# Patient Record
Sex: Male | Born: 1940
Health system: Southern US, Community
[De-identification: ages and names within clinical notes are randomized; demographics above are authoritative.]

## PROBLEM LIST (undated history)

## (undated) DIAGNOSIS — K635 Polyp of colon: Secondary | ICD-10-CM

## (undated) DIAGNOSIS — M1990 Unspecified osteoarthritis, unspecified site: Secondary | ICD-10-CM

## (undated) DIAGNOSIS — H269 Unspecified cataract: Secondary | ICD-10-CM

## (undated) DIAGNOSIS — R079 Chest pain, unspecified: Secondary | ICD-10-CM

## (undated) DIAGNOSIS — E119 Type 2 diabetes mellitus without complications: Secondary | ICD-10-CM

## (undated) DIAGNOSIS — H9190 Unspecified hearing loss, unspecified ear: Secondary | ICD-10-CM

## (undated) DIAGNOSIS — E785 Hyperlipidemia, unspecified: Secondary | ICD-10-CM

## (undated) DIAGNOSIS — K449 Diaphragmatic hernia without obstruction or gangrene: Secondary | ICD-10-CM

## (undated) DIAGNOSIS — D126 Benign neoplasm of colon, unspecified: Secondary | ICD-10-CM

## (undated) DIAGNOSIS — I10 Essential (primary) hypertension: Secondary | ICD-10-CM

## (undated) DIAGNOSIS — K589 Irritable bowel syndrome without diarrhea: Secondary | ICD-10-CM

## (undated) DIAGNOSIS — J189 Pneumonia, unspecified organism: Secondary | ICD-10-CM

## (undated) DIAGNOSIS — K210 Gastro-esophageal reflux disease with esophagitis: Secondary | ICD-10-CM

## (undated) DIAGNOSIS — W57XXXA Bitten or stung by nonvenomous insect and other nonvenomous arthropods, initial encounter: Secondary | ICD-10-CM

## (undated) DIAGNOSIS — N200 Calculus of kidney: Secondary | ICD-10-CM

## (undated) HISTORY — DX: Hyperlipidemia, unspecified: E78.5

## (undated) HISTORY — PX: COLONOSCOPY: SHX174

## (undated) HISTORY — DX: Benign neoplasm of colon, unspecified: D12.6

## (undated) HISTORY — DX: Unspecified osteoarthritis, unspecified site: M19.90

## (undated) HISTORY — DX: Calculus of kidney: N20.0

## (undated) HISTORY — PX: FLEXIBLE SIGMOIDOSCOPY: SHX1649

## (undated) HISTORY — DX: Irritable bowel syndrome, unspecified: K58.9

## (undated) HISTORY — DX: Polyp of colon: K63.5

## (undated) HISTORY — DX: Unspecified cataract: H26.9

## (undated) HISTORY — DX: Bitten or stung by nonvenomous insect and other nonvenomous arthropods, initial encounter: W57.XXXA

## (undated) HISTORY — PX: KNEE SURGERY: SHX244

## (undated) HISTORY — DX: Pneumonia, unspecified organism: J18.9

## (undated) HISTORY — DX: Unspecified hearing loss, unspecified ear: H91.90

## (undated) HISTORY — DX: Gastro-esophageal reflux disease with esophagitis: K21.0

## (undated) HISTORY — DX: Type 2 diabetes mellitus without complications: E11.9

## (undated) HISTORY — DX: Essential (primary) hypertension: I10

## (undated) HISTORY — DX: Diaphragmatic hernia without obstruction or gangrene: K44.9

## (undated) HISTORY — PX: TONSILLECTOMY: SUR1361

---

## 1968-06-03 DIAGNOSIS — N200 Calculus of kidney: Secondary | ICD-10-CM

## 1968-06-03 HISTORY — DX: Calculus of kidney: N20.0

## 1994-06-03 HISTORY — PX: ESOPHAGEAL DILATION: SHX303

## 1998-06-09 ENCOUNTER — Other Ambulatory Visit: Admission: RE | Admit: 1998-06-09 | Discharge: 1998-06-09 | Payer: Self-pay | Admitting: Urology

## 2001-06-03 HISTORY — PX: ESOPHAGEAL DILATION: SHX303

## 2002-02-26 ENCOUNTER — Inpatient Hospital Stay (HOSPITAL_COMMUNITY): Admission: EM | Admit: 2002-02-26 | Discharge: 2002-02-28 | Payer: Self-pay | Admitting: Emergency Medicine

## 2002-04-21 ENCOUNTER — Ambulatory Visit (HOSPITAL_COMMUNITY): Admission: RE | Admit: 2002-04-21 | Discharge: 2002-04-21 | Payer: Self-pay | Admitting: Gastroenterology

## 2002-04-21 ENCOUNTER — Encounter: Payer: Self-pay | Admitting: Gastroenterology

## 2003-12-18 ENCOUNTER — Encounter: Admission: RE | Admit: 2003-12-18 | Discharge: 2003-12-18 | Payer: Self-pay | Admitting: Family Medicine

## 2004-04-11 ENCOUNTER — Ambulatory Visit: Payer: Self-pay | Admitting: Cardiology

## 2004-04-25 ENCOUNTER — Ambulatory Visit: Payer: Self-pay | Admitting: Gastroenterology

## 2004-07-09 ENCOUNTER — Ambulatory Visit (HOSPITAL_COMMUNITY): Admission: RE | Admit: 2004-07-09 | Discharge: 2004-07-09 | Payer: Self-pay | Admitting: Internal Medicine

## 2004-07-09 ENCOUNTER — Ambulatory Visit: Payer: Self-pay | Admitting: Internal Medicine

## 2004-07-09 ENCOUNTER — Encounter (INDEPENDENT_AMBULATORY_CARE_PROVIDER_SITE_OTHER): Payer: Self-pay | Admitting: *Deleted

## 2004-07-09 DIAGNOSIS — K2100 Gastro-esophageal reflux disease with esophagitis, without bleeding: Secondary | ICD-10-CM

## 2004-07-09 HISTORY — DX: Gastro-esophageal reflux disease with esophagitis, without bleeding: K21.00

## 2005-01-25 ENCOUNTER — Ambulatory Visit: Payer: Self-pay | Admitting: Internal Medicine

## 2005-02-06 ENCOUNTER — Ambulatory Visit (HOSPITAL_COMMUNITY): Admission: RE | Admit: 2005-02-06 | Discharge: 2005-02-06 | Payer: Self-pay | Admitting: Internal Medicine

## 2005-02-06 ENCOUNTER — Encounter (INDEPENDENT_AMBULATORY_CARE_PROVIDER_SITE_OTHER): Payer: Self-pay | Admitting: Specialist

## 2005-02-06 ENCOUNTER — Ambulatory Visit: Payer: Self-pay | Admitting: Internal Medicine

## 2005-02-19 ENCOUNTER — Ambulatory Visit: Payer: Self-pay | Admitting: Internal Medicine

## 2005-07-12 ENCOUNTER — Ambulatory Visit: Payer: Self-pay | Admitting: Internal Medicine

## 2005-07-24 ENCOUNTER — Ambulatory Visit (HOSPITAL_COMMUNITY): Admission: RE | Admit: 2005-07-24 | Discharge: 2005-07-24 | Payer: Self-pay | Admitting: Internal Medicine

## 2005-07-24 ENCOUNTER — Ambulatory Visit: Payer: Self-pay | Admitting: Internal Medicine

## 2005-08-01 ENCOUNTER — Encounter (INDEPENDENT_AMBULATORY_CARE_PROVIDER_SITE_OTHER): Payer: Self-pay | Admitting: Specialist

## 2005-08-01 ENCOUNTER — Inpatient Hospital Stay (HOSPITAL_COMMUNITY): Admission: RE | Admit: 2005-08-01 | Discharge: 2005-08-06 | Payer: Self-pay | Admitting: Surgery

## 2005-08-01 HISTORY — PX: HEMICOLECTOMY: SHX854

## 2007-02-12 ENCOUNTER — Ambulatory Visit: Payer: Self-pay | Admitting: Internal Medicine

## 2007-09-14 DIAGNOSIS — D126 Benign neoplasm of colon, unspecified: Secondary | ICD-10-CM

## 2007-09-14 DIAGNOSIS — E785 Hyperlipidemia, unspecified: Secondary | ICD-10-CM

## 2007-09-14 DIAGNOSIS — N200 Calculus of kidney: Secondary | ICD-10-CM

## 2007-09-14 DIAGNOSIS — K449 Diaphragmatic hernia without obstruction or gangrene: Secondary | ICD-10-CM | POA: Insufficient documentation

## 2007-09-14 DIAGNOSIS — K219 Gastro-esophageal reflux disease without esophagitis: Secondary | ICD-10-CM

## 2008-04-15 ENCOUNTER — Encounter: Payer: Self-pay | Admitting: Internal Medicine

## 2008-07-15 ENCOUNTER — Ambulatory Visit: Payer: Self-pay | Admitting: Internal Medicine

## 2008-08-09 ENCOUNTER — Ambulatory Visit: Payer: Self-pay | Admitting: Internal Medicine

## 2008-08-25 ENCOUNTER — Encounter: Payer: Self-pay | Admitting: Internal Medicine

## 2010-04-11 ENCOUNTER — Ambulatory Visit: Payer: Self-pay | Admitting: Cardiology

## 2010-04-11 DIAGNOSIS — R9430 Abnormal result of cardiovascular function study, unspecified: Secondary | ICD-10-CM | POA: Insufficient documentation

## 2010-04-11 DIAGNOSIS — I4510 Unspecified right bundle-branch block: Secondary | ICD-10-CM

## 2010-04-18 ENCOUNTER — Telehealth (INDEPENDENT_AMBULATORY_CARE_PROVIDER_SITE_OTHER): Payer: Self-pay

## 2010-04-19 ENCOUNTER — Encounter: Payer: Self-pay | Admitting: Cardiology

## 2010-04-19 ENCOUNTER — Encounter: Payer: Self-pay | Admitting: Cardiovascular Disease

## 2010-04-19 ENCOUNTER — Ambulatory Visit: Payer: Self-pay | Admitting: Cardiology

## 2010-04-19 ENCOUNTER — Encounter (HOSPITAL_COMMUNITY)
Admission: RE | Admit: 2010-04-19 | Discharge: 2010-06-02 | Payer: Self-pay | Source: Home / Self Care | Attending: Cardiology | Admitting: Cardiology

## 2010-04-19 ENCOUNTER — Ambulatory Visit: Payer: Self-pay

## 2010-06-24 ENCOUNTER — Encounter: Payer: Self-pay | Admitting: Family Medicine

## 2010-07-03 NOTE — Progress Notes (Signed)
Summary: Nuc. Pre-Procedure  Phone Note Outgoing Call Call back at [facility] Phone [phone]   Call placed by: Irean Hong, RN,  April 18, 2010 2:07 PM Summary of Call: Left message with information on Myoview Information Sheet (see scanned document for details).      Nuclear Med Background Indications for Stress Test: Evaluation for Ischemia, Abnormal EKG  Indications Comments: New RBBB.   History Comments: 03-07-10:Abnormal GXT (WRFP).     Nuclear Pre-Procedure Cardiac Risk Factors: Family History - CAD, History of Smoking, Lipids, RBBB Height (in): 69

## 2010-07-03 NOTE — Assessment & Plan Note (Signed)
Summary: Cardiology Nuclear Testing  Nuclear Med Background Indications for Stress Test: Evaluation for Ischemia, Abnormal EKG  Indications Comments: New RBBB. Abnormal GXT.  History: GXT, Myocardial Perfusion Study  History Comments: 5-6 yrs. ago MPS:OK per patient; 03/07/10 [id number] (WRFP)   Symptoms Comments: No cardiac complaints   Nuclear Pre-Procedure Cardiac Risk Factors: Family History - CAD, History of Smoking, Hypertension, Lipids, RBBB Caffeine/Decaff Intake: None NPO After: 7:00 PM Lungs: Clear.   IV 0.9% NS with Angio Cath: 20g     IV Site: R Antecubital IV Started by: Stanton Kidney, EMT-P Chest Size (in) 42     Height (in): 69 Weight (lb): 188 BMI: 27.86  Nuclear Med Study 1 or 2 day study:  1 day     Stress Test Type:  Stress Reading MD:  Olga Millers, MD     Referring MD:  Rollene Rotunda, MD Resting Radionuclide:  Technetium 12m Tetrofosmin     Resting Radionuclide Dose:  11 mCi  Stress Radionuclide:  Technetium 81m Tetrofosmin     Stress Radionuclide Dose:  33 mCi   Stress Protocol Exercise Time (min):  11:30 min     Max HR:  134 bpm     Predicted Max HR:  152 bpm  Max Systolic BP: 192 mm Hg     Percent Max HR:  88.16 %     METS: 13.7 Rate Pressure Product:  98119    Stress Test Technologist:  Rea College, CMA-N     Nuclear Technologist:  Doyne Keel, CNMT  Rest Procedure  Myocardial perfusion imaging was performed at rest 45 minutes following the intravenous administration of Technetium 78m Tetrofosmin.  Stress Procedure  The patient exercised for 11:30.  The patient stopped due to fatigue and denied any chest pain.  There were no diagnostic ST-T wave changes, only rare PAC's.  Technetium 55m Tetrofosmin was injected at peak exercise and myocardial perfusion imaging was performed after a brief delay.  QPS Raw Data Images:  Acquisition technically good; normal left ventricular size. Stress Images:  There is decreased uptake in the apex. Rest  Images:  There is decreased uptake in the apex. Subtraction (SDS):  No evidence of ischemia. Transient Ischemic Dilatation:  1.0  (Normal <1.22)  Lung/Heart Ratio:  .28  (Normal <0.45)  Quantitative Gated Spect Images QGS EDV:  120 ml QGS ESV:  52 ml QGS EF:  57 % QGS cine images:  Normal wall motion.   Overall Impression  Exercise Capacity: Excellent exercise capacity. BP Response: Normal blood pressure response. Clinical Symptoms: No chest pain ECG Impression: No significant ST segment change suggestive of ischemia. Overall Impression: Normal stress nuclear study with mild apical thinning but no ischemia.  Appended Document: Cardiology Nuclear Testing Normal.  No further study is needed.  Appended Document: Cardiology Nuclear Testing PT AWARE./CY

## 2010-07-03 NOTE — Assessment & Plan Note (Signed)
Summary: [facility]  Medications Added VITAMIN D 1000 UNIT TABS (CHOLECALCIFEROL) 1 by mouth daily DIOVAN HCT 320-25 MG TABS (VALSARTAN-HYDROCHLOROTHIAZIDE) 1/2 by mouth daily SIMCOR 500-40 MG XR24H-TAB (NIACIN-SIMVASTATIN) 1 by mouth daily ASPIRIN 81 MG  TABS (ASPIRIN) 1 by mouth daily MELATONIN 5 MG TABS (MELATONIN) 1 by mouth daily      Allergies Added: NKDA  Visit Type:  Initial Consult Primary Provider:  Dr. Vernon Prey  CC:  Abnormal ETT.  History of Present Illness: The patient presents for evaluation of an abnormal exercise treadmill test. He has no prior cardiac history but significant cardiovascular risk factors. Recently he had a screening treadmill test. At baseline he was noted to have new right bundle branch block. He did have some baseline T wave inversion as can be expected in the anterior leads. However, peak exercise this did worsen with deepening ST depression and T-wave inversion. He was able to exercise for 9 minutes. There was no chest pain. He otherwise has been active and denies any chest pressure, neck or arm discomfort. He does not report shortness of breath, PND or orthopnea. He has not had palpitations, presyncope or syncope.  Current Medications (verified): 1)  Nexium 40 Mg  Cpdr (Esomeprazole Magnesium) .Marland Kitchen.. 1 Capsule Each Day 30 Minutes Before Meal  Must Have Office Appt For More Refills 2)  Vitamin D 1000 Unit Tabs (Cholecalciferol) .Marland Kitchen.. 1 By Mouth Daily 3)  Diovan Hct 320-25 Mg Tabs (Valsartan-Hydrochlorothiazide) .... 1/2 By Mouth Daily 4)  Simcor 500-40 Mg Xr24h-Tab (Niacin-Simvastatin) .Marland Kitchen.. 1 By Mouth Daily 5)  Aspirin 81 Mg  Tabs (Aspirin) .Marland Kitchen.. 1 By Mouth Daily 6)  Melatonin 5 Mg Tabs (Melatonin) .Marland Kitchen.. 1 By Mouth Daily  Allergies (verified): No Known Drug Allergies  Past History:  Past Medical History: NEPHROLITHIASIS (ICD-592.0) GERD (ICD-530.81) DYSLIPIDEMIA (ICD-272.4)  TUBULOVILLOUS ADENOMA, COLON (ICD-211.3) HIATAL HERNIA  (ICD-553.3) ESOPHAGITIS, REFLUX (ICD-530.11)  Past Surgical History: Reviewed history from 09/14/2007 and no changes required. Tonsillectomy Laparoscopic Resection of colon polyp with Right Hemicolectomy 3/07 Esophageal dilation 1996, 2003  Family History:  His brother had CABG in his 30s died 30.  Father CVA age 8.    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 He has not used tobacco products for the past 20 years. He reports no excessive use of alcohol.  Review of Systems       As stated in the HPI and negative for all other systems.   Vital Signs:  Patient profile:   70 year old male Height:      69 inches Weight:      191 pounds BMI:     28.31 Pulse rate:   55 / minute Resp:     16 per minute BP sitting:   126 / 60  (right arm)  Vitals Entered By: Marrion Coy, CNA (April 11, 2010 2:44 PM)  Nutrition Counseling: Patient's BMI is greater than 25 and therefore counseled on weight management options.  Physical Exam  General:  Well developed, well nourished, in no acute distress. Head:  normocephalic and atraumatic Eyes:  PERRLA/EOM intact; conjunctiva and lids normal. Mouth:  Teeth, gums and palate normal. Oral mucosa normal. Neck:  Neck supple, no JVD. No masses, thyromegaly or abnormal cervical nodes. Chest Wall:  no deformities  Lungs:  Clear bilaterally to auscultation and percussion. Abdomen:  Bowel sounds positive; abdomen soft and non-tender without masses, organomegaly, or hernias noted. No hepatosplenomegaly. Msk:  Back normal, normal  gait. Muscle strength and tone normal. Extremities:  No clubbing or cyanosis. Neurologic:  Alert and oriented x 3. Skin:  Intact without lesions or rashes. Cervical Nodes:  no significant adenopathy Axillary Nodes:  no significant adenopathy Inguinal Nodes:  no significant adenopathy Psych:  Normal affect.   Detailed Cardiovascular Exam  Neck    Carotids: Carotids full and  equal bilaterally without bruits.      Neck Veins: Normal, no JVD.    Heart    Inspection: no deformities or lifts noted.      Palpation: normal PMI with no thrills palpable.      Auscultation: regular rate and rhythm, S1, S2 without murmurs, rubs, gallops, or clicks.    Vascular    Abdominal Aorta: no palpable masses, pulsations, or audible bruits.      Femoral Pulses: normal femoral pulses bilaterally.      Pedal Pulses: normal pedal pulses bilaterally.      Radial Pulses: normal radial pulses bilaterally.      Peripheral Circulation: no clubbing, cyanosis, or edema noted with normal capillary refill.     EKG  Procedure date:  04/11/2010  Findings:      Sinus rhythm, rate 55, right bundle branch block, she ST-T wave changes  Impression & Recommendations:  Problem # 1:  CARDIOVASCULAR FUNCTION STUDY, ABNORMAL (ICD-794.30) Patient does have abnormal study as described. I cannot exclude obstructive coronary disease based on this study. Therefore, perfusion imaging is indicated. Orders: EKG w/ Interpretation (93000) Nuclear Stress Test (Nuc Stress Test)  Problem # 2:  DYSLIPIDEMIA (ICD-272.4) I reviewed his lipid profile. His HDL is low. Dr. Christell Constant had consider niacin and Crestor to replace the SimCor. This would be appropriate and I would defer to his management.  Problem # 3:  RBBB (ICD-426.4) This will be evaluated as above. He has no syncope or other symptoms. No further evaluation would be warranted.  Patient Instructions: 1)  Your physician recommends that you schedule a follow-up appointment after stress test 2)  Your physician recommends that you continue on your current medications as directed. Please refer to the Current Medication list given to you today. 3)  Your physician has requested that you have an exercise stress myoview.  For further information please visit [email or web address]  Please follow instruction sheet, as given.

## 2010-10-16 NOTE — Assessment & Plan Note (Signed)
[facility]                         GASTROENTEROLOGY OFFICE NOTE   [name], Joe                        MRN:          [id number]  DATE:02/12/2007                            DOB:          April 01, 1941    CHIEF COMPLAINT:  Followup of colon polyps.   PROBLEMS:  1. Adenomatous colon polyps, one tubulovillous adenoma unresectable by      colonoscopy, eventually had laparoscopic resection March 2007 (Dr.      Ezzard Standing) with right hemicolectomy.  2. Dyslipidemia.  3. Gastroesophageal reflux disease.  4. Nephrolithiasis.  5. Tonsillectomy.  6. Prior esophageal dilation in 1996 and 2003.  7. Hiatal hernia.   [name] though he was due for a colonoscopy.  He comes with a normal  CBC and C-MET, though his glucose is 107.  These were labs from Dr.  Kathi Der office in August 2008.  He really does not need another  colonoscopy until 2010, February 2010, which would be 3 years from the  time of his resection of his tubulovillous adenoma.  This is standard  followup for colon polyps of this type.  This is explained to the  patient and his wife and they are in agreement.   Note, weight 196 pounds, py 60, blood pressure 122/72.     Iva Boop, MD,FACG  Electronically Signed    CEG/MedQ  DD: 02/12/2007  DT: 02/12/2007  Job #: [id number]   cc:   Ernestina Penna, M.D.  Sandria Bales. Ezzard Standing, M.D.

## 2010-10-19 NOTE — H&P (Signed)
NAME:  Joe [name], Joe [name]                           ACCOUNT NO.:  [id number]   MEDICAL RECORD NO.:  [id number]                   PATIENT TYPE:  INP   LOCATION:  3702                                 FACILITY:  [facility]   PHYSICIAN:  Gerrit Friends. Dietrich Pates, M.D. [facility]        DATE OF BIRTH:  [birth year]   DATE OF ADMISSION:  02/26/2002  DATE OF DISCHARGE:  02/28/2002                                HISTORY & PHYSICAL   <REFERRING PHYSICIAN/>  Ernestina Penna, M.D.   HISTORY OF PRESENT ILLNESS:  The patient is a 70 year old gentleman with no  prior cardiac history who was referred for evaluation of chest pain. The  patient has never previously been evaluated by a cardiologist. He has had  stress tests on a number of occasions, most recently approximately two years  ago, in conjunction with annual physical examinations. This study apparently  was negative. He  has a remote history of cigarette smoking. He has a  significant family history for coronary artery disease, but other than age  and sex, no personal risk factors.   He has noted prolonged episodes of chest tightness with onset at rest,  occurring typically in the afternoon and persisting through the evening  until he  goes to sleep. There is no relationship to body position or  eating. He has tried to take a Zantac without improvement. The discomfort is  relatively mild, but does interfere with sleep at times. It was of slightly  greater intensity today, prompting him  to call  his physician; he expected  a referral for GI reevaluation.   PAST MEDICAL HISTORY:  Notable for multiple gastrointestinal problems,  including:  1. Gastritis.  2. Duodenitis.  3. IBS.  4. Umbilical hernia.  5. He has had nephrolithiasis with a transurethral removal.  6. There is a history of dyshydrosis.  7. DJD of the  spine.  8. Tinnitus.  9. BPH and increased PSA with negative biopsy.  10.      Impotence.   CURRENT MEDICATIONS:  1. Aspirin 81 mg q.d.  2.  Viagra p.r.n.  3. Ranitidine 300 mg q.d. p.r.n.  4. Benadryl 25 mg q.h.s. p.r.n.   ALLERGIES:  He reports no allergies.   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 He has not used tobacco products for the past 20 years. He reports  no excessive use of alcohol.   FAMILY HISTORY:  His brother had CABG in his 6s.   REVIEW OF SYSTEMS:  Notable for GERD.  He has symptoms. He notes that his  current chest discomfort is different than the one that he attributes to GI  etiology. All other systems negative.   PHYSICAL EXAMINATION:  GENERAL:  A pleasant, well-appearing gentleman.  VITAL SIGNS:  Temperature 97, heart rate 60 and regular, respirations 16,  blood pressure 150/75, O2 saturation 100% on room air.  HEENT:  Sclerae anicteric.  NECK:  No jugular venous  distention. No carotid bruits.  ENDOCRINE:  No thyromegaly.  HEMATOPOIETIC:  No adenopathy.  SKIN:  No significant lesions.  CHEST:  Few left basilar rales, mostly clear with cough.  CARDIAC:  Normal first and second heart sounds; fourth no significant murmur  appreciated.  ABDOMEN:  Soft, nontender; no bruits; no organomegaly.  NEUROMUSCULAR:  Symmetric strength and tone.  EXTREMITIES:  Normal distal pulses; no edema.   LABORATORY DATA:  Chest x-ray:  NAD.  An EKG:  Normal sinus rhythm; borderline left atrial abnormality; otherwise  normal.   Initial laboratory studies all normal, including CPK and troponin.   IMPRESSION:  A middle aged gentleman with relatively low cardiovascular risk  presents with atypical chest discomfort, a normal EKG and a negative exam.  The risk for coronary disease is relatively low. We will plan to proceed  with a stress Cardiolite study tomorrow. If negative, the patient will be  discharged to be treated empirically with increased dose of acid suppressant  medication. If symptoms persist, repeat GI evaluation will be requested.                                                Gerrit Friends. Dietrich Pates, M.D. [facility]    RMR/MEDQ  D:  02/26/2002  T:  03/01/2002  Job:  [id number]

## 2010-10-19 NOTE — Discharge Summary (Signed)
NAME:  Joe [name], Joe [name]                           ACCOUNT NO.:  [id number]   MEDICAL RECORD NO.:  [id number]                   PATIENT TYPE:  INP   LOCATION:  3702                                 FACILITY:  [facility]   PHYSICIAN:  Gerrit Friends. Dietrich Pates, M.D. [facility]        DATE OF BIRTH:  [birth year]   DATE OF ADMISSION:  02/26/2002  DATE OF DISCHARGE:  02/28/2002                           DISCHARGE SUMMARY - REFERRING   HISTORY OF PRESENT ILLNESS:  The patient is a 70 year old white male who was  referred from his primary MD's office for evaluation of new onset chest  discomfort.  He describes a three-week history of chest tightness which  occurs at rest.  He denies any exertion-related symptoms.  He has not had  any recent decreased energy or dyspnea on exertion.  Symptoms do not radiate  nor are they associated with shortness of breath or diaphoresis.  His  symptoms persist for hours.  He takes Zantac without affect.  He does feel  it is exacerbated by inspiration, movement, or change in position.  He feels  it is different from his heartburn symptoms.   History is notable for gastritis, duodenitis, nephrolithiasis, irritable  bowel syndrome, dyshydrosis, spinal DJD, tinnitus, BPH with increased PSA  and negative biopsy, impotence, umbilical hernia.  He quit smoking  approximately 18 years ago.  He also has a history of hyperlipidemia and  family history of coronary disease.   LABORATORY DATA:  CK and troponin negative for myocardial infarction.  Admission hemoglobin 14.5, hematocrit 41.0, normal indices, platelets 162,  WBC 5.9.  PT 13.3, PTT 29.  Sodium 138, potassium 4.1, BUN 12, creatinine  1.0.  Normal LFTs.  Fasting lipids showed a total cholesterol of 168,  triglycerides elevated at 423, HDL low at 24, LDL not calculated.  TSH 1.78.   Chest x-ray:  No active disease.   KEG showed normal sinus rhythm, no acute changes.   HOSPITAL COURSE:  The patient was admitted to the unit 3700.   Overnight, he  did not have any further chest discomfort and ruled out for myocardial  infarction.  Initial plan was to consider outpatient stress test; however,  with his risk factors, Dr. Graciela Husbands felt that he should remain for a stress  Cardiolite.  This was performed on 02/28/2002.  He ambulated for a total of  10 minutes and 34 seconds utilizing a Bruce protocol, achieving greater than  85% predicted maximum heart rate.  He complained of leg fatigue but no chest  discomfort.  EKG did show approximately 1 mm ST segment depression  laterally; however, imaging showed an EF of 59%  and no ischemia.  After  review, Dr. Graciela Husbands felt the patient could be discharged home.   DISCHARGE DIAGNOSES:  1. Noncardiac chest discomfort.  2. Hyperlipidemia.  3. History as previously.   DISPOSITION:  He is discharged home.   DISCHARGE MEDICATIONS:  He received a new prescription for  Protonix 40 mg  and was instructed to take this every day.  He was encouraged to continue  his coated aspirin 81 q.d. and Benadryl and Viagra as needed.   ACTIVITY:  Not restricted.   DIET:  He was asked to maintain low-fat, low-salt, and low-cholesterol diet  particularly low carbohydrates, avoiding bread and sugars.   FOLLOW UP:  He will follow up with Dr. Christell Constant in the next couple of weeks.  He was instructed to discuss with Dr. Christell Constant consideration of a weight loss  diet program as well as a prescription for hypertriglyceridemia and low HDL.      Joellyn Rued, P.A. [facility]                    Gerrit Friends. Dietrich Pates, M.D. [facility]    EW/MEDQ  D:  02/28/2002  T:  03/02/2002  Job:  [id number]   cc:   Ernestina Penna, M.D.  [address] [place]  [place] [address]  Fax: [phone]

## 2010-10-19 NOTE — Op Note (Signed)
Joe [name], Joe [name]                 ACCOUNT NO.:  [id number]   MEDICAL RECORD NO.:  [id number]          PATIENT TYPE:  INP   LOCATION:  5736                         FACILITY:  [facility]   PHYSICIAN:  Sandria Bales. Ezzard Standing, M.D.  DATE OF BIRTH:  May 16, 1941   DATE OF PROCEDURE:  08/01/2005  DATE OF DISCHARGE:                                 OPERATIVE REPORT   PREOPERATIVE DIAGNOSIS:  Hepatic flexure colonic polyp.   POSTOPERATIVE DIAGNOSIS:  Hepatic flexure colonic polyp.   OPERATION PERFORMED:  Laparoscopically assisted right hemicolectomy.   SURGEON:  Sandria Bales. Ezzard Standing, M.D.   ASSISTANT:  Currie Paris, M.D.   ANESTHESIA:  General endotracheal.   ESTIMATED BLOOD LOSS:  100 to 150 mL.   DRAINS:  None.   INDICATIONS FOR PROCEDURE:  Mr. Law is a 70 year old white male who  underwent a colonoscopy by Dr. Leone Payor.  He was found to have a polyp at his  hepatic flexure.  The polyp was not felt to be amenable to colonoscopic  resection and it is felt that the patient would be best served with a  segmental colectomy.   Dr. Leone Payor has both marked the polyp with dye and he has done a colonoscopy  and obtained a KUB identifying the polyp at the tip of the colonoscope at  the hepatic flexure.   The patient has completed a mechanical and antibiotic bowel prep and now  comes for a laparoscopically assisted right hemicolectomy.  The indications  and potential complications were explained to the patient.  Potential  complications include but are not limited to bleeding, infection, leak from  the bowel, possibility of invasive carcinoma.   DESCRIPTION OF PROCEDURE:  The patient was placed in a supine position,  given a general endotracheal anesthetic, his left arm was tucked by his  side.  A Foley catheter was then placed.  He was given 1 gm of Cefoxitin at  initiation of the procedure.  He had an orogastric tube placed.   The abdomen was shaved, prepped with Betadine solution and sterilely  draped.  I accessed the abdominal cavity through an infraumbilical  hernia which we  repaired during the surgery.  A 10 mm 0 degree laparoscope was inserted  through a 12 mm Hasson trocar and this Hasson trocar secured with a 0 Vicryl  suture.  Four additional trocars were placed, right upper quadrant, right  lower quadrant, left upper quadrant, left lower quadrant, 10 mm trocars.   Attention was first turned to the appendix.  I mobilized the cecum and right  colon. I stayed well anterior to the ureter, never did really see the ureter  but I thought I was mobilizing the colon up anterior to that.  I got up to  the duodenal sweep which was identified and left behind.  I then turned the  corner.  I actually saw evidence of the dye in the wall of the colon right  at the hepatic flexure.  I was able to mobilize the colon coming back over  the duodenum.  I thought I mobilized at least 10 to  15 cm of the right  transverse colon, so I thought I had pretty good mobilization of the  terminal ileum, right colon, right transverse colon.  I then removed the  umbilical port and made a midline incision, used the Gel-Port attachment as  a wound protector and I pulled the right colon and the transverse colon  through this incision.   I did have a small bleed up over the pancreas.  I was able to identify this,  localize it.  I put a stitch in it and placed some metal clips in this  bleeding area.  I then divided the terminal ileum with an Endo GIA stapler  #70.  I divided the right transverse colon with a 70 GIA stapler.  I thought  I was a good 5 to 8 cm proximal to where the dye was.  I then removed the  right colon using Kelly clamps and tying the mesentery with 2-0 silk  sutures.   After the entire colon was out, I then opened the colon on the bedside table  and identified a flat polyp which was maybe 2.5 to 3 cm in size at the  hepatic flexure as marked by Dr. Leone Payor.  I think we did get in our   specimen the suspicious mass out.  I then did a stapled side-to-side  anastomosis with a 70 GIA stapler.  I closed the enterotomy with interrupted  2-0 silk sutures.  I placed a stitch at __________ , tried to close the  mesentery down to the duodenum bringing the terminal ileum together with the  right transverse colon.   After doing the side-to-side stapled anastomosis, I closed the enterotomy  with interrupted 2-0 silk sutures.  Again I closed the mesentery with 2-0  silk sutures.  I had a wide open anastomosis which was probably 4 or 5 cm in  diameter.  There was no evidence of any compromise of the bowel or no  evidence of any leaking.  I buttressed this with some two silk sutures and  then buried this under the omentum.   I then reduced the anastomosis back into the abdominal cavity.  I irrigated  the abdomen with about 3 or 4 L of saline and saw no significant bleeding.  I then closed the abdominal wound with two running #1 PDS sutures.  I threw  in some interrupted #1 PDS sutures also.  After complete closure of the  abdominal wall, I then stapled the skin.  Again I had gone through this  hernia defect at the umbilicus, so therefore, I repaired the hernia by  definition in closing the abdominal cavity.   I then placed a 5 mm scope in again. I irrigated out the abdomen with  another liter of saline, so no evidence of any active bleeding, the  anastomosis lay well on the right side of the colon.  I covered this with  omentum.  I then removed the 5 mm trocars under direct visualization.  There  was no bleeding at any trocar site.  Again the skin at each trocar site was  closed with a skin staple as was the midline incision.  The wound was then  sterilely dressed.   The patient was transported to recovery room in good condition.  Sponge and needle counts were correct at the end of this case. Specimen was sent to  pathology.      Sandria Bales. Ezzard Standing, M.D.  Electronically  Signed     DHN/MEDQ  D:  08/01/2005  T:  08/02/2005  Job:  [id number]   cc:   Iva Boop, M.D. [facility], [facility]  [address] [place], [place] [address]   Ernestina Penna, M.D.  Fax: [phone]

## 2011-01-08 ENCOUNTER — Encounter: Payer: Self-pay | Admitting: Cardiology

## 2011-07-29 DIAGNOSIS — E785 Hyperlipidemia, unspecified: Secondary | ICD-10-CM | POA: Diagnosis not present

## 2011-07-29 DIAGNOSIS — E559 Vitamin D deficiency, unspecified: Secondary | ICD-10-CM | POA: Diagnosis not present

## 2011-07-29 DIAGNOSIS — I10 Essential (primary) hypertension: Secondary | ICD-10-CM | POA: Diagnosis not present

## 2011-07-29 DIAGNOSIS — N400 Enlarged prostate without lower urinary tract symptoms: Secondary | ICD-10-CM | POA: Diagnosis not present

## 2011-07-29 DIAGNOSIS — R109 Unspecified abdominal pain: Secondary | ICD-10-CM | POA: Diagnosis not present

## 2011-10-16 DIAGNOSIS — K219 Gastro-esophageal reflux disease without esophagitis: Secondary | ICD-10-CM | POA: Diagnosis not present

## 2011-10-16 DIAGNOSIS — E559 Vitamin D deficiency, unspecified: Secondary | ICD-10-CM | POA: Diagnosis not present

## 2011-10-16 DIAGNOSIS — K449 Diaphragmatic hernia without obstruction or gangrene: Secondary | ICD-10-CM | POA: Diagnosis not present

## 2011-10-16 DIAGNOSIS — I10 Essential (primary) hypertension: Secondary | ICD-10-CM | POA: Diagnosis not present

## 2011-10-16 DIAGNOSIS — E785 Hyperlipidemia, unspecified: Secondary | ICD-10-CM | POA: Diagnosis not present

## 2011-10-18 DIAGNOSIS — Z125 Encounter for screening for malignant neoplasm of prostate: Secondary | ICD-10-CM | POA: Diagnosis not present

## 2011-11-05 DIAGNOSIS — R7989 Other specified abnormal findings of blood chemistry: Secondary | ICD-10-CM | POA: Diagnosis not present

## 2012-01-08 DIAGNOSIS — I10 Essential (primary) hypertension: Secondary | ICD-10-CM | POA: Diagnosis not present

## 2012-01-08 DIAGNOSIS — M545 Low back pain: Secondary | ICD-10-CM | POA: Diagnosis not present

## 2012-02-07 DIAGNOSIS — I10 Essential (primary) hypertension: Secondary | ICD-10-CM | POA: Diagnosis not present

## 2012-02-07 DIAGNOSIS — E559 Vitamin D deficiency, unspecified: Secondary | ICD-10-CM | POA: Diagnosis not present

## 2012-02-07 DIAGNOSIS — E039 Hypothyroidism, unspecified: Secondary | ICD-10-CM | POA: Diagnosis not present

## 2012-02-07 DIAGNOSIS — E785 Hyperlipidemia, unspecified: Secondary | ICD-10-CM | POA: Diagnosis not present

## 2012-02-07 DIAGNOSIS — Z125 Encounter for screening for malignant neoplasm of prostate: Secondary | ICD-10-CM | POA: Diagnosis not present

## 2012-03-10 DIAGNOSIS — R972 Elevated prostate specific antigen [PSA]: Secondary | ICD-10-CM | POA: Diagnosis not present

## 2012-03-10 DIAGNOSIS — C61 Malignant neoplasm of prostate: Secondary | ICD-10-CM | POA: Diagnosis not present

## 2012-03-10 DIAGNOSIS — Z23 Encounter for immunization: Secondary | ICD-10-CM | POA: Diagnosis not present

## 2012-03-10 DIAGNOSIS — N401 Enlarged prostate with lower urinary tract symptoms: Secondary | ICD-10-CM | POA: Diagnosis not present

## 2012-03-10 DIAGNOSIS — I861 Scrotal varices: Secondary | ICD-10-CM | POA: Diagnosis not present

## 2012-06-10 DIAGNOSIS — H25019 Cortical age-related cataract, unspecified eye: Secondary | ICD-10-CM | POA: Diagnosis not present

## 2012-06-10 DIAGNOSIS — H2510 Age-related nuclear cataract, unspecified eye: Secondary | ICD-10-CM | POA: Diagnosis not present

## 2012-07-07 DIAGNOSIS — B078 Other viral warts: Secondary | ICD-10-CM | POA: Diagnosis not present

## 2012-07-14 DIAGNOSIS — M2040 Other hammer toe(s) (acquired), unspecified foot: Secondary | ICD-10-CM | POA: Diagnosis not present

## 2012-07-14 DIAGNOSIS — M79609 Pain in unspecified limb: Secondary | ICD-10-CM | POA: Diagnosis not present

## 2012-07-14 DIAGNOSIS — L851 Acquired keratosis [keratoderma] palmaris et plantaris: Secondary | ICD-10-CM | POA: Diagnosis not present

## 2012-08-06 DIAGNOSIS — E559 Vitamin D deficiency, unspecified: Secondary | ICD-10-CM | POA: Diagnosis not present

## 2012-08-06 DIAGNOSIS — E291 Testicular hypofunction: Secondary | ICD-10-CM | POA: Diagnosis not present

## 2012-08-06 DIAGNOSIS — R5381 Other malaise: Secondary | ICD-10-CM | POA: Diagnosis not present

## 2012-08-06 DIAGNOSIS — E785 Hyperlipidemia, unspecified: Secondary | ICD-10-CM | POA: Diagnosis not present

## 2012-08-06 DIAGNOSIS — R5383 Other fatigue: Secondary | ICD-10-CM | POA: Diagnosis not present

## 2012-08-06 DIAGNOSIS — F329 Major depressive disorder, single episode, unspecified: Secondary | ICD-10-CM | POA: Diagnosis not present

## 2012-08-06 DIAGNOSIS — I10 Essential (primary) hypertension: Secondary | ICD-10-CM | POA: Diagnosis not present

## 2012-08-24 ENCOUNTER — Ambulatory Visit (INDEPENDENT_AMBULATORY_CARE_PROVIDER_SITE_OTHER): Payer: Medicare Other | Admitting: Family Medicine

## 2012-08-24 ENCOUNTER — Encounter: Payer: Self-pay | Admitting: Family Medicine

## 2012-08-24 VITALS — BP 134/70 | HR 58 | Temp 97.1°F | Wt 194.0 lb

## 2012-08-24 DIAGNOSIS — S91309A Unspecified open wound, unspecified foot, initial encounter: Secondary | ICD-10-CM | POA: Diagnosis not present

## 2012-08-24 DIAGNOSIS — S91332A Puncture wound without foreign body, left foot, initial encounter: Secondary | ICD-10-CM

## 2012-08-24 MED ORDER — CEPHALEXIN 500 MG PO CAPS: 500.0000 mg | ORAL_CAPSULE | Freq: Three times a day (TID) | ORAL | Status: DC

## 2012-08-24 NOTE — Patient Instructions (Signed)
Continue continue to keep foot inspected and return to clinic if worsening redness Sent foot warm salty water Call me in 2 days for progress

## 2012-08-24 NOTE — Progress Notes (Signed)
  Subjective:    [name], male    DOB: 1940-11-12, 72 y.o.   MRN: [id number]  HPI Patient stepped on a nail 48 hours ago in the woods. Not sure of when last tetanus shot was given foot has been sore since incident occurred   Review of Systems  Skin: Positive for wound (bottom L foot with redness).       Objective:   Physical Exam Examining foot just reveals puncture wound with minimal redness and with minimal swelling       Assessment & Plan:  1. Puncture wound of foot, left, initial encounter  - cephALEXin (KEFLEX) 500 MG capsule; Take 1 capsule (500 mg total) by mouth 3 (three) times daily.  Dispense: 30 capsule; Refill: 1

## 2012-09-02 ENCOUNTER — Ambulatory Visit: Payer: Self-pay | Admitting: Family Medicine

## 2012-09-22 ENCOUNTER — Ambulatory Visit (INDEPENDENT_AMBULATORY_CARE_PROVIDER_SITE_OTHER): Payer: Medicare Other | Admitting: Pharmacist Clinician (PhC)/ Clinical Pharmacy Specialist

## 2012-09-22 VITALS — BP 115/66 | HR 55 | Resp 16 | Ht 69.0 in | Wt 184.0 lb

## 2012-09-22 DIAGNOSIS — E785 Hyperlipidemia, unspecified: Secondary | ICD-10-CM

## 2012-09-22 DIAGNOSIS — R7309 Other abnormal glucose: Secondary | ICD-10-CM

## 2012-09-22 DIAGNOSIS — R7303 Prediabetes: Secondary | ICD-10-CM

## 2012-09-22 NOTE — Progress Notes (Signed)
  Subjective:    [name], male    DOB: 05-07-1941, 72 y.o.   MRN: [id number]  HPI  New onset pre-diabetes  Diabetes Flow Sheet:  Visit 1  Chief Complaint:   Chief Complaint  Patient presents with  . Hyperglycemia     Exam Regularity:  RRR Edema:  neg  Polyuria:  pos  Polydipsia:  pos Polyphagia:  pos  BMI:  Body mass index is 27.16 kg/(m^2).   Weight changes:  Weight loss General Appearance:  well nourished Mood/Affect:  normal  Low fat/carbohydrate diet?  No Nicotine Abuse?  No Medication Compliance?  Yes Exercise?  Yes Alcohol Abuse?  No  Glucose Readings Causal plasma glucose reading > 200mg /dL:  409 mg/dL  No results found for this basename: HGBA1C    No results found for this basename: CHOL, HDL, LDLCALC, LDLDIRECT, TRIG, CHOLHDL     Medication Checklist: ACE Inhibitor/ARB?  Yes ipid Lowering Agent?  Yes Aspirin?  Yes Oral Hypoglycemic Agent(s)?  No  Assessment: 1.  type 2 Diabetes.  New diagnosed pre-diabetic but based on reading today diabetic 2.  Blood Pressure Control.  Excellent control 3.  Lipid Control.  LDL-P <1000  Recommendations: 1.  1500 calorie, carbohydrate counting diet.  Patient is counseled extensively on carbohydrate counting, serving sizes, saturated fat intake and meal planning.  Patient is instructed to eat 3 meals a day and 3 small snacks.  Patient will supplement snacks based on physical activity. 2.  45 minutes of physical activity.  Patient is counseled to always carry glucose tablets, lifesavers, hard candies, etc., while exercising in case of hypoglycemic event. 3.  Patient is counseled on pathophysiology of diabetes and the risk of long-term complications.  Fasting blood glucose goals are 80-120mg /dL.  Post-prandial goals are < 140.  A1C goals < 6.5%. 4.  LDL goal of < 100, HDL > 40 and TG < 150; BP goal < 130/80 5.  Patient is counseled on proper use of glucometer and lancing device.  Patient is informed how often to  test and how to respond to unsuitable results. 6.  Medication recommendations at this time are as follows:  Start Metformin 500mg  bid with food and change simcor to simvastatin 40mg  qhs.  Time spent counseling patient:  58  Physician time spent with patient:  0 Referring provider:  wong   PharmD:  [facility] Pharmacist            Review of Systems     Objective:   Physical Exam  Constitutional: He is oriented to person, place, and time. He appears well-developed and well-nourished.  Cardiovascular: Normal rate and regular rhythm.   Musculoskeletal: He exhibits no edema and no tenderness.  Neurological: He is alert and oriented to person, place, and time.  Psychiatric: He has a normal mood and affect. His behavior is normal. Judgment and thought content normal.

## 2012-09-24 ENCOUNTER — Ambulatory Visit: Payer: Medicare Other

## 2012-09-25 ENCOUNTER — Telehealth: Payer: Self-pay | Admitting: *Deleted

## 2012-09-25 NOTE — Telephone Encounter (Signed)
Pt called this am stating that bs was still running high. He states that his diet is controlled but he did have some liquor drinks last night, bs at bedtime and this am fasting was 241. Pt was advised to limit the alcohol over the weekend and document all bs's taken and f/u with dr Christell Constant on Monday. meds were updated in epic.

## 2012-09-28 ENCOUNTER — Ambulatory Visit (INDEPENDENT_AMBULATORY_CARE_PROVIDER_SITE_OTHER): Payer: Medicare Other | Admitting: Family Medicine

## 2012-09-28 ENCOUNTER — Encounter: Payer: Self-pay | Admitting: Family Medicine

## 2012-09-28 VITALS — BP 95/54 | HR 51 | Temp 97.6°F | Wt 188.6 lb

## 2012-09-28 DIAGNOSIS — W57XXXA Bitten or stung by nonvenomous insect and other nonvenomous arthropods, initial encounter: Secondary | ICD-10-CM

## 2012-09-28 DIAGNOSIS — E119 Type 2 diabetes mellitus without complications: Secondary | ICD-10-CM

## 2012-09-28 DIAGNOSIS — E1140 Type 2 diabetes mellitus with diabetic neuropathy, unspecified: Secondary | ICD-10-CM | POA: Insufficient documentation

## 2012-09-28 DIAGNOSIS — T148 Other injury of unspecified body region: Secondary | ICD-10-CM | POA: Diagnosis not present

## 2012-09-28 MED ORDER — DOXYCYCLINE HYCLATE 100 MG PO TABS: 100.0000 mg | ORAL_TABLET | Freq: Two times a day (BID) | ORAL | Status: DC

## 2012-09-28 NOTE — Progress Notes (Signed)
Subjective:    [name], male    DOB: 11-Aug-1940, 72 y.o.   MRN: [id number]  HPI Here regarding DM and questions about this  diet and meds. Also has deer tick bite, right posterior thigh His wife accompanies him  Review of Systems  Constitutional: Positive for fatigue (improving).  Eyes: Positive for visual disturbance.  Respiratory: Negative for chest tightness.        SOB after exertion and with exertion  Cardiovascular: Negative for chest pain.  Endocrine: Positive for polydipsia (improving) and polyuria.  Genitourinary: Positive for frequency.  Skin: Positive for rash (R back of leg, tick bite) and wound (L great toe).  Neurological: Positive for light-headedness. Negative for dizziness.       Objective:   Physical Exam  Constitutional: He is oriented to person, place, and time. He appears well-developed and well-nourished. No distress.  HENT:  Head: Normocephalic and atraumatic.  Neck: Normal range of motion. No thyromegaly present.  Cardiovascular: Normal rate, regular rhythm, normal heart sounds and intact distal pulses.  Exam reveals no gallop and no friction rub.   No murmur heard. Pulmonary/Chest: Effort normal and breath sounds normal. No respiratory distress. He has no wheezes. He has no rales. He exhibits no tenderness.  Abdominal: Soft. Bowel sounds are normal. He exhibits no distension. There is no tenderness. There is no rebound and no guarding.  Musculoskeletal: Normal range of motion. He exhibits no edema and no tenderness.  Lymphadenopathy:    He has no cervical adenopathy.  Neurological: He is alert and oriented to person, place, and time. He has normal reflexes. He displays normal reflexes.  Skin: Skin is warm and dry. There is erythema.  Slight redness at site of tick bite right post thigh  Psychiatric: He has a normal mood and affect. His behavior is normal. Judgment and thought content normal.   See vital  signs       Assessment &  Plan:  1. Diabetes type 2, controlled Now coming under better control Continue with diet and exercise and meds Reassure   2. Tick bite - doxycycline (VIBRA-TABS) 100 MG tablet; Take 1 tablet (100 mg total) by mouth 2 (two) times daily.  Dispense: 28 tablet; Refill: 0   Patient Instructions  Continue diet  and therapeutic lifestyle changes Monitor Blood  Sugars as directed See clinical pharmacist 4 weeks     Continue meds Deer Tick Bite Deer ticks are brown arachnids (spider family) that vary in size from as small as the head of a pin to 1/4 inch (1/2 cm) diameter. They thrive in wooded areas. Deer are the preferred host of adult deer ticks. Small rodents are the host of young ticks (nymphs). When a person walks in a field or wooded area, young and adult ticks in the surrounding grass and vegetation can attach themselves to the skin. They can suck blood for hours to days if unnoticed. Ticks are found all over the [place] Some ticks carry a specific bacteria (Borrelia burgdorferi) that causes an infection called Lyme disease. The bacteria is typically passed into a person during the blood sucking process. This happens after the tick has been attached for at least a number of hours. While ticks can be found all over the [place] those carrying the bacteria that causes Lyme disease are most common in [place] and the [place]. Only a small proportion of ticks in these areas carry the Lyme disease bacteria and cause human infections. Ticks usually attach to  warm spots on the body, such as the:  Head.  Back.  Neck.  Armpits.  Groin. SYMPTOMS  Most of the time, a deer tick bite will not be felt. You may or may not see the attached tick. You may notice mild irritation or redness around the bite site. If the deer tick passes the Lyme disease bacteria to a person, a round, red rash may be noticed 2 to 3 days after the bite. The rash may be clear in the middle, like a bull's-eye or target. If not  treated, other symptoms may develop several days to weeks after the onset of the rash. These symptoms may include:  New rash lesions.  Fatigue and weakness.  General ill feeling and achiness.  Chills.  Headache and neck pain.  Swollen lymph glands.  Sore muscles and joints. 5 to 15% of untreated people with Lyme disease may develop more severe illnesses after several weeks to months. This may include inflammation of the brain lining (meningitis), nerve palsies, an abnormal heartbeat, or severe muscle and joint pain and inflammation (myositis or arthritis). DIAGNOSIS   Physical exam and medical history.  Viewing the tick if it was saved for confirmation.  Blood tests (to check or confirm the presence of Lyme disease). TREATMENT  Most ticks do not carry disease. If found, an attached tick should be removed using tweezers. Tweezers should be placed under the body of the tick so it is removed by its attachment parts (pincers). If there are signs or symptoms of being sick, or Lyme disease is confirmed, medicines (antibiotics) that kill germs are usually prescribed. In more severe cases, antibiotics may be given through an intravenous (IV) access. HOME CARE INSTRUCTIONS   Always remove ticks with tweezers. Do not use petroleum jelly or other methods to kill or remove the tick. Slide the tweezers under the body and pull out as much as you can. If you are not sure what it is, save it in a jar and show your caregiver.  Once you remove the tick, the skin will heal on its own. Wash your hands and the affected area with water and soap. You may place a bandage on the affected area.  Take medicine as directed. You may be advised to take a full course of antibiotics.  Follow up with your caregiver as recommended. FINDING OUT THE RESULTS OF YOUR TEST Not all test results are available during your visit. If your test results are not back during the visit, make an appointment with your caregiver to  find out the results. Do not assume everything is normal if you have not heard from your caregiver or the medical facility. It is important for you to follow up on all of your test results. PROGNOSIS  If Lyme disease is confirmed, early treatment with antibiotics is very effective. Following preventive guidelines is important since it is possible to get the disease more than once. PREVENTION   Wear long sleeves and long pants in wooded or grassy areas. Tuck your pants into your socks.  Use an insect repellent while hiking.  Check yourself, your children, and your pets regularly for ticks after playing outside.  Clear piles of leaves or brush from your yard. Ticks might live there. SEEK MEDICAL CARE IF:   You or your child has an oral temperature above 102 F (38.9 C).  You develop a severe headache following the bite.  You feel generally ill.  You notice a rash.  You are having trouble removing  the tick.  The bite area has red skin or yellow drainage. SEEK IMMEDIATE MEDICAL CARE IF:   Your face is weak and droopy or you have other neurological symptoms.  You have severe joint pain or weakness. MAKE SURE YOU:   Understand these instructions.  Will watch your condition.  Will get help right away if you are not doing well or get worse. FOR MORE INFORMATION Centers for Disease Control and Prevention: FootballExhibition.com.br American Academy of Family Physicians: www.https://powers.com/ Document Released: 08/14/2009 Document Revised: 08/12/2011 Document Reviewed: 08/14/2009 [facility] Patient Information 2013 [place], [place].

## 2012-09-28 NOTE — Patient Instructions (Addendum)
Continue diet  and therapeutic lifestyle changes Monitor Blood  Sugars as directed See clinical pharmacist 4 weeks     Continue meds Deer Tick Bite Deer ticks are brown arachnids (spider family) that vary in size from as small as the head of a pin to 1/4 inch (1/2 cm) diameter. They thrive in wooded areas. Deer are the preferred host of adult deer ticks. Small rodents are the host of young ticks (nymphs). When a person walks in a field or wooded area, young and adult ticks in the surrounding grass and vegetation can attach themselves to the skin. They can suck blood for hours to days if unnoticed. Ticks are found all over the [place] Some ticks carry a specific bacteria (Borrelia burgdorferi) that causes an infection called Lyme disease. The bacteria is typically passed into a person during the blood sucking process. This happens after the tick has been attached for at least a number of hours. While ticks can be found all over the [place] those carrying the bacteria that causes Lyme disease are most common in [place] and the [place]. Only a small proportion of ticks in these areas carry the Lyme disease bacteria and cause human infections. Ticks usually attach to warm spots on the body, such as the:  Head.  Back.  Neck.  Armpits.  Groin. SYMPTOMS  Most of the time, a deer tick bite will not be felt. You may or may not see the attached tick. You may notice mild irritation or redness around the bite site. If the deer tick passes the Lyme disease bacteria to a person, a round, red rash may be noticed 2 to 3 days after the bite. The rash may be clear in the middle, like a bull's-eye or target. If not treated, other symptoms may develop several days to weeks after the onset of the rash. These symptoms may include:  New rash lesions.  Fatigue and weakness.  General ill feeling and achiness.  Chills.  Headache and neck pain.  Swollen lymph glands.  Sore muscles and joints. 5 to 15% of  untreated people with Lyme disease may develop more severe illnesses after several weeks to months. This may include inflammation of the brain lining (meningitis), nerve palsies, an abnormal heartbeat, or severe muscle and joint pain and inflammation (myositis or arthritis). DIAGNOSIS   Physical exam and medical history.  Viewing the tick if it was saved for confirmation.  Blood tests (to check or confirm the presence of Lyme disease). TREATMENT  Most ticks do not carry disease. If found, an attached tick should be removed using tweezers. Tweezers should be placed under the body of the tick so it is removed by its attachment parts (pincers). If there are signs or symptoms of being sick, or Lyme disease is confirmed, medicines (antibiotics) that kill germs are usually prescribed. In more severe cases, antibiotics may be given through an intravenous (IV) access. HOME CARE INSTRUCTIONS   Always remove ticks with tweezers. Do not use petroleum jelly or other methods to kill or remove the tick. Slide the tweezers under the body and pull out as much as you can. If you are not sure what it is, save it in a jar and show your caregiver.  Once you remove the tick, the skin will heal on its own. Wash your hands and the affected area with water and soap. You may place a bandage on the affected area.  Take medicine as directed. You may be advised to take a full  course of antibiotics.  Follow up with your caregiver as recommended. FINDING OUT THE RESULTS OF YOUR TEST Not all test results are available during your visit. If your test results are not back during the visit, make an appointment with your caregiver to find out the results. Do not assume everything is normal if you have not heard from your caregiver or the medical facility. It is important for you to follow up on all of your test results. PROGNOSIS  If Lyme disease is confirmed, early treatment with antibiotics is very effective. Following  preventive guidelines is important since it is possible to get the disease more than once. PREVENTION   Wear long sleeves and long pants in wooded or grassy areas. Tuck your pants into your socks.  Use an insect repellent while hiking.  Check yourself, your children, and your pets regularly for ticks after playing outside.  Clear piles of leaves or brush from your yard. Ticks might live there. SEEK MEDICAL CARE IF:   You or your child has an oral temperature above 102 F (38.9 C).  You develop a severe headache following the bite.  You feel generally ill.  You notice a rash.  You are having trouble removing the tick.  The bite area has red skin or yellow drainage. SEEK IMMEDIATE MEDICAL CARE IF:   Your face is weak and droopy or you have other neurological symptoms.  You have severe joint pain or weakness. MAKE SURE YOU:   Understand these instructions.  Will watch your condition.  Will get help right away if you are not doing well or get worse. FOR MORE INFORMATION Centers for Disease Control and Prevention: FootballExhibition.com.br American Academy of Family Physicians: www.https://powers.com/ Document Released: 08/14/2009 Document Revised: 08/12/2011 Document Reviewed: 08/14/2009 [facility] Patient Information 2013 [place], [place].

## 2012-10-05 ENCOUNTER — Telehealth: Payer: Self-pay | Admitting: *Deleted

## 2012-10-05 NOTE — Telephone Encounter (Signed)
confirm current treatment for blood pressure May have to take one half of a pill

## 2012-10-05 NOTE — Telephone Encounter (Signed)
Pt called in with the following BP readings :  95/53, 117/53, 108/54, 106/50, 124/49, 123/53, 114/45 Pt is experiencing dizziness with standing

## 2012-10-05 NOTE — Telephone Encounter (Signed)
Pt is currently taking Diovan/hct 320/25 1/2 tablet daily Per [facility] take 1/4 tablet and record BP readings Call back with readings or if symptoms worsen

## 2012-10-09 ENCOUNTER — Telehealth: Payer: Self-pay | Admitting: Family Medicine

## 2012-10-09 NOTE — Telephone Encounter (Signed)
Message left --no samples of test strips available

## 2012-10-14 ENCOUNTER — Telehealth: Payer: Self-pay | Admitting: Family Medicine

## 2012-10-14 ENCOUNTER — Encounter: Payer: Self-pay | Admitting: Physician Assistant

## 2012-10-14 ENCOUNTER — Ambulatory Visit (INDEPENDENT_AMBULATORY_CARE_PROVIDER_SITE_OTHER): Payer: Medicare Other | Admitting: Physician Assistant

## 2012-10-14 VITALS — BP 109/51 | HR 55 | Temp 97.9°F | Ht 69.0 in | Wt 186.0 lb

## 2012-10-14 DIAGNOSIS — M79609 Pain in unspecified limb: Secondary | ICD-10-CM | POA: Diagnosis not present

## 2012-10-14 NOTE — Progress Notes (Signed)
Subjective:     [name], male   DOB: 16-Apr-1941, 72 y.o.   MRN: [id number]  HPI Pt with redness and pain to both hands He denies any trauma but was mowing the lawn for an extended amt of time He states that the skin actually hurts and not the joint Denies any trauma Pt recently on Doxycycline for tick bite  Review of Systems  All other systems reviewed and are negative.       Objective:   Physical Exam Erythema tot he distal 2-3rd distal metacarpal area bilat FROM fingers w/o sx + TTP of the skin but not the joint No surrounding induration    Assessment:     Dermatitis    Plan:     Suspect rxn due to prolonged sun exposure and Doxycycline Keep areas sun protected Activities as tol F/U prn

## 2012-10-14 NOTE — Telephone Encounter (Signed)
appt given for 3:00 with Annette Stable

## 2012-10-27 ENCOUNTER — Ambulatory Visit (INDEPENDENT_AMBULATORY_CARE_PROVIDER_SITE_OTHER): Payer: Medicare Other | Admitting: Pharmacist Clinician (PhC)/ Clinical Pharmacy Specialist

## 2012-10-27 DIAGNOSIS — E119 Type 2 diabetes mellitus without complications: Secondary | ICD-10-CM | POA: Diagnosis not present

## 2012-10-27 NOTE — Progress Notes (Signed)
  Subjective:    [name], male    DOB: [birth year], 72 y.o.   MRN: [id number]  HPI New Onset Type 2 diabetes mellitus follow up visit after starting metformin 500mg  bid and dietary counseling.     Review of Systems  Constitutional: Negative for appetite change.  Cardiovascular: Positive for chest pain, palpitations and leg swelling.  Musculoskeletal: Negative.   Psychiatric/Behavioral: Negative.        Objective:   Physical Exam  Constitutional: He appears well-developed and well-nourished.  Musculoskeletal: He exhibits no edema and no tenderness.  Skin: Skin is warm and dry.  Psychiatric: He has a normal mood and affect. His behavior is normal. Judgment and thought content normal.          Assessment & Plan:   Diabetes Follow-Up Visit Chief Complaint:  No chief complaint on file.    Exam Regularity:  RRR Edema:  neg  Polyuria:  neg  Polydipsia:  neg Polyphagia:  neg  BMI:  There is no weight on file to calculate BMI.   Weight changes:  Weight loss General Appearance:  alert, oriented, no acute distress Mood/Affect:  normal  HPI:  New onset type 2 DM  Low fat/carbohydrate diet?  Yes Nicotine Abuse?  No Medication Compliance?  Yes Exercise?  Yes Alcohol Abuse?  No  Home BG Monitoring:  Checking 5 times a day. Average:  110  High: 267  Low:  101   No results found for this basename: HGBA1C    No results found for this basename: MICROALBUR, MALB24HUR    No results found for this basename: CHOL, HDL, LDLCALC, LDLDIRECT, TRIG, CHOLHDL      Assessment: 1.  Diabetes.  Excellent control of type 2 diabetes 2.  Blood Pressure.  At goal on ARB 3.  Lipids.  Changed to simvastatin 40mg  with lipids to be re-checked in July 4.  Foot Care.  Reviewed with patient 5.  Dental Care.  biannual 6.  Eye Care/Exam.  annaul  Recommendations: 1.  Patient is counseled on appropriate foot care. 2.  BP goal < 130/80. 3.  LDL goal of < 100, HDL > 40 and TG <  150. 4.  Eye Exam yearly and Dental Exam every 6 months. 5.  Dietary recommendations:  1800 cal ADA diet 6.  Physical Activity recommendations:  30 minutes a day 7.  Medication recommendations at this time are as follows:  Continue metformin, stop evening dose if BG <80. 8.  Return to clinic in 4-6 wks   Time spent counseling patient:  37  Physician time spent with patient:  0 Referring provider:  Christell Constant   PharmD:  Chari Manning, [facility]

## 2012-12-24 ENCOUNTER — Other Ambulatory Visit: Payer: BC Managed Care – PPO

## 2012-12-24 ENCOUNTER — Other Ambulatory Visit (INDEPENDENT_AMBULATORY_CARE_PROVIDER_SITE_OTHER): Payer: Medicare Other

## 2012-12-24 DIAGNOSIS — E559 Vitamin D deficiency, unspecified: Secondary | ICD-10-CM | POA: Diagnosis not present

## 2012-12-24 DIAGNOSIS — I10 Essential (primary) hypertension: Secondary | ICD-10-CM | POA: Diagnosis not present

## 2012-12-24 DIAGNOSIS — E785 Hyperlipidemia, unspecified: Secondary | ICD-10-CM | POA: Diagnosis not present

## 2012-12-24 LAB — HEPATIC FUNCTION PANEL
AST: 19 U/L (ref 0–37)
Alkaline Phosphatase: 48 U/L (ref 39–117)
Bilirubin, Direct: 0.2 mg/dL (ref 0.0–0.3)
Indirect Bilirubin: 0.4 mg/dL (ref 0.0–0.9)
Total Bilirubin: 0.6 mg/dL (ref 0.3–1.2)
Total Protein: 6.2 g/dL (ref 6.0–8.3)

## 2012-12-24 LAB — POCT CBC
MCV: 94.2 fL (ref 80–97)
POC LYMPH PERCENT: 16.6 %L (ref 10–50)
WBC: 5.4 10*3/uL (ref 4.6–10.2)

## 2012-12-24 LAB — BASIC METABOLIC PANEL WITH GFR
BUN: 20 mg/dL (ref 6–23)
Chloride: 103 mEq/L (ref 96–112)
Creat: 1.17 mg/dL (ref 0.50–1.35)
GFR, Est African American: 72 mL/min
GFR, Est Non African American: 62 mL/min
Glucose, Bld: 106 mg/dL — ABNORMAL HIGH (ref 70–99)

## 2012-12-24 NOTE — Progress Notes (Signed)
PT CAME IN FOR LABS ONLY 

## 2012-12-25 LAB — NMR LIPOPROFILE WITH LIPIDS
HDL Size: 9.2 nm (ref >=9.2)
LDL (calc): 52 mg/dL (ref <100)
LDL Particle Number: 592 nmol/L (ref <1000)
LDL Size: 21.1 nm (ref >20.5)
LP-IR Score: 26 (ref <=45)
Triglycerides: 46 mg/dL (ref <150)

## 2012-12-25 LAB — VITAMIN D 25 HYDROXY (VIT D DEFICIENCY, FRACTURES): Vit D, 25-Hydroxy: 69 ng/mL (ref 30–89)

## 2012-12-30 ENCOUNTER — Encounter: Payer: Self-pay | Admitting: Family Medicine

## 2012-12-30 ENCOUNTER — Ambulatory Visit (INDEPENDENT_AMBULATORY_CARE_PROVIDER_SITE_OTHER): Payer: Medicare Other | Admitting: Family Medicine

## 2012-12-30 VITALS — BP 133/65 | HR 45 | Temp 97.0°F | Ht 68.5 in | Wt 179.0 lb

## 2012-12-30 DIAGNOSIS — I10 Essential (primary) hypertension: Secondary | ICD-10-CM

## 2012-12-30 DIAGNOSIS — E785 Hyperlipidemia, unspecified: Secondary | ICD-10-CM | POA: Diagnosis not present

## 2012-12-30 DIAGNOSIS — E1169 Type 2 diabetes mellitus with other specified complication: Secondary | ICD-10-CM | POA: Insufficient documentation

## 2012-12-30 DIAGNOSIS — R7303 Prediabetes: Secondary | ICD-10-CM | POA: Insufficient documentation

## 2012-12-30 DIAGNOSIS — E1159 Type 2 diabetes mellitus with other circulatory complications: Secondary | ICD-10-CM | POA: Insufficient documentation

## 2012-12-30 DIAGNOSIS — N400 Enlarged prostate without lower urinary tract symptoms: Secondary | ICD-10-CM | POA: Insufficient documentation

## 2012-12-30 DIAGNOSIS — R7309 Other abnormal glucose: Secondary | ICD-10-CM

## 2012-12-30 NOTE — Progress Notes (Signed)
  Subjective:    [name], male    DOB: 08/07/1940, 72 y.o.   MRN: [id number]  HPI  patient returns to clinic today with his wife for followup and management of chronic medical problems. These include hypertension, hyperlipidemia, diabetes mellitus type 2 controlled, hiatal hernia with GERD, and BPH. Recent labs were reviewed with the patient at the time of his visit. See home blood sugar readings, these were reviewed with patient.   Review of Systems  HENT: Negative.  Negative for ear pain, congestion, sore throat, postnasal drip and sinus pressure.   Eyes: Negative.  Negative for pain, discharge, redness, itching and visual disturbance.  Respiratory: Negative.  Negative for cough, choking and chest tightness.   Cardiovascular: Negative.  Negative for chest pain, palpitations and leg swelling.  Gastrointestinal: Negative for nausea, vomiting, abdominal pain, diarrhea, constipation, blood in stool and rectal pain.  Genitourinary: Negative for dysuria, frequency, hematuria and testicular pain.  Musculoskeletal: Positive for arthralgias.  Neurological: Negative.  Negative for dizziness, tremors, weakness, light-headedness and headaches.  Hematological: Negative.   Psychiatric/Behavioral: The patient is nervous/anxious (some depression).        Objective:   Physical Exam BP 133/65  Pulse 45  Temp(Src) 97 F (36.1 C) (Oral)  Ht 5' 8.5" (1.74 m)  Wt 179 lb (81.194 kg)  BMI 26.82 kg/m2  The patient appeared well nourished and normally developed, alert and oriented to time and place. Speech, behavior and judgement appear normal. Vital signs as documented.  Head exam is unremarkable. No scleral icterus or pallor noted. Ears nose and throat were within normal limits.  Neck is without jugular venous distension, thyromegally, or carotid bruits. Carotid upstrokes are brisk bilaterally. No cervical adenopathy. Lungs are clear anteriorly and posteriorly to auscultation. Normal  respiratory effort. Cardiac exam reveals regular rate and rhythm at 52-60 per minute. First and second heart sounds normal.  No murmurs, rubs or gallops.  Abdominal exam reveals normal bowl sounds, no masses, no organomegaly and no aortic enlargement. No inguinal adenopathy. There was some pretty significant epigastric tenderness. Extremities are nonedematous and both femoral and pedal pulses are normal. Skin without pallor or jaundice.  Warm and very dry, without rash. Neurologic exam reveals normal deep tendon reflexes and normal sensation. Diabetic foot exam was done          Assessment & Plan:  1. Hyperlipemia  2. Diabetes mellitus type II control  3. Hypertension -Good control  4. BPH (benign prostatic hypertrophy) -Will need PSA and rectal exam next visit  Patient Instructions  Fall precautions discussed Continue current meds and therapeutic lifestyle changes Bring back FOBT Repeat CBC in 4 week Take Nexium in the morning one half hour before eating, take Prilosec or Zantac one half hour before dinner If epigastric pain continues may need endoscopy   Nyra Capes MD

## 2012-12-30 NOTE — Patient Instructions (Addendum)
Fall precautions discussed Continue current meds and therapeutic lifestyle changes Bring back FOBT Repeat CBC in 4 week Take Nexium in the morning one half hour before eating, take Prilosec or Zantac one half hour before dinner If epigastric pain continues may need endoscopy

## 2012-12-31 ENCOUNTER — Other Ambulatory Visit: Payer: Medicare Other

## 2012-12-31 DIAGNOSIS — Z1212 Encounter for screening for malignant neoplasm of rectum: Secondary | ICD-10-CM | POA: Diagnosis not present

## 2013-01-01 LAB — FECAL OCCULT BLOOD, IMMUNOCHEMICAL: Fecal Occult Bld: NEGATIVE

## 2013-03-23 ENCOUNTER — Other Ambulatory Visit: Payer: Self-pay | Admitting: *Deleted

## 2013-03-23 ENCOUNTER — Ambulatory Visit (INDEPENDENT_AMBULATORY_CARE_PROVIDER_SITE_OTHER): Payer: Medicare Other | Admitting: *Deleted

## 2013-03-23 DIAGNOSIS — Z23 Encounter for immunization: Secondary | ICD-10-CM | POA: Diagnosis not present

## 2013-03-23 DIAGNOSIS — E119 Type 2 diabetes mellitus without complications: Secondary | ICD-10-CM

## 2013-03-23 MED ORDER — GLUCOSE BLOOD VI STRP: ORAL_STRIP | Status: DC

## 2013-04-01 ENCOUNTER — Telehealth: Payer: Self-pay | Admitting: Family Medicine

## 2013-04-01 ENCOUNTER — Ambulatory Visit (INDEPENDENT_AMBULATORY_CARE_PROVIDER_SITE_OTHER): Payer: Medicare Other | Admitting: Nurse Practitioner

## 2013-04-01 ENCOUNTER — Encounter: Payer: Self-pay | Admitting: Nurse Practitioner

## 2013-04-01 VITALS — BP 123/68 | HR 98 | Temp 97.9°F | Ht 68.5 in | Wt 181.0 lb

## 2013-04-01 DIAGNOSIS — M545 Low back pain: Secondary | ICD-10-CM

## 2013-04-01 DIAGNOSIS — IMO0002 Reserved for concepts with insufficient information to code with codable children: Secondary | ICD-10-CM

## 2013-04-01 DIAGNOSIS — S90454A Superficial foreign body, right lesser toe(s), initial encounter: Secondary | ICD-10-CM

## 2013-04-01 LAB — POCT URINALYSIS DIPSTICK
Blood, UA: NEGATIVE
Leukocytes, UA: NEGATIVE
Nitrite, UA: NEGATIVE
Spec Grav, UA: 1.025
Urobilinogen, UA: NEGATIVE

## 2013-04-01 LAB — POCT UA - MICROSCOPIC ONLY
Casts, Ur, LPF, POC: NEGATIVE
Crystals, Ur, HPF, POC: NEGATIVE
Yeast, UA: NEGATIVE

## 2013-04-01 MED ORDER — CEPHALEXIN 500 MG PO CAPS: ORAL_CAPSULE | ORAL | Status: DC

## 2013-04-01 NOTE — Progress Notes (Signed)
  Subjective:    [name], male    DOB: Mar 28, 1941, 71 y.o.   MRN: [id number]  Urinary Tract Infection  This is a new problem. The current episode started 1 to 4 weeks ago. The problem occurs every urination. The problem has been unchanged. The patient is experiencing no pain. There has been no fever. There is no history of pyelonephritis. Associated symptoms include urgency. Pertinent negatives include no flank pain or hematuria. He has tried nothing for the symptoms.   Pt here today with right great toe pain. It started two weeks ago and pt noticed a small black spot on bottom of toe. Pt can not remember stepping on anything, hitting it on anything, or any trauma. Pt's pain is a 4 out 10 when he applies pressure to area. Walking makes it worse. Pt is a diabetic, but denies any numbness or tingling.    Review of Systems  Genitourinary: Positive for urgency. Negative for hematuria and flank pain.  All other systems reviewed and are negative.       Objective:   Physical Exam  Vitals reviewed. Constitutional: He is oriented to person, place, and time. He appears well-developed and well-nourished.  Cardiovascular: Normal rate, regular rhythm, normal heart sounds and intact distal pulses.   Pulmonary/Chest: Effort normal and breath sounds normal.  Neurological: He is alert and oriented to person, place, and time.  Skin: Skin is warm and dry.  Small raised black knot on bottom of right great toe.   Psychiatric: He has a normal mood and affect. His behavior is normal. Judgment and thought content normal.   BP 123/68  Pulse 98  Temp(Src) 97.9 F (36.6 C) (Oral)  Ht 5' 8.5" (1.74 m)  Wt 181 lb (82.101 kg)  BMI 27.12 kg/m2 Results for orders placed in visit on 04/01/13  POCT URINALYSIS DIPSTICK      Result Value Range   Color, UA gold     Clarity, UA clear     Glucose, UA neg     Bilirubin, UA neg     Ketones, UA neg     Spec Grav, UA 1.025     Blood, UA neg     pH, UA 6.0     Protein, UA neg     Urobilinogen, UA negative     Nitrite, UA neg     Leukocytes, UA Negative           Assessment & Plan:   1. Low back pain   2. Foreign body of toe of right foot    Orders Placed This Encounter  Procedures  . POCT UA - Microscopic Only  . POCT urinalysis dipstick   Meds ordered this encounter  Medications  . cephALEXin (KEFLEX) 500 MG capsule    Sig: 3 PO TID for 10 days    Dispense:  30 capsule    Refill:  0    Order Specific Question:  Supervising Provider    Answer:  Deborra Medina    Look for s/s of infection Soak in epsom salt Follow up in prn  Mary-Margaret Daphine Deutscher, FNP

## 2013-04-01 NOTE — Telephone Encounter (Signed)
Appt given for today 

## 2013-04-01 NOTE — Patient Instructions (Signed)
Wound Care Wound care helps prevent pain and infection.  You may need a tetanus shot if:  You cannot remember when you had your last tetanus shot.  You have never had a tetanus shot.  The injury broke your skin. If you need a tetanus shot and you choose not to have one, you may get tetanus. Sickness from tetanus can be serious. HOME CARE   Only take medicine as told by your doctor.  Clean the wound daily with mild soap and water.  Change any bandages (dressings) as told by your doctor.  Put medicated cream and a bandage on the wound as told by your doctor.  Change the bandage if it gets wet, dirty, or starts to smell.  Take showers. Do not take baths, swim, or do anything that puts your wound under water.  Rest and raise (elevate) the wound until the pain and puffiness (swelling) are better.  Keep all doctor visits as told. GET HELP RIGHT AWAY IF:   Yellowish-white fluid (pus) comes from the wound.  Medicine does not lessen your pain.  There is a red streak going away from the wound.  You have a fever. MAKE SURE YOU:   Understand these instructions.  Will watch your condition.  Will get help right away if you are not doing well or get worse. Document Released: 02/27/2008 Document Revised: 08/12/2011 Document Reviewed: 09/23/2010 [employer] Patient Information 2014 [employer].  

## 2013-04-06 ENCOUNTER — Other Ambulatory Visit: Payer: Self-pay | Admitting: Family Medicine

## 2013-04-14 ENCOUNTER — Other Ambulatory Visit: Payer: Self-pay | Admitting: Family Medicine

## 2013-04-28 ENCOUNTER — Encounter: Payer: Self-pay | Admitting: *Deleted

## 2013-05-25 ENCOUNTER — Telehealth: Payer: Self-pay | Admitting: Family Medicine

## 2013-05-25 ENCOUNTER — Ambulatory Visit (INDEPENDENT_AMBULATORY_CARE_PROVIDER_SITE_OTHER): Payer: Medicare Other | Admitting: Family Medicine

## 2013-05-25 ENCOUNTER — Encounter: Payer: Self-pay | Admitting: Family Medicine

## 2013-05-25 VITALS — BP 128/60 | HR 53 | Temp 98.4°F | Ht 68.5 in | Wt 184.0 lb

## 2013-05-25 DIAGNOSIS — R6889 Other general symptoms and signs: Secondary | ICD-10-CM

## 2013-05-25 DIAGNOSIS — R509 Fever, unspecified: Secondary | ICD-10-CM

## 2013-05-25 DIAGNOSIS — J111 Influenza due to unidentified influenza virus with other respiratory manifestations: Secondary | ICD-10-CM

## 2013-05-25 DIAGNOSIS — J029 Acute pharyngitis, unspecified: Secondary | ICD-10-CM | POA: Diagnosis not present

## 2013-05-25 LAB — POCT INFLUENZA A/B
Influenza A, POC: POSITIVE
Influenza B, POC: NEGATIVE

## 2013-05-25 LAB — POCT RAPID STREP A (OFFICE): Rapid Strep A Screen: NEGATIVE

## 2013-05-25 MED ORDER — OSELTAMIVIR PHOSPHATE 75 MG PO CAPS: 75.0000 mg | ORAL_CAPSULE | Freq: Two times a day (BID) | ORAL | Status: DC

## 2013-05-25 NOTE — Patient Instructions (Signed)
Take Tylenol or Advil for aches pains and fever Drink plenty of fluids Avoid caffeine milk cheese ice cream and dairy products Take prescribed medication as directed

## 2013-05-25 NOTE — Telephone Encounter (Signed)
appt scheduled

## 2013-05-25 NOTE — Progress Notes (Signed)
Subjective:    [name], male    DOB: 06/05/1940, 72 y.o.   MRN: [id number]  HPI Patient here today for flu-like symptoms. Patient comes in today with aches pains and fever over the past 24 hours. He did take a flu shot. He has had a slight cough. Minimal abdominal discomfort. No diarrhea nausea or vomiting     Patient Active Problem List   Diagnosis Date Noted  . Hyperlipemia 12/30/2012  . Hypertension 12/30/2012  . BPH (benign prostatic hypertrophy) 12/30/2012  . Diabetes type 2, controlled 09/28/2012  . RBBB 04/11/2010  . CARDIOVASCULAR FUNCTION STUDY, ABNORMAL 04/11/2010  . TUBULOVILLOUS ADENOMA, COLON 09/14/2007  . HIATAL HERNIA, with GERD 09/14/2007  . NEPHROLITHIASIS 09/14/2007   Outpatient Encounter Prescriptions as of 05/25/2013  Medication Sig  . aspirin 81 MG tablet Take 81 mg by mouth daily.    . cholecalciferol (VITAMIN D) 1000 UNITS tablet Take 2,000 Units by mouth daily.   . Cinnamon 500 MG TABS Take 1,000 tablets by mouth 2 (two) times daily.  Marland Kitchen glucose blood (ONE TOUCH ULTRA TEST) test strip Check blood sugar qid and prn  . Melatonin 5 MG TABS Take 1 tablet by mouth daily.    . metFORMIN (GLUCOPHAGE) 500 MG tablet TAKE ONE TABLET BY MOUTH TWICE DAILY AFTER MEALS  . NEXIUM 40 MG capsule TAKE ONE CAPSULE BY MOUTH ONE TIME DAILY  . ONETOUCH DELICA LANCETS 33G MISC   . simvastatin (ZOCOR) 40 MG tablet Take 40 mg by mouth every evening.  . valsartan-hydrochlorothiazide (DIOVAN-HCT) 320-25 MG per tablet Take 0.25 tablets by mouth daily.   . [DISCONTINUED] cephALEXin (KEFLEX) 500 MG capsule 3 PO TID for 10 days    Review of Systems  Constitutional: Positive for fever.  HENT: Positive for congestion, postnasal drip, sinus pressure, sneezing and sore throat.   Eyes: Negative.   Respiratory: Positive for cough.   Cardiovascular: Negative.   Gastrointestinal: Negative.   Endocrine: Negative.   Genitourinary: Negative.   Musculoskeletal: Positive  for myalgias.  Skin: Negative.   Allergic/Immunologic: Negative.   Neurological: Positive for headaches.  Hematological: Negative.   Psychiatric/Behavioral: Negative.        Objective:   Physical Exam  Constitutional: He is oriented to person, place, and time. He appears well-developed and well-nourished. No distress.  HENT:  Head: Normocephalic and atraumatic.  Right Ear: External ear normal.  Left Ear: External ear normal.  Nose: Nose normal.  Mouth/Throat: No oropharyngeal exudate.  Throat is red posteriorly  Eyes: Conjunctivae and EOM are normal. Pupils are equal, round, and reactive to light. Right eye exhibits no discharge. Left eye exhibits no discharge. No scleral icterus.  Neck: Normal range of motion. Neck supple. No thyromegaly present.  Cardiovascular: Normal rate, regular rhythm and normal heart sounds.   No murmur heard. Pulmonary/Chest: Effort normal and breath sounds normal. No respiratory distress. He has no wheezes. He has no rales.  Abdominal: Soft. Bowel sounds are normal. He exhibits no mass. There is no tenderness. There is no rebound.  Musculoskeletal: Normal range of motion.  Lymphadenopathy:    He has no cervical adenopathy.  Neurological: He is alert and oriented to person, place, and time.  Skin: Skin is warm and dry. No rash noted. He is not diaphoretic.  Psychiatric: He has a normal mood and affect. His behavior is normal. Judgment and thought content normal.   BP 128/60  Pulse 53  Temp(Src) 98.4 F (36.9 C) (Oral)  Ht 5'  8.5" (1.74 m)  Wt 184 lb (83.462 kg)  BMI 27.57 kg/m2  Results for orders placed in visit on 05/25/13  POCT INFLUENZA A/B      Result Value Range   Influenza A, POC Positive     Influenza B, POC Negative    POCT RAPID STREP A (OFFICE)      Result Value Range   Rapid Strep A Screen Negative  Negative         Assessment & Plan:  1. Sore throat - POCT rapid strep A - Strep A culture, throat  2. Fever, unspecified -  POCT Influenza A/B - POCT rapid strep A - Strep A culture, throat  3. Flu-like symptoms - POCT Influenza A/B - oseltamivir (TAMIFLU) 75 MG capsule; Take 1 capsule (75 mg total) by mouth 2 (two) times daily.  Dispense: 10 capsule; Refill: 0  4. Flu - oseltamivir (TAMIFLU) 75 MG capsule; Take 1 capsule (75 mg total) by mouth 2 (two) times daily.  Dispense: 10 capsule; Refill: 0  Patient Instructions  Take Tylenol or Advil for aches pains and fever Drink plenty of fluids Avoid caffeine milk cheese ice cream and dairy products Take prescribed medication as directed   Nyra Capes MD

## 2013-05-26 NOTE — Telephone Encounter (Signed)
Pt's wife notified ok to treat the sxs with tylenol cold ans sinus

## 2013-05-27 LAB — STREP A CULTURE, THROAT: Strep A Culture: NEGATIVE

## 2013-06-01 ENCOUNTER — Ambulatory Visit: Payer: Medicare Other | Admitting: Family Medicine

## 2013-06-02 ENCOUNTER — Ambulatory Visit: Payer: Medicare Other | Admitting: Family Medicine

## 2013-06-08 ENCOUNTER — Ambulatory Visit (INDEPENDENT_AMBULATORY_CARE_PROVIDER_SITE_OTHER): Payer: Medicare Other | Admitting: Family Medicine

## 2013-06-08 ENCOUNTER — Encounter: Payer: Self-pay | Admitting: Family Medicine

## 2013-06-08 VITALS — BP 132/57 | HR 58 | Temp 96.5°F | Ht 68.5 in | Wt 184.5 lb

## 2013-06-08 DIAGNOSIS — N400 Enlarged prostate without lower urinary tract symptoms: Secondary | ICD-10-CM

## 2013-06-08 DIAGNOSIS — I10 Essential (primary) hypertension: Secondary | ICD-10-CM

## 2013-06-08 DIAGNOSIS — Z23 Encounter for immunization: Secondary | ICD-10-CM | POA: Diagnosis not present

## 2013-06-08 DIAGNOSIS — Z0000 Encounter for general adult medical examination without abnormal findings: Secondary | ICD-10-CM

## 2013-06-08 DIAGNOSIS — E119 Type 2 diabetes mellitus without complications: Secondary | ICD-10-CM

## 2013-06-08 DIAGNOSIS — E785 Hyperlipidemia, unspecified: Secondary | ICD-10-CM

## 2013-06-08 DIAGNOSIS — E559 Vitamin D deficiency, unspecified: Secondary | ICD-10-CM

## 2013-06-08 NOTE — Addendum Note (Signed)
Addended by: Zannie Cove on: 06/08/2013 04:28 PM   Modules accepted: Orders

## 2013-06-08 NOTE — Progress Notes (Signed)
Subjective:    [name], male    DOB: [birth year], 73 y.o.   MRN: [id number]  HPI Pt here for follow up and management of chronic medical problems. Patient comes in today with no particular complaints. Patient is also here today to get his annual exam. He comes to visit with his wife.       Patient Active Problem List   Diagnosis Date Noted  . Hyperlipemia 12/30/2012  . Hypertension 12/30/2012  . BPH (benign prostatic hypertrophy) 12/30/2012  . Diabetes type 2, controlled 09/28/2012  . RBBB 04/11/2010  . CARDIOVASCULAR FUNCTION STUDY, ABNORMAL 04/11/2010  . TUBULOVILLOUS ADENOMA, COLON 09/14/2007  . HIATAL HERNIA, with GERD 09/14/2007  . NEPHROLITHIASIS 09/14/2007   Outpatient Encounter Prescriptions as of 06/08/2013  Medication Sig  . aspirin 81 MG tablet Take 81 mg by mouth daily.    . cholecalciferol (VITAMIN D) 1000 UNITS tablet Take 2,000 Units by mouth daily.   . Cinnamon 500 MG TABS Take 1,000 tablets by mouth 2 (two) times daily.  Marland Kitchen glucose blood (ONE TOUCH ULTRA TEST) test strip Check blood sugar qid and prn  . Melatonin 5 MG TABS Take 1 tablet by mouth daily.    . metFORMIN (GLUCOPHAGE) 500 MG tablet TAKE ONE TABLET BY MOUTH TWICE DAILY AFTER MEALS  . NEXIUM 40 MG capsule TAKE ONE CAPSULE BY MOUTH ONE TIME DAILY  . ONETOUCH DELICA LANCETS 94W MISC   . simvastatin (ZOCOR) 40 MG tablet Take 40 mg by mouth every evening.  . valsartan-hydrochlorothiazide (DIOVAN-HCT) 320-25 MG per tablet Take 0.25 tablets by mouth daily.   . [DISCONTINUED] oseltamivir (TAMIFLU) 75 MG capsule Take 1 capsule (75 mg total) by mouth 2 (two) times daily.    Review of Systems  Constitutional: Negative.   HENT: Negative.   Eyes: Negative.   Respiratory: Negative.   Cardiovascular: Negative.   Gastrointestinal: Negative.   Endocrine: Negative.   Genitourinary: Negative.   Musculoskeletal: Negative.   Skin: Negative.   Allergic/Immunologic: Negative.   Neurological:  Negative.   Hematological: Negative.   Psychiatric/Behavioral: Negative.        Objective:   Physical Exam  Nursing note and vitals reviewed. Constitutional: He is oriented to person, place, and time. He appears well-developed and well-nourished. No distress.  HENT:  Head: Normocephalic and atraumatic.  Right Ear: External ear normal.  Left Ear: External ear normal.  Nose: Nose normal.  Mouth/Throat: Oropharynx is clear and moist. No oropharyngeal exudate.  Eyes: Conjunctivae and EOM are normal. Pupils are equal, round, and reactive to light. Right eye exhibits no discharge. Left eye exhibits no discharge. No scleral icterus.  Neck: Normal range of motion. Neck supple. No tracheal deviation present. No thyromegaly present.  No carotid bruits  Cardiovascular: Normal rate, regular rhythm, normal heart sounds and intact distal pulses.  Exam reveals no gallop and no friction rub.   No murmur heard. At 60 per minute  Pulmonary/Chest: Effort normal and breath sounds normal. No respiratory distress. He has no wheezes. He has no rales. He exhibits no tenderness.  No axillary nodes  Abdominal: Soft. Bowel sounds are normal. He exhibits no distension and no mass. There is tenderness. There is no rebound and no guarding.  Slight epigastric and suprapubic tenderness  Genitourinary: Rectum normal and penis normal.  Prostate was smooth but enlarged bilaterally without any lumps or masses. There were no rectal masses. The external genitalia were normal. There was no hernia palpated.  Musculoskeletal: Normal range of  motion. He exhibits no edema and no tenderness.  Lymphadenopathy:    He has no cervical adenopathy.  Neurological: He is alert and oriented to person, place, and time. He has normal reflexes. No cranial nerve deficit.  Skin: Skin is warm and dry. No rash noted. No erythema. No pallor.  Psychiatric: He has a normal mood and affect. His behavior is normal. Judgment and thought content  normal.   BP 132/57  Pulse 58  Temp(Src) 96.5 F (35.8 C) (Oral)  Ht 5' 8.5" (1.74 m)  Wt 184 lb 8 oz (83.689 kg)  BMI 27.64 kg/m2        Assessment & Plan:  1. Hyperlipemia- POCT CBC; Future - NMR, lipoprofile; Future  2. Hypertension - POCT CBC; Future - POCT UA - Microalbumin; Future - BMP8+EGFR; Future - Hepatic function panel; Future  3. Diabetes type 2, controlled - POCT CBC; Future - POCT glycosylated hemoglobin (Hb A1C); Future - POCT UA - Microalbumin; Future  4. BPH (benign prostatic hypertrophy) - POCT CBC; Future - PSA, total and free; Future  5. Vitamin D deficiency - Vit D  25 hydroxy (rtn osteoporosis monitoring); Future  6. Annual physical exam  Patient Instructions  Continue current medications. Continue good therapeutic lifestyle changes which include good diet and exercise. Fall precautions discussed with patient. Schedule your flu vaccine if you haven't had it yet If you are over 69 years old - you may need Prevnar 70 or the adult Pneumonia vaccine. Try to get back in your retained watching your diet and exercising more regularly Do not forget to get your eye exam in March which will be a one year recheck   Arrie Senate MD

## 2013-06-08 NOTE — Patient Instructions (Addendum)
Continue current medications. Continue good therapeutic lifestyle changes which include good diet and exercise. Fall precautions discussed with patient. Schedule your flu vaccine if you haven't had it yet If you are over 73 years old - you may need Prevnar 60 or the adult Pneumonia vaccine. Try to get back in your retained watching your diet and exercising more regularly Do not forget to get your eye exam in March which will be a one year recheck

## 2013-06-10 ENCOUNTER — Other Ambulatory Visit: Payer: Self-pay | Admitting: Family Medicine

## 2013-06-11 ENCOUNTER — Other Ambulatory Visit (INDEPENDENT_AMBULATORY_CARE_PROVIDER_SITE_OTHER): Payer: Medicare Other

## 2013-06-11 DIAGNOSIS — I10 Essential (primary) hypertension: Secondary | ICD-10-CM

## 2013-06-11 DIAGNOSIS — E119 Type 2 diabetes mellitus without complications: Secondary | ICD-10-CM

## 2013-06-11 DIAGNOSIS — N400 Enlarged prostate without lower urinary tract symptoms: Secondary | ICD-10-CM

## 2013-06-11 DIAGNOSIS — E785 Hyperlipidemia, unspecified: Secondary | ICD-10-CM | POA: Diagnosis not present

## 2013-06-11 DIAGNOSIS — E559 Vitamin D deficiency, unspecified: Secondary | ICD-10-CM | POA: Diagnosis not present

## 2013-06-11 LAB — POCT CBC
Granulocyte percent: 80.9 %G — AB (ref 37–80)
HEMATOCRIT: 38.7 % — AB (ref 43.5–53.7)
HEMOGLOBIN: 12.4 g/dL — AB (ref 14.1–18.1)
Lymph, poc: 1.1 (ref 0.6–3.4)
MCH, POC: 30.7 pg (ref 27–31.2)
MCHC: 32.1 g/dL (ref 31.8–35.4)
MCV: 95.4 fL (ref 80–97)
MPV: 7.8 fL (ref 0–99.8)
POC GRANULOCYTE: 4.9 (ref 2–6.9)
POC LYMPH PERCENT: 17.4 %L (ref 10–50)
Platelet Count, POC: 147 10*3/uL (ref 142–424)
RBC: 4.1 M/uL — AB (ref 4.69–6.13)
RDW, POC: 12.8 %
WBC: 6.1 10*3/uL (ref 4.6–10.2)

## 2013-06-11 LAB — POCT GLYCOSYLATED HEMOGLOBIN (HGB A1C): HEMOGLOBIN A1C: 5.5

## 2013-06-11 LAB — POCT UA - MICROALBUMIN: MICROALBUMIN (UR) POC: NEGATIVE mg/L

## 2013-06-11 NOTE — Progress Notes (Signed)
Patient came in for labs only.

## 2013-06-12 LAB — BMP8+EGFR
BUN/Creatinine Ratio: 14 (ref 10–22)
BUN: 16 mg/dL (ref 8–27)
CO2: 25 mmol/L (ref 18–29)
Calcium: 9.2 mg/dL (ref 8.6–10.2)
Chloride: 100 mmol/L (ref 97–108)
Creatinine, Ser: 1.12 mg/dL (ref 0.76–1.27)
GFR calc Af Amer: 75 mL/min/{1.73_m2} (ref >59)
GFR calc non Af Amer: 65 mL/min/{1.73_m2} (ref >59)
Glucose: 117 mg/dL — ABNORMAL HIGH (ref 65–99)
Potassium: 4.8 mmol/L (ref 3.5–5.2)
SODIUM: 139 mmol/L (ref 134–144)

## 2013-06-12 LAB — NMR, LIPOPROFILE
CHOLESTEROL: 118 mg/dL (ref <200)
HDL CHOLESTEROL BY NMR: 51 mg/dL (ref >=40)
HDL Particle Number: 34.7 umol/L (ref >=30.5)
LDL PARTICLE NUMBER: 498 nmol/L (ref <1000)
LDL SIZE: 20.9 nm (ref >20.5)
LDLC SERPL CALC-MCNC: 52 mg/dL (ref <100)
LP-IR Score: 42 (ref <=45)
Small LDL Particle Number: 206 nmol/L (ref <=527)
Triglycerides by NMR: 75 mg/dL (ref <150)

## 2013-06-12 LAB — HEPATIC FUNCTION PANEL
ALT: 16 IU/L (ref 0–44)
AST: 17 IU/L (ref 0–40)
Albumin: 4.2 g/dL (ref 3.5–4.8)
Alkaline Phosphatase: 53 IU/L (ref 39–117)
BILIRUBIN TOTAL: 0.6 mg/dL (ref 0.0–1.2)
Bilirubin, Direct: 0.2 mg/dL (ref 0.00–0.40)
TOTAL PROTEIN: 6.1 g/dL (ref 6.0–8.5)

## 2013-06-12 LAB — PSA, TOTAL AND FREE
PSA FREE PCT: 30 %
PSA FREE: 0.78 ng/mL
PSA: 2.6 ng/mL (ref 0.0–4.0)

## 2013-06-12 LAB — VITAMIN D 25 HYDROXY (VIT D DEFICIENCY, FRACTURES): Vit D, 25-Hydroxy: 54.9 ng/mL (ref 30.0–100.0)

## 2013-08-04 ENCOUNTER — Encounter: Payer: Self-pay | Admitting: Internal Medicine

## 2013-08-06 DIAGNOSIS — E119 Type 2 diabetes mellitus without complications: Secondary | ICD-10-CM | POA: Diagnosis not present

## 2013-08-24 ENCOUNTER — Encounter: Payer: Self-pay | Admitting: *Deleted

## 2013-08-29 ENCOUNTER — Other Ambulatory Visit: Payer: Self-pay | Admitting: Family Medicine

## 2013-09-10 ENCOUNTER — Telehealth: Payer: Self-pay | Admitting: Family Medicine

## 2013-09-10 NOTE — Telephone Encounter (Signed)
There are no available appointments on Monday or Tuesday. Patient notified. He isn't sure if he can make it Wednesday but will know something by Monday. I asked him to call back Monday and speak with Roselyn Reef if he is unable to come on Wednesday.  I explained that I didn't see anyway that she could work him in sooner either but that he can talk with her if he wants. Patient agreed to call back if he can't come Wednesday.

## 2013-09-11 ENCOUNTER — Other Ambulatory Visit: Payer: Self-pay | Admitting: Family Medicine

## 2013-09-15 ENCOUNTER — Ambulatory Visit: Payer: Medicare Other | Admitting: Family Medicine

## 2013-09-16 ENCOUNTER — Ambulatory Visit (INDEPENDENT_AMBULATORY_CARE_PROVIDER_SITE_OTHER): Payer: Medicare Other | Admitting: Family Medicine

## 2013-09-16 ENCOUNTER — Encounter: Payer: Self-pay | Admitting: Family Medicine

## 2013-09-16 ENCOUNTER — Ambulatory Visit (INDEPENDENT_AMBULATORY_CARE_PROVIDER_SITE_OTHER): Payer: Medicare Other

## 2013-09-16 VITALS — BP 157/75 | HR 49 | Temp 97.5°F | Ht 68.5 in | Wt 190.0 lb

## 2013-09-16 DIAGNOSIS — M25519 Pain in unspecified shoulder: Secondary | ICD-10-CM | POA: Diagnosis not present

## 2013-09-16 DIAGNOSIS — M25511 Pain in right shoulder: Secondary | ICD-10-CM

## 2013-09-16 MED ORDER — MELOXICAM 7.5 MG PO TABS: 7.5000 mg | ORAL_TABLET | Freq: Every day | ORAL | Status: DC

## 2013-09-16 NOTE — Progress Notes (Signed)
Subjective:    [name], male    DOB: December 20, 1940, 73 y.o.   MRN: [id number]  HPI Patient here today for right shoulder pain. The shoulder has been hurting for several weeks with no history of any injury. It also hurts in the clavicle area and in the supra-scapula area. He does have a history of a hiatal hernia and has been taking some Aleve off and on without any problems with a hiatal hernia. He takes Nexium for her stomach.      Patient Active Problem List   Diagnosis Date Noted  . Hyperlipemia 12/30/2012  . Hypertension 12/30/2012  . BPH (benign prostatic hypertrophy) 12/30/2012  . Diabetes type 2, controlled 09/28/2012  . RBBB 04/11/2010  . CARDIOVASCULAR FUNCTION STUDY, ABNORMAL 04/11/2010  . TUBULOVILLOUS ADENOMA, COLON 09/14/2007  . HIATAL HERNIA, with GERD 09/14/2007  . NEPHROLITHIASIS 09/14/2007   Outpatient Encounter Prescriptions as of 09/16/2013  Medication Sig  . aspirin 81 MG tablet Take 81 mg by mouth daily.    . cholecalciferol (VITAMIN D) 1000 UNITS tablet Take 2,000 Units by mouth daily.   . Cinnamon 500 MG TABS Take 1,000 tablets by mouth 2 (two) times daily.  Marland Kitchen glucose blood (ONE TOUCH ULTRA TEST) test strip Check blood sugar qid and prn  . Melatonin 5 MG TABS Take 1 tablet by mouth daily.    . metFORMIN (GLUCOPHAGE) 500 MG tablet TAKE ONE TABLET BY MOUTH TWICE A DAY AFTER MEALS  . NEXIUM 40 MG capsule TAKE ONE CAPSULE BY MOUTH ONE TIME DAILY  . ONETOUCH DELICA LANCETS 74Q MISC   . simvastatin (ZOCOR) 40 MG tablet Take 40 mg by mouth every evening.  . valsartan-hydrochlorothiazide (DIOVAN-HCT) 320-25 MG per tablet TAKE ONE TABLET BY MOUTH ONE TIME DAILY    Review of Systems  Constitutional: Negative.   HENT: Negative.   Eyes: Negative.   Respiratory: Negative.   Cardiovascular: Negative.   Gastrointestinal: Negative.   Endocrine: Negative.   Genitourinary: Negative.   Musculoskeletal: Positive for arthralgias (right shoulder pain).    Skin: Negative.   Allergic/Immunologic: Negative.   Neurological: Negative.   Hematological: Negative.   Psychiatric/Behavioral: Negative.        Objective:   Physical Exam  Constitutional: He is oriented to person, place, and time. He appears well-developed and well-nourished. No distress.  HENT:  Head: Normocephalic.  Eyes: Conjunctivae and EOM are normal. Pupils are equal, round, and reactive to light. Right eye exhibits no discharge. Left eye exhibits no discharge. No scleral icterus.  Neck: Normal range of motion.  Musculoskeletal: Normal range of motion. He exhibits tenderness (tender rightanterior shoulder also tender at right a.c. joint and patient mentions that he has pain in  the right supra-scapula area).  Neurological: He is alert and oriented to person, place, and time.  Skin: Skin is warm and dry. No rash noted.  Psychiatric: He has a normal mood and affect. His behavior is normal. Judgment and thought content normal.   BP 157/75  Pulse 49  Temp(Src) 97.5 F (36.4 C) (Oral)  Ht 5' 8.5" (1.74 m)  Wt 190 lb (86.183 kg)  BMI 28.47 kg/m2   [facility] reading (PRIMARY) by  Dr. Governor Rooks shoulder--degenerative change                                       Assessment & Plan:  1. Right shoulder pain -  DG Shoulder Right; Future - meloxicam (MOBIC) 7.5 MG tablet; Take 1 tablet (7.5 mg total) by mouth daily.  Dispense: 30 tablet; Refill: 0 Patient Instructions  Use warm wet compresses 20 minutes 3 or 4 times daily with gentle range of motion exercises Take medication as directed after eating Make sure you also take a Zantac before supper at night Continue to take Nexium before breakfast in the morning   Arrie Senate MD

## 2013-09-16 NOTE — Patient Instructions (Signed)
Use warm wet compresses 20 minutes 3 or 4 times daily with gentle range of motion exercises Take medication as directed after eating Make sure you also take a Zantac before supper at night Continue to take Nexium before breakfast in the morning

## 2013-09-28 ENCOUNTER — Ambulatory Visit: Payer: BC Managed Care – PPO | Admitting: Family Medicine

## 2013-10-04 ENCOUNTER — Encounter: Payer: Self-pay | Admitting: Family Medicine

## 2013-10-04 ENCOUNTER — Ambulatory Visit (INDEPENDENT_AMBULATORY_CARE_PROVIDER_SITE_OTHER): Payer: Medicare Other | Admitting: Family Medicine

## 2013-10-04 VITALS — BP 136/67 | HR 49 | Temp 98.1°F | Ht 68.5 in | Wt 192.0 lb

## 2013-10-04 DIAGNOSIS — I10 Essential (primary) hypertension: Secondary | ICD-10-CM

## 2013-10-04 DIAGNOSIS — E785 Hyperlipidemia, unspecified: Secondary | ICD-10-CM

## 2013-10-04 DIAGNOSIS — E119 Type 2 diabetes mellitus without complications: Secondary | ICD-10-CM

## 2013-10-04 DIAGNOSIS — E559 Vitamin D deficiency, unspecified: Secondary | ICD-10-CM

## 2013-10-04 DIAGNOSIS — N400 Enlarged prostate without lower urinary tract symptoms: Secondary | ICD-10-CM

## 2013-10-04 NOTE — Progress Notes (Signed)
Subjective:    [name], male    DOB: [birth year], 73 y.o.   MRN: [id number]  HPI   Pt here for follow up and management of chronic medical problems. The patient comes to the visit today with his wife. He is due to get fasting blood work. He is also due to get a colonoscopy in August or September of this year. The patient indicates that his blood sugars are running in the 120s fasting and usually in the 1:30 range 2 hours after eating. At the time they may run as high as 160-170.       Patient Active Problem List   Diagnosis Date Noted  . Hyperlipemia 12/30/2012  . Hypertension 12/30/2012  . BPH (benign prostatic hypertrophy) 12/30/2012  . Diabetes type 2, controlled 09/28/2012  . RBBB 04/11/2010  . CARDIOVASCULAR FUNCTION STUDY, ABNORMAL 04/11/2010  . TUBULOVILLOUS ADENOMA, COLON 09/14/2007  . HIATAL HERNIA, with GERD 09/14/2007  . NEPHROLITHIASIS 09/14/2007   Outpatient Encounter Prescriptions as of 10/04/2013  Medication Sig  . aspirin 81 MG tablet Take 81 mg by mouth daily.    . cholecalciferol (VITAMIN D) 1000 UNITS tablet Take 2,000 Units by mouth daily.   . Cinnamon 500 MG TABS Take 1,000 tablets by mouth 2 (two) times daily.  Marland Kitchen glucose blood (ONE TOUCH ULTRA TEST) test strip Check blood sugar qid and prn  . Melatonin 5 MG TABS Take 1 tablet by mouth daily.    . meloxicam (MOBIC) 7.5 MG tablet Take 1 tablet (7.5 mg total) by mouth daily.  . metFORMIN (GLUCOPHAGE) 500 MG tablet TAKE ONE TABLET BY MOUTH TWICE A DAY AFTER MEALS  . NEXIUM 40 MG capsule TAKE ONE CAPSULE BY MOUTH ONE TIME DAILY  . ONETOUCH DELICA LANCETS 63S MISC   . simvastatin (ZOCOR) 40 MG tablet Take 40 mg by mouth every evening.  . valsartan-hydrochlorothiazide (DIOVAN-HCT) 320-25 MG per tablet TAKE ONE TABLET BY MOUTH ONE TIME DAILY    Review of Systems  Constitutional: Negative.   HENT: Negative.   Eyes: Negative.   Respiratory: Negative.   Cardiovascular: Negative.     Gastrointestinal: Negative.   Endocrine: Negative.   Genitourinary: Negative.   Musculoskeletal: Negative.   Skin: Negative.   Allergic/Immunologic: Negative.   Neurological: Negative.   Hematological: Negative.   Psychiatric/Behavioral: Negative.        Objective:   Physical Exam  Nursing note and vitals reviewed. Constitutional: He is oriented to person, place, and time. He appears well-developed and well-nourished. No distress.  HENT:  Head: Normocephalic and atraumatic.  Right Ear: External ear normal.  Left Ear: External ear normal.  Nose: Nose normal.  Mouth/Throat: Oropharynx is clear and moist. No oropharyngeal exudate.  Eyes: Conjunctivae and EOM are normal. Pupils are equal, round, and reactive to light. Right eye exhibits no discharge. Left eye exhibits no discharge. No scleral icterus.  Neck: Normal range of motion. Neck supple. No tracheal deviation present. No thyromegaly present.  No carotid bruits  Cardiovascular: Normal rate, regular rhythm, normal heart sounds and intact distal pulses.  Exam reveals no gallop and no friction rub.   No murmur heard. Regular rate and rhythm at 72 per min  Pulmonary/Chest: Effort normal and breath sounds normal. No respiratory distress. He has no wheezes. He has no rales. He exhibits no tenderness.  Abdominal: Soft. Bowel sounds are normal. He exhibits no mass. There is no tenderness. There is no rebound and no guarding.  Musculoskeletal: Normal range of  motion. He exhibits no edema and no tenderness.  Lymphadenopathy:    He has no cervical adenopathy.  Neurological: He is alert and oriented to person, place, and time. He has normal reflexes. No cranial nerve deficit.  Skin: Skin is warm and dry. No rash noted. No erythema. No pallor.  The scan is dry in general  Psychiatric: He has a normal mood and affect. His behavior is normal. Judgment and thought content normal.   BP 136/67  Pulse 49  Temp(Src) 98.1 F (36.7 C) (Oral)   Ht 5' 8.5" (1.74 m)  Wt 192 lb (87.091 kg)  BMI 28.77 kg/m2        Assessment & Plan:  1. BPH (benign prostatic hypertrophy) - POCT CBC; Future  2. Diabetes type 2, controlled - POCT CBC; Future - POCT glycosylated hemoglobin (Hb A1C); Future  3. Hyperlipemia - POCT CBC; Future - NMR, lipoprofile; Future  4. Hypertension - POCT CBC; Future - BMP8+EGFR; Future - Hepatic function panel; Future  5. Vitamin D deficiency - POCT CBC; Future - Vit D  25 hydroxy (rtn osteoporosis monitoring); Future  Patient Instructions                       Medicare Annual Wellness Visit  [facility] and the medical providers at [facility] strive to bring you the best medical care.  In doing so we not only want to address your current medical conditions and concerns but also to detect new conditions early and prevent illness, disease and health-related problems.    Medicare offers a yearly Wellness Visit which allows our clinical staff to assess your need for preventative services including immunizations, lifestyle education, counseling to decrease risk of preventable diseases and screening for fall risk and other medical concerns.    This visit is provided free of charge (no copay) for all Medicare recipients. The clinical pharmacists at [facility] have begun to conduct these Wellness Visits which will also include a thorough review of all your medications.    As you primary medical provider recommend that you make an appointment for your Annual Wellness Visit if you have not done so already this year.  You may set up this appointment before you leave today or you may call back ([phone]) and schedule an appointment.  Please make sure when you call that you mention that you are scheduling your Annual Wellness Visit with the clinical pharmacist so that the appointment may be made for the proper length of time.      Continue current  medications. Continue good therapeutic lifestyle changes which include good diet and exercise. Fall precautions discussed with patient. If an FOBT was given today- please return it to our front desk. If you are over 67 years old - you may need Prevnar 74 or the adult Pneumonia vaccine.  Reduce alcohol intake as much as possible Continue to monitor blood sugars Do not forget to get your colonoscopy in August or September If the epigastric pain continues we may want to consider getting an endoscopy at the same time Continue to monitor your feet and make sure there are no sores developing Continue to take the Nexium regularly   Arrie Senate MD

## 2013-10-04 NOTE — Patient Instructions (Addendum)
Medicare Annual Wellness Visit  [facility] and the medical providers at [facility] strive to bring you the best medical care.  In doing so we not only want to address your current medical conditions and concerns but also to detect new conditions early and prevent illness, disease and health-related problems.    Medicare offers a yearly Wellness Visit which allows our clinical staff to assess your need for preventative services including immunizations, lifestyle education, counseling to decrease risk of preventable diseases and screening for fall risk and other medical concerns.    This visit is provided free of charge (no copay) for all Medicare recipients. The clinical pharmacists at [facility] have begun to conduct these Wellness Visits which will also include a thorough review of all your medications.    As you primary medical provider recommend that you make an appointment for your Annual Wellness Visit if you have not done so already this year.  You may set up this appointment before you leave today or you may call back ([phone]) and schedule an appointment.  Please make sure when you call that you mention that you are scheduling your Annual Wellness Visit with the clinical pharmacist so that the appointment may be made for the proper length of time.      Continue current medications. Continue good therapeutic lifestyle changes which include good diet and exercise. Fall precautions discussed with patient. If an FOBT was given today- please return it to our front desk. If you are over 6 years old - you may need Prevnar 16 or the adult Pneumonia vaccine.  Reduce alcohol intake as much as possible Continue to monitor blood sugars Do not forget to get your colonoscopy in August or September If the epigastric pain continues we may want to consider getting an endoscopy at the same time Continue to monitor your feet and make  sure there are no sores developing Continue to take the Nexium regularly

## 2013-10-05 ENCOUNTER — Other Ambulatory Visit (INDEPENDENT_AMBULATORY_CARE_PROVIDER_SITE_OTHER): Payer: Medicare Other

## 2013-10-05 DIAGNOSIS — E559 Vitamin D deficiency, unspecified: Secondary | ICD-10-CM | POA: Diagnosis not present

## 2013-10-05 DIAGNOSIS — I10 Essential (primary) hypertension: Secondary | ICD-10-CM | POA: Diagnosis not present

## 2013-10-05 DIAGNOSIS — N400 Enlarged prostate without lower urinary tract symptoms: Secondary | ICD-10-CM | POA: Diagnosis not present

## 2013-10-05 DIAGNOSIS — E785 Hyperlipidemia, unspecified: Secondary | ICD-10-CM

## 2013-10-05 DIAGNOSIS — E119 Type 2 diabetes mellitus without complications: Secondary | ICD-10-CM | POA: Diagnosis not present

## 2013-10-05 LAB — POCT GLYCOSYLATED HEMOGLOBIN (HGB A1C): Hemoglobin A1C: 5.8

## 2013-10-05 NOTE — Addendum Note (Signed)
Addended by: Pollyann Kennedy F on: 10/05/2013 01:39 PM   Modules accepted: Orders

## 2013-10-05 NOTE — Progress Notes (Signed)
Pt came in for labs only 

## 2013-10-06 LAB — CBC WITH DIFFERENTIAL
BASOS ABS: 0.1 10*3/uL (ref 0.0–0.2)
Basos: 1 %
EOS ABS: 0.4 10*3/uL (ref 0.0–0.4)
Eos: 8 %
HCT: 36.5 % — ABNORMAL LOW (ref 37.5–51.0)
Hemoglobin: 12.6 g/dL (ref 12.6–17.7)
IMMATURE GRANS (ABS): 0 10*3/uL (ref 0.0–0.1)
IMMATURE GRANULOCYTES: 0 %
LYMPHS: 20 %
Lymphocytes Absolute: 1 10*3/uL (ref 0.7–3.1)
MCH: 32.7 pg (ref 26.6–33.0)
MCHC: 34.5 g/dL (ref 31.5–35.7)
MCV: 95 fL (ref 79–97)
Monocytes Absolute: 0.4 10*3/uL (ref 0.1–0.9)
Monocytes: 8 %
NEUTROS PCT: 63 %
Neutrophils Absolute: 3.2 10*3/uL (ref 1.4–7.0)
PLATELETS: 106 10*3/uL — AB (ref 150–379)
RBC: 3.85 x10E6/uL — ABNORMAL LOW (ref 4.14–5.80)
RDW: 14 % (ref 12.3–15.4)
WBC: 5.1 10*3/uL (ref 3.4–10.8)

## 2013-10-06 LAB — HEPATIC FUNCTION PANEL
ALBUMIN: 4.4 g/dL (ref 3.5–4.8)
ALK PHOS: 53 IU/L (ref 39–117)
ALT: 19 IU/L (ref 0–44)
AST: 20 IU/L (ref 0–40)
Bilirubin, Direct: 0.2 mg/dL (ref 0.00–0.40)
Total Bilirubin: 0.9 mg/dL (ref 0.0–1.2)
Total Protein: 6.5 g/dL (ref 6.0–8.5)

## 2013-10-06 LAB — BMP8+EGFR
BUN / CREAT RATIO: 20 (ref 10–22)
BUN: 25 mg/dL (ref 8–27)
CO2: 24 mmol/L (ref 18–29)
Calcium: 9.2 mg/dL (ref 8.6–10.2)
Chloride: 99 mmol/L (ref 97–108)
Creatinine, Ser: 1.26 mg/dL (ref 0.76–1.27)
GFR calc non Af Amer: 57 mL/min/{1.73_m2} — ABNORMAL LOW (ref >59)
GFR, EST AFRICAN AMERICAN: 65 mL/min/{1.73_m2} (ref >59)
Glucose: 116 mg/dL — ABNORMAL HIGH (ref 65–99)
POTASSIUM: 4.7 mmol/L (ref 3.5–5.2)
Sodium: 138 mmol/L (ref 134–144)

## 2013-10-06 LAB — NMR, LIPOPROFILE
Cholesterol: 128 mg/dL (ref <200)
HDL Cholesterol by NMR: 43 mg/dL (ref >=40)
HDL PARTICLE NUMBER: 32 umol/L (ref >=30.5)
LDL Particle Number: 728 nmol/L (ref <1000)
LDL Size: 20.6 nm (ref >20.5)
LDLC SERPL CALC-MCNC: 58 mg/dL (ref <100)
LP-IR SCORE: 65 — AB (ref <=45)
Small LDL Particle Number: 367 nmol/L (ref <=527)
Triglycerides by NMR: 134 mg/dL (ref <150)

## 2013-10-06 LAB — VITAMIN D 25 HYDROXY (VIT D DEFICIENCY, FRACTURES): VIT D 25 HYDROXY: 47.2 ng/mL (ref 30.0–100.0)

## 2013-10-08 ENCOUNTER — Encounter: Payer: Self-pay | Admitting: Family

## 2013-10-08 ENCOUNTER — Ambulatory Visit (INDEPENDENT_AMBULATORY_CARE_PROVIDER_SITE_OTHER): Payer: Medicare Other | Admitting: Family

## 2013-10-08 VITALS — BP 129/62 | HR 54 | Temp 97.3°F | Wt 192.0 lb

## 2013-10-08 DIAGNOSIS — S30860A Insect bite (nonvenomous) of lower back and pelvis, initial encounter: Secondary | ICD-10-CM

## 2013-10-08 DIAGNOSIS — W57XXXA Bitten or stung by nonvenomous insect and other nonvenomous arthropods, initial encounter: Principal | ICD-10-CM

## 2013-10-08 MED ORDER — DOXYCYCLINE HYCLATE 100 MG PO TABS: 200.0000 mg | ORAL_TABLET | Freq: Once | ORAL | Status: DC

## 2013-10-08 NOTE — Patient Instructions (Signed)
Tick Bite Information Ticks are insects that attach themselves to the skin and draw blood for food. There are various types of ticks. Common types include wood ticks and deer ticks. Most ticks live in shrubs and grassy areas. Ticks can climb onto your body when you make contact with leaves or grass where the tick is waiting. The most common places on the body for ticks to attach themselves are the scalp, neck, armpits, waist, and groin. Most tick bites are harmless, but sometimes ticks carry germs that cause diseases. These germs can be spread to a person during the tick's feeding process. The chance of a disease spreading through a tick bite depends on:   The type of tick.  Time of year.   How long the tick is attached.   Geographic location.  HOW CAN YOU PREVENT TICK BITES? Take these steps to help prevent tick bites when you are outdoors:  Wear protective clothing. Long sleeves and long pants are best.   Wear white clothes so you can see ticks more easily.  Tuck your pant legs into your socks.   If walking on a trail, stay in the middle of the trail to avoid brushing against bushes.  Avoid walking through areas with long grass.  Put insect repellent on all exposed skin and along boot tops, pant legs, and sleeve cuffs.   Check clothing, hair, and skin repeatedly and before going inside.   Brush off any ticks that are not attached.  Take a shower or bath as soon as possible after being outdoors.  WHAT IS THE PROPER WAY TO REMOVE A TICK? Ticks should be removed as soon as possible to help prevent diseases caused by tick bites. 1. If latex gloves are available, put them on before trying to remove a tick.  2. Using fine-point tweezers, grasp the tick as close to the skin as possible. You may also use curved forceps or a tick removal tool. Grasp the tick as close to its head as possible. Avoid grasping the tick on its body. 3. Pull gently with steady upward pressure until  the tick lets go. Do not twist the tick or jerk it suddenly. This may break off the tick's head or mouth parts. 4. Do not squeeze or crush the tick's body. This could force disease-carrying fluids from the tick into your body.  5. After the tick is removed, wash the bite area and your hands with soap and water or other disinfectant such as alcohol. 6. Apply a small amount of antiseptic cream or ointment to the bite site.  7. Wash and disinfect any instruments that were used.  Do not try to remove a tick by applying a hot match, petroleum jelly, or fingernail polish to the tick. These methods do not work and may increase the chances of disease being spread from the tick bite.  WHEN SHOULD YOU SEEK MEDICAL CARE? Contact your health care provider if you are unable to remove a tick from your skin or if a part of the tick breaks off and is stuck in the skin.  After a tick bite, you need to be aware of signs and symptoms that could be related to diseases spread by ticks. Contact your health care provider if you develop any of the following in the days or weeks after the tick bite:  Unexplained fever.  Rash. A circular rash that appears days or weeks after the tick bite may indicate the possibility of Lyme disease. The rash may resemble   a target with a bull's-eye and may occur at a different part of your body than the tick bite.  Redness and swelling in the area of the tick bite.   Tender, swollen lymph glands.   Diarrhea.   Weight loss.   Cough.   Fatigue.   Muscle, joint, or bone pain.   Abdominal pain.   Headache.   Lethargy or a change in your level of consciousness.  Difficulty walking or moving your legs.   Numbness in the legs.   Paralysis.  Shortness of breath.   Confusion.   Repeated vomiting.  Document Released: 05/17/2000 Document Revised: 03/10/2013 Document Reviewed: 10/28/2012 Exit[employer] Patient Information 2014 [employer].  

## 2013-10-08 NOTE — Progress Notes (Signed)
   Subjective:    [name], male    DOB: 12/23/1940, 73 y.o.   MRN: [id number]  HPI Pt presents to office after removing a tick off bottom last night. Pt states he believes it had been there a "few days because I was itching". Pt denies any pain. Only complains of some slight redness.   Review of Systems  All other systems reviewed and are negative.      Objective:   Physical Exam  Vitals reviewed. Constitutional: He is oriented to person, place, and time. He appears well-developed and well-nourished. No distress.  Neck: Normal range of motion. Neck supple.  Cardiovascular: Normal rate, regular rhythm, normal heart sounds and intact distal pulses.   No murmur heard. Pulmonary/Chest: Effort normal and breath sounds normal. No respiratory distress. He has no wheezes.  Abdominal: Soft. Bowel sounds are normal. He exhibits no distension. There is no tenderness.  Musculoskeletal: Normal range of motion. He exhibits no edema and no tenderness.  Neurological: He is alert and oriented to person, place, and time. He has normal reflexes. No cranial nerve deficit.  Skin: Skin is warm and dry. No rash noted. There is erythema.  Small erythemas circular section on right gluteal fold  Psychiatric: He has a normal mood and affect. His behavior is normal. Judgment and thought content normal.         BP 129/62  Pulse 54  Temp(Src) 97.3 F (36.3 C) (Oral)  Wt 192 lb (87.091 kg)  Assessment & Plan:  1. Tick bite of buttock -Wear protective clothing when outside -Check body for ticks daily -Report any fevers, rash, or joint pain Meds ordered this encounter  Medications  . doxycycline (VIBRA-TABS) 100 MG tablet    Sig: Take 2 tablets (200 mg total) by mouth once.    Dispense:  2 tablet    Refill:  0    Order Specific Question:  Supervising Provider    Answer:  Chipper Herb [[id number]]    Evelina Dun, FNP

## 2013-10-13 ENCOUNTER — Other Ambulatory Visit: Payer: Self-pay | Admitting: Family Medicine

## 2013-10-19 ENCOUNTER — Other Ambulatory Visit: Payer: Self-pay | Admitting: Family Medicine

## 2013-10-20 ENCOUNTER — Telehealth: Payer: Self-pay | Admitting: Family Medicine

## 2013-10-20 MED ORDER — SIMVASTATIN 40 MG PO TABS: 40.0000 mg | ORAL_TABLET | Freq: Every evening | ORAL | Status: DC

## 2013-10-20 MED ORDER — DOXYCYCLINE HYCLATE 100 MG PO TABS
100.0000 mg | ORAL_TABLET | Freq: Two times a day (BID) | ORAL | Status: DC
Start: 2013-10-20 — End: 2013-11-18

## 2013-10-20 NOTE — Telephone Encounter (Signed)
Call a prescription in for doxycycline 100 mg #28 one twice daily with food for infection until completed

## 2013-10-20 NOTE — Telephone Encounter (Signed)
Bit by approx 10 ticks so far- seen [facility] for one of them and she gave him a one time Doxy dose. Patient thinks he needs a round of DOXY because he has had so many. Right now they are just red, itching- no fever, dizziness, or nausea Call on thurs if we can do this

## 2013-10-20 NOTE — Telephone Encounter (Signed)
Pt aware Simvastatin rf done as well

## 2013-11-18 ENCOUNTER — Encounter: Payer: Self-pay | Admitting: Physician Assistant

## 2013-11-18 ENCOUNTER — Ambulatory Visit (INDEPENDENT_AMBULATORY_CARE_PROVIDER_SITE_OTHER): Payer: Medicare Other | Admitting: Physician Assistant

## 2013-11-18 VITALS — BP 142/68 | HR 54 | Temp 97.8°F | Ht 68.5 in | Wt 193.2 lb

## 2013-11-18 DIAGNOSIS — W57XXXA Bitten or stung by nonvenomous insect and other nonvenomous arthropods, initial encounter: Secondary | ICD-10-CM

## 2013-11-18 DIAGNOSIS — T148 Other injury of unspecified body region: Secondary | ICD-10-CM | POA: Diagnosis not present

## 2013-11-18 MED ORDER — DOXYCYCLINE HYCLATE 100 MG PO TABS: 100.0000 mg | ORAL_TABLET | Freq: Two times a day (BID) | ORAL | Status: DC

## 2013-11-18 NOTE — Progress Notes (Signed)
Subjective:     Patient ID: ROY TOKARZ, male   DOB: [birth year], 73 y.o.   MRN: [id number]  HPI Pt with rash to the lateral R buttocks States he has removed multiple attached ticks over the last month   Review of Systems No pain or drainage from the site + Pruritus    Objective:   Physical Exam Circ. erythem rash to the lateral R buttocks No ulceration or drainage noted No area of fluct or tenderness + multiple bite sites distal    Assessment:     Tick bite    Plan:     Given appearance Doxycycline 100mg  bid x 14 days Sun precautions reviewed Avoidance measures discussed F/U prn

## 2013-11-18 NOTE — Patient Instructions (Signed)
Tick Bite Information Ticks are insects that attach themselves to the skin and draw blood for food. There are various types of ticks. Common types include wood ticks and deer ticks. Most ticks live in shrubs and grassy areas. Ticks can climb onto your body when you make contact with leaves or grass where the tick is waiting. The most common places on the body for ticks to attach themselves are the scalp, neck, armpits, waist, and groin. Most tick bites are harmless, but sometimes ticks carry germs that cause diseases. These germs can be spread to a person during the tick's feeding process. The chance of a disease spreading through a tick bite depends on:   The type of tick.  Time of year.   How long the tick is attached.   Geographic location.  HOW CAN YOU PREVENT TICK BITES? Take these steps to help prevent tick bites when you are outdoors:  Wear protective clothing. Long sleeves and long pants are best.   Wear white clothes so you can see ticks more easily.  Tuck your pant legs into your socks.   If walking on a trail, stay in the middle of the trail to avoid brushing against bushes.  Avoid walking through areas with long grass.  Put insect repellent on all exposed skin and along boot tops, pant legs, and sleeve cuffs.   Check clothing, hair, and skin repeatedly and before going inside.   Brush off any ticks that are not attached.  Take a shower or bath as soon as possible after being outdoors.  WHAT IS THE PROPER WAY TO REMOVE A TICK? Ticks should be removed as soon as possible to help prevent diseases caused by tick bites. 1. If latex gloves are available, put them on before trying to remove a tick.  2. Using fine-point tweezers, grasp the tick as close to the skin as possible. You may also use curved forceps or a tick removal tool. Grasp the tick as close to its head as possible. Avoid grasping the tick on its body. 3. Pull gently with steady upward pressure until  the tick lets go. Do not twist the tick or jerk it suddenly. This may break off the tick's head or mouth parts. 4. Do not squeeze or crush the tick's body. This could force disease-carrying fluids from the tick into your body.  5. After the tick is removed, wash the bite area and your hands with soap and water or other disinfectant such as alcohol. 6. Apply a small amount of antiseptic cream or ointment to the bite site.  7. Wash and disinfect any instruments that were used.  Do not try to remove a tick by applying a hot match, petroleum jelly, or fingernail polish to the tick. These methods do not work and may increase the chances of disease being spread from the tick bite.  WHEN SHOULD YOU SEEK MEDICAL CARE? Contact your health care [facility] [place]le[facility]ite if you are unable to remove a tick from your skin or if a part of the tick breaks off and is stuck in the skin.  After a tick bite, you need to be aware of signs and symptoms that could be related to diseases spread by ticks. Contact your health care Marilea Gwynne if you develop any of the following in the days or weeks after the tick bite:  Unexplained fever.  Rash. A circular rash that appears days or weeks after the tick bite may indicate the possibility of Lyme disease. The rash may resemble   a target with a bull's-eye and may occur at a different part of your body than the tick bite.  Redness and swelling in the area of the tick bite.   Tender, swollen lymph glands.   Diarrhea.   Weight loss.   Cough.   Fatigue.   Muscle, joint, or bone pain.   Abdominal pain.   Headache.   Lethargy or a change in your level of consciousness.  Difficulty walking or moving your legs.   Numbness in the legs.   Paralysis.  Shortness of breath.   Confusion.   Repeated vomiting.  Document Released: 05/17/2000 Document Revised: 03/10/2013 Document Reviewed: 10/28/2012 ExitCare Patient Information 2015 [employer]. This information is  not intended to replace advice given to you by your health care Kacper Cartlidge. Make sure you discuss any questions you have with your health care Lawrance Wiedemann.  

## 2013-11-19 ENCOUNTER — Encounter: Payer: Self-pay | Admitting: *Deleted

## 2013-11-23 ENCOUNTER — Telehealth: Payer: Self-pay | Admitting: Family Medicine

## 2013-11-23 NOTE — Telephone Encounter (Signed)
Patient aware that being in the sun can cause a rash and to try some benadryl to help with the rash and if the rash spreads or gets worse to please call the office

## 2013-11-29 ENCOUNTER — Encounter: Payer: Self-pay | Admitting: Internal Medicine

## 2013-12-08 ENCOUNTER — Telehealth: Payer: Self-pay | Admitting: Family Medicine

## 2013-12-08 NOTE — Telephone Encounter (Signed)
Patient aware 03/13/11

## 2013-12-10 ENCOUNTER — Other Ambulatory Visit: Payer: Self-pay | Admitting: Family Medicine

## 2013-12-14 ENCOUNTER — Ambulatory Visit (INDEPENDENT_AMBULATORY_CARE_PROVIDER_SITE_OTHER): Payer: Medicare Other

## 2013-12-14 ENCOUNTER — Ambulatory Visit (INDEPENDENT_AMBULATORY_CARE_PROVIDER_SITE_OTHER): Payer: Medicare Other | Admitting: Family Medicine

## 2013-12-14 ENCOUNTER — Encounter: Payer: Self-pay | Admitting: Family Medicine

## 2013-12-14 VITALS — BP 148/71 | HR 51 | Temp 98.6°F | Ht 68.5 in | Wt 194.0 lb

## 2013-12-14 DIAGNOSIS — M25569 Pain in unspecified knee: Secondary | ICD-10-CM | POA: Diagnosis not present

## 2013-12-14 DIAGNOSIS — T148XXA Other injury of unspecified body region, initial encounter: Secondary | ICD-10-CM | POA: Diagnosis not present

## 2013-12-14 DIAGNOSIS — M25561 Pain in right knee: Secondary | ICD-10-CM

## 2013-12-14 DIAGNOSIS — IMO0002 Reserved for concepts with insufficient information to code with codable children: Secondary | ICD-10-CM

## 2013-12-14 MED ORDER — CEPHALEXIN 500 MG PO CAPS: 500.0000 mg | ORAL_CAPSULE | Freq: Two times a day (BID) | ORAL | Status: DC

## 2013-12-14 NOTE — Progress Notes (Signed)
Subjective:    [name], male    DOB: 1941/04/10, 73 y.o.   MRN: [id number]  HPI Patient here today for a laceration to right pointer finger that was cut on a piece of tin this morning. He also complains of right knee pain from where he hyperextended it about 3 months ago while walking [place] a creek and stepping on a rock.         Patient Active Problem List   Diagnosis Date Noted  . Hyperlipemia 12/30/2012  . Hypertension 12/30/2012  . BPH (benign prostatic hypertrophy) 12/30/2012  . Diabetes type 2, controlled 09/28/2012  . RBBB 04/11/2010  . CARDIOVASCULAR FUNCTION STUDY, ABNORMAL 04/11/2010  . TUBULOVILLOUS ADENOMA, COLON 09/14/2007  . HIATAL HERNIA, with GERD 09/14/2007  . NEPHROLITHIASIS 09/14/2007   Outpatient Encounter Prescriptions as of 12/14/2013  Medication Sig  . aspirin 81 MG tablet Take 81 mg by mouth daily.    . cholecalciferol (VITAMIN D) 1000 UNITS tablet Take 2,000 Units by mouth daily.   . Cinnamon 500 MG TABS Take 1,000 tablets by mouth 2 (two) times daily.  Marland Kitchen glucose blood (ONE TOUCH ULTRA TEST) test strip Check blood sugar qid and prn  . Melatonin 5 MG TABS Take 1 tablet by mouth daily.    . meloxicam (MOBIC) 7.5 MG tablet TAKE 1 TABLET (7.5 MG TOTAL)  BY MOUTH DAILY.  . metFORMIN (GLUCOPHAGE) 500 MG tablet TAKE ONE TABLET BY MOUTH TWICE DAILY AFTER MEALS  . NEXIUM 40 MG capsule TAKE ONE CAPSULE BY MOUTH ONE TIME DAILY  . ONETOUCH DELICA LANCETS 94W MISC   . simvastatin (ZOCOR) 40 MG tablet Take 1 tablet (40 mg total) by mouth every evening.  . valsartan-hydrochlorothiazide (DIOVAN-HCT) 320-25 MG per tablet TAKE ONE TABLET BY MOUTH ONE TIME DAILY  . [DISCONTINUED] doxycycline (VIBRA-TABS) 100 MG tablet Take 1 tablet (100 mg total) by mouth 2 (two) times daily.    Review of Systems  Constitutional: Negative.   HENT: Negative.   Eyes: Negative.   Respiratory: Negative.   Cardiovascular: Negative.   Gastrointestinal: Negative.     Endocrine: Negative.   Genitourinary: Negative.   Musculoskeletal: Positive for arthralgias (right knee pain).  Skin: Positive for wound (right pointer finger laceration.).  Allergic/Immunologic: Negative.   Neurological: Negative.   Hematological: Negative.   Psychiatric/Behavioral: Negative.        Objective:   Physical Exam  Nursing note and vitals reviewed. Constitutional: He is oriented to person, place, and time. He appears well-developed and well-nourished. No distress.  HENT:  Head: Normocephalic.  Eyes: Conjunctivae and EOM are normal. Pupils are equal, round, and reactive to light.  Neck: Normal range of motion.  Musculoskeletal: Normal range of motion. He exhibits tenderness. He exhibits no edema.  The medial joint line of the right knee was tender to palpation. Internal and external rotation did not make the pain any worse.  Neurological: He is alert and oriented to person, place, and time.  Skin: Skin is warm and dry. No rash noted.  There was a laceration to the side of the nail on the right index finger which appeared to be well approximated without bleeding. Steri-Strips were applied to this area. The length of the laceration was about 1/2 inch. 2 Steri-Strips will be applied to this after cleansing with Betadine.  Psychiatric: He has a normal mood and affect. His behavior is normal. Judgment and thought content normal.   BP 148/71  Pulse 51  Temp(Src) 98.6 F (  37 C) (Oral)  Ht 5' 8.5" (1.74 m)  Wt 194 lb (87.998 kg)  BMI 29.07 kg/m2        Assessment & Plan:  1. Right knee pain - DG Knee 1-2 Views Right; Future  2. Laceration - cephALEXin (KEFLEX) 500 MG capsule; Take 1 capsule (500 mg total) by mouth 2 (two) times daily.  Dispense: 14 capsule; Refill: 0 Patient Instructions  Laceration Care, Adult A laceration is a cut or lesion that goes through all layers of the skin and into the tissue just beneath the skin. TREATMENT  Some lacerations may not  require closure. Some lacerations may not be able to be closed due to an increased risk of infection. It is important to see your caregiver as soon as possible after an injury to minimize the risk of infection and maximize the opportunity for successful closure. If closure is appropriate, pain medicines may be given, if needed. The wound will be cleaned to help prevent infection. Your caregiver will use stitches (sutures), staples, wound glue (adhesive), or skin adhesive strips to repair the laceration. These tools bring the skin edges together to allow for faster healing and a better cosmetic outcome. However, all wounds will heal with a scar. Once the wound has healed, scarring can be minimized by covering the wound with sunscreen during the day for 1 full year. HOME CARE INSTRUCTIONS  For sutures or staples:  Keep the wound clean and dry.  If you were given a bandage (dressing), you should change it at least once a day. Also, change the dressing if it becomes wet or dirty, or as directed by your caregiver.  Wash the wound with soap and water 2 times a day. Rinse the wound off with water to remove all soap. Pat the wound dry with a clean towel.  After cleaning, apply a thin layer of the antibiotic ointment as recommended by your caregiver. This will help prevent infection and keep the dressing from sticking.  You may shower as usual after the first 24 hours. Do not soak the wound in water until the sutures are removed.  Only take over-the-counter or prescription medicines for pain, discomfort, or fever as directed by your caregiver.  Get your sutures or staples removed as directed by your caregiver. For skin adhesive strips:  Keep the wound clean and dry.  Do not get the skin adhesive strips wet. You may bathe carefully, using caution to keep the wound dry.  If the wound gets wet, pat it dry with a clean towel.  Skin adhesive strips will fall off on their own. You may trim the strips as  the wound heals. Do not remove skin adhesive strips that are still stuck to the wound. They will fall off in time. For wound adhesive:  You may briefly wet your wound in the shower or bath. Do not soak or scrub the wound. Do not swim. Avoid periods of heavy perspiration until the skin adhesive has fallen off on its own. After showering or bathing, gently pat the wound dry with a clean towel.  Do not apply liquid medicine, cream medicine, or ointment medicine to your wound while the skin adhesive is in place. This may loosen the film before your wound is healed.  If a dressing is placed over the wound, be careful not to apply tape directly over the skin adhesive. This may cause the adhesive to be pulled off before the wound is healed.  Avoid prolonged exposure to sunlight or tanning  lamps while the skin adhesive is in place. Exposure to ultraviolet light in the first year will darken the scar.  The skin adhesive will usually remain in place for 5 to 10 days, then naturally fall off the skin. Do not pick at the adhesive film. You may need a tetanus shot if:  You cannot remember when you had your last tetanus shot.  You have never had a tetanus shot. If you get a tetanus shot, your arm may swell, get red, and feel warm to the touch. This is common and not a problem. If you need a tetanus shot and you choose not to have one, there is a rare chance of getting tetanus. Sickness from tetanus can be serious. SEEK MEDICAL CARE IF:   You have redness, swelling, or increasing pain in the wound.  You see a red line that goes away from the wound.  You have yellowish-white fluid (pus) coming from the wound.  You have a fever.  You notice a bad smell coming from the wound or dressing.  Your wound breaks open before or after sutures have been removed.  You notice something coming out of the wound such as wood or glass.  Your wound is on your hand or foot and you cannot move a finger or toe. SEEK  IMMEDIATE MEDICAL CARE IF:   Your pain is not controlled with prescribed medicine.  You have severe swelling around the wound causing pain and numbness or a change in color in your arm, hand, leg, or foot.  Your wound splits open and starts bleeding.  You have worsening numbness, weakness, or loss of function of any joint around or beyond the wound.  You develop painful lumps near the wound or on the skin anywhere on your body. MAKE SURE YOU:   Understand these instructions.  Will watch your condition.  Will get help right away if you are not doing well or get worse. Document Released: 05/20/2005 Document Revised: 08/12/2011 Document Reviewed: 11/13/2010 [facility] Patient Information 2015 [place], [place]. This information is not intended to replace advice given to you by your health care provider. Make sure you discuss any questions you have with your health care provider.  Keep wound clean and dry for 5-7 days. We will get x-ray results back and most likely will schedule you for an MRI to further evaluate the knee pain Take antibiotic as directed   Arrie Senate MD

## 2013-12-14 NOTE — Patient Instructions (Addendum)
Laceration Care, Adult A laceration is a cut or lesion that goes through all layers of the skin and into the tissue just beneath the skin. TREATMENT  Some lacerations may not require closure. Some lacerations may not be able to be closed due to an increased risk of infection. It is important to see your caregiver as soon as possible after an injury to minimize the risk of infection and maximize the opportunity for successful closure. If closure is appropriate, pain medicines may be given, if needed. The wound will be cleaned to help prevent infection. Your caregiver will use stitches (sutures), staples, wound glue (adhesive), or skin adhesive strips to repair the laceration. These tools bring the skin edges together to allow for faster healing and a better cosmetic outcome. However, all wounds will heal with a scar. Once the wound has healed, scarring can be minimized by covering the wound with sunscreen during the day for 1 full year. HOME CARE INSTRUCTIONS  For sutures or staples:  Keep the wound clean and dry.  If you were given a bandage (dressing), you should change it at least once a day. Also, change the dressing if it becomes wet or dirty, or as directed by your caregiver.  Wash the wound with soap and water 2 times a day. Rinse the wound off with water to remove all soap. Pat the wound dry with a clean towel.  After cleaning, apply a thin layer of the antibiotic ointment as recommended by your caregiver. This will help prevent infection and keep the dressing from sticking.  You may shower as usual after the first 24 hours. Do not soak the wound in water until the sutures are removed.  Only take over-the-counter or prescription medicines for pain, discomfort, or fever as directed by your caregiver.  Get your sutures or staples removed as directed by your caregiver. For skin adhesive strips:  Keep the wound clean and dry.  Do not get the skin adhesive strips wet. You may bathe  carefully, using caution to keep the wound dry.  If the wound gets wet, pat it dry with a clean towel.  Skin adhesive strips will fall off on their own. You may trim the strips as the wound heals. Do not remove skin adhesive strips that are still stuck to the wound. They will fall off in time. For wound adhesive:  You may briefly wet your wound in the shower or bath. Do not soak or scrub the wound. Do not swim. Avoid periods of heavy perspiration until the skin adhesive has fallen off on its own. After showering or bathing, gently pat the wound dry with a clean towel.  Do not apply liquid medicine, cream medicine, or ointment medicine to your wound while the skin adhesive is in place. This may loosen the film before your wound is healed.  If a dressing is placed over the wound, be careful not to apply tape directly over the skin adhesive. This may cause the adhesive to be pulled off before the wound is healed.  Avoid prolonged exposure to sunlight or tanning lamps while the skin adhesive is in place. Exposure to ultraviolet light in the first year will darken the scar.  The skin adhesive will usually remain in place for 5 to 10 days, then naturally fall off the skin. Do not pick at the adhesive film. You may need a tetanus shot if:  You cannot remember when you had your last tetanus shot.  You have never had a tetanus  shot. If you get a tetanus shot, your arm may swell, get red, and feel warm to the touch. This is common and not a problem. If you need a tetanus shot and you choose not to have one, there is a rare chance of getting tetanus. Sickness from tetanus can be serious. SEEK MEDICAL CARE IF:   You have redness, swelling, or increasing pain in the wound.  You see a red line that goes away from the wound.  You have yellowish-white fluid (pus) coming from the wound.  You have a fever.  You notice a bad smell coming from the wound or dressing.  Your wound breaks open before or  after sutures have been removed.  You notice something coming out of the wound such as wood or glass.  Your wound is on your hand or foot and you cannot move a finger or toe. SEEK IMMEDIATE MEDICAL CARE IF:   Your pain is not controlled with prescribed medicine.  You have severe swelling around the wound causing pain and numbness or a change in color in your arm, hand, leg, or foot.  Your wound splits open and starts bleeding.  You have worsening numbness, weakness, or loss of function of any joint around or beyond the wound.  You develop painful lumps near the wound or on the skin anywhere on your body. MAKE SURE YOU:   Understand these instructions.  Will watch your condition.  Will get help right away if you are not doing well or get worse. Document Released: 05/20/2005 Document Revised: 08/12/2011 Document Reviewed: 11/13/2010 [facility] Patient Information 2015 [place], [place]. This information is not intended to replace advice given to you by your health care provider. Make sure you discuss any questions you have with your health care provider.  Keep wound clean and dry for 5-7 days. We will get x-ray results back and most likely will schedule you for an MRI to further evaluate the knee pain Take antibiotic as directed

## 2013-12-16 ENCOUNTER — Telehealth: Payer: Self-pay

## 2013-12-16 NOTE — Telephone Encounter (Signed)
Message copied by Koren Bound on Thu Dec 16, 2013  8:36 AM ------      Message from: Chipper Herb      Created: Wed Dec 15, 2013  1:04 PM       As per radiology report---- because of the persistent discomfort with this patient's knee, please schedule an MRI at [facility]       ------

## 2013-12-16 NOTE — Telephone Encounter (Signed)
Pt aware of knee xray results. MRI offered, pt  Declined at this time

## 2013-12-27 ENCOUNTER — Encounter: Payer: Self-pay | Admitting: Internal Medicine

## 2013-12-27 ENCOUNTER — Encounter: Payer: Self-pay | Admitting: Gastroenterology

## 2014-01-04 ENCOUNTER — Encounter: Payer: Self-pay | Admitting: Internal Medicine

## 2014-01-05 ENCOUNTER — Telehealth: Payer: Self-pay | Admitting: Family Medicine

## 2014-01-05 NOTE — Telephone Encounter (Signed)
Notes about wife- will be in her chart

## 2014-01-18 ENCOUNTER — Other Ambulatory Visit: Payer: Self-pay | Admitting: Family Medicine

## 2014-01-28 ENCOUNTER — Ambulatory Visit (INDEPENDENT_AMBULATORY_CARE_PROVIDER_SITE_OTHER): Payer: Medicare Other | Admitting: Family Medicine

## 2014-01-28 ENCOUNTER — Encounter: Payer: Self-pay | Admitting: Family Medicine

## 2014-01-28 VITALS — BP 146/61 | HR 58 | Temp 98.7°F | Ht 68.5 in | Wt 192.2 lb

## 2014-01-28 DIAGNOSIS — T148 Other injury of unspecified body region: Secondary | ICD-10-CM

## 2014-01-28 DIAGNOSIS — W57XXXA Bitten or stung by nonvenomous insect and other nonvenomous arthropods, initial encounter: Secondary | ICD-10-CM

## 2014-01-28 MED ORDER — DOXYCYCLINE HYCLATE 100 MG PO TABS: 100.0000 mg | ORAL_TABLET | Freq: Two times a day (BID) | ORAL | Status: DC

## 2014-01-28 NOTE — Progress Notes (Signed)
   Subjective:    Patient ID: SUBHAN HOOPES, male    DOB: [birth year], 73 y.o.   MRN: [id number]  HPI This 73 y.o. male presents for evaluation of tick bite.   Review of Systems No chest pain, SOB, HA, dizziness, vision change, N/V, diarrhea, constipation, dysuria, urinary urgency or frequency, myalgias, arthralgias or rash.     Objective:   Physical Exam   Vital signs noted  Well developed well nourished male.  HEENT - Head atraumatic Normocephalic                Eyes - PERRLA, Conjuctiva - clear Sclera- Clear EOMI                Ears - EAC's Wnl TM's Wnl Gross Hearing WNL                Throat - oropharanx wnl Respiratory - Lungs CTA bilateral Cardiac - RRR S1 and S2 without murmur GI - Abdomen soft Nontender and bowel sounds active x 4 Extremities - No edema. Neuro - Grossly intact.     Assessment & Plan:  Tick bite - Plan: doxycycline (VIBRA-TABS) 100 MG tablet po bid x 10 days  Lysbeth Penner FNP

## 2014-02-03 ENCOUNTER — Other Ambulatory Visit (INDEPENDENT_AMBULATORY_CARE_PROVIDER_SITE_OTHER): Payer: Medicare Other

## 2014-02-03 DIAGNOSIS — E559 Vitamin D deficiency, unspecified: Secondary | ICD-10-CM

## 2014-02-03 DIAGNOSIS — T148 Other injury of unspecified body region: Secondary | ICD-10-CM | POA: Diagnosis not present

## 2014-02-03 DIAGNOSIS — E119 Type 2 diabetes mellitus without complications: Secondary | ICD-10-CM

## 2014-02-03 DIAGNOSIS — I10 Essential (primary) hypertension: Secondary | ICD-10-CM | POA: Diagnosis not present

## 2014-02-03 DIAGNOSIS — W57XXXA Bitten or stung by nonvenomous insect and other nonvenomous arthropods, initial encounter: Secondary | ICD-10-CM

## 2014-02-03 DIAGNOSIS — E785 Hyperlipidemia, unspecified: Secondary | ICD-10-CM | POA: Diagnosis not present

## 2014-02-03 LAB — POCT CBC
GRANULOCYTE PERCENT: 78.8 % (ref 37–80)
HEMATOCRIT: 37 % — AB (ref 43.5–53.7)
Hemoglobin: 12.6 g/dL — AB (ref 14.1–18.1)
Lymph, poc: 0.9 (ref 0.6–3.4)
MCH, POC: 32 pg — AB (ref 27–31.2)
MCHC: 34.1 g/dL (ref 31.8–35.4)
MCV: 94 fL (ref 80–97)
MPV: 7.5 fL (ref 0–99.8)
POC Granulocyte: 4.6 (ref 2–6.9)
POC LYMPH PERCENT: 16.1 %L (ref 10–50)
Platelet Count, POC: 109 10*3/uL — AB (ref 142–424)
RBC: 3.9 M/uL — AB (ref 4.69–6.13)
RDW, POC: 12.6 %
WBC: 5.8 10*3/uL (ref 4.6–10.2)

## 2014-02-03 NOTE — Progress Notes (Signed)
Pt came in for lab  only 

## 2014-02-04 ENCOUNTER — Encounter: Payer: Self-pay | Admitting: Internal Medicine

## 2014-02-04 ENCOUNTER — Ambulatory Visit (INDEPENDENT_AMBULATORY_CARE_PROVIDER_SITE_OTHER): Payer: Medicare Other | Admitting: Internal Medicine

## 2014-02-04 VITALS — BP 122/60 | HR 60 | Ht 68.75 in | Wt 190.2 lb

## 2014-02-04 DIAGNOSIS — K439 Ventral hernia without obstruction or gangrene: Secondary | ICD-10-CM | POA: Diagnosis not present

## 2014-02-04 DIAGNOSIS — R10815 Periumbilic abdominal tenderness: Secondary | ICD-10-CM

## 2014-02-04 DIAGNOSIS — Z860100 Personal history of colon polyps, unspecified: Secondary | ICD-10-CM

## 2014-02-04 NOTE — Progress Notes (Signed)
   Subjective:    [name], male    DOB: Nov 25, 1940, 73 y.o.   MRN: [id number]  HPI Patient is here with his wife, he is somewhat overdue for a five-year followup colonoscopy after having had a tubulovillous adenoma resected in 2007. Subsequent colonoscopy 2010 and was without polyps.  He has had a tender area in his abdomen and mental bites primary care provider he might have an ulcer. He takes Nexium every day. He does not have symptoms and last someone presses on his abdomen. He has noticed a bulge in the upper abdomen. Medications, allergies, past medical history, past surgical history, family history and social history are reviewed and updated in the EMR.   Review of Systems Doing well otherwise, recent multiple tick bites. He has been covered with doxycycline has not developed any fever symptoms. He does get rashes with these.    Objective:   Physical Exam General:  Well-developed, well-nourished and in no acute distress Eyes:  anicteric. Lungs: Clear to auscultation bilaterally. Heart:  S1S2, no rubs, murmurs, gallops. Abdomen:  soft, with an upper midline small scar which is slightly tender around the umbilicus - and there is a small ventral hernia above his incision scar, no hepatosplenomegaly, hernia, or mass and BS+.  Extremities:   no edema Skin  Healing tick bites Neuro:  A&O x 3.  Psych:  appropriate mood and  Affect.   Data Reviewed:  As above, also reviewed primary care notes in 2015    Assessment & Plan:   1. Ventral hernia without obstruction or gangrene   2. Abdominal tenderness, periumbilic   3. Personal history of colonic polyps    1. Schedule surveillance colonoscopy given history of tubulovillous adenoma. 2. His hernia is very small and may just be a diastasis recti perhaps. Will monitor. 3. Tender areas are along his suture line in my opinion a thick eschar tissue and non-ulcer and does not merit further workup at this time

## 2014-02-04 NOTE — Patient Instructions (Signed)
You have been scheduled for a colonoscopy. Please follow written instructions given to you at your visit today.  Please pick up your prep supplies at the pharmacy. If you use inhalers (even only as needed), please bring them with you on the day of your procedure. Your physician has requested that you go to www.[email or web address].[email or web address] and enter the access code given to you at your visit today. This web site gives a general overview about your procedure. However, you should still follow specific instructions given to you by our office regarding your preparation for the procedure.   I appreciate the opportunity to care for you.  

## 2014-02-06 ENCOUNTER — Other Ambulatory Visit: Payer: Self-pay | Admitting: Family Medicine

## 2014-02-07 LAB — LYME AB/WESTERN BLOT REFLEX
LYME DISEASE AB, QUANT, IGM: <0.8 index (ref 0.00–0.79)
Lyme IgG/IgM Ab: <0.91 {ISR} (ref 0.00–0.90)

## 2014-02-07 LAB — HEPATIC FUNCTION PANEL
ALT: 22 IU/L (ref 0–44)
AST: 20 IU/L (ref 0–40)
Albumin: 4.5 g/dL (ref 3.5–4.8)
Alkaline Phosphatase: 54 IU/L (ref 39–117)
Bilirubin, Direct: 0.19 mg/dL (ref 0.00–0.40)
Total Bilirubin: 0.8 mg/dL (ref 0.0–1.2)
Total Protein: 6.2 g/dL (ref 6.0–8.5)

## 2014-02-07 LAB — NMR, LIPOPROFILE
Cholesterol: 126 mg/dL (ref 100–199)
HDL CHOLESTEROL BY NMR: 37 mg/dL — AB (ref >39)
HDL Particle Number: 28.7 umol/L — ABNORMAL LOW (ref >=30.5)
LDL Particle Number: 731 nmol/L (ref <1000)
LDL Size: 20.1 nm (ref >20.5)
LDLC SERPL CALC-MCNC: 59 mg/dL (ref 0–99)
LP-IR Score: 71 — ABNORMAL HIGH (ref <=45)
Small LDL Particle Number: 410 nmol/L (ref <=527)
Triglycerides by NMR: 148 mg/dL (ref 0–149)

## 2014-02-07 LAB — ROCKY MTN SPOTTED FVR ABS PNL(IGG+IGM)
RMSF IGM: 0.44 {index} (ref 0.00–0.89)
RMSF IgG: POSITIVE — AB

## 2014-02-07 LAB — BMP8+EGFR
BUN / CREAT RATIO: 13 (ref 10–22)
BUN: 16 mg/dL (ref 8–27)
CHLORIDE: 100 mmol/L (ref 97–108)
CO2: 23 mmol/L (ref 18–29)
Calcium: 9.3 mg/dL (ref 8.6–10.2)
Creatinine, Ser: 1.24 mg/dL (ref 0.76–1.27)
GFR calc Af Amer: 67 mL/min/{1.73_m2} (ref >59)
GFR calc non Af Amer: 58 mL/min/{1.73_m2} — ABNORMAL LOW (ref >59)
Glucose: 126 mg/dL — ABNORMAL HIGH (ref 65–99)
POTASSIUM: 4.4 mmol/L (ref 3.5–5.2)
SODIUM: 139 mmol/L (ref 134–144)

## 2014-02-07 LAB — VITAMIN D 25 HYDROXY (VIT D DEFICIENCY, FRACTURES): Vit D, 25-Hydroxy: 38.9 ng/mL (ref 30.0–100.0)

## 2014-02-07 LAB — RMSF, IGG, IFA: RMSF, IGG, IFA: 1:64 {titer} — ABNORMAL HIGH

## 2014-02-08 ENCOUNTER — Encounter: Payer: Self-pay | Admitting: Family Medicine

## 2014-02-08 ENCOUNTER — Ambulatory Visit (INDEPENDENT_AMBULATORY_CARE_PROVIDER_SITE_OTHER): Payer: Medicare Other

## 2014-02-08 ENCOUNTER — Other Ambulatory Visit: Payer: Self-pay | Admitting: Family Medicine

## 2014-02-08 ENCOUNTER — Ambulatory Visit (INDEPENDENT_AMBULATORY_CARE_PROVIDER_SITE_OTHER): Payer: Medicare Other | Admitting: Family Medicine

## 2014-02-08 VITALS — BP 136/66 | HR 51 | Temp 97.5°F | Ht 68.75 in | Wt 193.0 lb

## 2014-02-08 DIAGNOSIS — I10 Essential (primary) hypertension: Secondary | ICD-10-CM

## 2014-02-08 DIAGNOSIS — A779 Spotted fever, unspecified: Secondary | ICD-10-CM

## 2014-02-08 DIAGNOSIS — T148 Other injury of unspecified body region: Secondary | ICD-10-CM | POA: Diagnosis not present

## 2014-02-08 DIAGNOSIS — E119 Type 2 diabetes mellitus without complications: Secondary | ICD-10-CM | POA: Diagnosis not present

## 2014-02-08 DIAGNOSIS — N400 Enlarged prostate without lower urinary tract symptoms: Secondary | ICD-10-CM

## 2014-02-08 DIAGNOSIS — A770 Spotted fever due to Rickettsia rickettsii: Secondary | ICD-10-CM

## 2014-02-08 DIAGNOSIS — W57XXXA Bitten or stung by nonvenomous insect and other nonvenomous arthropods, initial encounter: Secondary | ICD-10-CM

## 2014-02-08 LAB — POCT GLYCOSYLATED HEMOGLOBIN (HGB A1C): Hemoglobin A1C: 5.7

## 2014-02-08 MED ORDER — DOXYCYCLINE HYCLATE 100 MG PO TABS: 100.0000 mg | ORAL_TABLET | Freq: Two times a day (BID) | ORAL | Status: DC

## 2014-02-08 NOTE — Progress Notes (Signed)
Subjective:    [name], male    DOB: 05/03/1941, 73 y.o.   MRN: [id number]  HPI Pt here for follow up and management of chronic medical problems. The patient's recent lab work will be reviewed with him during the visit today. He doesn't bring in blood sugars and blood pressures from home for review and overall both of these appeared to be indicating his blood sugar and blood pressure are under good control. He did have a recent positive [place] spotted fever titer at 164. This is the IgG titer. This is indicative of a past or possible current infection and he was treated with 2 weeks worth of doxycycline. A recent hemoglobin A1c was not available but will be added to his recent lab work. This number was 5.7.         Patient Active Problem List   Diagnosis Date Noted  . Ventral hernia without obstruction or gangrene 02/04/2014  . Personal history of colonic polyps 02/04/2014  . Hyperlipemia 12/30/2012  . Hypertension 12/30/2012  . BPH (benign prostatic hypertrophy) 12/30/2012  . Diabetes type 2, controlled 09/28/2012  . RBBB 04/11/2010  . CARDIOVASCULAR FUNCTION STUDY, ABNORMAL 04/11/2010  . HIATAL HERNIA, with GERD 09/14/2007  . NEPHROLITHIASIS 09/14/2007   Outpatient Encounter Prescriptions as of 02/08/2014  Medication Sig  . aspirin 81 MG tablet Take 81 mg by mouth daily.    . cholecalciferol (VITAMIN D) 1000 UNITS tablet Take 2,000 Units by mouth daily.   . Cinnamon 500 MG TABS Take 1,000 tablets by mouth 2 (two) times daily.  Marland Kitchen glucose blood (ONE TOUCH ULTRA TEST) test strip Check blood sugar qid and prn  . Melatonin 5 MG TABS Take 1 tablet by mouth daily.    . metFORMIN (GLUCOPHAGE) 500 MG tablet TAKE ONE TABLET BY MOUTH TWICE DAILY AFTER MEALS  . NEXIUM 40 MG capsule TAKE ONE CAPSULE BY MOUTH ONE TIME DAILY  . ONETOUCH DELICA LANCETS 32D MISC   . simvastatin (ZOCOR) 40 MG tablet Take 1 tablet (40 mg total) by mouth every evening.  .  valsartan-hydrochlorothiazide (DIOVAN-HCT) 320-25 MG per tablet TAKE ONE TABLET BY MOUTH ONE TIME DAILY  . [DISCONTINUED] doxycycline (VIBRA-TABS) 100 MG tablet Take 1 tablet (100 mg total) by mouth 2 (two) times daily.    Review of Systems  Constitutional: Negative.   HENT: Negative.   Eyes: Negative.   Respiratory: Negative.   Cardiovascular: Negative.   Gastrointestinal: Negative.   Endocrine: Negative.   Genitourinary: Negative.   Musculoskeletal: Positive for arthralgias (left arm pain at times- no chest pains at these times).  Skin: Negative.   Allergic/Immunologic: Negative.   Neurological: Negative.   Hematological: Negative.   Psychiatric/Behavioral: Negative.        Objective:   Physical Exam  Nursing note and vitals reviewed. Constitutional: He is oriented to person, place, and time. He appears well-developed and well-nourished. No distress.  HENT:  Head: Normocephalic and atraumatic.  Right Ear: External ear normal.  Left Ear: External ear normal.  Nose: Nose normal.  Mouth/Throat: Oropharynx is clear and moist. No oropharyngeal exudate.  Eyes: Conjunctivae and EOM are normal. Pupils are equal, round, and reactive to light. Right eye exhibits no discharge. Left eye exhibits no discharge. No scleral icterus.  Neck: Normal range of motion. Neck supple. No thyromegaly present.  No carotid bruits  Cardiovascular: Normal rate, regular rhythm, normal heart sounds and intact distal pulses.  Exam reveals no gallop and no friction rub.  No murmur heard. At 60 per minute  Pulmonary/Chest: Effort normal and breath sounds normal. No respiratory distress. He has no wheezes. He has no rales. He exhibits no tenderness.  Abdominal: Soft. Bowel sounds are normal. He exhibits no mass. There is no tenderness. There is no rebound and no guarding.  No inguinal adenopathy No abdominal bruits  Musculoskeletal: Normal range of motion. He exhibits no edema and no tenderness.    Lymphadenopathy:    He has no cervical adenopathy.  Neurological: He is alert and oriented to person, place, and time. He has normal reflexes. No cranial nerve deficit.  Skin: Skin is warm and dry. No rash noted. No erythema. No pallor.  There is a small circular bruise at the right lateral thigh area  Psychiatric: He has a normal mood and affect. His behavior is normal. Judgment and thought content normal.   BP 136/66  Pulse 51  Temp(Src) 97.5 F (36.4 C) (Oral)  Ht 5' 8.75" (1.746 m)  Wt 193 lb (87.544 kg)  BMI 28.72 kg/m2  [facility] reading (PRIMARY) by  Dr.Yaritzy Huser-chest x-ray--  no active disease other than degenerative changes in the spine                                A. hemoglobin A1c that was added to the recent blood work was 5.7%.--The patient was made aware of this during the visit today       Assessment & Plan:  1. Diabetes type 2, controlled  2. BPH (benign prostatic hypertrophy)  3. Essential hypertension - DG Chest 2 View; Future  4. Tick bite - doxycycline (VIBRA-TABS) 100 MG tablet; Take 1 tablet (100 mg total) by mouth 2 (two) times daily.  Dispense: 20 tablet; Refill: 3  5. [place] spotted fever   Meds ordered this encounter  Medications  . doxycycline (VIBRA-TABS) 100 MG tablet    Sig: Take 1 tablet (100 mg total) by mouth 2 (two) times daily.    Dispense:  20 tablet    Refill:  3   Arrie Senate MD

## 2014-02-08 NOTE — Patient Instructions (Addendum)
Continue current medications. Continue good therapeutic lifestyle changes which include good diet and exercise. Fall precautions discussed with patient. If an FOBT was given today- please return it to our front desk. If you are over 73 years old - you may need Prevnar 35 or the adult Pneumonia vaccine.  Flu Shots will be available at our office starting mid- September. Please call and schedule a FLU CLINIC APPOINTMENT.   Keep yourself checked closely for ticks If the left arm pain continues please get back in touch with [place], especially if you note that it happens with exercise

## 2014-02-08 NOTE — Addendum Note (Signed)
Addended by: Pollyann Kennedy F on: 02/08/2014 11:49 AM   Modules accepted: Orders

## 2014-02-15 ENCOUNTER — Ambulatory Visit: Payer: Medicare Other | Admitting: *Deleted

## 2014-02-15 VITALS — BP 128/70

## 2014-02-15 DIAGNOSIS — I10 Essential (primary) hypertension: Secondary | ICD-10-CM

## 2014-02-15 NOTE — Progress Notes (Signed)
[name], male   DOB: May 18, 1941, 73 y.o.   MRN: [id number] BP check was normal

## 2014-02-28 ENCOUNTER — Telehealth: Payer: Self-pay | Admitting: Family Medicine

## 2014-02-28 NOTE — Telephone Encounter (Signed)
Joe [name] is having continued congestion after taking Zpac Wanted appt to see [facility] appt scheduled

## 2014-03-26 ENCOUNTER — Ambulatory Visit (INDEPENDENT_AMBULATORY_CARE_PROVIDER_SITE_OTHER): Payer: Medicare Other

## 2014-03-26 DIAGNOSIS — Z23 Encounter for immunization: Secondary | ICD-10-CM | POA: Diagnosis not present

## 2014-04-04 ENCOUNTER — Encounter: Payer: Self-pay | Admitting: Cardiology

## 2014-04-06 ENCOUNTER — Ambulatory Visit (AMBULATORY_SURGERY_CENTER): Payer: Medicare Other | Admitting: Internal Medicine

## 2014-04-06 ENCOUNTER — Encounter: Payer: Self-pay | Admitting: Internal Medicine

## 2014-04-06 VITALS — BP 127/66 | HR 55 | Temp 97.0°F | Resp 21 | Ht 68.75 in | Wt 190.0 lb

## 2014-04-06 DIAGNOSIS — E119 Type 2 diabetes mellitus without complications: Secondary | ICD-10-CM | POA: Diagnosis not present

## 2014-04-06 DIAGNOSIS — Z86010 Personal history of colonic polyps: Secondary | ICD-10-CM | POA: Diagnosis not present

## 2014-04-06 DIAGNOSIS — E669 Obesity, unspecified: Secondary | ICD-10-CM | POA: Diagnosis not present

## 2014-04-06 DIAGNOSIS — I10 Essential (primary) hypertension: Secondary | ICD-10-CM | POA: Diagnosis not present

## 2014-04-06 DIAGNOSIS — I4510 Unspecified right bundle-branch block: Secondary | ICD-10-CM | POA: Diagnosis not present

## 2014-04-06 DIAGNOSIS — I2510 Atherosclerotic heart disease of native coronary artery without angina pectoris: Secondary | ICD-10-CM | POA: Diagnosis not present

## 2014-04-06 LAB — GLUCOSE, CAPILLARY
GLUCOSE-CAPILLARY: 121 mg/dL — AB (ref 70–99)
GLUCOSE-CAPILLARY: 118 mg/dL — AB (ref 70–99)

## 2014-04-06 MED ORDER — SODIUM CHLORIDE 0.9 % IV SOLN: 500.0000 mL | INTRAVENOUS | Status: DC

## 2014-04-06 NOTE — Progress Notes (Signed)
Report to PACU, RN, vss, BBS= Clear.  

## 2014-04-06 NOTE — Patient Instructions (Addendum)
No polyps (again) !  We can consider another exam in 5 years.  I appreciate the opportunity to care for you. Gatha Mayer, MD, [facility]   Discharge instructions given. Normal exam. Resume previous medications. YOU HAD AN ENDOSCOPIC PROCEDURE TODAY AT [facility]: Refer to the procedure report that was given to you for any specific questions about what was found during the examination.  If the procedure report does not answer your questions, please call your gastroenterologist to clarify.  If you requested that your care partner not be given the details of your procedure findings, then the procedure report has been included in a sealed envelope for you to review at your convenience later.  YOU SHOULD EXPECT: Some feelings of bloating in the abdomen. Passage of more gas than usual.  Walking can help get rid of the air that was put into your GI tract during the procedure and reduce the bloating. If you had a lower endoscopy (such as a colonoscopy or flexible sigmoidoscopy) you may notice spotting of blood in your stool or on the toilet paper. If you underwent a bowel prep for your procedure, then you may not have a normal bowel movement for a few days.  DIET: Your first meal following the procedure should be a light meal and then it is ok to progress to your normal diet.  A half-sandwich or bowl of soup is an example of a good first meal.  Heavy or fried foods are harder to digest and may make you feel nauseous or bloated.  Likewise meals heavy in dairy and vegetables can cause extra gas to form and this can also increase the bloating.  Drink plenty of fluids but you should avoid alcoholic beverages for 24 hours.  ACTIVITY: Your care partner should take you home directly after the procedure.  You should plan to take it easy, moving slowly for the rest of the day.  You can resume normal activity the day after the procedure however you should NOT DRIVE or use heavy machinery for  24 hours (because of the sedation medicines used during the test).    SYMPTOMS TO REPORT IMMEDIATELY: A gastroenterologist can be reached at any hour.  During normal business hours, 8:30 AM to 5:00 PM Monday through Friday, call [phone].  After hours and on weekends, please call the GI answering service at [phone] who will take a message and have the physician on call contact you.   Following lower endoscopy (colonoscopy or flexible sigmoidoscopy):  Excessive amounts of blood in the stool  Significant tenderness or worsening of abdominal pains  Swelling of the abdomen that is new, acute  Fever of 100F or higher  FOLLOW UP: If any biopsies were taken you will be contacted by phone or by letter within the next 1-3 weeks.  Call your gastroenterologist if you have not heard about the biopsies in 3 weeks.  Our staff will call the home number listed on your records the next business day following your procedure to check on you and address any questions or concerns that you may have at that time regarding the information given to you following your procedure. This is a courtesy call and so if there is no answer at the home number and we have not heard from you through the emergency physician on call, we will assume that you have returned to your regular daily activities without incident.  SIGNATURES/CONFIDENTIALITY: You and/or your care partner have signed paperwork which will  be entered into your electronic medical record.  These signatures attest to the fact that that the information above on your After Visit Summary has been reviewed and is understood.  Full responsibility of the confidentiality of this discharge information lies with you and/or your care-partner. 

## 2014-04-06 NOTE — Op Note (Signed)
[place]  [address] [place], [address]   COLONOSCOPY PROCEDURE REPORT  PATIENT: Joe [name], Joe [name]  MR#: [id number] BIRTHDATE: 08-12-1940 , 82  yrs. old GENDER: male ENDOSCOPIST: Gatha Mayer, MD, [facility] PROCEDURE DATE:  04/06/2014 PROCEDURE:   Colonoscopy, surveillance First Screening Colonoscopy - Avg.  risk and is 50 yrs.  old or older - No.  Prior Negative Screening - Now for repeat screening. N/A  History of Adenoma - Now for follow-up colonoscopy & has been > or = to 3 yrs.  Yes hx of adenoma.  Has been 3 or more years since last colonoscopy.  Polyps Removed Today? No.  Polyps Removed Today? No.  Recommend repeat exam, <10 yrs? Polyps Removed Today? No.  Recommend repeat exam, <10 yrs? Yes.  Polyps Removed Today? No.  Recommend repeat exam, <10 yrs? Yes.  High risk (family or personal hx). ASA CLASS:   Class III INDICATIONS:surveillance colonoscopy based on a history of adenomatous colonic polyp(s). MEDICATIONS: Propofol 160 mg IV and Monitored anesthesia care  DESCRIPTION OF PROCEDURE:   After the risks benefits and alternatives of the procedure were thoroughly explained, informed consent was obtained.  The digital rectal exam revealed no abnormalities of the rectum and revealed no prostatic nodules. The [id number]  endoscope was introduced through the anus and advanced to the surgical anastomosis. No adverse events experienced.   The quality of the prep was excellent, using MiraLax The instrument was then slowly withdrawn as the colon was fully examined.      COLON FINDINGS: There was evidence of a prior ileocolonic surgical anastomosis in the right colon.   The examination was otherwise normal.  Retroflexed views revealed no abnormalities. The time to cecum=1 minutes 02 seconds.  Withdrawal time=7 minutes 31 seconds. The scope was withdrawn and the procedure completed. COMPLICATIONS: There were no immediate complications.  ENDOSCOPIC  IMPRESSION: 1.   There was evidence of a prior ileocolonic surgical anastomosis in the right colon 2.   The examination was otherwise normal - excellent prep - hx large TV adenoma resection 2007, no polyps since  RECOMMENDATIONS: Consider repeat Colonoscopy in 5 years.  eSigned:  Gatha Mayer, MD, [facility] 04/06/2014 1:50 PM   cc: The Patient

## 2014-04-07 ENCOUNTER — Telehealth: Payer: Self-pay | Admitting: *Deleted

## 2014-04-07 NOTE — Telephone Encounter (Signed)
  Follow up Call-  Call back number 04/06/2014  Post procedure Call Back phone  # [phone]  Permission to leave phone message Yes     Patient questions:  Do you have a fever, pain , or abdominal swelling? No. Pain Score  0 *  Have you tolerated food without any problems? Yes.    Have you been able to return to your normal activities? Yes.    Do you have any questions about your discharge instructions: Diet   No. Medications  No. Follow up visit  No.  Do you have questions or concerns about your Care? No.  Actions: * If pain score is 4 or above: No action needed, pain <4.

## 2014-04-27 ENCOUNTER — Other Ambulatory Visit: Payer: Self-pay | Admitting: Family Medicine

## 2014-05-07 ENCOUNTER — Other Ambulatory Visit: Payer: Self-pay | Admitting: Family Medicine

## 2014-05-26 ENCOUNTER — Ambulatory Visit (INDEPENDENT_AMBULATORY_CARE_PROVIDER_SITE_OTHER): Payer: Medicare Other | Admitting: Nurse Practitioner

## 2014-05-26 ENCOUNTER — Encounter: Payer: Self-pay | Admitting: Nurse Practitioner

## 2014-05-26 VITALS — BP 129/71 | HR 58 | Temp 98.5°F | Ht 68.75 in | Wt 192.4 lb

## 2014-05-26 DIAGNOSIS — R509 Fever, unspecified: Secondary | ICD-10-CM

## 2014-05-26 LAB — POCT INFLUENZA A/B
Influenza A, POC: NEGATIVE
Influenza B, POC: NEGATIVE

## 2014-05-26 LAB — POCT RAPID STREP A (OFFICE): RAPID STREP A SCREEN: NEGATIVE

## 2014-05-26 NOTE — Progress Notes (Signed)
   Subjective:    [name], male    DOB: November 17, 1940, 73 y.o.   MRN: [id number]  HPI Patient in today with hs wife saying that he has had fever over 100 last night, achy and headache- all started last night.    Review of Systems  Constitutional: Positive for fever and chills.  HENT: Negative for congestion.   Respiratory: Negative for cough.   Gastrointestinal: Negative.   Genitourinary: Negative.   Neurological: Positive for headaches.  Psychiatric/Behavioral: Negative.   All other systems reviewed and are negative.      Objective:   Physical Exam  Constitutional: He is oriented to person, place, and time. He appears well-developed and well-nourished.  HENT:  Right Ear: Hearing, tympanic membrane, external ear and ear canal normal.  Left Ear: Hearing, tympanic membrane, external ear and ear canal normal.  Nose: Mucosal edema and rhinorrhea present. Right sinus exhibits no maxillary sinus tenderness and no frontal sinus tenderness. Left sinus exhibits no maxillary sinus tenderness and no frontal sinus tenderness.  Mouth/Throat: Uvula is midline, oropharynx is clear and moist and mucous membranes are normal.  Eyes: Pupils are equal, round, and reactive to light.  Neck: Normal range of motion. Neck supple.  Cardiovascular: Normal rate, regular rhythm and normal heart sounds.   Pulmonary/Chest: Effort normal and breath sounds normal. He has no wheezes. He has no rales.  Abdominal: Soft. Bowel sounds are normal.  Lymphadenopathy:    He has no cervical adenopathy.  Neurological: He is alert and oriented to person, place, and time.  Skin: Skin is warm and dry.  Psychiatric: He has a normal mood and affect. His behavior is normal. Judgment and thought content normal.   BP 129/71 mmHg  Pulse 58  Temp(Src) 98.5 F (36.9 C) (Oral)  Ht 5' 8.75" (1.746 m)  Wt 192 lb 6.4 oz (87.272 kg)  BMI 28.63 kg/m2  Results for orders placed or performed in visit on 05/26/14    POCT Influenza A/B  Result Value Ref Range   Influenza A, POC Negative    Influenza B, POC Negative   POCT rapid strep A  Result Value Ref Range   Rapid Strep A Screen Negative Negative          Assessment & Plan:   1. Fever, unspecified fever cause    1. Take meds as prescribed 2. Use a cool mist humidifier especially during the winter months and when heat has been humid. 3. Use saline nose sprays frequently 4. Saline irrigations of the nose can be very helpful if done frequently.  * 4X daily for 1 week*  * Use of a nettie pot can be helpful with this. Follow directions with this* 5. Drink plenty of fluids 6. Keep thermostat turn down low 7.For any cough or congestion  Use plain Mucinex- regular strength or max strength is fine   * Children- consult with Pharmacist for dosing 8. For fever or aces or pains- take tylenol or ibuprofen appropriate for age and weight.  * for fevers greater than 101 orally you may alternate ibuprofen and tylenol every  3 hours.   Mary-Margaret Hassell Done, FNP

## 2014-05-26 NOTE — Patient Instructions (Signed)
Upper Respiratory Infection, Adult An upper respiratory infection (URI) is also sometimes known as the common cold. The upper respiratory tract includes the nose, sinuses, throat, trachea, and bronchi. Bronchi are the airways leading to the lungs. Most people improve within 1 week, but symptoms can last up to 2 weeks. A residual cough may last even longer.  CAUSES Many different viruses can infect the tissues lining the upper respiratory tract. The tissues become irritated and inflamed and often become very moist. Mucus production is also common. A cold is contagious. You can easily spread the virus to others by oral contact. This includes kissing, sharing a glass, coughing, or sneezing. Touching your mouth or nose and then touching a surface, which is then touched by another person, can also spread the virus. SYMPTOMS  Symptoms typically develop 1 to 3 days after you come in contact with a cold virus. Symptoms vary from person to person. They may include:  Runny nose.  Sneezing.  Nasal congestion.  Sinus irritation.  Sore throat.  Loss of voice (laryngitis).  Cough.  Fatigue.  Muscle aches.  Loss of appetite.  Headache.  Low-grade fever. DIAGNOSIS  You might diagnose your own cold based on familiar symptoms, since most people get a cold 2 to 3 times a year. Your caregiver can confirm this based on your exam. Most importantly, your caregiver can check that your symptoms are not due to another disease such as strep throat, sinusitis, pneumonia, asthma, or epiglottitis. Blood tests, throat tests, and X-rays are not necessary to diagnose a common cold, but they may sometimes be helpful in excluding other more serious diseases. Your caregiver will decide if any further tests are required. RISKS AND COMPLICATIONS  You may be at risk for a more severe case of the common cold if you smoke cigarettes, have chronic heart disease (such as heart failure) or lung disease (such as asthma), or if  you have a weakened immune system. The very young and very old are also at risk for more serious infections. Bacterial sinusitis, middle ear infections, and bacterial pneumonia can complicate the common cold. The common cold can worsen asthma and chronic obstructive pulmonary disease (COPD). Sometimes, these complications can require emergency medical care and may be life-threatening. PREVENTION  The best way to protect against getting a cold is to practice good hygiene. Avoid oral or hand contact with people with cold symptoms. Wash your hands often if contact occurs. There is no clear evidence that vitamin C, vitamin E, echinacea, or exercise reduces the chance of developing a cold. However, it is always recommended to get plenty of rest and practice good nutrition. TREATMENT  Treatment is directed at relieving symptoms. There is no cure. Antibiotics are not effective, because the infection is caused by a virus, not by bacteria. Treatment may include:  Increased fluid intake. Sports drinks offer valuable electrolytes, sugars, and fluids.  Breathing heated mist or steam (vaporizer or shower).  Eating chicken soup or other clear broths, and maintaining good nutrition.  Getting plenty of rest.  Using gargles or lozenges for comfort.  Controlling fevers with ibuprofen or acetaminophen as directed by your caregiver.  Increasing usage of your inhaler if you have asthma. Zinc gel and zinc lozenges, taken in the first 24 hours of the common cold, can shorten the duration and lessen the severity of symptoms. Pain medicines may help with fever, muscle aches, and throat pain. A variety of non-prescription medicines are available to treat congestion and runny nose. Your caregiver   can make recommendations and may suggest nasal or lung inhalers for other symptoms.  HOME CARE INSTRUCTIONS   Only take over-the-counter or prescription medicines for pain, discomfort, or fever as directed by your  caregiver.  Use a warm mist humidifier or inhale steam from a shower to increase air moisture. This may keep secretions moist and make it easier to breathe.  Drink enough water and fluids to keep your urine clear or pale yellow.  Rest as needed.  Return to work when your temperature has returned to normal or as your caregiver advises. You may need to stay home longer to avoid infecting others. You can also use a face mask and careful hand washing to prevent spread of the virus. SEEK MEDICAL CARE IF:   After the first few days, you feel you are getting worse rather than better.  You need your caregiver's advice about medicines to control symptoms.  You develop chills, worsening shortness of breath, or brown or red sputum. These may be signs of pneumonia.  You develop yellow or brown nasal discharge or pain in the face, especially when you bend forward. These may be signs of sinusitis.  You develop a fever, swollen neck glands, pain with swallowing, or white areas in the back of your throat. These may be signs of strep throat. SEEK IMMEDIATE MEDICAL CARE IF:   You have a fever.  You develop severe or persistent headache, ear pain, sinus pain, or chest pain.  You develop wheezing, a prolonged cough, cough up blood, or have a change in your usual mucus (if you have chronic lung disease).  You develop sore muscles or a stiff neck. Document Released: 11/13/2000 Document Revised: 08/12/2011 Document Reviewed: 08/25/2013 ExitCare Patient Information 2015 [employer]. This information is not intended to replace advice given to you by your health care provider. Make sure you discuss any questions you have with your health care provider.  

## 2014-05-30 ENCOUNTER — Other Ambulatory Visit: Payer: Self-pay | Admitting: Family Medicine

## 2014-05-31 ENCOUNTER — Ambulatory Visit: Payer: Medicare Other | Admitting: Family Medicine

## 2014-05-31 NOTE — Telephone Encounter (Signed)
Patient states that he is feeling better today and that he was running a fever last night and wanted to be seen by Laurance Flatten. Appointment given for today at 2pm with Laurance Flatten ok per Roselyn Reef.

## 2014-06-07 ENCOUNTER — Encounter: Payer: Self-pay | Admitting: Family Medicine

## 2014-06-07 ENCOUNTER — Telehealth: Payer: Self-pay | Admitting: Family Medicine

## 2014-06-07 ENCOUNTER — Ambulatory Visit (INDEPENDENT_AMBULATORY_CARE_PROVIDER_SITE_OTHER): Payer: Medicare Other | Admitting: Family Medicine

## 2014-06-07 VITALS — BP 110/60 | HR 53 | Temp 97.0°F | Ht 68.75 in | Wt 189.0 lb

## 2014-06-07 DIAGNOSIS — M542 Cervicalgia: Secondary | ICD-10-CM

## 2014-06-07 NOTE — Progress Notes (Signed)
Subjective:    [name], male    DOB: 05-20-1941, 74 y.o.   MRN: [id number]  HPI Patient here today for right side neck pain. He is currently on a round of Septra for cold symptoms.        Patient Active Problem List   Diagnosis Date Noted  . Ventral hernia without obstruction or gangrene 02/04/2014  . Personal history of colonic polyps 02/04/2014  . Hyperlipemia 12/30/2012  . Hypertension 12/30/2012  . BPH (benign prostatic hypertrophy) 12/30/2012  . Diabetes type 2, controlled 09/28/2012  . RBBB 04/11/2010  . CARDIOVASCULAR FUNCTION STUDY, ABNORMAL 04/11/2010  . HIATAL HERNIA, with GERD 09/14/2007  . NEPHROLITHIASIS 09/14/2007   Outpatient Encounter Prescriptions as of 06/07/2014  Medication Sig  . aspirin 81 MG tablet Take 81 mg by mouth daily.    . cholecalciferol (VITAMIN D) 1000 UNITS tablet Take 2,000 Units by mouth daily.   . Cinnamon 500 MG TABS Take 1,000 tablets by mouth 2 (two) times daily.  Marland Kitchen glucose blood (ONE TOUCH ULTRA TEST) test strip Check BS qid  Dx: E11.9  . Melatonin 5 MG TABS Take 1 tablet by mouth daily.    . metFORMIN (GLUCOPHAGE) 500 MG tablet TAKE ONE TABLET BY MOUTH TWICE DAILY AFTER MEALS  . NEXIUM 40 MG capsule TAKE ONE CAPSULE BY MOUTH ONE TIME DAILY  . ONETOUCH DELICA LANCETS 27N MISC   . simvastatin (ZOCOR) 40 MG tablet TAKE ONE TABLET BY MOUTH IN THE EVENING  . sulfamethoxazole-trimethoprim (BACTRIM DS,SEPTRA DS) 800-160 MG per tablet Take 1 tablet by mouth 2 (two) times daily.  . valsartan-hydrochlorothiazide (DIOVAN-HCT) 320-25 MG per tablet TAKE ONE TABLET BY MOUTH ONE TIME DAILY    Review of Systems  Constitutional: Negative.   HENT: Negative.   Eyes: Negative.   Respiratory: Negative.   Cardiovascular: Negative.   Gastrointestinal: Negative.   Endocrine: Negative.   Genitourinary: Negative.   Musculoskeletal: Positive for neck pain (right side neck tenderness).  Skin: Negative.   Allergic/Immunologic:  Negative.   Neurological: Negative.   Hematological: Negative.   Psychiatric/Behavioral: Negative.        Objective:   Physical Exam  Constitutional: He is oriented to person, place, and time. He appears well-developed and well-nourished. No distress.  HENT:  Head: Normocephalic and atraumatic.  Right Ear: External ear normal.  Left Ear: External ear normal.  Nose: Nose normal.  Mouth/Throat: Oropharynx is clear and moist. No oropharyngeal exudate.  Eyes: Conjunctivae and EOM are normal. Pupils are equal, round, and reactive to light. Right eye exhibits no discharge. Left eye exhibits no discharge. No scleral icterus.  Neck: Normal range of motion. Neck supple. No tracheal deviation present. No thyromegaly present.  The neck was tender on the right anterior cervical area but there was no adenopathy or no masses palpable. There were no thyroid abnormalities palpable. The area in question which is tender to palpation. There was no asymmetry apparent by comparing one side to the other. The patient has just noticed this for the past 12 hours.  Cardiovascular: Normal rate and regular rhythm.  Exam reveals no friction rub.   No murmur heard. Pulmonary/Chest: Effort normal and breath sounds normal. No respiratory distress. He has no wheezes. He has no rales. He exhibits no tenderness.  Abdominal: He exhibits no mass.  Musculoskeletal: Normal range of motion. He exhibits no edema.  Lymphadenopathy:    He has no cervical adenopathy.  Neurological: He is alert and oriented to person, place,  and time.  Skin: Skin is warm and dry. No rash noted.  Psychiatric: He has a normal mood and affect. His behavior is normal. Judgment and thought content normal.  Nursing note and vitals reviewed.  BP 110/60 mmHg  Pulse 53  Temp(Src) 97 F (36.1 C) (Oral)  Ht 5' 8.75" (1.746 m)  Wt 189 lb (85.73 kg)  BMI 28.12 kg/m2        Assessment & Plan:   1. Neck pain on right side  Patient Instructions    If problem persist - call us and let us know  We may get an [place]   Warm wet compressions to area Cont antibiotic    Arrie Senate MD

## 2014-06-07 NOTE — Patient Instructions (Signed)
If problem persist - call us and let us know  We may get an [place]   Warm wet compressions to area Cont antibiotic

## 2014-06-07 NOTE — Telephone Encounter (Signed)
appt given  

## 2014-06-15 ENCOUNTER — Other Ambulatory Visit (INDEPENDENT_AMBULATORY_CARE_PROVIDER_SITE_OTHER): Payer: Medicare Other

## 2014-06-15 DIAGNOSIS — E559 Vitamin D deficiency, unspecified: Secondary | ICD-10-CM | POA: Diagnosis not present

## 2014-06-15 DIAGNOSIS — I10 Essential (primary) hypertension: Secondary | ICD-10-CM | POA: Diagnosis not present

## 2014-06-15 DIAGNOSIS — E785 Hyperlipidemia, unspecified: Secondary | ICD-10-CM

## 2014-06-15 DIAGNOSIS — N400 Enlarged prostate without lower urinary tract symptoms: Secondary | ICD-10-CM | POA: Diagnosis not present

## 2014-06-15 DIAGNOSIS — E119 Type 2 diabetes mellitus without complications: Secondary | ICD-10-CM | POA: Diagnosis not present

## 2014-06-15 LAB — POCT CBC
GRANULOCYTE PERCENT: 70.1 % (ref 37–80)
HCT, POC: 35.3 % — AB (ref 43.5–53.7)
Hemoglobin: 11.4 g/dL — AB (ref 14.1–18.1)
LYMPH, POC: 1 (ref 0.6–3.4)
MCH, POC: 30.7 pg (ref 27–31.2)
MCHC: 32.4 g/dL (ref 31.8–35.4)
MCV: 95 fL (ref 80–97)
MPV: 7.8 fL (ref 0–99.8)
PLATELET COUNT, POC: 187 10*3/uL (ref 142–424)
POC GRANULOCYTE: 3.4 (ref 2–6.9)
POC LYMPH %: 20.9 % (ref 10–50)
RBC: 3.7 M/uL — AB (ref 4.69–6.13)
RDW, POC: 12.9 %
WBC: 4.9 10*3/uL (ref 4.6–10.2)

## 2014-06-15 LAB — POCT GLYCOSYLATED HEMOGLOBIN (HGB A1C): HEMOGLOBIN A1C: 6.7

## 2014-06-15 NOTE — Progress Notes (Signed)
Lab only 

## 2014-06-16 LAB — HEPATIC FUNCTION PANEL
ALT: 28 IU/L (ref 0–44)
AST: 22 IU/L (ref 0–40)
Albumin: 4.1 g/dL (ref 3.5–4.8)
Alkaline Phosphatase: 62 IU/L (ref 39–117)
Bilirubin, Direct: 0.2 mg/dL (ref 0.00–0.40)
Total Bilirubin: 0.8 mg/dL (ref 0.0–1.2)
Total Protein: 6.3 g/dL (ref 6.0–8.5)

## 2014-06-16 LAB — BMP8+EGFR
BUN/Creatinine Ratio: 14 (ref 10–22)
BUN: 17 mg/dL (ref 8–27)
CO2: 24 mmol/L (ref 18–29)
Calcium: 8.9 mg/dL (ref 8.6–10.2)
Chloride: 100 mmol/L (ref 97–108)
Creatinine, Ser: 1.21 mg/dL (ref 0.76–1.27)
GFR calc Af Amer: 68 mL/min/{1.73_m2} (ref >59)
GFR calc non Af Amer: 59 mL/min/{1.73_m2} — ABNORMAL LOW (ref >59)
GLUCOSE: 135 mg/dL — AB (ref 65–99)
Potassium: 4.9 mmol/L (ref 3.5–5.2)
SODIUM: 137 mmol/L (ref 134–144)

## 2014-06-16 LAB — NMR, LIPOPROFILE
Cholesterol: 107 mg/dL (ref 100–199)
HDL Cholesterol by NMR: 36 mg/dL — ABNORMAL LOW (ref >39)
HDL PARTICLE NUMBER: 25.1 umol/L — AB (ref >=30.5)
LDL Particle Number: 445 nmol/L (ref <1000)
LDL Size: 20.6 nm (ref >20.5)
LDL-C: 42 mg/dL (ref 0–99)
LP-IR SCORE: 64 — AB (ref <=45)
SMALL LDL PARTICLE NUMBER: 151 nmol/L (ref <=527)
Triglycerides by NMR: 143 mg/dL (ref 0–149)

## 2014-06-16 LAB — PSA, TOTAL AND FREE
PSA FREE PCT: 19.3 %
PSA, Free: 0.58 ng/mL
PSA: 3 ng/mL (ref 0.0–4.0)

## 2014-06-16 LAB — VITAMIN D 25 HYDROXY (VIT D DEFICIENCY, FRACTURES): VIT D 25 HYDROXY: 39.6 ng/mL (ref 30.0–100.0)

## 2014-06-20 ENCOUNTER — Ambulatory Visit (INDEPENDENT_AMBULATORY_CARE_PROVIDER_SITE_OTHER): Payer: Medicare Other | Admitting: Family Medicine

## 2014-06-20 ENCOUNTER — Encounter: Payer: Self-pay | Admitting: Family Medicine

## 2014-06-20 ENCOUNTER — Ambulatory Visit (INDEPENDENT_AMBULATORY_CARE_PROVIDER_SITE_OTHER): Payer: Medicare Other

## 2014-06-20 VITALS — BP 131/67 | HR 51 | Temp 97.2°F | Ht 68.75 in | Wt 189.0 lb

## 2014-06-20 DIAGNOSIS — I10 Essential (primary) hypertension: Secondary | ICD-10-CM | POA: Diagnosis not present

## 2014-06-20 DIAGNOSIS — E119 Type 2 diabetes mellitus without complications: Secondary | ICD-10-CM

## 2014-06-20 DIAGNOSIS — M545 Low back pain: Secondary | ICD-10-CM

## 2014-06-20 DIAGNOSIS — M5124 Other intervertebral disc displacement, thoracic region: Secondary | ICD-10-CM | POA: Diagnosis not present

## 2014-06-20 DIAGNOSIS — E785 Hyperlipidemia, unspecified: Secondary | ICD-10-CM

## 2014-06-20 DIAGNOSIS — N400 Enlarged prostate without lower urinary tract symptoms: Secondary | ICD-10-CM | POA: Diagnosis not present

## 2014-06-20 DIAGNOSIS — I700 Atherosclerosis of aorta: Secondary | ICD-10-CM | POA: Insufficient documentation

## 2014-06-20 DIAGNOSIS — I861 Scrotal varices: Secondary | ICD-10-CM

## 2014-06-20 DIAGNOSIS — L219 Seborrheic dermatitis, unspecified: Secondary | ICD-10-CM

## 2014-06-20 DIAGNOSIS — Z0000 Encounter for general adult medical examination without abnormal findings: Secondary | ICD-10-CM

## 2014-06-20 LAB — POCT URINALYSIS DIPSTICK
Bilirubin, UA: NEGATIVE
Blood, UA: NEGATIVE
Glucose, UA: NEGATIVE
Ketones, UA: NEGATIVE
Leukocytes, UA: NEGATIVE
NITRITE UA: NEGATIVE
PH UA: 5
Protein, UA: NEGATIVE
Spec Grav, UA: <=1.005
UROBILINOGEN UA: NEGATIVE

## 2014-06-20 LAB — POCT UA - MICROSCOPIC ONLY
Casts, Ur, LPF, POC: NEGATIVE
Crystals, Ur, HPF, POC: NEGATIVE
MUCUS UA: NEGATIVE
RBC, urine, microscopic: NEGATIVE
WBC, UR, HPF, POC: NEGATIVE
Yeast, UA: NEGATIVE

## 2014-06-20 LAB — POCT UA - MICROALBUMIN: MICROALBUMIN (UR) POC: NEGATIVE mg/L

## 2014-06-20 NOTE — Progress Notes (Signed)
Subjective:    [name], male    DOB: [birth year], 74 y.o.   MRN: [id number]  HPI Pt here for follow up and management of chronic medical problems which include diabetes, hypertension and hyperlipidemia. He is taking all medications regularly. The patient comes to the visit today with his wife. He is due to have a prostate exam today and he will be given an FOBT card to take home. We will go over his recent lab work with him and he will get a urinalysis today. He does continue to have low back pain which he says is getting worse.          Patient Active Problem List   Diagnosis Date Noted  . Ventral hernia without obstruction or gangrene 02/04/2014  . Personal history of colonic polyps 02/04/2014  . Hyperlipemia 12/30/2012  . Hypertension 12/30/2012  . BPH (benign prostatic hypertrophy) 12/30/2012  . Diabetes type 2, controlled 09/28/2012  . RBBB 04/11/2010  . CARDIOVASCULAR FUNCTION STUDY, ABNORMAL 04/11/2010  . HIATAL HERNIA, with GERD 09/14/2007  . NEPHROLITHIASIS 09/14/2007   Outpatient Encounter Prescriptions as of 06/20/2014  Medication Sig  . aspirin 81 MG tablet Take 81 mg by mouth daily.    . cholecalciferol (VITAMIN D) 1000 UNITS tablet Take 2,000 Units by mouth daily.   . Cinnamon 500 MG TABS Take 1,000 tablets by mouth 2 (two) times daily.  Marland Kitchen glucose blood (ONE TOUCH ULTRA TEST) test strip Check BS qid  Dx: E11.9  . Melatonin 5 MG TABS Take 1 tablet by mouth daily.    . metFORMIN (GLUCOPHAGE) 500 MG tablet TAKE ONE TABLET BY MOUTH TWICE DAILY AFTER MEALS  . NEXIUM 40 MG capsule TAKE ONE CAPSULE BY MOUTH ONE TIME DAILY  . ONETOUCH DELICA LANCETS 35D MISC   . simvastatin (ZOCOR) 40 MG tablet TAKE ONE TABLET BY MOUTH IN THE EVENING  . valsartan-hydrochlorothiazide (DIOVAN-HCT) 320-25 MG per tablet TAKE ONE TABLET BY MOUTH ONE TIME DAILY  . [DISCONTINUED] sulfamethoxazole-trimethoprim (BACTRIM DS,SEPTRA DS) 800-160 MG per tablet Take 1 tablet by mouth  2 (two) times daily.    Review of Systems  Constitutional: Negative.   HENT: Negative.   Eyes: Negative.   Respiratory: Negative.   Cardiovascular: Negative.   Gastrointestinal: Negative.   Endocrine: Negative.   Genitourinary: Negative.   Musculoskeletal: Positive for arthralgias (low back pain and right side pain).  Skin: Negative.   Allergic/Immunologic: Negative.   Neurological: Negative.   Hematological: Negative.   Psychiatric/Behavioral: Negative.        Objective:   Physical Exam  Constitutional: He is oriented to person, place, and time. He appears well-developed and well-nourished. No distress.  HENT:  Head: Normocephalic and atraumatic.  Left Ear: External ear normal.  Nose: Nose normal.  Mouth/Throat: Oropharynx is clear and moist. No oropharyngeal exudate.  Right ear canal is impacted with cerumen. Minimal nasal congestion bilaterally  Eyes: Conjunctivae and EOM are normal. Pupils are equal, round, and reactive to light. Right eye exhibits no discharge. Left eye exhibits no discharge. No scleral icterus.  Neck: Normal range of motion. Neck supple. No tracheal deviation present. No thyromegaly present.  No carotid bruits or thyromegaly or anterior cervical adenopathy  Cardiovascular: Normal rate, regular rhythm, normal heart sounds and intact distal pulses.  Exam reveals no gallop and no friction rub.   No murmur heard. At 60/m with a regular rate and rhythm and without murmurs  Pulmonary/Chest: Effort normal and breath sounds normal. No respiratory  distress. He has no wheezes. He has no rales. He exhibits no tenderness.  No axillary adenopathy with good breath sounds anteriorly and posteriorly  Abdominal: Soft. Bowel sounds are normal. He exhibits no mass. There is tenderness. There is no rebound and no guarding.  No abdominal bruits Slight epigastric tenderness  Genitourinary: Rectum normal and penis normal.  The prostate is enlarged and smooth without  irregularities. There are no rectal masses. The left testicle has a varicocele otherwise the testicular exam bilaterally was negative. There were no inguinal hernias or inguinal adenopathy.  Musculoskeletal: Normal range of motion. He exhibits no edema or tenderness.  Lymphadenopathy:    He has no cervical adenopathy.  Neurological: He is alert and oriented to person, place, and time. He has normal reflexes. No cranial nerve deficit.  Skin: Skin is warm and dry. Rash noted. No erythema. No pallor.  There is seborrheic dermatitis of the cheeks of the face.  Psychiatric: He has a normal mood and affect. His behavior is normal. Judgment and thought content normal.  Nursing note and vitals reviewed.  BP 131/67 mmHg  Pulse 51  Temp(Src) 97.2 F (36.2 C) (Oral)  Ht 5' 8.75" (1.746 m)  Wt 189 lb (85.73 kg)  BMI 28.12 kg/m2   [facility] reading (PRIMARY) by  Dr. Ramar Nobrega-degenerative changes, scoliosis and spurring                                       Assessment & Plan:  1. Diabetes type 2, controlled -Continue metformin and try to do better with exercise and diet - POCT UA - Microalbumin  2. Essential hypertension -Continue Diovan HCT  3. BPH (benign prostatic hypertrophy) - POCT UA - Microscopic Only - POCT urinalysis dipstick  4. Hyperlipemia -Begin exercise and diet regimen and continue with simvastatin  5. Annual physical exam  6. Low back pain, unspecified back pain laterality, with sciatica presence unspecified -If problems continue with back, consider MRI and visit to orthopedic surgeon - DG Lumbar Spine 2-3 Views; Future  7. Thoracic disc herniation -Consider MRI and referral to orthopedic surgeon  8. Seborrheic dermatitis -Make changes with soap detergents and fabric softeners  9. Left varicocele -We will continue to monitor this.  Patient Instructions                       Medicare Annual Wellness Visit  [facility] and the medical providers at [facility] strive to bring you the best medical care.  In doing so we not only want to address your current medical conditions and concerns but also to detect new conditions early and prevent illness, disease and health-related problems.    Medicare offers a yearly Wellness Visit which allows our clinical staff to assess your need for preventative services including immunizations, lifestyle education, counseling to decrease risk of preventable diseases and screening for fall risk and other medical concerns.    This visit is provided free of charge (no copay) for all Medicare recipients. The clinical pharmacists at [facility] have begun to conduct these Wellness Visits which will also include a thorough review of all your medications.    As you primary medical provider recommend that you make an appointment for your Annual Wellness Visit if you have not done so already this year.  You may set up this appointment before you leave today or  you may call back ([phone]) and schedule an appointment.  Please make sure when you call that you mention that you are scheduling your Annual Wellness Visit with the clinical pharmacist so that the appointment may be made for the proper length of time.     Continue current medications. Continue good therapeutic lifestyle changes which include good diet and exercise. Fall precautions discussed with patient. If an FOBT was given today- please return it to our front desk. If you are over 89 years old - you may need Prevnar 46 or the adult Pneumonia vaccine.  Flu Shots will be available at our office starting mid- September. Please call and schedule a FLU CLINIC APPOINTMENT.   The patient should try to do better with exercise and diet He should continue to monitor his blood sugars and blood pressures regularly at home He should drink more fluids Use Debrox earwax softener for 3-4 nights in a row, weight 1 week and repeat this for 3 or 4  nights in a row and then make an appointment to have someone  do an ear wash for you. If the problems with the back continue, consider seeing the orthopedist and getting injections in the back after repeat MRI   Arrie Senate MD

## 2014-06-20 NOTE — Patient Instructions (Addendum)
Medicare Annual Wellness Visit  [facility] and the medical providers at [facility] strive to bring you the best medical care.  In doing so we not only want to address your current medical conditions and concerns but also to detect new conditions early and prevent illness, disease and health-related problems.    Medicare offers a yearly Wellness Visit which allows our clinical staff to assess your need for preventative services including immunizations, lifestyle education, counseling to decrease risk of preventable diseases and screening for fall risk and other medical concerns.    This visit is provided free of charge (no copay) for all Medicare recipients. The clinical pharmacists at [facility] have begun to conduct these Wellness Visits which will also include a thorough review of all your medications.    As you primary medical provider recommend that you make an appointment for your Annual Wellness Visit if you have not done so already this year.  You may set up this appointment before you leave today or you may call back ([phone]) and schedule an appointment.  Please make sure when you call that you mention that you are scheduling your Annual Wellness Visit with the clinical pharmacist so that the appointment may be made for the proper length of time.     Continue current medications. Continue good therapeutic lifestyle changes which include good diet and exercise. Fall precautions discussed with patient. If an FOBT was given today- please return it to our front desk. If you are over 9 years old - you may need Prevnar 38 or the adult Pneumonia vaccine.  Flu Shots will be available at our office starting mid- September. Please call and schedule a FLU CLINIC APPOINTMENT.   The patient should try to do better with exercise and diet He should continue to monitor his blood sugars and blood pressures regularly at home He  should drink more fluids Use Debrox earwax softener for 3-4 nights in a row, weight 1 week and repeat this for 3 or 4 nights in a row and then make an appointment to have someone  do an ear wash for you. If the problems with the back continue, consider seeing the orthopedist and getting injections in the back after repeat MRI

## 2014-07-28 ENCOUNTER — Telehealth: Payer: Self-pay | Admitting: Family Medicine

## 2014-07-28 NOTE — Telephone Encounter (Signed)
Ok, will do

## 2014-07-29 ENCOUNTER — Other Ambulatory Visit: Payer: Self-pay | Admitting: Family Medicine

## 2014-08-05 DIAGNOSIS — E119 Type 2 diabetes mellitus without complications: Secondary | ICD-10-CM | POA: Diagnosis not present

## 2014-08-05 DIAGNOSIS — H40033 Anatomical narrow angle, bilateral: Secondary | ICD-10-CM | POA: Diagnosis not present

## 2014-08-05 LAB — HM DIABETES EYE EXAM

## 2014-08-13 ENCOUNTER — Other Ambulatory Visit: Payer: Self-pay | Admitting: Family Medicine

## 2014-08-24 ENCOUNTER — Telehealth: Payer: Self-pay | Admitting: Family Medicine

## 2014-08-24 DIAGNOSIS — W57XXXA Bitten or stung by nonvenomous insect and other nonvenomous arthropods, initial encounter: Secondary | ICD-10-CM

## 2014-08-24 NOTE — Telephone Encounter (Signed)
How long with the ticks on him and when was the last 2 removed? What kind of ticks were they? Does he have any rash or symptoms? He could get Lyme and spotted fever titers.

## 2014-08-24 NOTE — Telephone Encounter (Signed)
Stp's wife, advised DW[facility] out of the office will be back tomorrow, she said ok, and to try mobile tomorrow if she doesn't answer home number. Please advise regarding antbx for ticks?

## 2014-08-25 ENCOUNTER — Other Ambulatory Visit (INDEPENDENT_AMBULATORY_CARE_PROVIDER_SITE_OTHER): Payer: Medicare Other

## 2014-08-25 DIAGNOSIS — W57XXXA Bitten or stung by nonvenomous insect and other nonvenomous arthropods, initial encounter: Secondary | ICD-10-CM

## 2014-08-25 DIAGNOSIS — T148 Other injury of unspecified body region: Secondary | ICD-10-CM

## 2014-08-25 NOTE — Progress Notes (Signed)
Lab only 

## 2014-08-29 LAB — ROCKY MTN SPOTTED FVR ABS PNL(IGG+IGM)
RMSF IgG: POSITIVE — AB
RMSF IgM: 0.44 index (ref 0.00–0.89)

## 2014-08-29 LAB — LYME AB/WESTERN BLOT REFLEX: Lyme IgG/IgM Ab: <0.91 {ISR} (ref 0.00–0.90)

## 2014-08-29 LAB — RMSF, IGG, IFA: RMSF, IGG, IFA: 1:64 {titer} — ABNORMAL HIGH

## 2014-08-30 ENCOUNTER — Telehealth: Payer: Self-pay | Admitting: Family Medicine

## 2014-08-30 NOTE — Telephone Encounter (Signed)
Patient aware of results.

## 2014-09-01 ENCOUNTER — Other Ambulatory Visit: Payer: Self-pay | Admitting: Family Medicine

## 2014-09-09 ENCOUNTER — Encounter: Payer: Self-pay | Admitting: *Deleted

## 2014-09-12 ENCOUNTER — Telehealth: Payer: Self-pay | Admitting: Pharmacist Clinician (PhC)/ Clinical Pharmacy Specialist

## 2014-09-14 ENCOUNTER — Other Ambulatory Visit (INDEPENDENT_AMBULATORY_CARE_PROVIDER_SITE_OTHER): Payer: Medicare Other

## 2014-09-14 DIAGNOSIS — E119 Type 2 diabetes mellitus without complications: Secondary | ICD-10-CM

## 2014-09-14 DIAGNOSIS — I10 Essential (primary) hypertension: Secondary | ICD-10-CM | POA: Diagnosis not present

## 2014-09-14 DIAGNOSIS — E559 Vitamin D deficiency, unspecified: Secondary | ICD-10-CM | POA: Diagnosis not present

## 2014-09-14 DIAGNOSIS — N400 Enlarged prostate without lower urinary tract symptoms: Secondary | ICD-10-CM | POA: Diagnosis not present

## 2014-09-14 DIAGNOSIS — E785 Hyperlipidemia, unspecified: Secondary | ICD-10-CM

## 2014-09-14 LAB — POCT CBC
GRANULOCYTE PERCENT: 74 % (ref 37–80)
HCT, POC: 37 % — AB (ref 43.5–53.7)
Hemoglobin: 12.1 g/dL — AB (ref 14.1–18.1)
LYMPH, POC: 1.1 (ref 0.6–3.4)
MCH, POC: 31.6 pg — AB (ref 27–31.2)
MCHC: 32.8 g/dL (ref 31.8–35.4)
MCV: 96.2 fL (ref 80–97)
MPV: 9.6 fL (ref 0–99.8)
PLATELET COUNT, POC: 140 10*3/uL — AB (ref 142–424)
POC GRANULOCYTE: 3.6 (ref 2–6.9)
POC LYMPH PERCENT: 23 %L (ref 10–50)
RBC: 3.84 M/uL — AB (ref 4.69–6.13)
RDW, POC: 12.5 %
WBC: 4.9 10*3/uL (ref 4.6–10.2)

## 2014-09-14 LAB — POCT GLYCOSYLATED HEMOGLOBIN (HGB A1C): HEMOGLOBIN A1C: 7.3

## 2014-09-15 LAB — NMR, LIPOPROFILE
Cholesterol: 107 mg/dL (ref 100–199)
HDL CHOLESTEROL BY NMR: 46 mg/dL (ref >39)
HDL Particle Number: 33.2 umol/L (ref >=30.5)
LDL Particle Number: 603 nmol/L (ref <1000)
LDL Size: 20.5 nm (ref >20.5)
LDL-C: 46 mg/dL (ref 0–99)
LP-IR SCORE: 47 — AB (ref <=45)
SMALL LDL PARTICLE NUMBER: 258 nmol/L (ref <=527)
Triglycerides by NMR: 75 mg/dL (ref 0–149)

## 2014-09-15 LAB — HEPATIC FUNCTION PANEL
ALBUMIN: 4.2 g/dL (ref 3.5–4.8)
ALK PHOS: 54 IU/L (ref 39–117)
ALT: 17 IU/L (ref 0–44)
AST: 20 IU/L (ref 0–40)
BILIRUBIN, DIRECT: 0.2 mg/dL (ref 0.00–0.40)
Bilirubin Total: 0.6 mg/dL (ref 0.0–1.2)
Total Protein: 6.3 g/dL (ref 6.0–8.5)

## 2014-09-15 LAB — BMP8+EGFR
BUN/Creatinine Ratio: 17 (ref 10–22)
BUN: 19 mg/dL (ref 8–27)
CALCIUM: 9.1 mg/dL (ref 8.6–10.2)
CO2: 23 mmol/L (ref 18–29)
CREATININE: 1.14 mg/dL (ref 0.76–1.27)
Chloride: 101 mmol/L (ref 97–108)
GFR calc Af Amer: 73 mL/min/{1.73_m2} (ref >59)
GFR, EST NON AFRICAN AMERICAN: 63 mL/min/{1.73_m2} (ref >59)
GLUCOSE: 163 mg/dL — AB (ref 65–99)
Potassium: 4.6 mmol/L (ref 3.5–5.2)
Sodium: 140 mmol/L (ref 134–144)

## 2014-09-15 LAB — VITAMIN D 25 HYDROXY (VIT D DEFICIENCY, FRACTURES): VIT D 25 HYDROXY: 41.8 ng/mL (ref 30.0–100.0)

## 2014-09-16 NOTE — Progress Notes (Signed)
Patient aware and has appt with michelle to discuss sugar.

## 2014-09-20 ENCOUNTER — Encounter: Payer: Self-pay | Admitting: Pharmacist Clinician (PhC)/ Clinical Pharmacy Specialist

## 2014-09-20 MED ORDER — METFORMIN HCL 1000 MG PO TABS: 1000.0000 mg | ORAL_TABLET | Freq: Two times a day (BID) | ORAL | Status: DC

## 2014-09-22 DIAGNOSIS — H903 Sensorineural hearing loss, bilateral: Secondary | ICD-10-CM | POA: Diagnosis not present

## 2014-09-23 DIAGNOSIS — E119 Type 2 diabetes mellitus without complications: Secondary | ICD-10-CM | POA: Diagnosis not present

## 2014-10-04 ENCOUNTER — Telehealth: Payer: Self-pay | Admitting: Pharmacist Clinician (PhC)/ Clinical Pharmacy Specialist

## 2014-10-04 NOTE — Telephone Encounter (Signed)
This medication was sent to [facility] on 4/16 for metformin 1000mg  bid #180.  [facility]

## 2014-10-05 ENCOUNTER — Other Ambulatory Visit: Payer: Self-pay | Admitting: *Deleted

## 2014-10-05 MED ORDER — METFORMIN HCL 1000 MG PO TABS: 1000.0000 mg | ORAL_TABLET | Freq: Two times a day (BID) | ORAL | Status: DC

## 2014-10-28 ENCOUNTER — Encounter: Payer: Self-pay | Admitting: Family

## 2014-10-28 ENCOUNTER — Other Ambulatory Visit: Payer: Medicare Other

## 2014-10-28 ENCOUNTER — Ambulatory Visit (INDEPENDENT_AMBULATORY_CARE_PROVIDER_SITE_OTHER): Payer: Medicare Other | Admitting: Family

## 2014-10-28 ENCOUNTER — Ambulatory Visit: Payer: Medicare Other | Admitting: Family

## 2014-10-28 ENCOUNTER — Telehealth: Payer: Self-pay | Admitting: Family Medicine

## 2014-10-28 VITALS — BP 131/68 | HR 61 | Temp 97.5°F | Ht 68.75 in | Wt 182.0 lb

## 2014-10-28 DIAGNOSIS — W5501XA Bitten by cat, initial encounter: Secondary | ICD-10-CM

## 2014-10-28 DIAGNOSIS — S61401A Unspecified open wound of right hand, initial encounter: Secondary | ICD-10-CM | POA: Diagnosis not present

## 2014-10-28 MED ORDER — AMOXICILLIN-POT CLAVULANATE 875-125 MG PO TABS: 1.0000 | ORAL_TABLET | Freq: Two times a day (BID) | ORAL | Status: DC

## 2014-10-28 NOTE — Progress Notes (Signed)
   Subjective:    [name], male    DOB: [birth year], 74 y.o.   MRN: [id number]  Pt was giving his cat an antibiotic this AM and it bite his little finger on his right hand. Pt states the cat stays in doors at all times and is up to date on all of its shots. Pt states his TDAP is up to date.  Pt states he cleaned the area with betadione, applied antibioic ointment and kept it covered. Pt states it is an achy pain of a 3 out 10.  Animal Bite  The incident occurred today. There is an injury to the right little finger. The pain is mild. It is unlikely that a foreign body is present. Pertinent negatives include no numbness, no bladder incontinence, no hearing loss and no difficulty breathing.      Review of Systems  Constitutional: Negative.   HENT: Negative.  Negative for hearing loss.   Respiratory: Negative.   Cardiovascular: Negative.   Gastrointestinal: Negative.   Endocrine: Negative.   Genitourinary: Negative.  Negative for bladder incontinence.  Musculoskeletal: Negative.   Neurological: Negative.  Negative for numbness.  Hematological: Negative.   Psychiatric/Behavioral: Negative.   All other systems reviewed and are negative.      Objective:   Physical Exam  Constitutional: He is oriented to person, place, and time. He appears well-developed and well-nourished. No distress.  HENT:  Head: Normocephalic.  Right Ear: External ear normal.  Left Ear: External ear normal.  Mouth/Throat: Oropharynx is clear and moist.  Eyes: Pupils are equal, round, and reactive to light. Right eye exhibits no discharge. Left eye exhibits no discharge.  Neck: Normal range of motion. Neck supple. No thyromegaly present.  Cardiovascular: Normal rate, regular rhythm, normal heart sounds and intact distal pulses.   No murmur heard. Pulmonary/Chest: Effort normal and breath sounds normal. No respiratory distress. He has no wheezes.  Abdominal: Soft. Bowel sounds are normal. He  exhibits no distension. There is no tenderness.  Musculoskeletal: Normal range of motion. He exhibits edema and tenderness.  Neurological: He is alert and oriented to person, place, and time. He has normal reflexes. No cranial nerve deficit.  Skin: Skin is warm and dry. No rash noted. There is erythema.  Right little finger puncture wound with mild swelling and erythemas  Psychiatric: He has a normal mood and affect. His behavior is normal. Judgment and thought content normal.  Vitals reviewed.   BP 131/68 mmHg  Pulse 61  Temp(Src) 97.5 F (36.4 C) (Oral)  Ht 5' 8.75" (1.746 m)  Wt 182 lb (82.555 kg)  BMI 27.08 kg/m2       Assessment & Plan:  1. Cat bite, initial encounter -Keep area clean and dry -S/S of infection discussed -Pt to return to office if any redness increases, or if he has any drainage, fever, or warmth - amoxicillin-clavulanate (AUGMENTIN) 875-125 MG per tablet; Take 1 tablet by mouth 2 (two) times daily.  Dispense: 14 tablet; Refill: 0  Evelina Dun, FNP

## 2014-10-28 NOTE — Patient Instructions (Signed)

## 2014-10-28 NOTE — Telephone Encounter (Signed)
Appointment given for tonight with [employer], fnp

## 2014-10-29 ENCOUNTER — Ambulatory Visit: Payer: Medicare Other

## 2014-11-01 ENCOUNTER — Ambulatory Visit (INDEPENDENT_AMBULATORY_CARE_PROVIDER_SITE_OTHER): Payer: Medicare Other | Admitting: Family Medicine

## 2014-11-01 ENCOUNTER — Encounter: Payer: Self-pay | Admitting: Family Medicine

## 2014-11-01 VITALS — BP 153/71 | HR 55 | Temp 97.3°F | Ht 68.75 in | Wt 185.0 lb

## 2014-11-01 DIAGNOSIS — E785 Hyperlipidemia, unspecified: Secondary | ICD-10-CM | POA: Diagnosis not present

## 2014-11-01 DIAGNOSIS — I10 Essential (primary) hypertension: Secondary | ICD-10-CM

## 2014-11-01 DIAGNOSIS — N400 Enlarged prostate without lower urinary tract symptoms: Secondary | ICD-10-CM

## 2014-11-01 DIAGNOSIS — E119 Type 2 diabetes mellitus without complications: Secondary | ICD-10-CM | POA: Diagnosis not present

## 2014-11-01 DIAGNOSIS — E559 Vitamin D deficiency, unspecified: Secondary | ICD-10-CM | POA: Diagnosis not present

## 2014-11-01 NOTE — Patient Instructions (Addendum)
Medicare Annual Wellness Visit  [facility] and the medical providers at [facility] strive to bring you the best medical care.  In doing so we not only want to address your current medical conditions and concerns but also to detect new conditions early and prevent illness, disease and health-related problems.    Medicare offers a yearly Wellness Visit which allows our clinical staff to assess your need for preventative services including immunizations, lifestyle education, counseling to decrease risk of preventable diseases and screening for fall risk and other medical concerns.    This visit is provided free of charge (no copay) for all Medicare recipients. The clinical pharmacists at [facility] have begun to conduct these Wellness Visits which will also include a thorough review of all your medications.    As you primary medical provider recommend that you make an appointment for your Annual Wellness Visit if you have not done so already this year.  You may set up this appointment before you leave today or you may call back ([phone]) and schedule an appointment.  Please make sure when you call that you mention that you are scheduling your Annual Wellness Visit with the clinical pharmacist so that the appointment may be made for the proper length of time.     Continue current medications. Continue good therapeutic lifestyle changes which include good diet and exercise. Fall precautions discussed with patient. If an FOBT was given today- please return it to our front desk. If you are over 49 years old - you may need Prevnar 87 or the adult Pneumonia vaccine.  Flu Shots are still available at our office. If you still haven't had one please call to set up a nurse visit to get one.   After your visit with [place] today you will receive a survey in the mail or online from [employer] regarding your care with [place]. Please take a moment to  fill this out. Your feedback is very important to [place] as you can help [place] better understand your patient needs as well as improve your experience and satisfaction. WE CARE ABOUT YOU!!!   The patient should continue with his regular exercise regimen He should continue with that thousand of metformin twice daily

## 2014-11-01 NOTE — Progress Notes (Signed)
Subjective:    [name], male    DOB: [birth year], 74 y.o.   MRN: [id number]  HPI Pt here for follow up and management of chronic medical problems which includes hypertension, hyperlipidemia, and diabetes. He is taking medications regularly. The patient has been doing well. He exercises regularly. He is extremely excited about his new hearing aids which allows him to control the hearing in his environment much better than in the past. He denies chest pain or shortness of breath or problems with his GI tract. He is also having no problems passing his water.      Patient Active Problem List   Diagnosis Date Noted  . Thoracic disc herniation 06/20/2014  . Left varicocele 06/20/2014  . Abdominal aortic atherosclerosis 06/20/2014  . Ventral hernia without obstruction or gangrene 02/04/2014  . Personal history of colonic polyps 02/04/2014  . Hyperlipemia 12/30/2012  . Hypertension 12/30/2012  . BPH (benign prostatic hypertrophy) 12/30/2012  . Diabetes type 2, controlled 09/28/2012  . RBBB 04/11/2010  . CARDIOVASCULAR FUNCTION STUDY, ABNORMAL 04/11/2010  . HIATAL HERNIA, with GERD 09/14/2007  . NEPHROLITHIASIS 09/14/2007   Outpatient Encounter Prescriptions as of 11/01/2014  Medication Sig  . amoxicillin-clavulanate (AUGMENTIN) 875-125 MG per tablet Take 1 tablet by mouth 2 (two) times daily.  Marland Kitchen aspirin 81 MG tablet Take 81 mg by mouth daily.    . cholecalciferol (VITAMIN D) 1000 UNITS tablet Take 2,000 Units by mouth daily.   . Cinnamon 500 MG TABS Take 1,000 tablets by mouth 2 (two) times daily.  Marland Kitchen glucose blood (ONE TOUCH ULTRA TEST) test strip Check BS qid  Dx: E11.9  . Melatonin 5 MG TABS Take 1 tablet by mouth daily.    . metFORMIN (GLUCOPHAGE) 1000 MG tablet Take 1 tablet (1,000 mg total) by mouth 2 (two) times daily with a meal.  . NEXIUM 40 MG capsule TAKE ONE CAPSULE BY MOUTH ONE TIME DAILY  . ONETOUCH DELICA LANCETS 13K MISC   . simvastatin (ZOCOR) 40 MG  tablet TAKE ONE TABLET BY MOUTH IN THE EVENING  . valsartan-hydrochlorothiazide (DIOVAN-HCT) 320-25 MG per tablet TAKE ONE TABLET BY MOUTH ONE  TIME DAILY   No facility-administered encounter medications on file as of 11/01/2014.      Review of Systems  Constitutional: Negative.   HENT: Negative.   Eyes: Negative.   Respiratory: Negative.   Cardiovascular: Negative.   Gastrointestinal: Negative.   Endocrine: Negative.   Genitourinary: Negative.   Musculoskeletal: Negative.   Skin: Negative.   Allergic/Immunologic: Negative.   Neurological: Negative.   Hematological: Negative.   Psychiatric/Behavioral: Negative.        Objective:   Physical Exam  Constitutional: He is oriented to person, place, and time. He appears well-developed and well-nourished. No distress.  HENT:  Head: Normocephalic and atraumatic.  Right Ear: External ear normal.  Left Ear: External ear normal.  Nose: Nose normal.  Mouth/Throat: Oropharynx is clear and moist. No oropharyngeal exudate.  Eyes: Conjunctivae and EOM are normal. Pupils are equal, round, and reactive to light. Right eye exhibits no discharge. Left eye exhibits no discharge. No scleral icterus.  Neck: Normal range of motion. Neck supple. No thyromegaly present.  There are no carotid bruits or thyromegaly.  Cardiovascular: Normal rate, regular rhythm, normal heart sounds and intact distal pulses.  Exam reveals no gallop and no friction rub.   No murmur heard. The heart was regular at 72/m  Pulmonary/Chest: Effort normal and breath sounds normal. No respiratory  distress. He has no wheezes. He has no rales. He exhibits no tenderness.  Clear anteriorly and posteriorly  Abdominal: Soft. Bowel sounds are normal. He exhibits no mass. There is no tenderness. There is no rebound and no guarding.  No abdominal tenderness and no bruits auscultated  Musculoskeletal: Normal range of motion. He exhibits no edema or tenderness.  Lymphadenopathy:    He  has no cervical adenopathy.  Neurological: He is alert and oriented to person, place, and time. He has normal reflexes. No cranial nerve deficit.  Skin: Skin is warm and dry. No rash noted. No erythema. No pallor.  Psychiatric: He has a normal mood and affect. His behavior is normal. Judgment and thought content normal.  Nursing note and vitals reviewed.  BP 153/71 mmHg  Pulse 55  Temp(Src) 97.3 F (36.3 C) (Oral)  Ht 5' 8.75" (1.746 m)  Wt 185 lb (83.915 kg)  BMI 27.53 kg/m2        Assessment & Plan:  1. Diabetes type 2, controlled -The blood sugar has been under better control recently since he increased his metformin to 1000 mg twice daily. He will continue to check his blood sugars regularly and hopefully future A1c will be down more than it was this time and below 7.0.  2. Essential hypertension -The blood pressure was elevated in the office today but his home blood pressure readings were good he will continue with current treatment  3. BPH (benign prostatic hypertrophy) -He is having no issues with his enlarged prostate or with voiding.  4. Hyperlipemia -Cholesterol numbers remain excellent on current treatment of simvastatin and no change in treatment will be made  5. Vitamin D deficiency -Vitamin D level is good he will continue with current treatment  Patient Instructions                       Medicare Annual Wellness Visit  [facility] and the medical providers at [facility] strive to bring you the best medical care.  In doing so we not only want to address your current medical conditions and concerns but also to detect new conditions early and prevent illness, disease and health-related problems.    Medicare offers a yearly Wellness Visit which allows our clinical staff to assess your need for preventative services including immunizations, lifestyle education, counseling to decrease risk of preventable diseases and screening for fall risk and  other medical concerns.    This visit is provided free of charge (no copay) for all Medicare recipients. The clinical pharmacists at [facility] have begun to conduct these Wellness Visits which will also include a thorough review of all your medications.    As you primary medical provider recommend that you make an appointment for your Annual Wellness Visit if you have not done so already this year.  You may set up this appointment before you leave today or you may call back ([phone]) and schedule an appointment.  Please make sure when you call that you mention that you are scheduling your Annual Wellness Visit with the clinical pharmacist so that the appointment may be made for the proper length of time.     Continue current medications. Continue good therapeutic lifestyle changes which include good diet and exercise. Fall precautions discussed with patient. If an FOBT was given today- please return it to our front desk. If you are over 20 years old - you may need Prevnar 66 or the adult Pneumonia  vaccine.  Flu Shots are still available at our office. If you still haven't had one please call to set up a nurse visit to get one.   After your visit with [place] today you will receive a survey in the mail or online from [employer] regarding your care with [place]. Please take a moment to fill this out. Your feedback is very important to [place] as you can help [place] better understand your patient needs as well as improve your experience and satisfaction. WE CARE ABOUT YOU!!!   The patient should continue with his regular exercise regimen He should continue with that thousand of metformin twice daily    Arrie Senate MD

## 2014-12-07 ENCOUNTER — Other Ambulatory Visit: Payer: Self-pay

## 2014-12-07 MED ORDER — ESOMEPRAZOLE MAGNESIUM 40 MG PO CPDR: 40.0000 mg | DELAYED_RELEASE_CAPSULE | Freq: Every day | ORAL | Status: DC

## 2014-12-27 ENCOUNTER — Other Ambulatory Visit: Payer: Self-pay | Admitting: Family Medicine

## 2015-01-03 ENCOUNTER — Telehealth: Payer: Self-pay

## 2015-01-03 NOTE — Telephone Encounter (Signed)
Insurance prior authorized Nexium 11/27/14 to 12/27/15

## 2015-01-04 NOTE — Telephone Encounter (Signed)
x

## 2015-01-16 ENCOUNTER — Ambulatory Visit (INDEPENDENT_AMBULATORY_CARE_PROVIDER_SITE_OTHER): Payer: Medicare Other | Admitting: Family Medicine

## 2015-01-16 ENCOUNTER — Encounter: Payer: Self-pay | Admitting: Family Medicine

## 2015-01-16 VITALS — BP 111/63 | HR 61 | Temp 97.1°F | Ht 68.75 in | Wt 182.4 lb

## 2015-01-16 DIAGNOSIS — E119 Type 2 diabetes mellitus without complications: Secondary | ICD-10-CM

## 2015-01-16 DIAGNOSIS — L989 Disorder of the skin and subcutaneous tissue, unspecified: Secondary | ICD-10-CM | POA: Insufficient documentation

## 2015-01-16 LAB — POCT GLYCOSYLATED HEMOGLOBIN (HGB A1C): Hemoglobin A1C: 6.6

## 2015-01-16 MED ORDER — MUPIROCIN 2 % EX OINT: 1.0000 "application " | TOPICAL_OINTMENT | Freq: Two times a day (BID) | CUTANEOUS | Status: DC

## 2015-01-16 NOTE — Assessment & Plan Note (Addendum)
Macerated lesion between third and fourth toes of the left foot, no signs of infection Discussed epsom soaks, mupirocin, small piece of gauze to help keep it dry and hopefully gently debride the wound Return in one week, discussed and cautioned about signs of infection No signs of active infection currently, mupirocin is merely there to decrease the chance Very well controlled DM2  Smaller lesion between first and second toes on the right, this one has heme crusting and no laceration. I believe it's healing well and will heal well without any interventions.

## 2015-01-16 NOTE — Patient Instructions (Signed)
Great to meet you!  Try the foot soaks once daily, followed by mupirocin Place the gauze between the toes and remove at night  Come back in about 1 week to recheck.   If you develop rednes, drainage, pain, or fever then please come back sooner.

## 2015-01-16 NOTE — Progress Notes (Signed)
[name], male   DOB: 1940-06-19, 74 y.o.   MRN: [id number]   HPI  Patient presents today for same-day appointment to examine his foot lesion  Patient explains that last night he found 2 lesions on his feet. On his right foot he has a small lesion that's slightly staff that he feels is probably due to a scratch. He denies any pain, drainage, fever, redness, or concern about this wound.  On his left foot between his third and fourth toes he has a moist appearing lesion he feels that was likely due to a blister that has now popped. He's been putting Triple Antibiotic ointment on it at least twice a day and sometimes up to 3 or 4 times daily. He denies any erythema, warmth, drainage, or pain associated with this lesion as well. He denies malaise, fever, change in appetite, or feeling unwell.  His diabetes is under good control, his A1c is 6.6 today  PMH: Smoking status noted ROS: Per HPI, otherwise negative   Objective: BP 111/63 mmHg  Pulse 61  Temp(Src) 97.1 F (36.2 C) (Oral)  Ht 5' 8.75" (1.746 m)  Wt 182 lb 6.4 oz (82.736 kg)  BMI 27.14 kg/m2 Gen: NAD, alert, cooperative with exam HEENT: NCAT CV: RRR, good S1/S2, no murmur Resp: CTABL, no wheezes, non-labored Ext: No edema, warm Neuro: Alert and oriented, No gross deficits, normal gait Skin:  Left foot: Approximately 5-8 mm in diameter circular lesion between her third and fourth toe, there is a macerated moist-looking rim, there is no surrounding erythema, induration, warmth, or tenderness to palpation. With a wooden Q-tip I probed the base which feels like flash, no bone palpated. Right foot: 2 small heme crusted areas, each maybe 1-2 mm in diameter between first and second toe, no erythema, drainage, warmth, or tenderness to palpation.  Assessment and plan:  Foot lesion Macerated lesion between third and fourth toes of the left foot, no signs of infection Discussed epsom soaks, mupirocin, small piece of  gauze to help keep it dry and hopefully gently debride the wound Return in one week, discussed and cautioned about signs of infection No signs of active infection currently, mupirocin is merely there to decrease the chance Very well controlled DM2  Smaller lesion between first and second toes on the right, this one has heme crusting and no laceration. I believe it's healing well and will heal well without any interventions.    Orders Placed This Encounter  Procedures  . POCT glycosylated hemoglobin (Hb A1C)    Meds ordered this encounter  Medications  . mupirocin ointment (BACTROBAN) 2 %    Sig: Place 1 application into the nose 2 (two) times daily.    Dispense:  22 g    Refill:  Arispe, MD [facility] 01/16/2015, 11:48 AM

## 2015-01-23 ENCOUNTER — Encounter: Payer: Self-pay | Admitting: Family Medicine

## 2015-01-23 ENCOUNTER — Ambulatory Visit (INDEPENDENT_AMBULATORY_CARE_PROVIDER_SITE_OTHER): Payer: Medicare Other | Admitting: Family Medicine

## 2015-01-23 VITALS — BP 110/65 | HR 69 | Temp 97.6°F | Ht 68.75 in | Wt 180.8 lb

## 2015-01-23 DIAGNOSIS — L209 Atopic dermatitis, unspecified: Secondary | ICD-10-CM

## 2015-01-23 DIAGNOSIS — L723 Sebaceous cyst: Secondary | ICD-10-CM | POA: Diagnosis not present

## 2015-01-23 DIAGNOSIS — L989 Disorder of the skin and subcutaneous tissue, unspecified: Secondary | ICD-10-CM | POA: Diagnosis not present

## 2015-01-23 MED ORDER — TRIAMCINOLONE ACETONIDE 0.5 % EX OINT: 1.0000 "application " | TOPICAL_OINTMENT | Freq: Two times a day (BID) | CUTANEOUS | Status: DC

## 2015-01-23 MED ORDER — DOXYCYCLINE HYCLATE 100 MG PO TABS: 100.0000 mg | ORAL_TABLET | Freq: Two times a day (BID) | ORAL | Status: DC

## 2015-01-23 NOTE — Progress Notes (Signed)
[name], male   DOB: June 01, 1941, 74 y.o.   MRN: [id number]   HPI  Patient presents today for follow-up lesion, rash, and sebaceous cyst  Cyst Patient and his wife explaining that she lanced it on Thursday night just to see if they could be resolved. However at that time it was not inflamed but then became inflamed afterwards. Erythematous and painful. Denies fever, malaise, appetite loss  Foot lesion He's been doing foot soaks, using mupirocin ointment, and keeping it clean. He feels like it's doing much better  Rash Patient had an itchy rash in his bilateral upper thighs and lower buttock for the last week. He said he is got new underwear started after that. He also worries that laundry detergent to wash out of his close light normal. He's not tried any vacations for the rash and states that it's very itchy.  PMH: Smoking status noted ROS: Per HPI, otherwise  Objective: BP 110/65 mmHg  Pulse 69  Temp(Src) 97.6 F (36.4 C) (Oral)  Ht 5' 8.75" (1.746 m)  Wt 180 lb 12.8 oz (82.01 kg)  BMI 26.90 kg/m2 Gen: NAD, alert, cooperative with exam HEENT: NCAT Ext: No edema, warm Neuro: Alert and oriented, No gross deficits Skin: Erythematous patches on the upper posterior right thigh and similar patch on the upper posterior left thigh, a few red papules scattered within, fine scale. Approximately 8 cm x 12 cm Foot lesion: Between third and fourth digit of the left foot there is a small amount of scaling on his skin, no open lesion, no drainage Back: Left upper back with erythematous change or nodule approximately 1 cm in diameter expresses thick white to yellow drainage with squeezing  Assessment and plan:  Foot lesion Healing well, 2-3 days more of foot soaks and mupirocin, then DC  Sebaceous cyst On mid back, lanced by his wife with subsequent erythema and pain Treat with doxy, reassurance If recurrent flares recommend excision  Atopic dermatitis On BL  thighs Kenalog ointment   Meds ordered this encounter  Medications  . triamcinolone ointment (KENALOG) 0.5 %    Sig: Apply 1 application topically 2 (two) times daily.    Dispense:  30 g    Refill:  0  . doxycycline (VIBRA-TABS) 100 MG tablet    Sig: Take 1 tablet (100 mg total) by mouth 2 (two) times daily. 1 po bid    Dispense:  14 tablet    Refill:  0    Laroy Apple, MD [facility] 01/23/2015, 3:33 PM

## 2015-01-23 NOTE — Patient Instructions (Signed)
Great to see you again!  Come back if your cyst doesn't get better or if you get sick (fever, appetite loss, malaise)  We could remove the cyst if it becomes a recurrent problem.   Use the steroid ointment on your legs.

## 2015-01-23 NOTE — Assessment & Plan Note (Signed)
On mid back, lanced by his wife with subsequent erythema and pain Treat with doxy, reassurance If recurrent flares recommend excision

## 2015-01-23 NOTE — Assessment & Plan Note (Signed)
On BL thighs Kenalog ointment

## 2015-01-23 NOTE — Assessment & Plan Note (Signed)
Healing well, 2-3 days more of foot soaks and mupirocin, then DC

## 2015-01-31 ENCOUNTER — Other Ambulatory Visit: Payer: Self-pay | Admitting: Family Medicine

## 2015-02-01 ENCOUNTER — Ambulatory Visit (INDEPENDENT_AMBULATORY_CARE_PROVIDER_SITE_OTHER): Payer: Medicare Other | Admitting: Family Medicine

## 2015-02-01 ENCOUNTER — Other Ambulatory Visit: Payer: Self-pay | Admitting: Family Medicine

## 2015-02-01 ENCOUNTER — Encounter: Payer: Self-pay | Admitting: Family Medicine

## 2015-02-01 VITALS — BP 128/65 | HR 62 | Temp 96.8°F | Ht 68.75 in | Wt 181.0 lb

## 2015-02-01 DIAGNOSIS — L723 Sebaceous cyst: Secondary | ICD-10-CM | POA: Diagnosis not present

## 2015-02-01 DIAGNOSIS — S60468A Insect bite (nonvenomous) of other finger, initial encounter: Secondary | ICD-10-CM

## 2015-02-01 DIAGNOSIS — S60463A Insect bite (nonvenomous) of left middle finger, initial encounter: Secondary | ICD-10-CM | POA: Diagnosis not present

## 2015-02-01 MED ORDER — DOXYCYCLINE HYCLATE 100 MG PO TABS: 100.0000 mg | ORAL_TABLET | Freq: Two times a day (BID) | ORAL | Status: DC

## 2015-02-01 NOTE — Patient Instructions (Signed)
Saline soaks Apply Bactroban ointment lightly twice daily Take doxycycline 100 mg twice daily for at least 7 more days Return to clinic as needed  keep the wound clean and dry

## 2015-02-01 NOTE — Progress Notes (Signed)
Subjective:    [name], male    DOB: 14-Apr-1941, 74 y.o.   MRN: [id number]  HPI Patient here today for a sore area / bite to his left middle finger. The actual area was blistered yesterday and this morning. The patient has recently had an abscess on his back and has been taking some doxycycline. He is also had a hornet sting. The area is somewhat pruritic and erythematous.       Patient Active Problem List   Diagnosis Date Noted  . Sebaceous cyst 01/23/2015  . Atopic dermatitis 01/23/2015  . Foot lesion 01/16/2015  . Thoracic disc herniation 06/20/2014  . Left varicocele 06/20/2014  . Abdominal aortic atherosclerosis 06/20/2014  . Ventral hernia without obstruction or gangrene 02/04/2014  . Personal history of colonic polyps 02/04/2014  . Hyperlipemia 12/30/2012  . Hypertension 12/30/2012  . BPH (benign prostatic hypertrophy) 12/30/2012  . Diabetes type 2, controlled 09/28/2012  . RBBB 04/11/2010  . CARDIOVASCULAR FUNCTION STUDY, ABNORMAL 04/11/2010  . HIATAL HERNIA, with GERD 09/14/2007  . NEPHROLITHIASIS 09/14/2007   Outpatient Encounter Prescriptions as of 02/01/2015  Medication Sig  . aspirin 81 MG tablet Take 81 mg by mouth daily.    . cholecalciferol (VITAMIN D) 1000 UNITS tablet Take 2,000 Units by mouth daily.   . Cinnamon 500 MG TABS Take 1,000 tablets by mouth 2 (two) times daily.  Marland Kitchen glucose blood (ONE TOUCH ULTRA TEST) test strip Check BS qid  Dx: E11.9  . Melatonin 5 MG TABS Take 1 tablet by mouth daily.    . metFORMIN (GLUCOPHAGE) 1000 MG tablet Take 1 tablet (1,000 mg total) by mouth 2 (two) times daily with a meal.  . mupirocin ointment (BACTROBAN) 2 % Place 1 application into the nose 2 (two) times daily.  Marland Kitchen NEXIUM 40 MG capsule TAKE ONE CAPSULE BY MOUTH ONE TIME DAILY  . ONETOUCH DELICA LANCETS 24M MISC   . simvastatin (ZOCOR) 40 MG tablet TAKE ONE TABLET BY MOUTH IN THE EVENING  . triamcinolone ointment (KENALOG) 0.5 % Apply 1  application topically 2 (two) times daily.  . valsartan-hydrochlorothiazide (DIOVAN-HCT) 320-25 MG per tablet TAKE ONE TABLET BY MOUTH ONE  TIME DAILY  . [DISCONTINUED] doxycycline (VIBRA-TABS) 100 MG tablet Take 1 tablet (100 mg total) by mouth 2 (two) times daily. 1 po bid   No facility-administered encounter medications on file as of 02/01/2015.      Review of Systems  Constitutional: Negative.   HENT: Negative.   Eyes: Negative.   Respiratory: Negative.   Cardiovascular: Negative.   Gastrointestinal: Negative.   Endocrine: Negative.   Genitourinary: Negative.   Musculoskeletal: Negative.   Skin: Negative.        Left middle finger sore / bite  Allergic/Immunologic: Negative.   Neurological: Negative.   Hematological: Negative.   Psychiatric/Behavioral: Negative.        Objective:   Physical Exam  Constitutional: He is oriented to person, place, and time. He appears well-developed and well-nourished. No distress.  HENT:  Head: Normocephalic.  Eyes: Conjunctivae and EOM are normal. Pupils are equal, round, and reactive to light. Right eye exhibits no discharge. Left eye exhibits no discharge.  Musculoskeletal: Normal range of motion.  Neurological: He is alert and oriented to person, place, and time.  Skin: Skin is warm. Rash noted. There is erythema. No pallor.  The radial side of the third middle finger is red and inflamed with no some small vesicles on it.  Psychiatric: He  has a normal mood and affect. His behavior is normal. Judgment and thought content normal.  Nursing note and vitals reviewed.  BP 128/65 mmHg  Pulse 62  Temp(Src) 96.8 F (36 C) (Oral)  Ht 5' 8.75" (1.746 m)  Wt 181 lb (82.101 kg)  BMI 26.93 kg/m2        Assessment & Plan:  1. Sebaceous cyst -This is improved - doxycycline (VIBRA-TABS) 100 MG tablet; Take 1 tablet (100 mg total) by mouth 2 (two) times daily. 1 po bid  Dispense: 60 tablet; Refill: 0  2. Insect bite (nonvenomous) of other  finger, initial encounter -Doxycycline 100 mg twice daily for 7-10 days and as directed  Patient Instructions  Saline soaks Apply Bactroban ointment lightly twice daily Take doxycycline 100 mg twice daily for at least 7 more days Return to clinic as needed  keep the wound clean and dry   Arrie Senate MD

## 2015-02-04 ENCOUNTER — Other Ambulatory Visit: Payer: Self-pay | Admitting: Family Medicine

## 2015-02-22 ENCOUNTER — Ambulatory Visit (INDEPENDENT_AMBULATORY_CARE_PROVIDER_SITE_OTHER): Payer: Medicare Other | Admitting: Family Medicine

## 2015-02-22 ENCOUNTER — Encounter: Payer: Self-pay | Admitting: Family Medicine

## 2015-02-22 VITALS — BP 113/64 | HR 61 | Temp 97.7°F | Ht 68.75 in | Wt 182.4 lb

## 2015-02-22 DIAGNOSIS — M10071 Idiopathic gout, right ankle and foot: Secondary | ICD-10-CM

## 2015-02-22 DIAGNOSIS — M109 Gout, unspecified: Secondary | ICD-10-CM

## 2015-02-22 NOTE — Progress Notes (Signed)
BP 113/64 mmHg  Pulse 61  Temp(Src) 97.7 F (36.5 C) (Oral)  Ht 5' 8.75" (1.746 m)  Wt 182 lb 6.4 oz (82.736 kg)  BMI 27.14 kg/m2   Subjective:    [name], male    DOB: 1940-08-14, 74 y.o.   MRN: [id number]  HPI: Joe [name] is a 74 y.o. male presenting on 02/22/2015 for Foot Pain   HPI Right great toe pain Patient has a 2 day history of right great toe pain. The pain has been causing him to have difficulty walking and exercising like he normally does. The pain is worse over the MTP joint of the right great toe. He said it was swollen yesterday but not as much today, it also was a little bit red yesterday but not as much today. He denies any fevers or chills or pain going anywhere else. The pain is 8 out of 10. He has tried Tylenol but nothing else far and it helped some.  Relevant past medical, surgical, family and social history reviewed and updated as indicated. Interim medical history since our last visit reviewed. Allergies and medications reviewed and updated.  Review of Systems  Constitutional: Negative for fever and chills.  HENT: Negative for ear discharge and ear pain.   Eyes: Negative for discharge and visual disturbance.  Respiratory: Negative for shortness of breath and wheezing.   Cardiovascular: Negative for chest pain and leg swelling.  Gastrointestinal: Negative for abdominal pain, diarrhea and constipation.  Genitourinary: Negative for difficulty urinating.  Musculoskeletal: Positive for joint swelling and arthralgias. Negative for back pain and gait problem.  Skin: Negative for rash.  Neurological: Negative for syncope, light-headedness and headaches.  All other systems reviewed and are negative.   Per HPI unless specifically indicated above     Medication List       This list is accurate as of: 02/22/15  9:59 AM.  Always use your most recent med list.               aspirin 81 MG tablet  Take 81 mg by mouth daily.     cholecalciferol 1000 UNITS tablet  Commonly known as:  VITAMIN D  Take 2,000 Units by mouth daily.     Cinnamon 500 MG Tabs  Take 1,000 tablets by mouth 2 (two) times daily.     doxycycline 100 MG tablet  Commonly known as:  VIBRA-TABS  Take 1 tablet (100 mg total) by mouth 2 (two) times daily. 1 po bid     glucose blood test strip  Commonly known as:  ONE TOUCH ULTRA TEST  Check BS qid  Dx: E11.9     Melatonin 5 MG Tabs  Take 1 tablet by mouth daily.     metFORMIN 1000 MG tablet  Commonly known as:  GLUCOPHAGE  TAKE ONE TABLET BY MOUTH TWICE A DAY WITH MEALS     mupirocin ointment 2 %  Commonly known as:  BACTROBAN  Place 1 application into the nose 2 (two) times daily.     NEXIUM 40 MG capsule  Generic drug:  esomeprazole  TAKE ONE CAPSULE BY MOUTH ONE TIME DAILY     ONETOUCH DELICA LANCETS 12X Misc     simvastatin 40 MG tablet  Commonly known as:  ZOCOR  TAKE ONE TABLET BY MOUTH IN THE EVENING     triamcinolone ointment 0.5 %  Commonly known as:  KENALOG  Apply 1 application topically 2 (two) times daily.  valsartan-hydrochlorothiazide 320-25 MG per tablet  Commonly known as:  DIOVAN-HCT  TAKE ONE TABLET BY MOUTH ONE  TIME DAILY           Objective:    BP 113/64 mmHg  Pulse 61  Temp(Src) 97.7 F (36.5 C) (Oral)  Ht 5' 8.75" (1.746 m)  Wt 182 lb 6.4 oz (82.736 kg)  BMI 27.14 kg/m2  Wt Readings from Last 3 Encounters:  02/22/15 182 lb 6.4 oz (82.736 kg)  02/01/15 181 lb (82.101 kg)  01/23/15 180 lb 12.8 oz (82.01 kg)    Physical Exam  Constitutional: He is oriented to person, place, and time. He appears well-developed and well-nourished. No distress.  Eyes: Conjunctivae and EOM are normal. Right eye exhibits no discharge. No scleral icterus.  Cardiovascular: Normal rate, regular rhythm, normal heart sounds and intact distal pulses.   No murmur heard. Pulmonary/Chest: Effort normal and breath sounds normal. No respiratory distress. He has no  wheezes.  Abdominal: He exhibits no distension.  Musculoskeletal: Normal range of motion. He exhibits tenderness. He exhibits no edema.       Right foot: There is tenderness (tenderness over the first MTP joint on the right foot, no swelling or warmth or erythema noted.). There is normal range of motion and no swelling.  Neurological: He is alert and oriented to person, place, and time. Coordination normal.  Skin: Skin is warm and dry. No rash noted. He is not diaphoretic.  Psychiatric: He has a normal mood and affect. His behavior is normal.  Vitals reviewed.   Results for orders placed or performed in visit on 01/16/15  POCT glycosylated hemoglobin (Hb A1C)  Result Value Ref Range   Hemoglobin A1C 6.6       Assessment & Plan:   Problem List Items Addressed This Visit    None    Visit Diagnoses    Acute gout of right foot, unspecified cause    -  Primary    Clinically suspicious for early acute gout. Recommended to take NSAIDs for this episode and if recurs will discuss colchicine. May need to d/c HCTZ in future    Relevant Orders    Uric acid        Follow up plan: Return if symptoms worsen or fail to improve.  Caryl Pina, MD [facility] 02/22/2015, 9:59 AM

## 2015-02-22 NOTE — Patient Instructions (Signed)
Gout Gout is an inflammatory arthritis caused by a buildup of uric acid crystals in the joints. Uric acid is a chemical that is normally present in the blood. When the level of uric acid in the blood is too high it can form crystals that deposit in your joints and tissues. This causes joint redness, soreness, and swelling (inflammation). Repeat attacks are common. Over time, uric acid crystals can form into masses (tophi) near a joint, destroying bone and causing disfigurement. Gout is treatable and often preventable. CAUSES  The disease begins with elevated levels of uric acid in the blood. Uric acid is produced by your body when it breaks down a naturally found substance called purines. Certain foods you eat, such as meats and fish, contain high amounts of purines. Causes of an elevated uric acid level include:  Being passed down from parent to child (heredity).  Diseases that cause increased uric acid production (such as obesity, psoriasis, and certain cancers).  Excessive alcohol use.  Diet, especially diets rich in meat and seafood.  Medicines, including certain cancer-fighting medicines (chemotherapy), water pills (diuretics), and aspirin.  Chronic kidney disease. The kidneys are no longer able to remove uric acid well.  Problems with metabolism. Conditions strongly associated with gout include:  Obesity.  High blood pressure.  High cholesterol.  Diabetes. Not everyone with elevated uric acid levels gets gout. It is not understood why some people get gout and others do not. Surgery, joint injury, and eating too much of certain foods are some of the factors that can lead to gout attacks. SYMPTOMS   An attack of gout comes on quickly. It causes intense pain with redness, swelling, and warmth in a joint.  Fever can occur.  Often, only one joint is involved. Certain joints are more commonly involved:  Base of the big toe.  Knee.  Ankle.  Wrist.  Finger. Without  treatment, an attack usually goes away in a few days to weeks. Between attacks, you usually will not have symptoms, which is different from many other forms of arthritis. DIAGNOSIS  Your caregiver will suspect gout based on your symptoms and exam. In some cases, tests may be recommended. The tests may include:  Blood tests.  Urine tests.  X-rays.  Joint fluid exam. This exam requires a needle to remove fluid from the joint (arthrocentesis). Using a microscope, gout is confirmed when uric acid crystals are seen in the joint fluid. TREATMENT  There are two phases to gout treatment: treating the sudden onset (acute) attack and preventing attacks (prophylaxis).  Treatment of an Acute Attack.  Medicines are used. These include anti-inflammatory medicines or steroid medicines.  An injection of steroid medicine into the affected joint is sometimes necessary.  The painful joint is rested. Movement can worsen the arthritis.  You may use warm or cold treatments on painful joints, depending which works best for you.  Treatment to Prevent Attacks.  If you suffer from frequent gout attacks, your caregiver may advise preventive medicine. These medicines are started after the acute attack subsides. These medicines either help your kidneys eliminate uric acid from your body or decrease your uric acid production. You may need to stay on these medicines for a very long time.  The early phase of treatment with preventive medicine can be associated with an increase in acute gout attacks. For this reason, during the first few months of treatment, your caregiver may also advise you to take medicines usually used for acute gout treatment. Be sure you   understand your caregiver's directions. Your caregiver may make several adjustments to your medicine dose before these medicines are effective.  Gout Gout is when your joints become red, sore, and swell (inflamed). This is caused by the buildup of uric acid  crystals in the joints. Uric acid is a chemical that is normally in the blood. If the level of uric acid gets too high in the blood, these crystals form in your joints and tissues. Over time, these crystals can form into masses near the joints and tissues. These masses can destroy bone and cause the bone to look misshapen (deformed). HOME CARE   Do not take aspirin for pain.  Only take medicine as told by your doctor.  Rest the joint as much as you can. When in bed, keep sheets and blankets off painful areas.  Keep the sore joints raised (elevated).  Put warm or cold packs on painful joints. Use of warm or cold packs depends on which works best for you.  Use crutches if the painful joint is in your leg.  Drink enough fluids to keep your pee (urine) clear or pale yellow. Limit alcohol, sugary drinks, and drinks with fructose in them.  Follow your diet instructions. Pay careful attention to how much protein you eat. Include fruits, vegetables, whole grains, and fat-free or low-fat milk products in your daily diet. Talk to your doctor or dietitian about the use of coffee, vitamin C, and cherries. These may help lower uric acid levels.  Keep a healthy body weight. GET HELP RIGHT AWAY IF:   You have watery poop (diarrhea), throw up (vomit), or have any side effects from medicines.  You do not feel better in 24 hours, or you are getting worse.  Your joint becomes suddenly more tender, and you have chills or a fever. MAKE SURE YOU:   Understand these instructions.  Will watch your condition.  Will get help right away if you are not doing well or get worse. Document Released: 02/27/2008 Document Revised: 10/04/2013 Document Reviewed: 01/01/2012 [facility] Patient Information 2015 [place], [place]. This information is not intended to replace advice given to you by your health care provider. Make sure you discuss any questions you have with your health care provider.  nt with your caregiver  or dietitian. Alcohol and drinks high in sugar and fructose and foods such as meat, poultry, and seafood can increase uric acid levels. Your caregiver or dietitian can advise you on drinks and foods that should be limited. HOME CARE INSTRUCTIONS   Do not take aspirin to relieve pain. This raises uric acid levels.  Only take over-the-counter or prescription medicines for pain, discomfort, or fever as directed by your caregiver.  Rest the joint as much as possible. When in bed, keep sheets and blankets off painful areas.  Keep the affected joint raised (elevated).  Apply warm or cold treatments to painful joints. Use of warm or cold treatments depends on which works best for you.  Use crutches if the painful joint is in your leg.  Drink enough fluids to keep your urine clear or pale yellow. This helps your body get rid of uric acid. Limit alcohol, sugary drinks, and fructose drinks.  Follow your dietary instructions. Pay careful attention to the amount of protein you eat. Your daily diet should emphasize fruits, vegetables, whole grains, and fat-free or low-fat milk products. Discuss the use of coffee, vitamin C, and cherries with your caregiver or dietitian. These may be helpful in lowering uric acid levels.  Maintain a healthy body weight. SEEK MEDICAL CARE IF:   You develop diarrhea, vomiting, or any side effects from medicines.  You do not feel better in 24 hours, or you are getting worse. SEEK IMMEDIATE MEDICAL CARE IF:   Your joint becomes suddenly more tender, and you have chills or a fever. MAKE SURE YOU:   Understand these instructions.  Will watch your condition.  Will get help right away if you are not doing well or get worse. Document Released: 05/17/2000 Document Revised: 10/04/2013 Document Reviewed: 01/01/2012 [facility] Patient Information 2015 [place], [place]. This information is not intended to replace advice given to you by your health care provider. Make sure you  discuss any questions you have with your health care provider.

## 2015-02-23 LAB — URIC ACID: Uric Acid: 7.3 mg/dL (ref 3.7–8.6)

## 2015-03-17 ENCOUNTER — Encounter: Payer: Self-pay | Admitting: Family Medicine

## 2015-03-17 ENCOUNTER — Ambulatory Visit (INDEPENDENT_AMBULATORY_CARE_PROVIDER_SITE_OTHER): Payer: Medicare Other | Admitting: Family Medicine

## 2015-03-17 VITALS — BP 123/68 | HR 53 | Temp 97.5°F | Ht 68.75 in | Wt 183.0 lb

## 2015-03-17 DIAGNOSIS — Z23 Encounter for immunization: Secondary | ICD-10-CM | POA: Diagnosis not present

## 2015-03-17 DIAGNOSIS — E119 Type 2 diabetes mellitus without complications: Secondary | ICD-10-CM

## 2015-03-17 DIAGNOSIS — I10 Essential (primary) hypertension: Secondary | ICD-10-CM

## 2015-03-17 DIAGNOSIS — E785 Hyperlipidemia, unspecified: Secondary | ICD-10-CM

## 2015-03-17 DIAGNOSIS — E559 Vitamin D deficiency, unspecified: Secondary | ICD-10-CM | POA: Diagnosis not present

## 2015-03-17 DIAGNOSIS — G59 Mononeuropathy in diseases classified elsewhere: Secondary | ICD-10-CM

## 2015-03-17 LAB — POCT GLYCOSYLATED HEMOGLOBIN (HGB A1C): HEMOGLOBIN A1C: 6.9

## 2015-03-17 NOTE — Progress Notes (Signed)
Subjective:    [name], male    DOB: 06-May-1941, 74 y.o.   MRN: [id number]  HPI Pt here for follow up and management of chronic medical problems which includes hypertension, hyperlipidemia, and diabetes. He is taking medications regularly. The patient brings in blood pressures for review and the majority of these are good ranging anywhere from the 120s to the mid 140s but averaging in the 120s and 130s. He also brings in blood sugars for review and these could be better. The fasting blood sugars are running in the 140-150 range on the average and the bedtime blood sugars may be as high as 230 but probably averaging more in the 170 range. All of these will be scanned into the record. The patient does describe some numbness on the top of his left foot. He also has a history of back pain and osteoarthritis. He denies chest pain shortness of breath trouble swallowing. He says his heartburn has been under good control. He denies blood in the stool or black tarry bowel movements. He is passing his water without problems.      Patient Active Problem List   Diagnosis Date Noted  . Sebaceous cyst 01/23/2015  . Atopic dermatitis 01/23/2015  . Foot lesion 01/16/2015  . Thoracic disc herniation 06/20/2014  . Left varicocele 06/20/2014  . Abdominal aortic atherosclerosis ([place]) 06/20/2014  . Ventral hernia without obstruction or gangrene 02/04/2014  . Personal history of colonic polyps 02/04/2014  . Hyperlipemia 12/30/2012  . Hypertension 12/30/2012  . BPH (benign prostatic hypertrophy) 12/30/2012  . Diabetes type 2, controlled ([place]) 09/28/2012  . RBBB 04/11/2010  . CARDIOVASCULAR FUNCTION STUDY, ABNORMAL 04/11/2010  . HIATAL HERNIA, with GERD 09/14/2007  . NEPHROLITHIASIS 09/14/2007   Outpatient Encounter Prescriptions as of 03/17/2015  Medication Sig  . aspirin 81 MG tablet Take 81 mg by mouth daily.    . cholecalciferol (VITAMIN D) 1000 UNITS tablet Take 2,000 Units by  mouth daily.   . Cinnamon 500 MG TABS Take 1,000 tablets by mouth 2 (two) times daily.  [place] Kitchen glucose blood (ONE TOUCH ULTRA TEST) test strip Check BS qid  Dx: E11.9  . Melatonin 5 MG TABS Take 1 tablet by mouth daily.    . metFORMIN (GLUCOPHAGE) 1000 MG tablet TAKE ONE TABLET BY MOUTH TWICE A DAY WITH MEALS  . NEXIUM 40 MG capsule TAKE ONE CAPSULE BY MOUTH ONE TIME DAILY  . ONETOUCH DELICA LANCETS 24O MISC   . simvastatin (ZOCOR) 40 MG tablet TAKE ONE TABLET BY MOUTH IN THE EVENING  . triamcinolone ointment (KENALOG) 0.5 % Apply 1 application topically 2 (two) times daily.  . valsartan-hydrochlorothiazide (DIOVAN-HCT) 320-25 MG per tablet TAKE ONE TABLET BY MOUTH ONE  TIME DAILY  . doxycycline (VIBRA-TABS) 100 MG tablet Take 1 tablet (100 mg total) by mouth 2 (two) times daily. 1 po bid (Patient not taking: Reported on 02/22/2015)  . mupirocin ointment (BACTROBAN) 2 % Place 1 application into the nose 2 (two) times daily. (Patient not taking: Reported on 03/17/2015)   No facility-administered encounter medications on file as of 03/17/2015.      Review of Systems  Constitutional: Negative.   HENT: Negative.   Eyes: Negative.   Respiratory: Negative.   Cardiovascular: Negative.   Gastrointestinal: Negative.   Endocrine: Negative.   Genitourinary: Negative.   Musculoskeletal: Negative.   Skin: Negative.   Allergic/Immunologic: Negative.   Neurological: Negative.   Hematological: Negative.   Psychiatric/Behavioral: Negative.  Objective:   Physical Exam  Constitutional: He is oriented to person, place, and time. He appears well-developed and well-nourished. No distress.  HENT:  Head: Normocephalic and atraumatic.  Right Ear: External ear normal.  Left Ear: External ear normal.  Nose: Nose normal.  Mouth/Throat: Oropharynx is clear and moist. No oropharyngeal exudate.  Eyes: Conjunctivae and EOM are normal. Pupils are equal, round, and reactive to light. Right eye exhibits no  discharge. Left eye exhibits no discharge. No scleral icterus.  Neck: Normal range of motion. Neck supple. No thyromegaly present.  No carotid bruits thyromegaly or anterior cervical adenopathy  Cardiovascular: Normal rate, regular rhythm and intact distal pulses.   No murmur heard. At 60/m  Pulmonary/Chest: Effort normal and breath sounds normal. No respiratory distress. He has no wheezes. He has no rales. He exhibits no tenderness.  Clear anteriorly and posteriorly without axillary adenopathy  Abdominal: Soft. Bowel sounds are normal. He exhibits no mass. There is no tenderness. There is no rebound and no guarding.  No epigastric tenderness suprapubic tenderness or inguinal adenopathy  Musculoskeletal: Normal range of motion. He exhibits no edema or tenderness.  Lymphadenopathy:    He has no cervical adenopathy.  Neurological: He is alert and oriented to person, place, and time. He has normal reflexes. No cranial nerve deficit.  Skin: Skin is warm and dry. No rash noted.  Psychiatric: He has a normal mood and affect. His behavior is normal. Judgment and thought content normal.  Nursing note and vitals reviewed.  BP 123/68 mmHg  Pulse 53  Temp(Src) 97.5 F (36.4 C) (Oral)  Ht 5' 8.75" (1.746 m)  Wt 183 lb (83.008 kg)  BMI 27.23 kg/m2        Assessment & Plan:  1. Hyperlipemia -Continue current treatment pending results of lab work - CBC with Differential/Platelet - NMR, lipoprofile  2. Vitamin D deficiency -Continue current treatment pending results of lab work - CBC with Differential/Platelet - Vit D  25 hydroxy (rtn osteoporosis monitoring)  3. Essential hypertension -The blood pressure readings from home and in the office today are good and he will continue with current treatment - BMP8+EGFR - CBC with Differential/Platelet - Hepatic function panel  4. Controlled type 2 diabetes mellitus without complication, without long-term current use of insulin ([place]) -Home  blood sugar readings are somewhat variable. The patient indicates he is not been sticking to his diet as regularly as he should and has been trying to do better with this over the past couple weeks. - POCT glycosylated hemoglobin (Hb A1C) - CBC with Differential/Platelet  5. Encounter for immunization -He will receive his flu shot today  6. Mononeuropathy due to underlying disease -The patient does have diabetes as well as degenerative disc disease in his back and this is most likely secondary to one or both of these problems and has to do mostly with numbness on the top of the left foot at times. We will continue to monitor this. We will continue to encourage good blood sugar control.  Patient Instructions                       Medicare Annual Wellness Visit  [facility] and the medical providers at [facility] strive to bring you the best medical care.  In doing so we not only want to address your current medical conditions and concerns but also to detect new conditions early and prevent illness, disease and health-related problems.  Medicare offers a yearly Wellness Visit which allows our clinical staff to assess your need for preventative services including immunizations, lifestyle education, counseling to decrease risk of preventable diseases and screening for fall risk and other medical concerns.    This visit is provided free of charge (no copay) for all Medicare recipients. The clinical pharmacists at [facility] have begun to conduct these Wellness Visits which will also include a thorough review of all your medications.    As you primary medical provider recommend that you make an appointment for your Annual Wellness Visit if you have not done so already this year.  You may set up this appointment before you leave today or you may call back [phone]) and schedule an appointment.  Please make sure when you call that you mention that you are  scheduling your Annual Wellness Visit with the clinical pharmacist so that the appointment may be made for the proper length of time.     Continue current medications. Continue good therapeutic lifestyle changes which include good diet and exercise. Fall precautions discussed with patient. If an FOBT was given today- please return it to our front desk. If you are over 38 years old - you may need Prevnar 55 or the adult Pneumonia vaccine.  **Flu shots will be available soon--- please call and schedule a FLU-CLINIC appointment**  After your visit with [place] today you will receive a survey in the mail or online from [employer] regarding your care with [place]. Please take a moment to fill this out. Your feedback is very important to [place] as you can help [place] better understand your patient needs as well as improve your experience and satisfaction. WE CARE ABOUT YOU!!!    This winter, drink plenty of fluids and stay well hydrated and keep the house as cool as possible Continue to exercise regularly and monitor blood sugars and watch your feet closely for any sign of infection   Arrie Senate MD

## 2015-03-17 NOTE — Patient Instructions (Addendum)
Medicare Annual Wellness Visit  [facility] and the medical providers at [facility] strive to bring you the best medical care.  In doing so we not only want to address your current medical conditions and concerns but also to detect new conditions early and prevent illness, disease and health-related problems.    Medicare offers a yearly Wellness Visit which allows our clinical staff to assess your need for preventative services including immunizations, lifestyle education, counseling to decrease risk of preventable diseases and screening for fall risk and other medical concerns.    This visit is provided free of charge (no copay) for all Medicare recipients. The clinical pharmacists at [facility] have begun to conduct these Wellness Visits which will also include a thorough review of all your medications.    As you primary medical provider recommend that you make an appointment for your Annual Wellness Visit if you have not done so already this year.  You may set up this appointment before you leave today or you may call back ([phone]) and schedule an appointment.  Please make sure when you call that you mention that you are scheduling your Annual Wellness Visit with the clinical pharmacist so that the appointment may be made for the proper length of time.     Continue current medications. Continue good therapeutic lifestyle changes which include good diet and exercise. Fall precautions discussed with patient. If an FOBT was given today- please return it to our front desk. If you are over 45 years old - you may need Prevnar 63 or the adult Pneumonia vaccine.  **Flu shots will be available soon--- please call and schedule a FLU-CLINIC appointment**  After your visit with [place] today you will receive a survey in the mail or online from [employer] regarding your care with [place]. Please take a moment to fill this out. Your feedback is  very important to [place] as you can help [place] better understand your patient needs as well as improve your experience and satisfaction. WE CARE ABOUT YOU!!!    This winter, drink plenty of fluids and stay well hydrated and keep the house as cool as possible Continue to exercise regularly and monitor blood sugars and watch your feet closely for any sign of infection

## 2015-03-18 ENCOUNTER — Encounter: Payer: Self-pay | Admitting: Family Medicine

## 2015-03-18 DIAGNOSIS — D696 Thrombocytopenia, unspecified: Secondary | ICD-10-CM | POA: Insufficient documentation

## 2015-03-18 LAB — CBC WITH DIFFERENTIAL/PLATELET
BASOS ABS: 0.1 10*3/uL (ref 0.0–0.2)
Basos: 1 %
EOS (ABSOLUTE): 0.8 10*3/uL — ABNORMAL HIGH (ref 0.0–0.4)
EOS: 13 %
HEMATOCRIT: 35.7 % — AB (ref 37.5–51.0)
HEMOGLOBIN: 12.4 g/dL — AB (ref 12.6–17.7)
IMMATURE GRANS (ABS): 0 10*3/uL (ref 0.0–0.1)
Immature Granulocytes: 0 %
LYMPHS: 20 %
Lymphocytes Absolute: 1.1 10*3/uL (ref 0.7–3.1)
MCH: 32.5 pg (ref 26.6–33.0)
MCHC: 34.7 g/dL (ref 31.5–35.7)
MCV: 94 fL (ref 79–97)
MONOCYTES: 8 %
Monocytes Absolute: 0.5 10*3/uL (ref 0.1–0.9)
NEUTROS ABS: 3.4 10*3/uL (ref 1.4–7.0)
Neutrophils: 58 %
Platelets: 140 10*3/uL — ABNORMAL LOW (ref 150–379)
RBC: 3.82 x10E6/uL — ABNORMAL LOW (ref 4.14–5.80)
RDW: 13.2 % (ref 12.3–15.4)
WBC: 5.9 10*3/uL (ref 3.4–10.8)

## 2015-03-18 LAB — NMR, LIPOPROFILE
Cholesterol: 130 mg/dL (ref 100–199)
HDL Cholesterol by NMR: 42 mg/dL (ref >39)
HDL Particle Number: 30.2 umol/L — ABNORMAL LOW (ref >=30.5)
LDL PARTICLE NUMBER: 642 nmol/L (ref <1000)
LDL SIZE: 20.7 nm (ref >20.5)
LDL-C: 63 mg/dL (ref 0–99)
LP-IR SCORE: 47 — AB (ref <=45)
Small LDL Particle Number: 179 nmol/L (ref <=527)
TRIGLYCERIDES BY NMR: 123 mg/dL (ref 0–149)

## 2015-03-18 LAB — HEPATIC FUNCTION PANEL
ALBUMIN: 4.4 g/dL (ref 3.5–4.8)
ALK PHOS: 55 IU/L (ref 39–117)
ALT: 23 IU/L (ref 0–44)
AST: 24 IU/L (ref 0–40)
BILIRUBIN, DIRECT: 0.22 mg/dL (ref 0.00–0.40)
Bilirubin Total: 0.8 mg/dL (ref 0.0–1.2)
TOTAL PROTEIN: 6.6 g/dL (ref 6.0–8.5)

## 2015-03-18 LAB — BMP8+EGFR
BUN/Creatinine Ratio: 13 (ref 10–22)
BUN: 15 mg/dL (ref 8–27)
CALCIUM: 9.2 mg/dL (ref 8.6–10.2)
CHLORIDE: 99 mmol/L (ref 97–108)
CO2: 23 mmol/L (ref 18–29)
CREATININE: 1.14 mg/dL (ref 0.76–1.27)
GFR, EST AFRICAN AMERICAN: 73 mL/min/{1.73_m2} (ref >59)
GFR, EST NON AFRICAN AMERICAN: 63 mL/min/{1.73_m2} (ref >59)
Glucose: 121 mg/dL — ABNORMAL HIGH (ref 65–99)
Potassium: 4.7 mmol/L (ref 3.5–5.2)
Sodium: 138 mmol/L (ref 134–144)

## 2015-03-18 LAB — VITAMIN D 25 HYDROXY (VIT D DEFICIENCY, FRACTURES): Vit D, 25-Hydroxy: 37.3 ng/mL (ref 30.0–100.0)

## 2015-04-24 ENCOUNTER — Other Ambulatory Visit: Payer: Medicare Other

## 2015-04-24 ENCOUNTER — Other Ambulatory Visit: Payer: Self-pay | Admitting: Family Medicine

## 2015-04-24 DIAGNOSIS — Z1212 Encounter for screening for malignant neoplasm of rectum: Secondary | ICD-10-CM

## 2015-04-25 ENCOUNTER — Other Ambulatory Visit: Payer: Self-pay | Admitting: Family Medicine

## 2015-04-25 ENCOUNTER — Telehealth: Payer: Self-pay | Admitting: Family Medicine

## 2015-04-30 LAB — FECAL OCCULT BLOOD, IMMUNOCHEMICAL: Fecal Occult Bld: NEGATIVE

## 2015-05-01 ENCOUNTER — Other Ambulatory Visit: Payer: Self-pay | Admitting: *Deleted

## 2015-05-01 MED ORDER — AZITHROMYCIN 250 MG PO TABS: ORAL_TABLET | ORAL | Status: DC

## 2015-05-10 DIAGNOSIS — L508 Other urticaria: Secondary | ICD-10-CM | POA: Diagnosis not present

## 2015-05-23 DIAGNOSIS — L508 Other urticaria: Secondary | ICD-10-CM | POA: Diagnosis not present

## 2015-05-23 DIAGNOSIS — L309 Dermatitis, unspecified: Secondary | ICD-10-CM | POA: Diagnosis not present

## 2015-06-20 DIAGNOSIS — H9313 Tinnitus, bilateral: Secondary | ICD-10-CM | POA: Diagnosis not present

## 2015-06-20 DIAGNOSIS — H903 Sensorineural hearing loss, bilateral: Secondary | ICD-10-CM | POA: Diagnosis not present

## 2015-06-20 DIAGNOSIS — H6123 Impacted cerumen, bilateral: Secondary | ICD-10-CM | POA: Diagnosis not present

## 2015-06-28 ENCOUNTER — Other Ambulatory Visit: Payer: Self-pay | Admitting: *Deleted

## 2015-06-28 MED ORDER — GLUCOSE BLOOD VI STRP
ORAL_STRIP | Status: DC
Start: 2015-06-28 — End: 2019-04-27

## 2015-06-28 MED ORDER — GLUCOSE BLOOD VI STRP: ORAL_STRIP | Status: DC

## 2015-07-20 ENCOUNTER — Telehealth: Payer: Self-pay | Admitting: Pharmacist

## 2015-07-20 NOTE — Telephone Encounter (Signed)
Patient's wife calls and states his BG has been elevated - 160's in am and would like to take an extra 1/2 tablet of metformin.  This is OK since max dose if 2500mg  daily although I did advise that this might not do much to bring BG to goal.  Patient has follow up with his PCP 07/31/15 and can discuss if additional medication needed at that time.

## 2015-07-26 ENCOUNTER — Other Ambulatory Visit: Payer: Self-pay | Admitting: *Deleted

## 2015-07-26 MED ORDER — VALSARTAN-HYDROCHLOROTHIAZIDE 320-25 MG PO TABS: ORAL_TABLET | ORAL | Status: DC

## 2015-07-26 MED ORDER — METFORMIN HCL 1000 MG PO TABS: ORAL_TABLET | ORAL | Status: DC

## 2015-07-28 ENCOUNTER — Other Ambulatory Visit (INDEPENDENT_AMBULATORY_CARE_PROVIDER_SITE_OTHER): Payer: Medicare Other

## 2015-07-28 DIAGNOSIS — E559 Vitamin D deficiency, unspecified: Secondary | ICD-10-CM | POA: Diagnosis not present

## 2015-07-28 DIAGNOSIS — I10 Essential (primary) hypertension: Secondary | ICD-10-CM

## 2015-07-28 DIAGNOSIS — E119 Type 2 diabetes mellitus without complications: Secondary | ICD-10-CM

## 2015-07-28 DIAGNOSIS — E785 Hyperlipidemia, unspecified: Secondary | ICD-10-CM | POA: Diagnosis not present

## 2015-07-28 LAB — POCT GLYCOSYLATED HEMOGLOBIN (HGB A1C): Hemoglobin A1C: 7.3

## 2015-07-28 NOTE — Progress Notes (Signed)
Lab only 

## 2015-07-29 LAB — BMP8+EGFR
BUN/Creatinine Ratio: 14 (ref 10–22)
BUN: 17 mg/dL (ref 8–27)
CO2: 18 mmol/L (ref 18–29)
CREATININE: 1.19 mg/dL (ref 0.76–1.27)
Calcium: 8.7 mg/dL (ref 8.6–10.2)
Chloride: 100 mmol/L (ref 96–106)
GFR calc Af Amer: 69 mL/min/{1.73_m2} (ref >59)
GFR, EST NON AFRICAN AMERICAN: 60 mL/min/{1.73_m2} (ref >59)
Glucose: 201 mg/dL — ABNORMAL HIGH (ref 65–99)
Potassium: 4.4 mmol/L (ref 3.5–5.2)
SODIUM: 138 mmol/L (ref 134–144)

## 2015-07-29 LAB — CBC WITH DIFFERENTIAL/PLATELET
BASOS ABS: 0.1 10*3/uL (ref 0.0–0.2)
BASOS: 1 %
EOS (ABSOLUTE): 0.4 10*3/uL (ref 0.0–0.4)
Eos: 9 %
Hematocrit: 33.5 % — ABNORMAL LOW (ref 37.5–51.0)
Hemoglobin: 11.8 g/dL — ABNORMAL LOW (ref 12.6–17.7)
IMMATURE GRANS (ABS): 0 10*3/uL (ref 0.0–0.1)
Immature Granulocytes: 0 %
LYMPHS ABS: 0.9 10*3/uL (ref 0.7–3.1)
Lymphs: 21 %
MCH: 32.5 pg (ref 26.6–33.0)
MCHC: 35.2 g/dL (ref 31.5–35.7)
MCV: 92 fL (ref 79–97)
MONOS ABS: 0.3 10*3/uL (ref 0.1–0.9)
Monocytes: 8 %
NEUTROS ABS: 2.7 10*3/uL (ref 1.4–7.0)
Neutrophils: 61 %
PLATELETS: 132 10*3/uL — AB (ref 150–379)
RBC: 3.63 x10E6/uL — ABNORMAL LOW (ref 4.14–5.80)
RDW: 12.9 % (ref 12.3–15.4)
WBC: 4.4 10*3/uL (ref 3.4–10.8)

## 2015-07-29 LAB — HEPATIC FUNCTION PANEL
ALK PHOS: 48 IU/L (ref 39–117)
ALT: 23 IU/L (ref 0–44)
AST: 21 IU/L (ref 0–40)
Albumin: 4.1 g/dL (ref 3.5–4.8)
BILIRUBIN TOTAL: 0.5 mg/dL (ref 0.0–1.2)
BILIRUBIN, DIRECT: 0.16 mg/dL (ref 0.00–0.40)
Total Protein: 6.1 g/dL (ref 6.0–8.5)

## 2015-07-29 LAB — NMR, LIPOPROFILE
CHOLESTEROL: 118 mg/dL (ref 100–199)
HDL CHOLESTEROL BY NMR: 40 mg/dL (ref >39)
HDL PARTICLE NUMBER: 27.4 umol/L — AB (ref >=30.5)
LDL PARTICLE NUMBER: 678 nmol/L (ref <1000)
LDL Size: 20.2 nm (ref >20.5)
LDL-C: 48 mg/dL (ref 0–99)
LP-IR SCORE: 62 — AB (ref <=45)
Small LDL Particle Number: 399 nmol/L (ref <=527)
TRIGLYCERIDES BY NMR: 152 mg/dL — AB (ref 0–149)

## 2015-07-29 LAB — VITAMIN D 25 HYDROXY (VIT D DEFICIENCY, FRACTURES): VIT D 25 HYDROXY: 39 ng/mL (ref 30.0–100.0)

## 2015-07-31 ENCOUNTER — Ambulatory Visit (INDEPENDENT_AMBULATORY_CARE_PROVIDER_SITE_OTHER): Payer: Medicare Other | Admitting: Family Medicine

## 2015-07-31 ENCOUNTER — Encounter: Payer: Self-pay | Admitting: Family Medicine

## 2015-07-31 VITALS — BP 117/66 | HR 52 | Temp 97.4°F | Ht 68.72 in | Wt 182.0 lb

## 2015-07-31 DIAGNOSIS — K449 Diaphragmatic hernia without obstruction or gangrene: Secondary | ICD-10-CM | POA: Diagnosis not present

## 2015-07-31 DIAGNOSIS — K219 Gastro-esophageal reflux disease without esophagitis: Secondary | ICD-10-CM

## 2015-07-31 DIAGNOSIS — I10 Essential (primary) hypertension: Secondary | ICD-10-CM | POA: Diagnosis not present

## 2015-07-31 DIAGNOSIS — N400 Enlarged prostate without lower urinary tract symptoms: Secondary | ICD-10-CM | POA: Diagnosis not present

## 2015-07-31 DIAGNOSIS — E559 Vitamin D deficiency, unspecified: Secondary | ICD-10-CM | POA: Diagnosis not present

## 2015-07-31 DIAGNOSIS — I700 Atherosclerosis of aorta: Secondary | ICD-10-CM | POA: Diagnosis not present

## 2015-07-31 DIAGNOSIS — E785 Hyperlipidemia, unspecified: Secondary | ICD-10-CM

## 2015-07-31 DIAGNOSIS — D696 Thrombocytopenia, unspecified: Secondary | ICD-10-CM

## 2015-07-31 DIAGNOSIS — E119 Type 2 diabetes mellitus without complications: Secondary | ICD-10-CM

## 2015-07-31 DIAGNOSIS — I861 Scrotal varices: Secondary | ICD-10-CM | POA: Diagnosis not present

## 2015-07-31 LAB — POCT URINALYSIS DIPSTICK
BILIRUBIN UA: NEGATIVE
Ketones, UA: NEGATIVE
LEUKOCYTES UA: NEGATIVE
NITRITE UA: NEGATIVE
Protein, UA: NEGATIVE
RBC UA: NEGATIVE
Spec Grav, UA: 1.01
Urobilinogen, UA: NEGATIVE
pH, UA: 5

## 2015-07-31 LAB — POCT UA - MICROSCOPIC ONLY
BACTERIA, U MICROSCOPIC: NEGATIVE
CRYSTALS, UR, HPF, POC: NEGATIVE
Casts, Ur, LPF, POC: NEGATIVE
RBC, URINE, MICROSCOPIC: NEGATIVE
WBC, Ur, HPF, POC: NEGATIVE
Yeast, UA: NEGATIVE

## 2015-07-31 NOTE — Patient Instructions (Addendum)
Medicare Annual Wellness Visit  [facility] and the medical providers at [facility] strive to bring you the best medical care.  In doing so we not only want to address your current medical conditions and concerns but also to detect new conditions early and prevent illness, disease and health-related problems.    Medicare offers a yearly Wellness Visit which allows our clinical staff to assess your need for preventative services including immunizations, lifestyle education, counseling to decrease risk of preventable diseases and screening for fall risk and other medical concerns.    This visit is provided free of charge (no copay) for all Medicare recipients. The clinical pharmacists at [facility] have begun to conduct these Wellness Visits which will also include a thorough review of all your medications.    As you primary medical provider recommend that you make an appointment for your Annual Wellness Visit if you have not done so already this year.  You may set up this appointment before you leave today or you may call back [phone] and schedule an appointment.  Please make sure when you call that you mention that you are scheduling your Annual Wellness Visit with the clinical pharmacist so that the appointment may be made for the proper length of time.     Continue current medications. Continue good therapeutic lifestyle changes which include good diet and exercise. Fall precautions discussed with patient. If an FOBT was given today- please return it to our front desk. If you are over 62 years old - you may need Prevnar 63 or the adult Pneumonia vaccine.  **Flu shots are available--- please call and schedule a FLU-CLINIC appointment**  After your visit with [place] today you will receive a survey in the mail or online from [employer] regarding your care with [place]. Please take a moment to fill this out. Your feedback is very  important to [place] as you can help [place] better understand your patient needs as well as improve your experience and satisfaction. WE CARE ABOUT YOU!!!   If the patient decides to reduce the Nexium he should reduce this gradually and substitute Zantac or ranitidine over-the-counter 150 mg twice daily before breakfast and supper He may experience some increased heartburn and if he does he may need to take 300 mg of ranitidine twice daily. He should take the Januvia as directed one half pill daily and bring blood sugars in for review in 4 weeks to meet with the clinical pharmacist If the blood sugars run low he should discontinue the Januvia He should continue with the exercise regimen and dietary regimen As far as the left side pain and back pain is concerned if this is not improved he should come back and get some x-rays of his back. In the meantime use warm wet compresses and Tylenol. We will call with the urinalysis result when that result becomes available Continue to monitor blood sugars regularly

## 2015-07-31 NOTE — Progress Notes (Signed)
Subjective:    [name], male    DOB: 1940/11/02, 75 y.o.   MRN: [id number]  HPI Pt here for follow up and management of chronic medical problems which includes diabetes. He is taking medications regularly. The patient is complaining of some back pain with left side pain. This patient has had recent lab work done and this will be reviewed with him during the visit today. He brings in blood sugars for review and he has several blood sugars over the past month they have been greater than 200 fasting and even at bedtime. Overall blood pressures are brought in for review are good. The patient denies any chest pain or shortness of breath. He has not had any problems with his reflux but is questioning the continued use of Nexium and we discussed this before we started the physical exam. He denies any blood in the stool or black tarry bowel movements and he is up-to-date on his FOBT checked. He is not having any abdominal pain. He's passing his water well without problems. He has had the back problem which he thinks discomfort from some yard work that he was doing and it may be getting some better and we will continue to watch that. The blood sugars and blood pressures brought in for review will be scanned into the record and the sugars have been running higher with the A1c running at 7.3. We will add Januvia 50 mg 1 daily and he will monitor the blood sugars much more closely.     Patient Active Problem List   Diagnosis Date Noted  . Thrombocytopenia ([place]) 03/18/2015  . Sebaceous cyst 01/23/2015  . Atopic dermatitis 01/23/2015  . Foot lesion 01/16/2015  . Thoracic disc herniation 06/20/2014  . Left varicocele 06/20/2014  . Abdominal aortic atherosclerosis ([place]) 06/20/2014  . Ventral hernia without obstruction or gangrene 02/04/2014  . Personal history of colonic polyps 02/04/2014  . Hyperlipemia 12/30/2012  . Hypertension 12/30/2012  . BPH (benign prostatic hypertrophy)  12/30/2012  . Diabetes type 2, controlled ([place]) 09/28/2012  . RBBB 04/11/2010  . CARDIOVASCULAR FUNCTION STUDY, ABNORMAL 04/11/2010  . HIATAL HERNIA, with GERD 09/14/2007  . NEPHROLITHIASIS 09/14/2007   Outpatient Encounter Prescriptions as of 07/31/2015  Medication Sig  . aspirin 81 MG tablet Take 81 mg by mouth daily.    . cholecalciferol (VITAMIN D) 1000 UNITS tablet Take 2,000 Units by mouth daily.   . Cinnamon 500 MG TABS Take 1,000 tablets by mouth 2 (two) times daily.  Marland Kitchen glucose blood (ONE TOUCH ULTRA TEST) test strip Check BS qid  Dx: E11.9  . Melatonin 5 MG TABS Take 1 tablet by mouth daily.    . metFORMIN (GLUCOPHAGE) 1000 MG tablet Take 1 and 1/2 tablets each morning and 1 tablet each evening.  . mupirocin ointment (BACTROBAN) 2 % Place 1 application into the nose 2 (two) times daily.  Marland Kitchen NEXIUM 40 MG capsule TAKE ONE CAPSULE BY MOUTH ONE TIME DAILY  . ONETOUCH DELICA LANCETS 99991111 MISC   . simvastatin (ZOCOR) 40 MG tablet TAKE ONE TABLET BY MOUTH IN THE EVENING  . triamcinolone ointment (KENALOG) 0.5 % APPLY TOPICALLY 2 (TWO) TIMES DAILY  . valsartan-hydrochlorothiazide (DIOVAN-HCT) 320-25 MG tablet TAKE ONE TABLET BY MOUTH ONE  TIME DAILY  . [DISCONTINUED] azithromycin (ZITHROMAX) 250 MG tablet As directed  . [DISCONTINUED] doxycycline (VIBRA-TABS) 100 MG tablet Take 1 tablet (100 mg total) by mouth 2 (two) times daily. 1 po bid (Patient not taking: Reported  on 02/22/2015)   No facility-administered encounter medications on file as of 07/31/2015.      Review of Systems  Constitutional: Negative.   HENT: Negative.   Eyes: Negative.   Respiratory: Negative.   Cardiovascular: Negative.   Gastrointestinal: Negative.   Endocrine: Negative.   Genitourinary: Negative.   Musculoskeletal: Positive for back pain (with left side pain).  Skin: Negative.   Allergic/Immunologic: Negative.   Neurological: Negative.   Hematological: Negative.   Psychiatric/Behavioral: Negative.         Objective:   Physical Exam  Constitutional: He is oriented to person, place, and time. He appears well-developed and well-nourished. No distress.  HENT:  Head: Normocephalic and atraumatic.  Right Ear: External ear normal.  Left Ear: External ear normal.  Nose: Nose normal.  Mouth/Throat: Oropharynx is clear and moist. No oropharyngeal exudate.  No earwax  Eyes: Conjunctivae and EOM are normal. Pupils are equal, round, and reactive to light. Right eye exhibits no discharge. Left eye exhibits no discharge. No scleral icterus.  The patient needs to get his eye exam  Neck: Normal range of motion. Neck supple. No tracheal deviation present. No thyromegaly present.  No bruits or thyromegaly  Cardiovascular: Normal rate, regular rhythm, normal heart sounds and intact distal pulses.   No murmur heard. Is regular at 72/m  Pulmonary/Chest: Effort normal and breath sounds normal. No respiratory distress. He has no wheezes. He has no rales. He exhibits no tenderness.  Clear anteriorly and posteriorly  Abdominal: Soft. Bowel sounds are normal. He exhibits no mass. There is no tenderness. There is no rebound and no guarding.  No abdominal tenderness masses or organ enlargement palpable  Genitourinary: Rectum normal and penis normal.  The prostate was enlarged but soft and smooth and there are no rectal masses. There are no inguinal hernias. External genitalia were within normal limits.  Musculoskeletal: Normal range of motion. He exhibits no edema.  Lymphadenopathy:    He has no cervical adenopathy.  Neurological: He is alert and oriented to person, place, and time. He has normal reflexes. No cranial nerve deficit.  Skin: Skin is warm and dry. Rash noted. No erythema. No pallor.  The patient has an atopic rash on his left side in his right buttock. This is been an ongoing problem.  Psychiatric: He has a normal mood and affect. His behavior is normal. Judgment and thought content normal.   Nursing note and vitals reviewed.  BP 117/66 mmHg  Pulse 52  Temp(Src) 97.4 F (36.3 C) (Oral)  Ht 5' 8.72" (1.745 m)  Wt 182 lb (82.555 kg)  BMI 27.11 kg/m2        Assessment & Plan:    1. Essential hypertension -The blood pressure is good today the patient will continue current treatment  2. Vitamin D deficiency -The patient will continue his vitamin D replacement as doing  3. Hyperlipemia -All cholesterol numbers were excellent and he will continue with current treatment  4. Controlled type 2 diabetes mellitus without complication, without long-term current use of insulin (HCC) -The hemoglobin A1c was 7.3% and this is higher than it has been in the past and he will add Januvia and we gave him samples for this and will take one half of a 100 mg by mouth once daily and monitor blood sugars closely when he does this. He will bring blood sugars back in to meet with the clinical pharmacists in about 4 weeks for review and will discontinue the Januvia if the blood sugars  run too high. - POCT urinalysis dipstick - POCT UA - Microscopic Only - Microalbumin / creatinine urine ratio  5. Thrombocytopenia ([place]) -Patient has had no symptoms with bleeding.  6. Left varicocele -He is having no problems with this.  7. Diaphragmatic hernia without obstruction and without gangrene -He is not having any problems with reflux currently but would like to try switching to ranitidine from Nexium.  8. BPH (benign prostatic hypertrophy) -The prostate is enlarged but soft and smooth and he is having very few symptoms with this  9. Abdominal aortic atherosclerosis ([place]) -He will continue with his aggressive therapeutic lifestyle changes and current cholesterol treatment  10. Gastroesophageal reflux disease, esophagitis presence not specified -He will try switching over to ranitidine as directed  Patient Instructions                       Medicare Annual Wellness Visit  [facility] and  the medical providers at [facility] strive to bring you the best medical care.  In doing so we not only want to address your current medical conditions and concerns but also to detect new conditions early and prevent illness, disease and health-related problems.    Medicare offers a yearly Wellness Visit which allows our clinical staff to assess your need for preventative services including immunizations, lifestyle education, counseling to decrease risk of preventable diseases and screening for fall risk and other medical concerns.    This visit is provided free of charge (no copay) for all Medicare recipients. The clinical pharmacists at [facility] have begun to conduct these Wellness Visits which will also include a thorough review of all your medications.    As you primary medical provider recommend that you make an appointment for your Annual Wellness Visit if you have not done so already this year.  You may set up this appointment before you leave today or you may call back [phone] and schedule an appointment.  Please make sure when you call that you mention that you are scheduling your Annual Wellness Visit with the clinical pharmacist so that the appointment may be made for the proper length of time.     Continue current medications. Continue good therapeutic lifestyle changes which include good diet and exercise. Fall precautions discussed with patient. If an FOBT was given today- please return it to our front desk. If you are over 32 years old - you may need Prevnar 33 or the adult Pneumonia vaccine.  **Flu shots are available--- please call and schedule a FLU-CLINIC appointment**  After your visit with [place] today you will receive a survey in the mail or online from [employer] regarding your care with [place]. Please take a moment to fill this out. Your feedback is very important to [place] as you can help [place] better understand your patient needs as  well as improve your experience and satisfaction. WE CARE ABOUT YOU!!!   If the patient decides to reduce the Nexium he should reduce this gradually and substitute Zantac or ranitidine over-the-counter 150 mg twice daily before breakfast and supper He may experience some increased heartburn and if he does he may need to take 300 mg of ranitidine twice daily. He should take the Januvia as directed one half pill daily and bring blood sugars in for review in 4 weeks to meet with the clinical pharmacist If the blood sugars run low he should discontinue the Januvia He should continue with the exercise regimen  and dietary regimen As far as the left side pain and back pain is concerned if this is not improved he should come back and get some x-rays of his back. In the meantime use warm wet compresses and Tylenol. We will call with the urinalysis result when that result becomes available Continue to monitor blood sugars regularly   Arrie Senate MD

## 2015-08-04 LAB — HM DIABETES EYE EXAM

## 2015-08-29 ENCOUNTER — Other Ambulatory Visit: Payer: Self-pay | Admitting: Family Medicine

## 2015-08-29 ENCOUNTER — Telehealth: Payer: Self-pay | Admitting: Family Medicine

## 2015-08-29 MED ORDER — OSELTAMIVIR PHOSPHATE 75 MG PO CAPS: 75.0000 mg | ORAL_CAPSULE | Freq: Every day | ORAL | Status: DC

## 2015-08-29 NOTE — Telephone Encounter (Signed)
rx sent to [facility]

## 2015-08-31 ENCOUNTER — Ambulatory Visit (INDEPENDENT_AMBULATORY_CARE_PROVIDER_SITE_OTHER): Payer: Medicare Other | Admitting: Pharmacist

## 2015-08-31 ENCOUNTER — Encounter: Payer: Self-pay | Admitting: Pharmacist

## 2015-08-31 VITALS — BP 128/68 | HR 60 | Ht 69.0 in | Wt 184.0 lb

## 2015-08-31 DIAGNOSIS — E119 Type 2 diabetes mellitus without complications: Secondary | ICD-10-CM | POA: Diagnosis not present

## 2015-08-31 DIAGNOSIS — Z79899 Other long term (current) drug therapy: Secondary | ICD-10-CM

## 2015-08-31 DIAGNOSIS — K219 Gastro-esophageal reflux disease without esophagitis: Secondary | ICD-10-CM

## 2015-08-31 MED ORDER — VALSARTAN-HYDROCHLOROTHIAZIDE 160-12.5 MG PO TABS: 1.0000 | ORAL_TABLET | Freq: Every day | ORAL | Status: DC

## 2015-08-31 MED ORDER — SITAGLIPTIN PHOSPHATE 100 MG PO TABS: 100.0000 mg | ORAL_TABLET | Freq: Every day | ORAL | Status: DC

## 2015-08-31 NOTE — Progress Notes (Signed)
[name], male   DOB: November 04, 1940, 75 y.o.   MRN: [id number]  Subjective:    Joe [name] is a 75 y.o. male who presents for a evaluation of Type 2 diabetes mellitus.  He was started on Januvia 100mg  - take 1/4 tablet daily about 2 months ago.  He is also taking metformin 1000mg  1 tablet BID.   Current symptoms/problems include hyperglycemia and have been improving.  Patient also mentions he would like to taper off Nexium - he tried stopping and switched to zantac a few months ago but was unable to stop.  Known diabetic complications: none Cardiovascular risk factors: advanced age (older than 46 for men, 28 for women), diabetes mellitus, dyslipidemia, hypertension, male gender and obesity (BMI >= 30 kg/m2)   Eye exam current (within one year): yes - Dr Marin Comment 08/2015 Weight trend: stable Prior visit with dietician: no Current diet: in general, a "healthy" diet   Current exercise: goes to [facility] about 3 times per week  Current monitoring regimen: home blood tests - 1 to 2  times daily Home blood sugar records: ranges from 131 to 233 Any episodes of hypoglycemia? no  Is He on ACE inhibitor or angiotensin II receptor blocker?  Yes    valsartan + HCTZ (Diovan HCT) 320/25mg  take 1/4 tablet daily - patient states it is difficult to cut in quarters.    The following portions of the patient's history were reviewed and updated as appropriate: allergies, current medications, past family history, past medical history, past social history, past surgical history and problem list.    Objective:    BP 128/68 mmHg  Pulse 60  Ht 5\' 9"  (1.753 m)  Wt 184 lb (83.462 kg)  BMI 27.16 kg/m2  A1c = 7.3%  Lab Review GLUCOSE (mg/dL)  Date Value  07/28/2015 201*  03/17/2015 121*  09/14/2014 163*   GLUCOSE, BLD (mg/dL)  Date Value  12/24/2012 106*   CO2 (mmol/L)  Date Value  07/28/2015 18  03/17/2015 23  09/14/2014 23   BUN (mg/dL)  Date Value  07/28/2015 17  03/17/2015 15   09/14/2014 19  12/24/2012 20   CREAT (mg/dL)  Date Value  12/24/2012 1.17   CREATININE, SER (mg/dL)  Date Value  07/28/2015 1.19  03/17/2015 1.14  09/14/2014 1.14    Assessment:    Diabetes Mellitus type II, under fair control.  - BG is still elevated in am and at beditime Overweight GERD   Plan:    1.  Rx changes: Increase Januvia to 100mg  1 tablet daily                  Rx for valsartan/HCTZ 180/12.5mg  - patient to take 1/2 tablet daily        Try to chagne Nexium to 1 capsule every other day - may use Pepcid AC or Zantac as needed. 2.  Education: Reviewed 'ABCs' of diabetes management (respective goals in parentheses):  A1C (<7), blood pressure (<130/80), and cholesterol (LDL <100). 3.  Reviewed low CHO diet and discussed low CHO / low calories snacks 4. Follow up: 2 months    Cherre Robins, PharmD, CPP, CDE

## 2015-09-04 ENCOUNTER — Ambulatory Visit: Payer: Medicare Other | Admitting: Family Medicine

## 2015-09-05 ENCOUNTER — Other Ambulatory Visit: Payer: Self-pay | Admitting: *Deleted

## 2015-09-05 NOTE — Telephone Encounter (Signed)
Doxycycline 100 mg #42 1 twice daily with food until completed with 1 refill

## 2015-09-06 MED ORDER — DOXYCYCLINE HYCLATE 100 MG PO TABS: 100.0000 mg | ORAL_TABLET | Freq: Two times a day (BID) | ORAL | Status: DC

## 2015-09-06 NOTE — Telephone Encounter (Signed)
Patient aware, script sent to pharmacy. 

## 2015-10-10 ENCOUNTER — Ambulatory Visit (INDEPENDENT_AMBULATORY_CARE_PROVIDER_SITE_OTHER): Payer: Medicare Other | Admitting: Family Medicine

## 2015-10-10 ENCOUNTER — Encounter: Payer: Self-pay | Admitting: Family Medicine

## 2015-10-10 VITALS — BP 109/67 | HR 64 | Temp 97.0°F | Ht 69.0 in | Wt 180.6 lb

## 2015-10-10 DIAGNOSIS — I809 Phlebitis and thrombophlebitis of unspecified site: Secondary | ICD-10-CM | POA: Diagnosis not present

## 2015-10-10 NOTE — Patient Instructions (Signed)
Great to see you guys!  Try Aspirin full dose (325 mg), 2 pills three times a day  Ice and elevating the leg may be helpful as well.   If you have any concerns please let us know

## 2015-10-10 NOTE — Progress Notes (Signed)
   HPI  Patient presents today here with a painful varicose vein.  Patient explains that for the last 2-3 days he's had a tender area on his posterior right calf where he has varicose vein.  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 He did not have any abrasions or broken skin at that time.  He does not have any leg swelling. He's had stable long-term there remains with no pain.  No fever, chills, sweats.   PMH: Smoking status noted ROS: Per HPI  Objective: BP 109/67 mmHg  Pulse 64  Temp(Src) 97 F (36.1 C) (Oral)  Ht 5\' 9"  (1.753 m)  Wt 180 lb 9.6 oz (81.92 kg)  BMI 26.66 kg/m2 Gen: NAD, alert, cooperative with exam HEENT: NCAT CV: RRR, good S1/S2, no murmur Resp: CTABL, no wheezes, non-labored Ext: No edema, warm Neuro: Alert and oriented, No gross deficits   Varicose veins bilateral ankles and calves, area on the right posterior calf with approximately 3 cm in diameter that's tender to the touch, no firmness, no induration, no erythema, no skin abrasion Area is within large varicose vein   Assessment and plan:  # Superficial thrombophlebitis I suspect early developing superficial thrombophlebitis Possibly mild underlying hematoma given his recent fall Recommended aspirin 6503 times a day, ice, elevation Discussed reasons to be concerned, welcomed them to return to clinic with any concerns, worsening symptoms    Laroy Apple, MD [facility] 10/10/2015, 9:55 AM

## 2015-10-13 ENCOUNTER — Encounter: Payer: Self-pay | Admitting: Family Medicine

## 2015-10-13 ENCOUNTER — Telehealth: Payer: Self-pay | Admitting: Family Medicine

## 2015-10-13 ENCOUNTER — Ambulatory Visit (INDEPENDENT_AMBULATORY_CARE_PROVIDER_SITE_OTHER): Payer: Medicare Other | Admitting: Family Medicine

## 2015-10-13 VITALS — BP 134/66 | HR 60 | Temp 97.0°F | Ht 69.0 in | Wt 183.8 lb

## 2015-10-13 DIAGNOSIS — W57XXXA Bitten or stung by nonvenomous insect and other nonvenomous arthropods, initial encounter: Secondary | ICD-10-CM

## 2015-10-13 DIAGNOSIS — S70921A Unspecified superficial injury of right thigh, initial encounter: Secondary | ICD-10-CM | POA: Diagnosis not present

## 2015-10-13 MED ORDER — DOXYCYCLINE HYCLATE 100 MG PO TABS
100.0000 mg | ORAL_TABLET | Freq: Two times a day (BID) | ORAL | Status: DC
Start: 2015-10-13 — End: 2015-10-31

## 2015-10-13 NOTE — Progress Notes (Signed)
BP 134/66 mmHg  Pulse 60  Temp(Src) 97 F (36.1 C) (Oral)  Ht 5\' 9"  (1.753 m)  Wt 183 lb 12.8 oz (83.371 kg)  BMI 27.13 kg/m2   Subjective:    [name], male    DOB: 1941-01-21, 75 y.o.   MRN: [id number]  HPI: Joe [name] is a 75 y.o. male presenting on 10/13/2015 for Tick bites   HPI Tick bites Patient has to take bites near his right groin on his leg. He was able to remove both takes 3 days ago. He is coming in today because he commonly gets doxycycline because he was high-risk for Lyme disease on last testing. He denies any fevers or chills or rashes anywhere else. He was able to get the full take out.  Relevant past medical, surgical, family and social history reviewed and updated as indicated. Interim medical history since our last visit reviewed. Allergies and medications reviewed and updated.  Review of Systems  Constitutional: Negative for fever.  HENT: Negative for ear discharge and ear pain.   Eyes: Negative for discharge and visual disturbance.  Respiratory: Negative for shortness of breath and wheezing.   Cardiovascular: Negative for chest pain and leg swelling.  Gastrointestinal: Negative for abdominal pain, diarrhea and constipation.  Genitourinary: Negative for difficulty urinating.  Musculoskeletal: Negative for back pain and gait problem.  Skin: Positive for wound. Negative for rash.  Neurological: Negative for syncope, light-headedness and headaches.  All other systems reviewed and are negative.   Per HPI unless specifically indicated above     Medication List       This list is accurate as of: 10/13/15  3:56 PM.  Always use your most recent med list.               aspirin 81 MG tablet  Take 81 mg by mouth daily.     cholecalciferol 1000 units tablet  Commonly known as:  VITAMIN D  Take 2,000 Units by mouth daily.     Cinnamon 500 MG Tabs  Take 1,000 tablets by mouth 2 (two) times daily.     doxycycline 100 MG tablet    Commonly known as:  VIBRA-TABS  Take 1 tablet (100 mg total) by mouth 2 (two) times daily. 1 po bid     glucose blood test strip  Commonly known as:  ONE TOUCH ULTRA TEST  Check BS qid  Dx: E11.9     Melatonin 5 MG Tabs  Take 1 tablet by mouth daily.     metFORMIN 1000 MG tablet  Commonly known as:  GLUCOPHAGE  Take 1 tablet (1,000 mg total) by mouth 2 (two) times daily with a meal.     mupirocin ointment 2 %  Commonly known as:  BACTROBAN  Place 1 application into the nose 2 (two) times daily.     NEXIUM 40 MG capsule  Generic drug:  esomeprazole  TAKE ONE CAPSULE BY MOUTH ONE TIME DAILY     ONETOUCH DELICA LANCETS [id number] Misc     simvastatin 40 MG tablet  Commonly known as:  ZOCOR  TAKE 1 TABLET EVERY EVENING     sitaGLIPtin 100 MG tablet  Commonly known as:  JANUVIA  Take 1 tablet (100 mg total) by mouth daily.     triamcinolone ointment 0.5 %  Commonly known as:  KENALOG  APPLY TOPICALLY 2 (TWO) TIMES DAILY     valsartan-hydrochlorothiazide 160-12.5 MG tablet  Commonly known as:  DIOVAN-HCT  Take  1 tablet by mouth daily.           Objective:    BP 134/66 mmHg  Pulse 60  Temp(Src) 97 F (36.1 C) (Oral)  Ht 5\' 9"  (1.753 m)  Wt 183 lb 12.8 oz (83.371 kg)  BMI 27.13 kg/m2  Wt Readings from Last 3 Encounters:  10/13/15 183 lb 12.8 oz (83.371 kg)  10/10/15 180 lb 9.6 oz (81.92 kg)  08/31/15 184 lb (83.462 kg)    Physical Exam  Constitutional: He is oriented to person, place, and time. He appears well-developed and well-nourished. No distress.  Eyes: Conjunctivae and EOM are normal. Pupils are equal, round, and reactive to light. Right eye exhibits no discharge. No scleral icterus.  Cardiovascular: Normal rate, regular rhythm, normal heart sounds and intact distal pulses.   No murmur heard. Pulmonary/Chest: Effort normal and breath sounds normal. No respiratory distress. He has no wheezes.  Musculoskeletal: Normal range of motion. He exhibits no edema.   Neurological: He is alert and oriented to person, place, and time. Coordination normal.  Skin: Skin is warm and dry. Rash noted. Rash is papular (2 small papules where the second had eschar from tick bites in right groin.). He is not diaphoretic.  Psychiatric: He has a normal mood and affect. His behavior is normal.  Vitals reviewed.     Assessment & Plan:       Problem List Items Addressed This Visit    None    Visit Diagnoses    Tick bite    -  Primary    Relevant Medications    doxycycline (VIBRA-TABS) 100 MG tablet        Follow up plan: Return if symptoms worsen or fail to improve.  Counseling provided for all of the vaccine components No orders of the defined types were placed in this encounter.    Caryl Pina, MD [facility] 10/13/2015, 3:56 PM

## 2015-10-13 NOTE — Telephone Encounter (Signed)
Appt made to see Dr. Warrick Parisian at 2:55 on 5/12 for tick bites

## 2015-10-21 ENCOUNTER — Other Ambulatory Visit: Payer: Self-pay | Admitting: Family Medicine

## 2015-10-31 ENCOUNTER — Other Ambulatory Visit: Payer: Self-pay | Admitting: *Deleted

## 2015-10-31 DIAGNOSIS — W57XXXA Bitten or stung by nonvenomous insect and other nonvenomous arthropods, initial encounter: Secondary | ICD-10-CM

## 2015-10-31 MED ORDER — DOXYCYCLINE HYCLATE 100 MG PO TABS: 100.0000 mg | ORAL_TABLET | Freq: Two times a day (BID) | ORAL | Status: DC

## 2015-10-31 NOTE — Telephone Encounter (Signed)
Pt had about 12 tick bites

## 2015-11-01 ENCOUNTER — Ambulatory Visit (INDEPENDENT_AMBULATORY_CARE_PROVIDER_SITE_OTHER): Payer: Medicare Other | Admitting: Pharmacist

## 2015-11-01 ENCOUNTER — Encounter: Payer: Self-pay | Admitting: Pharmacist

## 2015-11-01 VITALS — BP 132/70 | HR 70 | Ht 69.0 in | Wt 179.0 lb

## 2015-11-01 DIAGNOSIS — W57XXXA Bitten or stung by nonvenomous insect and other nonvenomous arthropods, initial encounter: Secondary | ICD-10-CM

## 2015-11-01 DIAGNOSIS — E119 Type 2 diabetes mellitus without complications: Secondary | ICD-10-CM | POA: Diagnosis not present

## 2015-11-01 DIAGNOSIS — Z0000 Encounter for general adult medical examination without abnormal findings: Secondary | ICD-10-CM

## 2015-11-01 DIAGNOSIS — T148 Other injury of unspecified body region: Secondary | ICD-10-CM | POA: Diagnosis not present

## 2015-11-01 LAB — BAYER DCA HB A1C WAIVED: HB A1C (BAYER DCA - WAIVED): 6.7 % (ref <7.0)

## 2015-11-01 NOTE — Progress Notes (Signed)
[name], male   DOB: [birth year], 75 y.o.   MRN: [id number]    Subjective:   Joe [name] is a 75 y.o. male who presents for an Initial Medicare Annual Wellness Visit and to recheck A1c / diabetes  Checking BG BID Home BG readings:  AM - 129 to 207  PM - 112 - 350 Review of Systems  Review of Systems  Constitutional: Negative.   HENT: Negative.   Eyes: Negative.   Respiratory: Negative.   Cardiovascular: Negative.   Gastrointestinal: Negative.   Genitourinary: Negative.   Musculoskeletal: Positive for joint pain.  Skin: Negative.   Neurological: Negative.   Endo/Heme/Allergies: Negative.   Psychiatric/Behavioral: The patient has insomnia (melatonin helps).        Current Medications (verified) Outpatient Encounter Prescriptions as of 11/01/2015  Medication Sig  . aspirin 81 MG tablet Take 81 mg by mouth daily.    . cholecalciferol (VITAMIN D) 1000 UNITS tablet Take 2,000 Units by mouth daily.   . Cinnamon 500 MG TABS Take 1,000 tablets by mouth 2 (two) times daily.  Marland Kitchen doxycycline (VIBRA-TABS) 100 MG tablet Take 1 tablet (100 mg total) by mouth 2 (two) times daily. 1 po bid  . doxylamine, Sleep, (UNISOM) 25 MG tablet Take 25 mg by mouth at bedtime as needed.  Marland Kitchen esomeprazole (NEXIUM) 40 MG capsule TAKE (1) CAPSULE DAILY  . glucose blood (ONE TOUCH ULTRA TEST) test strip Check BS qid  Dx: E11.9  . Melatonin 5 MG TABS Take 1 tablet by mouth daily.    . metFORMIN (GLUCOPHAGE) 1000 MG tablet Take 1 tablet (1,000 mg total) by mouth 2 (two) times daily with a meal.  . ONETOUCH DELICA LANCETS [id number] MISC   . simvastatin (ZOCOR) 40 MG tablet TAKE 1 TABLET EVERY EVENING  . sitaGLIPtin (JANUVIA) 100 MG tablet Take 1 tablet (100 mg total) by mouth daily.  . valsartan-hydrochlorothiazide (DIOVAN-HCT) 160-12.5 MG tablet Take 1 tablet by mouth daily.  . [DISCONTINUED] doxycycline (VIBRA-TABS) 100 MG tablet Take 1 tablet (100 mg total) by mouth 2 (two) times daily. 1 po bid    . [DISCONTINUED] metFORMIN (GLUCOPHAGE) 1000 MG tablet Take 1 and 1/2 tablets each morning and 1 tablet each evening. (Patient not taking: Reported on 11/01/2015)  . [DISCONTINUED] mupirocin ointment (BACTROBAN) 2 % Place 1 application into the nose 2 (two) times daily. (Patient not taking: Reported on 11/01/2015)  . [DISCONTINUED] NEXIUM 40 MG capsule TAKE ONE CAPSULE BY MOUTH ONE TIME DAILY  . [DISCONTINUED] triamcinolone ointment (KENALOG) 0.5 % APPLY TOPICALLY 2 (TWO) TIMES DAILY (Patient not taking: Reported on 11/01/2015)   No facility-administered encounter medications on file as of 11/01/2015.    Allergies (verified) Ace inhibitors and Trazamine   History: Past Medical History  Diagnosis Date  . Nephrolithiasis   . GERD (gastroesophageal reflux disease)   . Dyslipidemia   . Tubulovillous adenoma of colon   . Hiatal hernia   . Esophagitis, reflux   . ESOPHAGITIS, REFLUX 07/09/2004    Qualifier: Diagnosis of  By: Hardin Negus CMA (AAMA), [place]    . Diabetes mellitus without complication ([place])   . Arthritis   . Colon polyps   . Hypertension   . Irritable bowel syndrome   . Kidney stones 1970  . Pneumonia   . Cataract   . Hyperlipidemia   . Tick bites     took 5 rounds of Doxycycline this summer  . HOH (hard of hearing)     has bilateral  Hearing aids  . Kidney stone 1970   Past Surgical History  Procedure Laterality Date  . Tonsillectomy    . Laparoscopic colon resection  08/2005  . Hemicolectomy  08/2005    Right  . Esophageal dilation  1996  . Esophageal dilation  2003  . Flexible sigmoidoscopy    . Colonoscopy    . Knee surgery Right    Family History  Problem Relation Age of Onset  . Stroke Father 12  . Hyperlipidemia Father   . Hypertension Father   . Heart disease Brother   . Cancer Brother     lung   . Hyperlipidemia Brother   . Hypertension Brother   . Stroke Brother   . Diabetes Brother   . Breast cancer Mother   . Colon cancer Neg Hx   . Colon  polyps Neg Hx   . Kidney disease Neg Hx   . Esophageal cancer Neg Hx   . Stomach cancer Neg Hx   . Rectal cancer Neg Hx    Social History   Occupational History  . Retired Pharmacist, hospital    Social History Main Topics  . Smoking status: Former Smoker -- 2.00 packs/day    Types: Cigarettes    Start date: 06/03/1954    Quit date: 08/24/1984  . Smokeless tobacco: Never Used     Comment: Has not used to tobacco products for the past 20 years  . Alcohol Use: 2.5 oz/week    5 Standard drinks or equivalent per week     Comment: Occassionally  . Drug Use: No  . Sexual Activity:    Partners: Female    Do you feel safe at home?  Yes Are there smokers in your home (other than you)? No  Dietary issues and exercise activities discussed: Current Exercise Habits: Structured exercise class, Type of exercise: strength training/weights;walking, Time (Minutes): 60, Frequency (Times/Week): 3, Weekly Exercise (Minutes/Week): 180, Intensity: Moderate  Current Dietary habits:    Breakfast - usually high fiber cereal  Lunch - sometimes skip, sandwich, vienna sausages  Supper - eats out several times per month.    Grilled hamburgers, french fries (a few times per week)   Chicken salad with grapes and mayo   Meat and vegetables  Snack - 2 or 3 sugar free cookies;    Cardiac Risk Factors include: advanced age (>68men, >57 women);diabetes mellitus;dyslipidemia;family history of premature cardiovascular disease;hypertension;male gender  Objective:    Today's Vitals   11/01/15 1201  BP: 132/70  Pulse: 70  Height: 5\' 9"  (1.753 m)  Weight: 179 lb (81.194 kg)  PainSc: 0-No pain   Body mass index is 26.42 kg/(m^2).  A1c = 6.7% (11/01/2015)   Activities of Daily Living In your present state of health, do you have any difficulty performing the following activities: 11/01/2015  Hearing? Y  Vision? N  Difficulty concentrating or making decisions? N  Walking or climbing stairs? N  Dressing or  bathing? N  Doing errands, shopping? N  Preparing Food and eating ? N  Using the Toilet? N  In the past six months, have you accidently leaked urine? N  Do you have problems with loss of bowel control? N  Managing your Medications? N  Managing your Finances? N  Housekeeping or managing your Housekeeping? N     Depression Screen PHQ 2/9 Scores 11/01/2015 10/13/2015 10/10/2015 07/31/2015  PHQ - 2 Score 0 0 0 1  PHQ- 9 Score - - - -     Fall Risk Fall  Risk  11/01/2015 10/13/2015 10/10/2015 07/31/2015 03/17/2015  Falls in the past year? No No No No Yes  Number falls in past yr: - - - - 1  Injury with Fall? - - - - No    Cognitive Function: No flowsheet data found.  Immunizations and Health Maintenance Immunization History  Administered Date(s) Administered  . Influenza,inj,Quad PF,36+ Mos 03/23/2013, 03/26/2014, 03/17/2015  . Pneumococcal Conjugate-13 06/08/2013  . Pneumococcal Polysaccharide-23 04/03/2008   Health Maintenance Due  Topic Date Due  . OPHTHALMOLOGY EXAM  08/05/2015    Patient Care Team: Chipper Herb, MD as PCP - General (Family Medicine) Irine Seal, MD (Urology) Gatha Mayer, MD (Gastroenterology)  Indicate any recent Medical Services you may have received from other than [facility] providers in the past year (date may be approximate).    Assessment:    Annual Wellness Visit  Type 2 DM - A1c at goal and improved since starting Januvia   Screening Tests Health Maintenance  Topic Date Due  . OPHTHALMOLOGY EXAM  08/05/2015  . INFLUENZA VACCINE  01/02/2016  . HEMOGLOBIN A1C  01/25/2016  . COLON CANCER SCREENING ANNUAL FOBT  04/23/2016  . FOOT EXAM  07/30/2016  . COLONOSCOPY  04/07/2019  . TETANUS/TDAP  03/03/2021  . ZOSTAVAX  Completed  . PNA vac Low Risk Adult  Completed        Plan:   During the course of the visit Sirr was educated and counseled about the following appropriate screening and preventive services:   Vaccines to include Pneumoccal,  Influenza,  Td, Zostavax - UTD  Colorectal cancer screening - UTD  Cardiovascular disease screening - no recent EKG -consider at next PCP visit  Diabetes - continue current medications  Glaucoma screening / Diabetic Eye Exam - UTD, need to request visit from Dr Marin Comment  Nutrition counseling - continue to limit CHO intake.  Increase fruit and vegetable intake.  Limit high fat meats  Advanced Directives - UTD  Physical Activity - continue to exercised at least 150 minutes weekly  Orders Placed This Encounter  Procedures  . Rocky mtn spotted fvr abs pnl(IgG+IgM) - per Dr Tawanna Sat request  . Lyme Ab/Western Blot Reflex - per Dr Tawanna Sat request  . Bayer DCA Hb A1c Waived  . Microalbumin / creatinine urine ratio       Patient Instructions (the written plan) were given to the patient.   Cherre Robins, [facility]   11/01/2015

## 2015-11-01 NOTE — Patient Instructions (Addendum)
Joe [name] , Thank you for taking time to come for your Medicare Wellness Visit. I appreciate your ongoing commitment to your health goals. Please review the following plan we discussed and let me know if I can assist you in the future.   These are the goals we discussed:  Recommend continue exercising three times per week - goal is 150    Increase non-starchy vegetables - carrots, green bean, squash, zucchini, tomatoes, onions, peppers, spinach and other green leafy vegetables, cabbage, lettuce, cucumbers, asparagus, okra (not fried), eggplant Limit sugar and processed foods (cakes, cookies, ice cream, crackers and chips) Increase fresh fruit but limit serving sizes 1/2 cup or about the size of tennis or baseball Limit red meat to no more than 1-2 times per week (serving size about the size of your palm) Choose whole grains / lean proteins - whole wheat bread, quinoa, whole grain rice (1/2 cup), fish, chicken, [place] Avoid sugar and calorie containing beverages - soda, sweet tea and juice.  Choose water or unsweetened tea instead.  This is a list of the screening recommended for you and due dates:  Health Maintenance  Topic Date Due  . Eye exam for diabetics  08/05/2015 - will request office visit from Dr Marin Comment.   Marland Kitchen Flu Shot  01/02/2016  . Hemoglobin A1C  01/25/2016  . Stool Blood Test  04/23/2016  . Complete foot exam   07/30/2016  . Colon Cancer Screening  04/07/2019  . Tetanus Vaccine  03/03/2021  . Shingles Vaccine  Completed  . Pneumonia vaccines  Completed   Health Maintenance, Male A healthy lifestyle and preventative care can promote health and wellness.  Maintain regular health, dental, and eye exams.  Eat a healthy diet. Foods like vegetables, fruits, whole grains, low-fat dairy products, and lean protein foods contain the nutrients you need and are low in calories. Decrease your intake of foods high in solid fats, added sugars, and salt. Get information about a proper diet  from your health care provider, if necessary.  Regular physical exercise is one of the most important things you can do for your health. Most adults should get at least 150 minutes of moderate-intensity exercise (any activity that increases your heart rate and causes you to sweat) each week. In addition, most adults need muscle-strengthening exercises on 2 or more days a week.   Maintain a healthy weight. The body mass index (BMI) is a screening tool to identify possible weight problems. It provides an estimate of body fat based on height and weight. Your health care provider can find your BMI and can help you achieve or maintain a healthy weight. For males 20 years and older:  A BMI below 18.5 is considered underweight.  A BMI of 18.5 to 24.9 is normal.  A BMI of 25 to 29.9 is considered overweight.  A BMI of 30 and above is considered obese.  Maintain normal blood lipids and cholesterol by exercising and minimizing your intake of saturated fat. Eat a balanced diet with plenty of fruits and vegetables. Blood tests for lipids and cholesterol should begin at age 5 and be repeated every 5 years. If your lipid or cholesterol levels are high, you are over age 14, or you are at high risk for heart disease, you may need your cholesterol levels checked more frequently.Ongoing high lipid and cholesterol levels should be treated with medicines if diet and exercise are not working.  If you smoke, find out from your health care provider how to  quit. If you do not use tobacco, do not start.  Lung cancer screening is recommended for adults aged 80-80 years who are at high risk for developing lung cancer because of a history of smoking. A yearly low-dose CT scan of the lungs is recommended for people who have at least a 30-pack-year history of smoking and are current smokers or have quit within the past 15 years. A pack year of smoking is smoking an average of 1 pack of cigarettes a day for 1 year (for  example, a 30-pack-year history of smoking could mean smoking 1 pack a day for 30 years or 2 packs a day for 15 years). Yearly screening should continue until the smoker has stopped smoking for at least 15 years. Yearly screening should be stopped for people who develop a health problem that would prevent them from having lung cancer treatment.  If you choose to drink alcohol, do not have more than 2 drinks per day. One drink is considered to be 12 oz (360 mL) of beer, 5 oz (150 mL) of wine, or 1.5 oz (45 mL) of liquor.  Avoid the use of street drugs. Do not share needles with anyone. Ask for help if you need support or instructions about stopping the use of drugs.  High blood pressure causes heart disease and increases the risk of stroke. High blood pressure is more likely to develop in:  People who have blood pressure in the end of the normal range (100-139/85-89 mm Hg).  People who are overweight or obese.  People who are African American.  If you are 62-49 years of age, have your blood pressure checked every 3-5 years. If you are 40 years of age or older, have your blood pressure checked every year. You should have your blood pressure measured twice--once when you are at a hospital or clinic, and once when you are not at a hospital or clinic. Record the average of the two measurements. To check your blood pressure when you are not at a hospital or clinic, you can use:  An automated blood pressure machine at a pharmacy.  A home blood pressure monitor.  If you are 54-54 years old, ask your health care provider if you should take aspirin to prevent heart disease.  Diabetes screening involves taking a blood sample to check your fasting blood sugar level. This should be done once every 3 years after age 70 if you are at a normal weight and without risk factors for diabetes. Testing should be considered at a younger age or be carried out more frequently if you are overweight and have at least 1  risk factor for diabetes.  Colorectal cancer can be detected and often prevented. Most routine colorectal cancer screening begins at the age of 27 and continues through age 22. However, your health care provider may recommend screening at an earlier age if you have risk factors for colon cancer. On a yearly basis, your health care provider may provide home test kits to check for hidden blood in the stool. A small camera at the end of a tube may be used to directly examine the colon (sigmoidoscopy or colonoscopy) to detect the earliest forms of colorectal cancer. Talk to your health care provider about this at age 67 when routine screening begins. A direct exam of the colon should be repeated every 5-10 years through age 82, unless early forms of precancerous polyps or small growths are found.  People who are at an increased risk for  hepatitis B should be screened for this virus. You are considered at high risk for hepatitis B if:  You were born in a country where hepatitis B occurs often. Talk with your health care provider about which countries are considered high risk.  Your parents were born in a high-risk country and you have not received a shot to protect against hepatitis B (hepatitis B vaccine).  You have HIV or AIDS.  You use needles to inject street drugs.  You live with, or have sex with, someone who has hepatitis B.  You are a man who has sex with other men (MSM).  You get hemodialysis treatment.  You take certain medicines for conditions like cancer, organ transplantation, and autoimmune conditions.  Hepatitis C blood testing is recommended for all people born from [birth year] through 1965 and any individual with known risk factors for hepatitis C.  Healthy men should no longer receive prostate-specific antigen (PSA) blood tests as part of routine cancer screening. Talk to your health care provider about prostate cancer screening.  Testicular cancer screening is not recommended for  adolescents or adult males who have no symptoms. Screening includes self-exam, a health care provider exam, and other screening tests. Consult with your health care provider about any symptoms you have or any concerns you have about testicular cancer.  Practice safe sex. Use condoms and avoid high-risk sexual practices to reduce the spread of sexually transmitted infections (STIs).  You should be screened for STIs, including gonorrhea and chlamydia if:  You are sexually active and are younger than 24 years.  You are older than 24 years, and your health care provider tells you that you are at risk for this type of infection.  Your sexual activity has changed since you were last screened, and you are at an increased risk for chlamydia or gonorrhea. Ask your health care provider if you are at risk.  If you are at risk of being infected with HIV, it is recommended that you take a prescription medicine daily to prevent HIV infection. This is called pre-exposure prophylaxis (PrEP). You are considered at risk if:  You are a man who has sex with other men (MSM).  You are a heterosexual man who is sexually active with multiple partners.  You take drugs by injection.  You are sexually active with a partner who has HIV.  Talk with your health care provider about whether you are at high risk of being infected with HIV. If you choose to begin PrEP, you should first be tested for HIV. You should then be tested every 3 months for as long as you are taking PrEP.  Use sunscreen. Apply sunscreen liberally and repeatedly throughout the day. You should seek shade when your shadow is shorter than you. Protect yourself by wearing long sleeves, pants, a wide-brimmed hat, and sunglasses year round whenever you are outdoors.  Tell your health care provider of new moles or changes in moles, especially if there is a change in shape or color. Also, tell your health care provider if a mole is larger than the size of a  pencil eraser.  A one-time screening for abdominal aortic aneurysm (AAA) and surgical repair of large AAAs by ultrasound is recommended for men aged 28-75 years who are current or former smokers.  Stay current with your vaccines (immunizations).   This information is not intended to replace advice given to you by your health care provider. Make sure you discuss any questions you have with your health  care provider.   Document Released: 11/16/2007 Document Revised: 06/10/2014 Document Reviewed: 10/15/2010 Elsevier Interactive Patient Education [employer].

## 2015-11-02 LAB — MICROALBUMIN / CREATININE URINE RATIO
CREATININE, UR: 111.8 mg/dL
MICROALB/CREAT RATIO: 9 mg/g creat (ref 0.0–30.0)
Microalbumin, Urine: 10.1 ug/mL

## 2015-11-03 LAB — LYME AB/WESTERN BLOT REFLEX: Lyme IgG/IgM Ab: <0.91 {ISR} (ref 0.00–0.90)

## 2015-11-03 LAB — ROCKY MTN SPOTTED FVR ABS PNL(IGG+IGM)
RMSF IGM: 0.63 {index} (ref 0.00–0.89)
RMSF IgG: POSITIVE — AB

## 2015-11-03 LAB — RMSF, IGG, IFA: RMSF, IGG, IFA: 1:256 {titer} — ABNORMAL HIGH

## 2015-11-04 ENCOUNTER — Other Ambulatory Visit: Payer: Self-pay | Admitting: Pharmacist

## 2015-11-04 ENCOUNTER — Telehealth: Payer: Self-pay | Admitting: Family Medicine

## 2015-11-04 MED ORDER — SITAGLIPTIN PHOSPHATE 100 MG PO TABS: 100.0000 mg | ORAL_TABLET | Freq: Every day | ORAL | Status: DC

## 2015-11-06 NOTE — Telephone Encounter (Signed)
Patient aware.

## 2015-11-06 NOTE — Telephone Encounter (Signed)
The tests for Lyme disease was negative still waiting on a final interpretation on the [facility] spotted fever. He should continue and take the antibiotic that he is been on.

## 2015-12-04 ENCOUNTER — Encounter: Payer: Self-pay | Admitting: Family Medicine

## 2015-12-04 ENCOUNTER — Ambulatory Visit (INDEPENDENT_AMBULATORY_CARE_PROVIDER_SITE_OTHER): Payer: Medicare Other | Admitting: Family Medicine

## 2015-12-04 VITALS — BP 117/60 | HR 63 | Temp 97.5°F | Ht 69.0 in | Wt 181.0 lb

## 2015-12-04 DIAGNOSIS — R21 Rash and other nonspecific skin eruption: Secondary | ICD-10-CM

## 2015-12-04 DIAGNOSIS — I861 Scrotal varices: Secondary | ICD-10-CM

## 2015-12-04 DIAGNOSIS — K219 Gastro-esophageal reflux disease without esophagitis: Secondary | ICD-10-CM

## 2015-12-04 DIAGNOSIS — E119 Type 2 diabetes mellitus without complications: Secondary | ICD-10-CM | POA: Diagnosis not present

## 2015-12-04 DIAGNOSIS — R197 Diarrhea, unspecified: Secondary | ICD-10-CM | POA: Diagnosis not present

## 2015-12-04 DIAGNOSIS — E1122 Type 2 diabetes mellitus with diabetic chronic kidney disease: Secondary | ICD-10-CM | POA: Diagnosis not present

## 2015-12-04 DIAGNOSIS — I700 Atherosclerosis of aorta: Secondary | ICD-10-CM | POA: Diagnosis not present

## 2015-12-04 DIAGNOSIS — N400 Enlarged prostate without lower urinary tract symptoms: Secondary | ICD-10-CM | POA: Diagnosis not present

## 2015-12-04 DIAGNOSIS — R195 Other fecal abnormalities: Secondary | ICD-10-CM

## 2015-12-04 DIAGNOSIS — N182 Chronic kidney disease, stage 2 (mild): Secondary | ICD-10-CM

## 2015-12-04 DIAGNOSIS — E785 Hyperlipidemia, unspecified: Secondary | ICD-10-CM | POA: Diagnosis not present

## 2015-12-04 DIAGNOSIS — E559 Vitamin D deficiency, unspecified: Secondary | ICD-10-CM | POA: Diagnosis not present

## 2015-12-04 DIAGNOSIS — D696 Thrombocytopenia, unspecified: Secondary | ICD-10-CM

## 2015-12-04 DIAGNOSIS — I10 Essential (primary) hypertension: Secondary | ICD-10-CM

## 2015-12-04 LAB — URINALYSIS, COMPLETE
Bilirubin, UA: NEGATIVE
LEUKOCYTES UA: NEGATIVE
Nitrite, UA: NEGATIVE
PH UA: 5 (ref 5.0–7.5)
PROTEIN UA: NEGATIVE
RBC UA: NEGATIVE
Specific Gravity, UA: 1.025 (ref 1.005–1.030)
Urobilinogen, Ur: 0.2 mg/dL (ref 0.2–1.0)

## 2015-12-04 LAB — MICROSCOPIC EXAMINATION: Bacteria, UA: NONE SEEN

## 2015-12-04 NOTE — Patient Instructions (Addendum)
Medicare Annual Wellness Visit  [facility] and the medical providers at [facility] strive to bring you the best medical care.  In doing so we not only want to address your current medical conditions and concerns but also to detect new conditions early and prevent illness, disease and health-related problems.    Medicare offers a yearly Wellness Visit which allows our clinical staff to assess your need for preventative services including immunizations, lifestyle education, counseling to decrease risk of preventable diseases and screening for fall risk and other medical concerns.    This visit is provided free of charge (no copay) for all Medicare recipients. The clinical pharmacists at [facility] have begun to conduct these Wellness Visits which will also include a thorough review of all your medications.    As you primary medical provider recommend that you make an appointment for your Annual Wellness Visit if you have not done so already this year.  You may set up this appointment before you leave today or you may call back [phone]) and schedule an appointment.  Please make sure when you call that you mention that you are scheduling your Annual Wellness Visit with the clinical pharmacist so that the appointment may be made for the proper length of time.     Continue current medications. Continue good therapeutic lifestyle changes which include good diet and exercise. Fall precautions discussed with patient. If an FOBT was given today- please return it to our front desk. If you are over 11 years old - you may need Prevnar 53 or the adult Pneumonia vaccine.  **Flu shots are available--- please call and schedule a FLU-CLINIC appointment**  After your visit with [place] today you will receive a survey in the mail or online from [employer] regarding your care with [place]. Please take a moment to fill this out. Your feedback is very  important to [place] as you can help [place] better understand your patient needs as well as improve your experience and satisfaction. WE CARE ABOUT YOU!!!   Try to avoid taking any more antibiotics and protect yourself well from tick bites Since his been a while since she is seen the cardiologist we will arrange for a follow-up visit with him just because you have a history of diabetes and a family history of heart disease. Continue to monitor blood sugars closely and get as much exercise as possible

## 2015-12-04 NOTE — Addendum Note (Signed)
Addended by: Zannie Cove on: 12/04/2015 10:46 AM   Modules accepted: Orders

## 2015-12-04 NOTE — Progress Notes (Signed)
Subjective:    [name], male    DOB: 1941-03-14, 75 y.o.   MRN: [id number]  HPI Pt here for follow up and management of chronic medical problems which includes diabetes and hypertension. The patient is doing well today with no specific complaints. He had a hemoglobin A1c in May that was 6.7 and this was the end of May. He brings in outside blood sugars for review. The morning blood sugars appear to be averaging in the 140-160 range in the evening blood sugars at bedtime around 190. The patient is due to get lab work but will return fasting for this. He'll also get an EKG urinalysis today. The patient is doing well with no specific complaints. He does have a rash on his buttocks is concerned about. He denies any chest pain shortness of breath trouble swallowing heartburn indigestion nausea vomiting diarrhea or blood in the stool. He has taken several rounds of antibiotics for tick bites and does have one loose bowel movement daily. He is passing his water without problems. Is up-to-date on his eye exam. The family history is positive and his parents for stroke heart disease and breast cancer. He has a brother that had diabetes and lung cancer.      Patient Active Problem List   Diagnosis Date Noted  . Thrombocytopenia ([place]) 03/18/2015  . Sebaceous cyst 01/23/2015  . Atopic dermatitis 01/23/2015  . Foot lesion 01/16/2015  . Thoracic disc herniation 06/20/2014  . Left varicocele 06/20/2014  . Abdominal aortic atherosclerosis ([place]) 06/20/2014  . Ventral hernia without obstruction or gangrene 02/04/2014  . Personal history of colonic polyps 02/04/2014  . Hyperlipemia 12/30/2012  . Hypertension 12/30/2012  . BPH (benign prostatic hypertrophy) 12/30/2012  . Diabetes type 2, controlled ([place]) 09/28/2012  . RBBB 04/11/2010  . CARDIOVASCULAR FUNCTION STUDY, ABNORMAL 04/11/2010  . Diaphragmatic hernia 09/14/2007  . NEPHROLITHIASIS 09/14/2007   Outpatient Encounter  Prescriptions as of 12/04/2015  Medication Sig  . aspirin 81 MG tablet Take 81 mg by mouth daily.    . cholecalciferol (VITAMIN D) 1000 UNITS tablet Take 2,000 Units by mouth daily.   . Cinnamon 500 MG TABS Take 1,000 tablets by mouth 2 (two) times daily.  Marland Kitchen doxycycline (VIBRA-TABS) 100 MG tablet Take 1 tablet (100 mg total) by mouth 2 (two) times daily. 1 po bid  . doxylamine, Sleep, (UNISOM) 25 MG tablet Take 25 mg by mouth at bedtime as needed.  Marland Kitchen esomeprazole (NEXIUM) 40 MG capsule TAKE (1) CAPSULE DAILY  . glucose blood (ONE TOUCH ULTRA TEST) test strip Check BS qid  Dx: E11.9  . Melatonin 5 MG TABS Take 1 tablet by mouth daily.    . metFORMIN (GLUCOPHAGE) 1000 MG tablet Take 1 tablet (1,000 mg total) by mouth 2 (two) times daily with a meal.  . ONETOUCH DELICA LANCETS 33A MISC   . simvastatin (ZOCOR) 40 MG tablet TAKE 1 TABLET EVERY EVENING  . sitaGLIPtin (JANUVIA) 100 MG tablet Take 1 tablet (100 mg total) by mouth daily.  . valsartan-hydrochlorothiazide (DIOVAN-HCT) 160-12.5 MG tablet Take 1 tablet by mouth daily.   No facility-administered encounter medications on file as of 12/04/2015.      Review of Systems  Constitutional: Negative.   HENT: Negative.   Eyes: Negative.   Respiratory: Negative.   Cardiovascular: Negative.   Gastrointestinal: Negative.   Endocrine: Negative.   Genitourinary: Negative.   Musculoskeletal: Negative.   Skin: Negative.   Allergic/Immunologic: Negative.   Neurological: Negative.  Hematological: Negative.   Psychiatric/Behavioral: Negative.        Objective:   Physical Exam  Constitutional: He is oriented to person, place, and time. He appears well-developed and well-nourished. No distress.  HENT:  Head: Normocephalic and atraumatic.  Right Ear: External ear normal.  Left Ear: External ear normal.  Nose: Nose normal.  Mouth/Throat: Oropharynx is clear and moist. No oropharyngeal exudate.  Eyes: Conjunctivae and EOM are normal. Pupils are  equal, round, and reactive to light. Right eye exhibits no discharge. Left eye exhibits no discharge. No scleral icterus.  The patient is up-to-date on his eye exams.  Neck: Normal range of motion. Neck supple. No thyromegaly present.  Cardiovascular: Normal rate, regular rhythm, normal heart sounds and intact distal pulses.   No murmur heard. Heart has a regular rate and rhythm at 60/m  Pulmonary/Chest: Effort normal and breath sounds normal. No respiratory distress. He has no wheezes. He has no rales. He exhibits no tenderness.  Clear anteriorly and posteriorly  Abdominal: Soft. Bowel sounds are normal. He exhibits no mass. There is tenderness. There is no rebound and no guarding.  Slight epigastric tenderness without masses or organ enlargement or bruits  Genitourinary: Rectum normal and penis normal.  The prostate is enlarged but soft and smooth. There are no rectal masses. There are no inguinal hernias palpable. External genitalia were within normal limits.  Musculoskeletal: Normal range of motion. He exhibits no edema or tenderness.  Lymphadenopathy:    He has no cervical adenopathy.  Neurological: He is alert and oriented to person, place, and time. He has normal reflexes. No cranial nerve deficit.  Skin: Skin is warm and dry. No rash noted.  Atopic dermatitis both posterior buttocks  Psychiatric: He has a normal mood and affect. His behavior is normal. Judgment and thought content normal.  Nursing note and vitals reviewed.  BP 117/60 mmHg  Pulse 63  Temp(Src) 97.5 F (36.4 C) (Oral)  Ht _0  (1.753 m)  Wt 181 lb (82.101 kg)  BMI 26.72 kg/m2  EKG: Interventricular conduction delay       Assessment & Plan:  1. Type 2 diabetes mellitus without complication, without long-term current use of insulin ([place]) -The last hemoglobin A1c in late May and early June was 6.7%. The patient appears to be doing better with diet control and with current medicines. - CBC with  Differential/Platelet; Future  2. Gastroesophageal reflux disease, esophagitis presence not specified -He continues to watch his diet to control his reflux symptoms. - CBC with Differential/Platelet; Future  3. Essential hypertension -Blood pressure is good today and he will continue with current treatment - EKG 12-Lead - BMP8+EGFR; Future - CBC with Differential/Platelet; Future - Hepatic function panel; Future - NMR, lipoprofile; Future  4. Vitamin D deficiency -Continue with current treatment pending results of lab work - CBC with Differential/Platelet; Future - VITAMIN D 25 Hydroxy (Vit-D Deficiency, Fractures); Future  5. Hyperlipemia -Continue aggressive therapeutic lifestyle changes and current treatment for cholesterol pending results of lab work - CBC with Differential/Platelet; Future - NMR, lipoprofile; Future  6. BPH (benign prostatic hypertrophy) -The prostate is enlarged but soft and smooth with no irregularities being palpable. There are no rectal masses either. Patient is having no trouble passing his water. - Urinalysis, Complete - CBC with Differential/Platelet; Future - PSA, total and free; Future  7. Abdominal aortic atherosclerosis ([place]) -Continue aggressive therapeutic lifestyle changes  8. Left varicocele -This was not palpable today.  9. Controlled type 2 diabetes mellitus  with stage 2 chronic kidney disease, without long-term current use of insulin (HCC) -Continue with current treatment and aggressive therapeutic lifestyle changes  10. Thrombocytopenia (HCC) -No history of any bleeding issues CBC is pending  11. Loose bowel movements -Because the patient has had several bouts of loose bowel movements but usually no more than one daily we will do a stool test for C. difficile.  12. Rash and nonspecific skin eruption -Continue with cortisone 10 and Zyrtec. If rash persists we may want to try a stronger steroid cream to see if he is or gets better  response to this.  Patient Instructions                       Medicare Annual Wellness Visit  [facility] and the medical providers at [facility] strive to bring you the best medical care.  In doing so we not only want to address your current medical conditions and concerns but also to detect new conditions early and prevent illness, disease and health-related problems.    Medicare offers a yearly Wellness Visit which allows our clinical staff to assess your need for preventative services including immunizations, lifestyle education, counseling to decrease risk of preventable diseases and screening for fall risk and other medical concerns.    This visit is provided free of charge (no copay) for all Medicare recipients. The clinical pharmacists at [facility] have begun to conduct these Wellness Visits which will also include a thorough review of all your medications.    As you primary medical provider recommend that you make an appointment for your Annual Wellness Visit if you have not done so already this year.  You may set up this appointment before you leave today or you may call back ([phone]) and schedule an appointment.  Please make sure when you call that you mention that you are scheduling your Annual Wellness Visit with the clinical pharmacist so that the appointment may be made for the proper length of time.     Continue current medications. Continue good therapeutic lifestyle changes which include good diet and exercise. Fall precautions discussed with patient. If an FOBT was given today- please return it to our front desk. If you are over 9 years old - you may need Prevnar 16 or the adult Pneumonia vaccine.  **Flu shots are available--- please call and schedule a FLU-CLINIC appointment**  After your visit with [place] today you will receive a survey in the mail or online from [employer] regarding your care with [place]. Please take a moment to  fill this out. Your feedback is very important to [place] as you can help [place] better understand your patient needs as well as improve your experience and satisfaction. WE CARE ABOUT YOU!!!   Try to avoid taking any more antibiotics and protect yourself well from tick bites Since his been a while since she is seen the cardiologist we will arrange for a follow-up visit with him just because you have a history of diabetes and a family history of heart disease. Continue to monitor blood sugars closely and get as much exercise as possible   Arrie Senate MD

## 2015-12-07 ENCOUNTER — Other Ambulatory Visit: Payer: Medicare Other

## 2015-12-07 DIAGNOSIS — R197 Diarrhea, unspecified: Secondary | ICD-10-CM

## 2015-12-11 ENCOUNTER — Telehealth: Payer: Self-pay | Admitting: *Deleted

## 2015-12-11 NOTE — Telephone Encounter (Signed)
-----   Message from Chipper Herb, MD sent at 12/11/2015 12:04 PM EDT ----- Stool culture neg for salmonella,shigella, ecoli, camplobacter, c. Diff. O& p still pending====

## 2015-12-11 NOTE — Telephone Encounter (Signed)
Pt's wife notified of results Verbalizes understanding 

## 2015-12-13 LAB — CDIFF NAA+O+P+STOOL CULTURE
E COLI SHIGA TOXIN ASSAY: NEGATIVE
Toxigenic C. Difficile by PCR: NEGATIVE

## 2015-12-14 ENCOUNTER — Other Ambulatory Visit: Payer: Medicare Other

## 2015-12-14 DIAGNOSIS — E785 Hyperlipidemia, unspecified: Secondary | ICD-10-CM

## 2015-12-14 DIAGNOSIS — I10 Essential (primary) hypertension: Secondary | ICD-10-CM | POA: Diagnosis not present

## 2015-12-14 DIAGNOSIS — E559 Vitamin D deficiency, unspecified: Secondary | ICD-10-CM | POA: Diagnosis not present

## 2015-12-14 DIAGNOSIS — E119 Type 2 diabetes mellitus without complications: Secondary | ICD-10-CM

## 2015-12-14 DIAGNOSIS — K219 Gastro-esophageal reflux disease without esophagitis: Secondary | ICD-10-CM | POA: Diagnosis not present

## 2015-12-14 DIAGNOSIS — R7989 Other specified abnormal findings of blood chemistry: Secondary | ICD-10-CM

## 2015-12-14 DIAGNOSIS — N400 Enlarged prostate without lower urinary tract symptoms: Secondary | ICD-10-CM | POA: Diagnosis not present

## 2015-12-15 LAB — CBC WITH DIFFERENTIAL/PLATELET
BASOS ABS: 0.1 10*3/uL (ref 0.0–0.2)
BASOS: 1 %
EOS (ABSOLUTE): 0.6 10*3/uL — AB (ref 0.0–0.4)
Eos: 11 %
Hematocrit: 32.7 % — ABNORMAL LOW (ref 37.5–51.0)
Hemoglobin: 11.3 g/dL — ABNORMAL LOW (ref 12.6–17.7)
IMMATURE GRANS (ABS): 0 10*3/uL (ref 0.0–0.1)
IMMATURE GRANULOCYTES: 0 %
LYMPHS: 20 %
Lymphocytes Absolute: 1 10*3/uL (ref 0.7–3.1)
MCH: 32.7 pg (ref 26.6–33.0)
MCHC: 34.6 g/dL (ref 31.5–35.7)
MCV: 95 fL (ref 79–97)
Monocytes Absolute: 0.4 10*3/uL (ref 0.1–0.9)
Monocytes: 8 %
NEUTROS PCT: 60 %
Neutrophils Absolute: 3 10*3/uL (ref 1.4–7.0)
PLATELETS: 122 10*3/uL — AB (ref 150–379)
RBC: 3.46 x10E6/uL — AB (ref 4.14–5.80)
RDW: 13.6 % (ref 12.3–15.4)
WBC: 5.1 10*3/uL (ref 3.4–10.8)

## 2015-12-15 LAB — NMR, LIPOPROFILE
CHOLESTEROL: 111 mg/dL (ref 100–199)
HDL Cholesterol by NMR: 40 mg/dL (ref >39)
HDL PARTICLE NUMBER: 29.7 umol/L — AB (ref >=30.5)
LDL Particle Number: 457 nmol/L (ref <1000)
LDL SIZE: 20.5 nm (ref >20.5)
LDL-C: 49 mg/dL (ref 0–99)
LP-IR Score: 55 — ABNORMAL HIGH (ref <=45)
SMALL LDL PARTICLE NUMBER: 167 nmol/L (ref <=527)
TRIGLYCERIDES BY NMR: 112 mg/dL (ref 0–149)

## 2015-12-15 LAB — BMP8+EGFR
BUN/Creatinine Ratio: 12 (ref 10–24)
BUN: 16 mg/dL (ref 8–27)
CALCIUM: 9.2 mg/dL (ref 8.6–10.2)
CO2: 23 mmol/L (ref 18–29)
Chloride: 102 mmol/L (ref 96–106)
Creatinine, Ser: 1.37 mg/dL — ABNORMAL HIGH (ref 0.76–1.27)
GFR calc Af Amer: 58 mL/min/{1.73_m2} — ABNORMAL LOW (ref >59)
GFR, EST NON AFRICAN AMERICAN: 50 mL/min/{1.73_m2} — AB (ref >59)
Glucose: 131 mg/dL — ABNORMAL HIGH (ref 65–99)
POTASSIUM: 4.4 mmol/L (ref 3.5–5.2)
Sodium: 139 mmol/L (ref 134–144)

## 2015-12-15 LAB — HEPATIC FUNCTION PANEL
ALBUMIN: 4.2 g/dL (ref 3.5–4.8)
ALT: 24 IU/L (ref 0–44)
AST: 27 IU/L (ref 0–40)
Alkaline Phosphatase: 43 IU/L (ref 39–117)
BILIRUBIN TOTAL: 0.6 mg/dL (ref 0.0–1.2)
BILIRUBIN, DIRECT: 0.18 mg/dL (ref 0.00–0.40)
TOTAL PROTEIN: 6.3 g/dL (ref 6.0–8.5)

## 2015-12-15 LAB — PSA, TOTAL AND FREE
PSA, Free Pct: 28.9 %
PSA, Free: 0.52 ng/mL
Prostate Specific Ag, Serum: 1.8 ng/mL (ref 0.0–4.0)

## 2015-12-15 LAB — VITAMIN D 25 HYDROXY (VIT D DEFICIENCY, FRACTURES): Vit D, 25-Hydroxy: 42 ng/mL (ref 30.0–100.0)

## 2015-12-18 ENCOUNTER — Telehealth: Payer: Self-pay | Admitting: Family Medicine

## 2015-12-18 MED ORDER — DOXYCYCLINE HYCLATE 100 MG PO TABS: 100.0000 mg | ORAL_TABLET | Freq: Two times a day (BID) | ORAL | Status: DC

## 2015-12-18 NOTE — Telephone Encounter (Signed)
Patient wife aware

## 2015-12-18 NOTE — Telephone Encounter (Signed)
For the tick bites deer ticks or regular dog ticks? Call prescription in for doxycycline 100 mg twice daily with food for 3 weeks and Place 1 refill on this. I.e. for future tick bites.

## 2015-12-18 NOTE — Telephone Encounter (Signed)
Patient has 3 tick bites since Friday and would like a antibiotic sent to the pharmacy please

## 2015-12-23 ENCOUNTER — Other Ambulatory Visit: Payer: Self-pay | Admitting: Family Medicine

## 2016-01-22 ENCOUNTER — Other Ambulatory Visit: Payer: Self-pay | Admitting: Pharmacist

## 2016-01-25 ENCOUNTER — Encounter: Payer: Self-pay | Admitting: Cardiology

## 2016-01-31 ENCOUNTER — Other Ambulatory Visit: Payer: Medicare Other

## 2016-01-31 DIAGNOSIS — R7989 Other specified abnormal findings of blood chemistry: Secondary | ICD-10-CM

## 2016-01-31 DIAGNOSIS — R748 Abnormal levels of other serum enzymes: Secondary | ICD-10-CM | POA: Diagnosis not present

## 2016-02-01 ENCOUNTER — Telehealth: Payer: Self-pay | Admitting: Family Medicine

## 2016-02-01 LAB — BMP8+EGFR
BUN / CREAT RATIO: 9 — AB (ref 10–24)
BUN: 12 mg/dL (ref 8–27)
CO2: 24 mmol/L (ref 18–29)
Calcium: 9.3 mg/dL (ref 8.6–10.2)
Chloride: 97 mmol/L (ref 96–106)
Creatinine, Ser: 1.28 mg/dL — ABNORMAL HIGH (ref 0.76–1.27)
GFR calc Af Amer: 63 mL/min/{1.73_m2} (ref >59)
GFR calc non Af Amer: 55 mL/min/{1.73_m2} — ABNORMAL LOW (ref >59)
GLUCOSE: 167 mg/dL — AB (ref 65–99)
POTASSIUM: 4.8 mmol/L (ref 3.5–5.2)
SODIUM: 136 mmol/L (ref 134–144)

## 2016-02-01 NOTE — Telephone Encounter (Signed)
Patient wife aware

## 2016-02-05 ENCOUNTER — Encounter: Payer: Self-pay | Admitting: Cardiology

## 2016-02-05 NOTE — Progress Notes (Signed)
Cardiology Office Note   Date:  02/07/2016   ID:  Joe [name], DOB [birth year], MRN [id number]  PCP:  Redge Gainer, MD  Cardiologist:   Minus Breeding, MD   Chief Complaint  Patient presents with  . Abnormal ECG      History of Present Illness: Joe [name] is a 75 y.o. male who presents for evaluation of multiple cardiovascular risk factors.  I saw him in 2011.  He had an abnormal stress test with RBBB on EKG.  He had a follow up nuclear study that was negative.   There is a mention in the chart of aortic atherosclerosis.  I saw this noted on a lumbar spine x-ray.  He is very active. He exercises routinely including 30 minutes on an elliptical machine.  He might get dyspneic with moderate exertion.  He denies any resting shortness of breath, PND or orthopnea.The patient denies any new symptoms such as chest discomfort, neck or arm discomfort. There have been no reported palpitations, presyncope or syncope.  Past Medical History:  Diagnosis Date  . Arthritis   . Cataract   . Colon polyps   . Diabetes mellitus without complication ([place])   . Dyslipidemia   . ESOPHAGITIS, REFLUX 07/09/2004   Qualifier: Diagnosis of  By: Hardin Negus CMA (AAMA), [place]    . Hiatal hernia   . HOH (hard of hearing)    has bilateral Hearing aids  . Hypertension   . Irritable bowel syndrome   . Kidney stone 1970  . Pneumonia   . Tick bites    took 5 rounds of Doxycycline this summer    Past Surgical History:  Procedure Laterality Date  . COLONOSCOPY    . ESOPHAGEAL DILATION  1996  . ESOPHAGEAL DILATION  2003  . FLEXIBLE SIGMOIDOSCOPY    . HEMICOLECTOMY  08/2005   Right  . KNEE SURGERY Right   . TONSILLECTOMY       Current Outpatient Prescriptions  Medication Sig Dispense Refill  . aspirin 81 MG tablet Take 81 mg by mouth daily.      . cholecalciferol (VITAMIN D) 1000 UNITS tablet Take 2,000 Units by mouth daily.     . Cinnamon 500 MG TABS Take 1,000 tablets by mouth 2 (two) times  daily.    Marland Kitchen doxylamine, Sleep, (UNISOM) 25 MG tablet Take 25 mg by mouth at bedtime as needed.    Marland Kitchen esomeprazole (NEXIUM) 40 MG capsule Take 40 mg by mouth daily at 12 noon.    . Melatonin 5 MG TABS Take 1 tablet by mouth daily.      . metFORMIN (GLUCOPHAGE) 1000 MG tablet Take 1 tablet (1,000 mg total) by mouth 2 (two) times daily with a meal. 180 tablet 0  . simvastatin (ZOCOR) 40 MG tablet Take 40 mg by mouth daily.    . sitaGLIPtin (JANUVIA) 100 MG tablet Take 100 mg by mouth daily.    . valsartan-hydrochlorothiazide (DIOVAN-HCT) 160-12.5 MG tablet Take 1 tablet by mouth daily.    Marland Kitchen glucose blood (ONE TOUCH ULTRA TEST) test strip Check BS qid  Dx: E11.9 100 each 11  . ONETOUCH DELICA LANCETS 99991111 MISC      No current facility-administered medications for this visit.     Allergies:   Ace inhibitors and Trazamine [trazodone & diet manage prod]    Social History:  The patient  reports that he quit smoking about 31 years ago. His smoking use included Cigarettes. He started smoking about 61 years  ago. He smoked 2.00 packs per day. He has never used smokeless tobacco. He reports that he drinks about 2.5 oz of alcohol per week . He reports that he does not use drugs.   Family History:  The patient's family history includes Breast cancer in his mother; Cancer in his brother; Diabetes in his brother; Heart disease in his brother; Hyperlipidemia in his brother and father; Hypertension in his brother and father; Stroke in his brother; Stroke (age of onset: 29) in his father.    ROS:  Please see the history of present illness.   Otherwise, review of systems are positive for Back pain, reflux, pain..   All other systems are reviewed and negative.    PHYSICAL EXAM: VS:  BP 128/62   Pulse 60   Ht 5\' 9"  (1.753 m)   Wt 178 lb (80.7 kg)   BMI 26.29 kg/m  , BMI Body mass index is 26.29 kg/m. GENERAL:  Well appearing HEENT:  Pupils equal round and reactive, fundi not visualized, oral mucosa  unremarkable NECK:  No jugular venous distention, waveform within normal limits, carotid upstroke brisk and symmetric, no bruits, no thyromegaly LYMPHATICS:  No cervical, inguinal adenopathy LUNGS:  Clear to auscultation bilaterally BACK:  No CVA tenderness CHEST:  Unremarkable HEART:  PMI not displaced or sustained,S1 and S2 within normal limits, no S3, no S4, no clicks, no rubs, no murmurs ABD:  Flat, positive bowel sounds normal in frequency in pitch, no bruits, no rebound, no guarding, no midline pulsatile mass, no hepatomegaly, no splenomegaly EXT:  2 plus pulses throughout, no edema, no cyanosis no clubbing SKIN:  No rashes no nodules NEURO:  Cranial nerves II through XII grossly intact, motor grossly intact throughout PSYCH:  Cognitively intact, oriented to person place and time    EKG:  EKG  Is not ordered today. The ekg ordered 12/04/15 demonstrates sinus rhythm, rate 57 right bundle branch block   Recent Labs: 12/14/2015: ALT 24; Platelets 122 01/31/2016: BUN 12; Creatinine, Ser 1.28; Potassium 4.8; Sodium 136    Lipid Panel    Component Value Date/Time   CHOL 111 12/14/2015 0906   CHOL 105 12/24/2012 0850   TRIG 112 12/14/2015 0906   TRIG 46 12/24/2012 0850   HDL 40 12/14/2015 0906   HDL 44 12/24/2012 0850   LDLCALC 59 02/03/2014 0840   LDLCALC 52 12/24/2012 0850      Wt Readings from Last 3 Encounters:  02/07/16 178 lb (80.7 kg)  12/04/15 181 lb (82.1 kg)  11/01/15 179 lb (81.2 kg)      Other studies Reviewed: Additional studies/ records that were reviewed today include: Labs and Xray. Review of the above records demonstrates:  Please see elsewhere in the note.     ASSESSMENT AND PLAN:  ATHEROSCLEROSIS:  The patient doesn't atherosclerosis is noted on x-ray. He has significant cardiovascular risk factors. Stress testing would be somewhat difficult to interpret with a POET (Plain Old Exercise Treadmill) alone. Therefore, I will will bring him back for a  coronary calcium score and proceed with further evaluation based on this result.  HTN:   The blood pressure is at target. No change in medications is indicated. We will continue with therapeutic lifestyle changes (TLC).  DYSLIPIDEMIA:  His lipid profile is excellent. Your continuing the meds as listed.   Current medicines are reviewed at length with the patient today.  The patient does not have concerns regarding medicines.  The following changes have been made:  no change  Labs/ tests ordered today include:   Orders Placed This Encounter  Procedures  . CT CARDIAC SCORING     Disposition:   FU with me as needed.      Signed, Minus Breeding, MD  02/07/2016 4:05 PM    [facility]

## 2016-02-07 ENCOUNTER — Ambulatory Visit (INDEPENDENT_AMBULATORY_CARE_PROVIDER_SITE_OTHER): Payer: Medicare Other | Admitting: Cardiology

## 2016-02-07 ENCOUNTER — Encounter: Payer: Self-pay | Admitting: Cardiology

## 2016-02-07 VITALS — BP 128/62 | HR 60 | Ht 69.0 in | Wt 178.0 lb

## 2016-02-07 DIAGNOSIS — R9431 Abnormal electrocardiogram [ECG] [EKG]: Secondary | ICD-10-CM

## 2016-02-07 DIAGNOSIS — I700 Atherosclerosis of aorta: Secondary | ICD-10-CM

## 2016-02-07 DIAGNOSIS — I10 Essential (primary) hypertension: Secondary | ICD-10-CM

## 2016-02-07 NOTE — Patient Instructions (Addendum)
Medication Instructions:  The current medical regimen is effective;  continue present plan and medications.  Testing/Procedures: Your physician has requested that you have cardiac CA Score. Cardiac computed tomography (CT) is a painless test that uses an x-ray machine to take clear, detailed pictures of your heart.  [place] [address] floor [place], [place].  Cost $150.  Follow-Up: Follow up as needed  After testing.  If you need a refill on your cardiac medications before your next appointment, please call your pharmacy.  Thank you for choosing [facility]!!

## 2016-02-16 ENCOUNTER — Inpatient Hospital Stay: Admission: RE | Admit: 2016-02-16 | Payer: Medicare Other | Source: Ambulatory Visit

## 2016-02-21 ENCOUNTER — Other Ambulatory Visit: Payer: Self-pay | Admitting: Family Medicine

## 2016-02-22 ENCOUNTER — Ambulatory Visit (INDEPENDENT_AMBULATORY_CARE_PROVIDER_SITE_OTHER): Payer: Medicare Other | Admitting: Family Medicine

## 2016-02-22 ENCOUNTER — Encounter: Payer: Self-pay | Admitting: Family Medicine

## 2016-02-22 ENCOUNTER — Ambulatory Visit (INDEPENDENT_AMBULATORY_CARE_PROVIDER_SITE_OTHER): Payer: Medicare Other

## 2016-02-22 VITALS — BP 149/66 | HR 58 | Temp 97.2°F | Ht 69.0 in | Wt 177.0 lb

## 2016-02-22 DIAGNOSIS — I10 Essential (primary) hypertension: Secondary | ICD-10-CM | POA: Diagnosis not present

## 2016-02-22 DIAGNOSIS — S46912A Strain of unspecified muscle, fascia and tendon at shoulder and upper arm level, left arm, initial encounter: Secondary | ICD-10-CM | POA: Diagnosis not present

## 2016-02-22 DIAGNOSIS — E119 Type 2 diabetes mellitus without complications: Secondary | ICD-10-CM

## 2016-02-22 NOTE — Patient Instructions (Signed)
Use warm wet compresses 20 minutes 3 or 4 times daily Do gentle range of motion exercises i.e. climbing wall and circular motions Avoid any heavy lifting pushing pulling Monitor blood sugars closely

## 2016-02-22 NOTE — Addendum Note (Signed)
Addended by: Zannie Cove on: 02/22/2016 08:18 AM   Modules accepted: Orders

## 2016-02-22 NOTE — Progress Notes (Signed)
Subjective:    [name], male    DOB: 1941-04-11, 75 y.o.   MRN: [id number]  HPI Patient here today for left shoulder pain.3 months ago the patient picked up a concrete bag that weight 94 pounds. He's been working with concrete a lot and the past few weeks. He does not recall any other injury. He has not had any x-rays of the shoulder. The patient does have diabetes and hypertension. We reminded him that any injection may make his sugar go up more and he will be prepared for that. We also reminded him that anti-inflammatory medicines could irritate his esophagus of he prefers to go with the injection route.    Patient Active Problem List   Diagnosis Date Noted  . Thrombocytopenia ([place]) 03/18/2015  . Sebaceous cyst 01/23/2015  . Atopic dermatitis 01/23/2015  . Foot lesion 01/16/2015  . Thoracic disc herniation 06/20/2014  . Left varicocele 06/20/2014  . Abdominal aortic atherosclerosis ([place]) 06/20/2014  . Ventral hernia without obstruction or gangrene 02/04/2014  . Personal history of colonic polyps 02/04/2014  . Hyperlipemia 12/30/2012  . Hypertension 12/30/2012  . BPH (benign prostatic hypertrophy) 12/30/2012  . Diabetes type 2, controlled ([place]) 09/28/2012  . RBBB 04/11/2010  . CARDIOVASCULAR FUNCTION STUDY, ABNORMAL 04/11/2010  . Diaphragmatic hernia 09/14/2007  . NEPHROLITHIASIS 09/14/2007   Outpatient Encounter Prescriptions as of 02/22/2016  Medication Sig  . aspirin 81 MG tablet Take 81 mg by mouth daily.    . cholecalciferol (VITAMIN D) 1000 UNITS tablet Take 2,000 Units by mouth daily.   . Cinnamon 500 MG TABS Take 1,000 tablets by mouth 2 (two) times daily.  Marland Kitchen doxylamine, Sleep, (UNISOM) 25 MG tablet Take 25 mg by mouth at bedtime as needed.  Marland Kitchen esomeprazole (NEXIUM) 40 MG capsule Take 40 mg by mouth daily at 12 noon.  Marland Kitchen glucose blood (ONE TOUCH ULTRA TEST) test strip Check BS qid  Dx: E11.9  . Melatonin 5 MG TABS Take 1 tablet by mouth daily.    .  metFORMIN (GLUCOPHAGE) 1000 MG tablet Take 1 tablet (1,000 mg total) by mouth 2 (two) times daily with a meal.  . ONETOUCH DELICA LANCETS 99991111 MISC   . simvastatin (ZOCOR) 40 MG tablet TAKE 1 TABLET EVERY EVENING  . sitaGLIPtin (JANUVIA) 100 MG tablet Take 100 mg by mouth daily.  . valsartan-hydrochlorothiazide (DIOVAN-HCT) 160-12.5 MG tablet Take 1 tablet by mouth daily.  . [DISCONTINUED] simvastatin (ZOCOR) 40 MG tablet Take 40 mg by mouth daily.   No facility-administered encounter medications on file as of 02/22/2016.      Review of Systems  Constitutional: Negative.   HENT: Negative.   Eyes: Negative.   Respiratory: Negative.   Cardiovascular: Negative.   Gastrointestinal: Negative.   Endocrine: Negative.   Genitourinary: Negative.   Musculoskeletal: Positive for arthralgias (left shoulder pain).  Skin: Negative.   Allergic/Immunologic: Negative.   Neurological: Negative.   Hematological: Negative.   Psychiatric/Behavioral: Negative.        Objective:   Physical Exam  Constitutional: He is oriented to person, place, and time. He appears well-developed and well-nourished. No distress.  HENT:  Head: Normocephalic and atraumatic.  Eyes: Conjunctivae and EOM are normal. Pupils are equal, round, and reactive to light. Right eye exhibits no discharge. Left eye exhibits no discharge. No scleral icterus.  Neck: Normal range of motion.  Musculoskeletal: He exhibits tenderness. He exhibits no edema or deformity.  Limited range of motion of left shoulder with decreased abduction  ability. Pain is generalized between the biceps tendon the left before meals joint and the deltoid tendon insertion. 40 mg of Depo-Medrol was injected into the deltoid muscle area without problems and the patient tolerated the procedure well.  Neurological: He is alert and oriented to person, place, and time.  Skin: Skin is warm and dry. No rash noted.  Psychiatric: He has a normal mood and affect. His behavior  is normal. Judgment and thought content normal.  Nursing note and vitals reviewed.  BP (!) 156/70 (BP Location: Right Arm)   Pulse (!) 58   Temp 97.2 F (36.2 C) (Oral)   Ht 5\' 9"  (1.753 m)   Wt 177 lb (80.3 kg)   BMI 26.14 kg/m   [facility] reading (PRIMARY) by  Dr. Eber Jones of left shoulder with results pending                                      Assessment & Plan:  1. Type 2 diabetes mellitus without complication, without long-term current use of insulin (HCC) -Monitor blood sugars more closely following this injection of Depo-Medrol  2. Essential hypertension -Continue to watch sodium intake and monitor blood pressures closely  3. Left shoulder strain, initial encounter -40 mg of Depo-Medrol injected in to the deltoid muscle area where his pain and tenderness seemed to be focused the most.  Patient Instructions  Use warm wet compresses 20 minutes 3 or 4 times daily Do gentle range of motion exercises i.e. climbing wall and circular motions Avoid any heavy lifting pushing pulling Monitor blood sugars closely  Arrie Senate MD

## 2016-02-23 ENCOUNTER — Ambulatory Visit (INDEPENDENT_AMBULATORY_CARE_PROVIDER_SITE_OTHER)
Admission: RE | Admit: 2016-02-23 | Discharge: 2016-02-23 | Disposition: A | Discharge status: Home / Self Care | Payer: Self-pay | Source: Ambulatory Visit | Attending: Cardiology | Admitting: Cardiology

## 2016-02-23 DIAGNOSIS — I700 Atherosclerosis of aorta: Secondary | ICD-10-CM

## 2016-02-23 DIAGNOSIS — I10 Essential (primary) hypertension: Secondary | ICD-10-CM

## 2016-03-01 ENCOUNTER — Telehealth: Payer: Self-pay | Admitting: Cardiology

## 2016-03-01 DIAGNOSIS — R931 Abnormal findings on diagnostic imaging of heart and coronary circulation: Secondary | ICD-10-CM

## 2016-03-01 NOTE — Telephone Encounter (Signed)
Pt had a Cardiac CT on 02-23-16. The results were on my chart,they did not understand it. Please call to discuss his results.

## 2016-03-01 NOTE — Telephone Encounter (Signed)
Routed to [facility] for results review.

## 2016-03-01 NOTE — Telephone Encounter (Signed)
Returned call and spoke to patient and wife regarding recent cardiac CT. Aware of results and recommendations. Aware I will order a lexiscan and someone will contact patient with test arrangements and instructions. Reassurance given regarding recent test. Pt's wife verbalized thanks.

## 2016-03-05 ENCOUNTER — Other Ambulatory Visit: Payer: Self-pay | Admitting: *Deleted

## 2016-03-05 DIAGNOSIS — R931 Abnormal findings on diagnostic imaging of heart and coronary circulation: Secondary | ICD-10-CM

## 2016-03-05 DIAGNOSIS — E7800 Pure hypercholesterolemia, unspecified: Secondary | ICD-10-CM

## 2016-03-05 DIAGNOSIS — R0602 Shortness of breath: Secondary | ICD-10-CM

## 2016-03-05 DIAGNOSIS — R9430 Abnormal result of cardiovascular function study, unspecified: Secondary | ICD-10-CM

## 2016-03-05 DIAGNOSIS — I10 Essential (primary) hypertension: Secondary | ICD-10-CM

## 2016-03-05 NOTE — Progress Notes (Signed)
Ordered again d/t location not being listed previously.

## 2016-03-21 ENCOUNTER — Other Ambulatory Visit: Payer: Self-pay | Admitting: Family Medicine

## 2016-03-21 ENCOUNTER — Telehealth (HOSPITAL_COMMUNITY): Payer: Self-pay

## 2016-03-21 NOTE — Telephone Encounter (Signed)
Encounter complete. 

## 2016-03-22 ENCOUNTER — Encounter (HOSPITAL_COMMUNITY): Payer: Medicare Other

## 2016-03-26 ENCOUNTER — Ambulatory Visit (HOSPITAL_COMMUNITY)
Admission: RE | Admit: 2016-03-26 | Discharge: 2016-03-26 | Disposition: A | Discharge status: Home / Self Care | Payer: Medicare Other | Source: Ambulatory Visit | Attending: Cardiology | Admitting: Cardiology

## 2016-03-26 DIAGNOSIS — I10 Essential (primary) hypertension: Secondary | ICD-10-CM | POA: Insufficient documentation

## 2016-03-26 DIAGNOSIS — R9430 Abnormal result of cardiovascular function study, unspecified: Secondary | ICD-10-CM | POA: Diagnosis not present

## 2016-03-26 DIAGNOSIS — R931 Abnormal findings on diagnostic imaging of heart and coronary circulation: Secondary | ICD-10-CM

## 2016-03-26 DIAGNOSIS — E7800 Pure hypercholesterolemia, unspecified: Secondary | ICD-10-CM | POA: Diagnosis not present

## 2016-03-26 DIAGNOSIS — R0602 Shortness of breath: Secondary | ICD-10-CM | POA: Insufficient documentation

## 2016-03-26 LAB — MYOCARDIAL PERFUSION IMAGING
CHL CUP NUCLEAR SDS: 0
CHL CUP RESTING HR STRESS: 61 {beats}/min
LV dias vol: 119 mL (ref 62–150)
LV sys vol: 45 mL
NUC STRESS TID: 1.2
Peak HR: 82 {beats}/min
SRS: 1
SSS: 1

## 2016-03-26 MED ORDER — REGADENOSON 0.4 MG/5ML IV SOLN
0.4000 mg | Freq: Once | INTRAVENOUS | Status: AC
  Administered 2016-03-26: 0.4 mg via INTRAVENOUS

## 2016-03-26 MED ORDER — TECHNETIUM TC 99M TETROFOSMIN IV KIT
11.0000 | PACK | Freq: Once | INTRAVENOUS | Status: AC | PRN
  Administered 2016-03-26: 11 via INTRAVENOUS
  Filled 2016-03-26: qty 11

## 2016-03-26 MED ORDER — TECHNETIUM TC 99M TETROFOSMIN IV KIT
30.4000 | PACK | Freq: Once | INTRAVENOUS | Status: AC | PRN
  Administered 2016-03-26: 30.4 via INTRAVENOUS
  Filled 2016-03-26: qty 31

## 2016-03-27 ENCOUNTER — Telehealth: Payer: Self-pay | Admitting: Family Medicine

## 2016-03-27 NOTE — Telephone Encounter (Signed)
error 

## 2016-03-28 ENCOUNTER — Ambulatory Visit (INDEPENDENT_AMBULATORY_CARE_PROVIDER_SITE_OTHER): Payer: Medicare Other

## 2016-03-28 DIAGNOSIS — Z23 Encounter for immunization: Secondary | ICD-10-CM | POA: Diagnosis not present

## 2016-04-04 ENCOUNTER — Encounter: Payer: Self-pay | Admitting: Family Medicine

## 2016-04-04 ENCOUNTER — Ambulatory Visit (INDEPENDENT_AMBULATORY_CARE_PROVIDER_SITE_OTHER): Payer: Medicare Other | Admitting: Family Medicine

## 2016-04-04 VITALS — BP 124/74 | HR 68 | Temp 97.1°F | Ht 69.0 in | Wt 177.0 lb

## 2016-04-04 DIAGNOSIS — S90821A Blister (nonthermal), right foot, initial encounter: Secondary | ICD-10-CM | POA: Diagnosis not present

## 2016-04-04 DIAGNOSIS — E119 Type 2 diabetes mellitus without complications: Secondary | ICD-10-CM

## 2016-04-04 DIAGNOSIS — M25512 Pain in left shoulder: Secondary | ICD-10-CM

## 2016-04-04 MED ORDER — MUPIROCIN 2 % EX OINT: 1.0000 "application " | TOPICAL_OINTMENT | Freq: Two times a day (BID) | CUTANEOUS | 1 refills | Status: DC

## 2016-04-04 MED ORDER — CEPHALEXIN 500 MG PO CAPS: 500.0000 mg | ORAL_CAPSULE | Freq: Three times a day (TID) | ORAL | 0 refills | Status: DC

## 2016-04-04 NOTE — Progress Notes (Signed)
Subjective:    [name], male    DOB: Feb 22, 1941, 75 y.o.   MRN: [id number]  HPI Patient here today for a blister on the right foot and left shoulder pain.The patient recently received an injection to the right deltoid area and he still having problems with his shoulder. He was also tender that time in the biceps tendon. We will possibly go back and do one more injection in the biceps tendon area.     Patient Active Problem List   Diagnosis Date Noted  . Thrombocytopenia ([place]) 03/18/2015  . Sebaceous cyst 01/23/2015  . Atopic dermatitis 01/23/2015  . Foot lesion 01/16/2015  . Thoracic disc herniation 06/20/2014  . Left varicocele 06/20/2014  . Abdominal aortic atherosclerosis ([place]) 06/20/2014  . Ventral hernia without obstruction or gangrene 02/04/2014  . Personal history of colonic polyps 02/04/2014  . Hyperlipemia 12/30/2012  . Hypertension 12/30/2012  . BPH (benign prostatic hypertrophy) 12/30/2012  . Diabetes type 2, controlled ([place]) 09/28/2012  . RBBB 04/11/2010  . CARDIOVASCULAR FUNCTION STUDY, ABNORMAL 04/11/2010  . Diaphragmatic hernia 09/14/2007  . NEPHROLITHIASIS 09/14/2007   Outpatient Encounter Prescriptions as of 04/04/2016  Medication Sig  . aspirin 81 MG tablet Take 81 mg by mouth daily.    . cholecalciferol (VITAMIN D) 1000 UNITS tablet Take 2,000 Units by mouth daily.   . Cinnamon 500 MG TABS Take 1,000 tablets by mouth 2 (two) times daily.  Marland Kitchen doxylamine, Sleep, (UNISOM) 25 MG tablet Take 25 mg by mouth at bedtime as needed.  Marland Kitchen esomeprazole (NEXIUM) 40 MG capsule Take 40 mg by mouth daily at 12 noon.  Marland Kitchen glucose blood (ONE TOUCH ULTRA TEST) test strip Check BS qid  Dx: E11.9  . JANUVIA 100 MG tablet TAKE 1 TABLET DAILY  . Melatonin 5 MG TABS Take 1 tablet by mouth daily.    . metFORMIN (GLUCOPHAGE) 1000 MG tablet Take 1 tablet (1,000 mg total) by mouth 2 (two) times daily with a meal.  . ONETOUCH DELICA LANCETS 99991111 MISC   . simvastatin  (ZOCOR) 40 MG tablet TAKE 1 TABLET EVERY EVENING  . valsartan-hydrochlorothiazide (DIOVAN-HCT) 160-12.5 MG tablet Take 1 tablet by mouth daily.   No facility-administered encounter medications on file as of 04/04/2016.       Review of Systems  Constitutional: Negative.   HENT: Negative.   Eyes: Negative.   Respiratory: Negative.   Cardiovascular: Negative.   Gastrointestinal: Negative.   Endocrine: Negative.   Genitourinary: Negative.   Musculoskeletal: Positive for arthralgias (left shoulder pain).  Skin: Negative.        Blister on right foot  Allergic/Immunologic: Negative.   Neurological: Negative.   Hematological: Negative.   Psychiatric/Behavioral: Negative.        Objective:   Physical Exam  Constitutional: He appears well-developed and well-nourished. No distress.  HENT:  Head: Normocephalic.  Eyes: Conjunctivae and EOM are normal. Pupils are equal, round, and reactive to light. Right eye exhibits no discharge. Left eye exhibits no discharge. No scleral icterus.  Musculoskeletal: Normal range of motion. He exhibits tenderness.  There is tenderness at the left acromioclavicular joint area.  Neurological: He is alert.  Skin: Skin is warm and dry. There is erythema.  There is minimal drainage and no purulence drainage There is a small blister on the right posterior heel.  Psychiatric: He has a normal mood and affect. His behavior is normal. Judgment and thought content normal.  Nursing note and vitals reviewed.  BP 124/74 (BP  Location: Right Arm)   Pulse 68   Temp 97.1 F (36.2 C) (Oral)   Ht 5\' 9"  (1.753 m)   Wt 177 lb (80.3 kg)   BMI 26.14 kg/m         Assessment & Plan:  1. Type 2 diabetes mellitus without complication, without long-term current use of insulin ([place]) -Watch blood sugars closely  2. Blister of right heel, initial encounter -Take antibiotic as directed until completed and soak heel and warm salty water and apply mupirocin ointment after  soaking  3. Arthralgia of left acromioclavicular joint -Use Voltaren ointment sparingly 3 or 4 times daily to the left shoulder -Return to clinic in a couple weeks and if heel has been healed we will consider injecting the lef acromioclavicular joint at that time.  Meds ordered this encounter  Medications  . cephALEXin (KEFLEX) 500 MG capsule    Sig: Take 1 capsule (500 mg total) by mouth 3 (three) times daily.    Dispense:  30 capsule    Refill:  0  . mupirocin ointment (BACTROBAN) 2 %    Sig: Apply 1 application topically 2 (two) times daily.    Dispense:  22 g    Refill:  1   Patient Instructions  May use Voltaren ointment to left shoulder 3 or 4 times daily sparingly Take antibiotic until completed If shoulder is no better on return we will inject with Marcaine and steroid  Soak heel and foot in warm salty water and use mupirocin ointment after soaking. Let this area remains open to the air as much as possible. Use a dressing if wearing shoes.  Arrie Senate MD

## 2016-04-04 NOTE — Patient Instructions (Signed)
May use Voltaren ointment to left shoulder 3 or 4 times daily sparingly Take antibiotic until completed If shoulder is no better on return we will inject with Marcaine and steroid  Soak heel and foot in warm salty water and use mupirocin ointment after soaking. Let this area remains open to the air as much as possible. Use a dressing if wearing shoes.

## 2016-04-19 ENCOUNTER — Ambulatory Visit: Payer: Medicare Other | Admitting: Family Medicine

## 2016-04-20 ENCOUNTER — Other Ambulatory Visit: Payer: Self-pay | Admitting: Family Medicine

## 2016-04-24 ENCOUNTER — Encounter: Payer: Self-pay | Admitting: Pharmacist

## 2016-04-24 ENCOUNTER — Ambulatory Visit (INDEPENDENT_AMBULATORY_CARE_PROVIDER_SITE_OTHER): Payer: Medicare Other | Admitting: Pharmacist

## 2016-04-24 VITALS — BP 110/70 | HR 64 | Ht 69.0 in | Wt 177.0 lb

## 2016-04-24 DIAGNOSIS — E119 Type 2 diabetes mellitus without complications: Secondary | ICD-10-CM

## 2016-04-24 LAB — BAYER DCA HB A1C WAIVED: HB A1C (BAYER DCA - WAIVED): 7.3 % — ABNORMAL HIGH (ref <7.0)

## 2016-04-24 LAB — GLUCOSE HEMOCUE WAIVED: Glu Hemocue Waived: 280 mg/dL — ABNORMAL HIGH (ref 65–99)

## 2016-04-24 NOTE — Progress Notes (Signed)
Patient ID: TRUITT CIFARELLI, male   DOB: [birth year], 75 y.o.   MRN: [id number]  Subjective:    KENTAVIS BOCIAN is a 75 y.o. male who presents for evaluation of Type 2 diabetes mellitus. Mr. Gambale states that he BG was good (ranged in 110's to 140's in am and 150's in pm until about 1 month ago).  Now his BG has been in 150's to 200's in am and 170's to 240's in then evening.  He did received cortisone injection 02/22/2016 and had a stress test 03/26/2016.    Current symptoms/problems include hyperglycemia  Known diabetic complications: none Cardiovascular risk factors: advanced age (older than 22 for men, 69 for women), diabetes mellitus, dyslipidemia, hypertension, male gender and obesity (BMI >= 30 kg/m2)   Eye exam current (within one year): yes - Dr Marin Comment 08/2015 Weight trend: stable Prior visit with dietician: no Current diet: in general, a "healthy" diet   -  Patient actually eats very little carbs and in fact his wife expresses how little he does eat for a man that is active.  Current exercise: goes to [facility] about 3 times per week - but has not been lifting weight or exericing as long over the last 1-2 months due to shoulder pain.  He has discussed this with his PCP and has follow up appt 05/18/2016   Is He on ACE inhibitor or angiotensin II receptor blocker?  Yes    valsartan + HCTZ (Diovan HCT) 320/25mg  take 1/4 tablet daily - patient states it is difficult to cut in quarters.    The following portions of the patient's history were reviewed and updated as appropriate: allergies, current medications, past family history, past medical history, past social history, past surgical history and problem list.    Objective:    BP 110/70   Pulse 64   Ht 5\' 9"  (1.753 m)   Wt 177 lb (80.3 kg)   BMI 26.14 kg/m   A1c = 7.3% today RBG was 280  Lab Review Glucose (mg/dL)  Date Value  01/31/2016 167 (H)  12/14/2015 131 (H)  07/28/2015 201 (H)   Glucose, Bld (mg/dL)  Date Value   12/24/2012 106 (H)   CO2 (mmol/L)  Date Value  01/31/2016 24  12/14/2015 23  07/28/2015 18   BUN (mg/dL)  Date Value  01/31/2016 12  12/14/2015 16  07/28/2015 17   Creat (mg/dL)  Date Value  12/24/2012 1.17   Creatinine, Ser (mg/dL)  Date Value  01/31/2016 1.28 (H)  12/14/2015 1.37 (H)  07/28/2015 1.19    Assessment:    Diabetes Mellitus type II, under inadequate control.  -     Plan:    1.  Rx changes: Continue  Januvia to 100mg  1 tablet daily and metformin 1000mg  1 tablet bid      Add Jardiance 10mg  1 tablet daily - risk and benefits/ side effects discussed with patient 2.  Education: Reviewed 'ABCs' of diabetes management (respective goals in parentheses):  A1C (<7), blood pressure (<130/80), and cholesterol (LDL <100). 3.  Reviewed CHO counting diet - stressed to patient that it is important to get enough CHO for energy and health - goal is 50 to 60 grams per meal and 15 to 20 grams per snack.   4. Follow up: 3 weeks  to see PCP  Cherre Robins, PharmD, CPP, CDE  Patient ID: Hetty Blend, male   DOB: [birth year], 75 y.o.   MRN: [id number]

## 2016-05-10 ENCOUNTER — Telehealth: Payer: Self-pay | Admitting: Family Medicine

## 2016-05-20 ENCOUNTER — Encounter: Payer: Self-pay | Admitting: Family Medicine

## 2016-05-20 ENCOUNTER — Ambulatory Visit (INDEPENDENT_AMBULATORY_CARE_PROVIDER_SITE_OTHER): Payer: Medicare Other

## 2016-05-20 ENCOUNTER — Other Ambulatory Visit: Payer: Self-pay | Admitting: Family Medicine

## 2016-05-20 ENCOUNTER — Other Ambulatory Visit: Payer: Self-pay | Admitting: Pharmacist

## 2016-05-20 ENCOUNTER — Ambulatory Visit (INDEPENDENT_AMBULATORY_CARE_PROVIDER_SITE_OTHER): Payer: Medicare Other | Admitting: Family Medicine

## 2016-05-20 VITALS — BP 115/61 | HR 62 | Temp 97.7°F | Ht 69.0 in | Wt 174.0 lb

## 2016-05-20 DIAGNOSIS — I10 Essential (primary) hypertension: Secondary | ICD-10-CM | POA: Diagnosis not present

## 2016-05-20 DIAGNOSIS — R931 Abnormal findings on diagnostic imaging of heart and coronary circulation: Secondary | ICD-10-CM

## 2016-05-20 DIAGNOSIS — M25512 Pain in left shoulder: Secondary | ICD-10-CM | POA: Diagnosis not present

## 2016-05-20 DIAGNOSIS — D696 Thrombocytopenia, unspecified: Secondary | ICD-10-CM

## 2016-05-20 DIAGNOSIS — E7800 Pure hypercholesterolemia, unspecified: Secondary | ICD-10-CM | POA: Diagnosis not present

## 2016-05-20 DIAGNOSIS — E119 Type 2 diabetes mellitus without complications: Secondary | ICD-10-CM

## 2016-05-20 DIAGNOSIS — K219 Gastro-esophageal reflux disease without esophagitis: Secondary | ICD-10-CM

## 2016-05-20 DIAGNOSIS — E559 Vitamin D deficiency, unspecified: Secondary | ICD-10-CM | POA: Diagnosis not present

## 2016-05-20 MED ORDER — EMPAGLIFLOZIN 10 MG PO TABS: 10.0000 mg | ORAL_TABLET | Freq: Every day | ORAL | 2 refills | Status: DC

## 2016-05-20 NOTE — Patient Instructions (Addendum)
Medicare Annual Wellness Visit  [facility] and the medical providers at [facility] strive to bring you the best medical care.  In doing so we not only want to address your current medical conditions and concerns but also to detect new conditions early and prevent illness, disease and health-related problems.    Medicare offers a yearly Wellness Visit which allows our clinical staff to assess your need for preventative services including immunizations, lifestyle education, counseling to decrease risk of preventable diseases and screening for fall risk and other medical concerns.    This visit is provided free of charge (no copay) for all Medicare recipients. The clinical pharmacists at [facility] have begun to conduct these Wellness Visits which will also include a thorough review of all your medications.    As you primary medical provider recommend that you make an appointment for your Annual Wellness Visit if you have not done so already this year.  You may set up this appointment before you leave today or you may call back [phone] and schedule an appointment.  Please make sure when you call that you mention that you are scheduling your Annual Wellness Visit with the clinical pharmacist so that the appointment may be made for the proper length of time.     Continue current medications. Continue good therapeutic lifestyle changes which include good diet and exercise. Fall precautions discussed with patient. If an FOBT was given today- please return it to our front desk. If you are over 42 years old - you may need Prevnar 12 or the adult Pneumonia vaccine.  **Flu shots are available--- please call and schedule a FLU-CLINIC appointment**  After your visit with [place] today you will receive a survey in the mail or online from [employer] regarding your care with [place]. Please take a moment to fill this out. Your feedback is very  important to [place] as you can help [place] better understand your patient needs as well as improve your experience and satisfaction. WE CARE ABOUT YOU!!!   Continue current meds and follow up as directed Call us if the shoulder doesn't get better.

## 2016-05-20 NOTE — Progress Notes (Signed)
Subjective:    [name], male    DOB: April 14, 1941, 75 y.o.   MRN: [id number]  HPI Pt here for follow up and management of chronic medical problems which includes diabetes, hyperlipidemia and hypertension. He is taking medication regularly.The patient has recently in October had a nuclear stress test that was having no ischemia and was having normal wall motion. The ejection fraction was 62%. He also had left shoulder films which showed some acromioclavicular joint arthritis. The patient is doing well overall. He denies any chest pain pressure palpitations. He denies any shortness of breath. He is doing well list intestinal tract with no nausea vomiting diarrhea or blood in the stool. He is passing his water without problems except some frequency because of the diabetic medicines. His blood sugars have been running much better at home on the farxiga.     Patient Active Problem List   Diagnosis Date Noted  . Thrombocytopenia ([place]) 03/18/2015  . Sebaceous cyst 01/23/2015  . Atopic dermatitis 01/23/2015  . Foot lesion 01/16/2015  . Thoracic disc herniation 06/20/2014  . Left varicocele 06/20/2014  . Abdominal aortic atherosclerosis ([place]) 06/20/2014  . Ventral hernia without obstruction or gangrene 02/04/2014  . Personal history of colonic polyps 02/04/2014  . Hyperlipemia 12/30/2012  . Hypertension 12/30/2012  . BPH (benign prostatic hypertrophy) 12/30/2012  . Diabetes type 2, controlled ([place]) 09/28/2012  . RBBB 04/11/2010  . CARDIOVASCULAR FUNCTION STUDY, ABNORMAL 04/11/2010  . Diaphragmatic hernia 09/14/2007  . NEPHROLITHIASIS 09/14/2007   Outpatient Encounter Prescriptions as of 05/20/2016  Medication Sig  . aspirin 81 MG tablet Take 81 mg by mouth daily.    . cholecalciferol (VITAMIN D) 1000 UNITS tablet Take 2,000 Units by mouth daily.   . Cinnamon 500 MG TABS Take 1,000 tablets by mouth 2 (two) times daily.  Marland Kitchen doxylamine, Sleep, (UNISOM) 25 MG tablet Take 25 mg  by mouth at bedtime as needed.  . empagliflozin (JARDIANCE) 10 MG TABS tablet Take 10 mg by mouth daily.  Marland Kitchen esomeprazole (NEXIUM) 40 MG capsule TAKE (1) CAPSULE DAILY  . glucose blood (ONE TOUCH ULTRA TEST) test strip Check BS qid  Dx: E11.9  . JANUVIA 100 MG tablet TAKE 1 TABLET DAILY  . Melatonin 5 MG TABS Take 1 tablet by mouth daily.    . metFORMIN (GLUCOPHAGE) 1000 MG tablet Take 1 and 1/2 tablets each morning and 1 tablet each evening.  . mupirocin ointment (BACTROBAN) 2 % Apply 1 application topically 2 (two) times daily.  . ONE TOUCH ULTRA TEST test strip CHECK BLOOD SUGAR 4 TIMES A DAY  . ONETOUCH DELICA LANCETS 78G MISC   . simvastatin (ZOCOR) 40 MG tablet TAKE 1 TABLET EVERY EVENING  . valsartan-hydrochlorothiazide (DIOVAN-HCT) 160-12.5 MG tablet Take 1 tablet by mouth daily.   No facility-administered encounter medications on file as of 05/20/2016.       Review of Systems  Constitutional: Negative.   HENT: Negative.   Eyes: Negative.   Respiratory: Negative.   Cardiovascular: Negative.   Gastrointestinal: Negative.   Endocrine: Negative.   Genitourinary: Negative.   Musculoskeletal: Positive for arthralgias (left shoulder pain).  Skin: Negative.   Allergic/Immunologic: Negative.   Neurological: Negative.   Hematological: Negative.   Psychiatric/Behavioral: Negative.        Objective:   Physical Exam  Constitutional: He is oriented to person, place, and time. He appears well-developed and well-nourished. No distress.  HENT:  Head: Normocephalic and atraumatic.  Right Ear: External ear normal.  Left Ear: External ear normal.  Nose: Nose normal.  Mouth/Throat: Oropharynx is clear and moist. No oropharyngeal exudate.  Eyes: Conjunctivae and EOM are normal. Pupils are equal, round, and reactive to light. Right eye exhibits no discharge. Left eye exhibits no discharge. No scleral icterus.  Neck: Normal range of motion. Neck supple. No thyromegaly present.    Cardiovascular: Normal rate, regular rhythm, normal heart sounds and intact distal pulses.   No murmur heard. Heart is regular at 60/m  Pulmonary/Chest: Effort normal and breath sounds normal. No respiratory distress. He has no wheezes. He has no rales. He exhibits no tenderness.  No axillary adenopathy  Abdominal: Soft. Bowel sounds are normal. He exhibits no mass. There is no tenderness. There is no rebound and no guarding.  No liver or spleen enlargement masses or inguinal adenopathy  Musculoskeletal: Normal range of motion. He exhibits no edema.  The patient continues to have pain at the left acromioclavicular joint. The previous x-ray shows some mild arthritis at this joint. The patient was injected with a half of a cc of Marcaine and a half of a cc of cortisone. He tolerated the procedure well and following the injection shoulder felt better. I told him this meant that the medicine got to the right spot. He will call us back if he does not continue to get better.  Lymphadenopathy:    He has no cervical adenopathy.  Neurological: He is alert and oriented to person, place, and time. He has normal reflexes. No cranial nerve deficit.  Skin: Skin is warm and dry. No rash noted.  Psychiatric: He has a normal mood and affect. His behavior is normal. Judgment and thought content normal.  Nursing note and vitals reviewed.   BP 115/61 (BP Location: Right Arm)   Pulse 62   Temp 97.7 F (36.5 C) (Oral)   Ht _0  (1.753 m)   Wt 174 lb (78.9 kg)   BMI 25.70 kg/m        Assessment & Plan:  1. Essential hypertension -The blood pressure is good today and he will continue with current treatment - CBC with Differential/Platelet; Future - Hepatic function panel; Future - BMP8+EGFR; Future - DG Chest 2 View; Future  2. Type 2 diabetes mellitus without complication, without long-term current use of insulin ([place]) -Continue with current treatment pending results of lab work - CBC with  Differential/Platelet; Future - BMP8+EGFR; Future - DG Chest 2 View; Future  3. Gastroesophageal reflux disease, esophagitis presence not specified -The patient did not do well with ranitidine and is back on Nexium and this seems to control his reflux he will continue this. - CBC with Differential/Platelet; Future - Hepatic function panel; Future  4. Vitamin D deficiency -Continue with current treatment pending results of lab work - CBC with Differential/Platelet; Future - VITAMIN D 25 Hydroxy (Vit-D Deficiency, Fractures); Future  5. Pure hypercholesterolemia -Continue with current treatment and aggressive therapeutic lifestyle changes - CBC with Differential/Platelet; Future - NMR, lipoprofile; Future - DG Chest 2 View; Future  6. Thrombocytopenia -Patient denies any bleeding issues. He has had low platelet counts. - CBC with Differential/Platelet; Future  7. Left shoulder pain, unspecified chronicity -The patient per x-ray has mild arthritis of the left acromioclavicular joint. -Joint was injected with Marcaine and cortisone without problems   Meds ordered this encounter  Medications  . DISCONTD: empagliflozin (JARDIANCE) 10 MG TABS tablet    Sig: Take 10 mg by mouth daily.   Patient Instructions  Medicare Annual Wellness Visit  [facility] and the medical providers at [facility] strive to bring you the best medical care.  In doing so we not only want to address your current medical conditions and concerns but also to detect new conditions early and prevent illness, disease and health-related problems.    Medicare offers a yearly Wellness Visit which allows our clinical staff to assess your need for preventative services including immunizations, lifestyle education, counseling to decrease risk of preventable diseases and screening for fall risk and other medical concerns.    This visit is provided free of charge (no copay) for  all Medicare recipients. The clinical pharmacists at [facility] have begun to conduct these Wellness Visits which will also include a thorough review of all your medications.    As you primary medical provider recommend that you make an appointment for your Annual Wellness Visit if you have not done so already this year.  You may set up this appointment before you leave today or you may call back [phone]) and schedule an appointment.  Please make sure when you call that you mention that you are scheduling your Annual Wellness Visit with the clinical pharmacist so that the appointment may be made for the proper length of time.     Continue current medications. Continue good therapeutic lifestyle changes which include good diet and exercise. Fall precautions discussed with patient. If an FOBT was given today- please return it to our front desk. If you are over 26 years old - you may need Prevnar 79 or the adult Pneumonia vaccine.  **Flu shots are available--- please call and schedule a FLU-CLINIC appointment**  After your visit with [place] today you will receive a survey in the mail or online from [employer] regarding your care with [place]. Please take a moment to fill this out. Your feedback is very important to [place] as you can help [place] better understand your patient needs as well as improve your experience and satisfaction. WE CARE ABOUT YOU!!!   Continue current meds and follow up as directed Call us if the shoulder doesn't get better.   Arrie Senate MD

## 2016-05-26 ENCOUNTER — Emergency Department (HOSPITAL_COMMUNITY): Payer: Medicare Other

## 2016-05-26 ENCOUNTER — Emergency Department (HOSPITAL_COMMUNITY)
Admission: EM | Admit: 2016-05-26 | Discharge: 2016-05-26 | Disposition: A | Discharge status: Home / Self Care | Payer: Medicare Other | Attending: Emergency Medicine | Admitting: Emergency Medicine

## 2016-05-26 ENCOUNTER — Encounter (HOSPITAL_COMMUNITY): Payer: Self-pay | Admitting: Emergency Medicine

## 2016-05-26 DIAGNOSIS — E1169 Type 2 diabetes mellitus with other specified complication: Secondary | ICD-10-CM | POA: Diagnosis present

## 2016-05-26 DIAGNOSIS — R079 Chest pain, unspecified: Secondary | ICD-10-CM | POA: Diagnosis not present

## 2016-05-26 DIAGNOSIS — I2510 Atherosclerotic heart disease of native coronary artery without angina pectoris: Secondary | ICD-10-CM

## 2016-05-26 DIAGNOSIS — Z79899 Other long term (current) drug therapy: Secondary | ICD-10-CM | POA: Diagnosis not present

## 2016-05-26 DIAGNOSIS — E1159 Type 2 diabetes mellitus with other circulatory complications: Secondary | ICD-10-CM | POA: Diagnosis present

## 2016-05-26 DIAGNOSIS — Z7984 Long term (current) use of oral hypoglycemic drugs: Secondary | ICD-10-CM | POA: Diagnosis not present

## 2016-05-26 DIAGNOSIS — Z7982 Long term (current) use of aspirin: Secondary | ICD-10-CM | POA: Insufficient documentation

## 2016-05-26 DIAGNOSIS — I152 Hypertension secondary to endocrine disorders: Secondary | ICD-10-CM | POA: Diagnosis present

## 2016-05-26 DIAGNOSIS — Z87891 Personal history of nicotine dependence: Secondary | ICD-10-CM | POA: Diagnosis not present

## 2016-05-26 DIAGNOSIS — I2584 Coronary atherosclerosis due to calcified coronary lesion: Secondary | ICD-10-CM | POA: Diagnosis not present

## 2016-05-26 DIAGNOSIS — Z049 Encounter for examination and observation for unspecified reason: Secondary | ICD-10-CM | POA: Diagnosis not present

## 2016-05-26 DIAGNOSIS — E119 Type 2 diabetes mellitus without complications: Secondary | ICD-10-CM | POA: Diagnosis not present

## 2016-05-26 DIAGNOSIS — R0789 Other chest pain: Secondary | ICD-10-CM

## 2016-05-26 DIAGNOSIS — I4510 Unspecified right bundle-branch block: Secondary | ICD-10-CM | POA: Diagnosis present

## 2016-05-26 DIAGNOSIS — I10 Essential (primary) hypertension: Secondary | ICD-10-CM | POA: Diagnosis not present

## 2016-05-26 DIAGNOSIS — E785 Hyperlipidemia, unspecified: Secondary | ICD-10-CM | POA: Diagnosis present

## 2016-05-26 HISTORY — DX: Chest pain, unspecified: R07.9

## 2016-05-26 LAB — I-STAT TROPONIN, ED: TROPONIN I, POC: 0 ng/mL (ref 0.00–0.08)

## 2016-05-26 LAB — CBC WITH DIFFERENTIAL/PLATELET
BASOS PCT: 0 %
Basophils Absolute: 0 10*3/uL (ref 0.0–0.1)
EOS ABS: 0.4 10*3/uL (ref 0.0–0.7)
Eosinophils Relative: 5 %
HCT: 32.4 % — ABNORMAL LOW (ref 39.0–52.0)
HEMOGLOBIN: 11.8 g/dL — AB (ref 13.0–17.0)
Lymphocytes Relative: 13 %
Lymphs Abs: 1 10*3/uL (ref 0.7–4.0)
MCH: 33.3 pg (ref 26.0–34.0)
MCHC: 36.4 g/dL — AB (ref 30.0–36.0)
MCV: 91.5 fL (ref 78.0–100.0)
MONOS PCT: 7 %
Monocytes Absolute: 0.6 10*3/uL (ref 0.1–1.0)
NEUTROS PCT: 75 %
Neutro Abs: 6 10*3/uL (ref 1.7–7.7)
PLATELETS: 124 10*3/uL — AB (ref 150–400)
RBC: 3.54 MIL/uL — ABNORMAL LOW (ref 4.22–5.81)
RDW: 12.9 % (ref 11.5–15.5)
WBC: 8 10*3/uL (ref 4.0–10.5)

## 2016-05-26 LAB — PROTIME-INR
INR: 1.04
Prothrombin Time: 13.7 seconds (ref 11.4–15.2)

## 2016-05-26 LAB — COMPREHENSIVE METABOLIC PANEL
ALBUMIN: 4.1 g/dL (ref 3.5–5.0)
ALK PHOS: 42 U/L (ref 38–126)
ALT: 24 U/L (ref 17–63)
ANION GAP: 9 (ref 5–15)
AST: 26 U/L (ref 15–41)
BUN: 22 mg/dL — ABNORMAL HIGH (ref 6–20)
CALCIUM: 9.3 mg/dL (ref 8.9–10.3)
CHLORIDE: 104 mmol/L (ref 101–111)
CO2: 23 mmol/L (ref 22–32)
Creatinine, Ser: 1.29 mg/dL — ABNORMAL HIGH (ref 0.61–1.24)
GFR calc Af Amer: >60 mL/min (ref >60)
GFR calc non Af Amer: 53 mL/min — ABNORMAL LOW (ref >60)
GLUCOSE: 177 mg/dL — AB (ref 65–99)
POTASSIUM: 4.2 mmol/L (ref 3.5–5.1)
SODIUM: 136 mmol/L (ref 135–145)
Total Bilirubin: 0.9 mg/dL (ref 0.3–1.2)
Total Protein: 6.5 g/dL (ref 6.5–8.1)

## 2016-05-26 LAB — TROPONIN I: Troponin I: <0.03 ng/mL (ref <0.03)

## 2016-05-26 LAB — APTT: APTT: 29 s (ref 24–36)

## 2016-05-26 LAB — LIPID PANEL
Cholesterol: 121 mg/dL (ref 0–200)
HDL: 43 mg/dL (ref >40)
LDL Cholesterol: 58 mg/dL (ref 0–99)
Total CHOL/HDL Ratio: 2.8 RATIO
Triglycerides: 98 mg/dL (ref <150)
VLDL: 20 mg/dL (ref 0–40)

## 2016-05-26 LAB — D-DIMER, QUANTITATIVE (NOT AT ARMC): D DIMER QUANT: 0.32 ug{FEU}/mL (ref 0.00–0.50)

## 2016-05-26 MED ORDER — ASPIRIN 81 MG PO CHEW: 324.0000 mg | CHEWABLE_TABLET | Freq: Once | ORAL | Status: DC

## 2016-05-26 MED ORDER — SODIUM CHLORIDE 0.9 % IV SOLN
10.0000 mL/h | INTRAVENOUS | Status: DC
  Administered 2016-05-26: 20 mL/h via INTRAVENOUS

## 2016-05-26 MED ORDER — HEPARIN BOLUS VIA INFUSION
4000.0000 [IU] | Freq: Once | INTRAVENOUS | Status: AC
  Administered 2016-05-26: 4000 [IU] via INTRAVENOUS
  Filled 2016-05-26: qty 4000

## 2016-05-26 MED ORDER — HEPARIN (PORCINE) IN NACL 100-0.45 UNIT/ML-% IJ SOLN
950.0000 [IU]/h | INTRAMUSCULAR | Status: DC
  Administered 2016-05-26: 950 [IU]/h via INTRAVENOUS
  Filled 2016-05-26: qty 250

## 2016-05-26 NOTE — ED Provider Notes (Signed)
8:22 AM Care assumed from Dr. Wyvonnia Dusky.   At time of transfer of care, patient is awaiting cardiology reevaluation and recommendations. Patient is awaiting a second troponin at this time.  Cardiology evaluated the patient and felt that the patient will be safe for discharge. Second troponin came back negative. They feel patient's pain is likely secondary to musculoskeletal injury lifting heavy water bottles previously.  Patient assessed by me and had improvement in pain. No shortness of breath. Lungs were clear. No other problems on exam.   Given cardiology recommendations, patient will be discharged and he clearly understands return precautions. Patient will follow-up with his cardiologist and PCP. Patient understood return for any new or worsening symptoms. Patient discharged in good condition.     Clinical Impression: 1. Nonspecific chest pain   2. Chest pain     Disposition: Discharge  Condition: Good  I have discussed the results, Dx and Tx plan with the pt(& family if present). He/she/they expressed understanding and agree(s) with the plan. Discharge instructions discussed at great length. Strict return precautions discussed and pt &/or family have verbalized understanding of the instructions. No further questions at time of discharge.    New Prescriptions   No medications on file    Follow Up: Chipper Herb, MD [place] [id number] [phone]  Schedule an appointment as soon as possible for a visit    [facility] [address] [id number] [place] [phone]  If symptoms worsen      Courtney Paris, MD 05/26/16 2007

## 2016-05-26 NOTE — Discharge Instructions (Signed)
Please follow-up with your PCP and cardiologist for further workup of your chest pain. Cardiology team that saw you today felt you are safe for discharge. If any symptoms return or worsen, please return to the nearest emergency department.

## 2016-05-26 NOTE — ED Notes (Signed)
Called for troponin level.  Not resulted yet.

## 2016-05-26 NOTE — ED Provider Notes (Signed)
[place] DEPT Provider Note   CSN: [id number] Arrival date & time: 05/26/16  [id number]  By signing my name below, I, Joe [name], attest that this documentation has been prepared under the direction and in the presence of Joe Essex, MD. Electronically Signed: Hilbert [name], Scribe. 05/26/16. 3:57 AM. History   Chief Complaint Chief Complaint  Patient presents with  . Chest Pain    The history is provided by the patient and a relative. No language interpreter was used.   HPI Comments: Joe [name] is a 75 y.o. male with a history of DM and HTN, who presents to the Emergency Department via EMS, complaining of constant, progressively worsening CP that began on 05/24/16. He admits to CP upon exertion. He denies SOB, near syncopal episode, and diaphoresis. He denies hx of cardiac stents or CAD. Pt was given NTG and morphine PTA with moderate relief.  Pt took 4 baby ASA prior to EMS arrival. Family reports that pt had a nuclear stress test on 03/26/16 which was normal.   Past Medical History:  Diagnosis Date  . Arthritis   . Cataract   . Colon polyps   . Diabetes mellitus without complication ([place])   . Dyslipidemia   . ESOPHAGITIS, REFLUX 07/09/2004   Qualifier: Diagnosis of  By: Hardin Negus CMA (AAMA), [place]    . Hiatal hernia   . HOH (hard of hearing)    has bilateral Hearing aids  . Hypertension   . Irritable bowel syndrome   . Kidney stone 1970  . Pneumonia   . Tick bites    took 5 rounds of Doxycycline this summer    Patient Active Problem List   Diagnosis Date Noted  . Thrombocytopenia ([place]) 03/18/2015  . Sebaceous cyst 01/23/2015  . Atopic dermatitis 01/23/2015  . Foot lesion 01/16/2015  . Thoracic disc herniation 06/20/2014  . Left varicocele 06/20/2014  . Abdominal aortic atherosclerosis ([place]) 06/20/2014  . Ventral hernia without obstruction or gangrene 02/04/2014  . Personal history of colonic polyps 02/04/2014  . Hyperlipemia 12/30/2012  .  Hypertension 12/30/2012  . BPH (benign prostatic hypertrophy) 12/30/2012  . Diabetes type 2, controlled ([place]) 09/28/2012  . RBBB 04/11/2010  . CARDIOVASCULAR FUNCTION STUDY, ABNORMAL 04/11/2010  . Diaphragmatic hernia 09/14/2007  . NEPHROLITHIASIS 09/14/2007    Past Surgical History:  Procedure Laterality Date  . COLONOSCOPY    . ESOPHAGEAL DILATION  1996  . ESOPHAGEAL DILATION  2003  . FLEXIBLE SIGMOIDOSCOPY    . HEMICOLECTOMY  08/2005   Right  . KNEE SURGERY Right   . TONSILLECTOMY         Home Medications    Prior to Admission medications   Medication Sig Start Date End Date Taking? Authorizing Provider  aspirin 81 MG tablet Take 81 mg by mouth daily.      Historical Provider, MD  cholecalciferol (VITAMIN D) 1000 UNITS tablet Take 2,000 Units by mouth daily.     Historical Provider, MD  Cinnamon 500 MG TABS Take 1,000 tablets by mouth 2 (two) times daily.    Historical Provider, MD  doxylamine, Sleep, (UNISOM) 25 MG tablet Take 25 mg by mouth at bedtime as needed.    Historical Provider, MD  empagliflozin (JARDIANCE) 10 MG TABS tablet Take 10 mg by mouth daily. 05/20/16   Chipper Herb, MD  esomeprazole (NEXIUM) 40 MG capsule TAKE (1) CAPSULE DAILY 04/22/16   Chipper Herb, MD  glucose blood (ONE TOUCH ULTRA TEST) test strip Check BS qid  Dx: E11.9  06/28/15   Chipper Herb, MD  JANUVIA 100 MG tablet TAKE 1 TABLET DAILY 03/21/16   Chipper Herb, MD  Melatonin 5 MG TABS Take 1 tablet by mouth daily.      Historical Provider, MD  metFORMIN (GLUCOPHAGE) 1000 MG tablet Take 1 and 1/2 tablets each morning and 1 tablet each evening. 05/20/16   Chipper Herb, MD  mupirocin ointment (BACTROBAN) 2 % Apply 1 application topically 2 (two) times daily. 04/04/16   Chipper Herb, MD  ONE TOUCH ULTRA TEST test strip CHECK BLOOD SUGAR 4 TIMES A DAY 05/20/16   Chipper Herb, MD  [facility] LANCETS [id number] MISC  09/22/12   Historical Provider, MD  simvastatin (ZOCOR) 40 MG tablet TAKE 1  TABLET EVERY EVENING 02/21/16   Chipper Herb, MD  valsartan-hydrochlorothiazide (DIOVAN-HCT) 160-12.5 MG tablet Take 1 tablet by mouth daily.    Historical Provider, MD    Family History Family History  Problem Relation Age of Onset  . Stroke Father 52  . Hyperlipidemia Father   . Hypertension Father   . Heart disease Brother   . Cancer Brother     lung   . Hyperlipidemia Brother   . Hypertension Brother   . Stroke Brother   . Diabetes Brother   . Breast cancer Mother   . Colon cancer Neg Hx   . Colon polyps Neg Hx   . Kidney disease Neg Hx   . Esophageal cancer Neg Hx   . Stomach cancer Neg Hx   . Rectal cancer Neg Hx     Social History Social History  Substance Use Topics  . Smoking status: Former Smoker    Packs/day: 2.00    Types: Cigarettes    Start date: 06/03/1954    Quit date: 08/24/1984  . Smokeless tobacco: Never Used     Comment: Has not used to tobacco products for the past 20 years  . Alcohol use 2.5 oz/week    5 Standard drinks or equivalent per week     Comment: Occassionally     Allergies   Ace inhibitors and Trazamine [trazodone & diet manage prod]   Review of Systems Review of Systems 10 systems reviewed and all are negative for acute change except as noted in the HPI.  Physical Exam Updated Vital Signs There were no vitals taken for this visit.  Physical Exam  Constitutional: He is oriented to person, place, and time. He appears well-developed and well-nourished. No distress.  HENT:  Head: Normocephalic and atraumatic.  Mouth/Throat: Oropharynx is clear and moist. No oropharyngeal exudate.  Eyes: Conjunctivae and EOM are normal. Pupils are equal, round, and reactive to light.  Neck: Normal range of motion. Neck supple.  No meningismus.  Cardiovascular: Normal rate, regular rhythm, normal heart sounds and intact distal pulses.   No murmur heard. Pulmonary/Chest: Effort normal and breath sounds normal. No respiratory distress. He exhibits  no tenderness.  Abdominal: Soft. There is no tenderness. There is no rebound and no guarding.  Musculoskeletal: Normal range of motion. He exhibits no edema or tenderness.  Neurological: He is alert and oriented to person, place, and time. No cranial nerve deficit. He exhibits normal muscle tone. Coordination normal.  No ataxia on finger to nose bilaterally. No pronator drift. 5/5 strength throughout. CN 2-12 intact.Equal grip strength. Sensation intact.   Skin: Skin is warm.  Psychiatric: He has a normal mood and affect. His behavior is normal.  Nursing note and vitals reviewed.  ED Treatments / Results  DIAGNOSTIC STUDIES: Oxygen Saturation is 97% on RA, normal by my interpretation.    COORDINATION OF CARE: 3:40 AM Discussed treatment plan with pt at bedside and pt agreed to plan.  Labs (all labs ordered are listed, but only abnormal results are displayed) Labs Reviewed  CBC WITH DIFFERENTIAL/PLATELET - Abnormal; Notable for the following:       Result Value   RBC 3.54 (*)    Hemoglobin 11.8 (*)    HCT 32.4 (*)    MCHC 36.4 (*)    Platelets 124 (*)    All other components within normal limits  COMPREHENSIVE METABOLIC PANEL - Abnormal; Notable for the following:    Glucose, Bld 177 (*)    BUN 22 (*)    Creatinine, Ser 1.29 (*)    GFR calc non Af Amer 53 (*)    All other components within normal limits  TROPONIN I  PROTIME-INR  APTT  LIPID PANEL  D-DIMER, QUANTITATIVE (NOT AT [facility])  TROPONIN I  CBC  DIFFERENTIAL  HEPARIN LEVEL (UNFRACTIONATED)  I-STAT TROPOININ, ED    EKG  EKG Interpretation  Date/Time:  Sunday May 26 2016 03:28:45 EST Ventricular Rate:  65 PR Interval:    QRS Duration: 135 QT Interval:  408 QTC Calculation: 425 R Axis:   50 Text Interpretation:  Sinus rhythm Right bundle branch block Inferior infarct, acute Lateral leads are also involved Baseline wander in lead(s) V3 ST elevation consider inferior injury or acute infarct d/w Dr.  Burt Knack Confirmed by Wyvonnia Dusky  MD, [place] [phone]) on 05/26/2016 4:34:48 AM       Radiology Dg Chest Port 1 View  Result Date: 05/26/2016 CLINICAL DATA:  Progressively worsening chest pain constant since 05/24/2016, chest pain with exertion, history diabetes mellitus, hypertension EXAM: PORTABLE CHEST 1 VIEW COMPARISON:  Portable exam 0346 hours compared to 05/20/2016 FINDINGS: Normal heart size, mediastinal contours, and pulmonary vascularity. Atherosclerotic calcification aorta. Lungs clear. No pleural effusion or pneumothorax. No acute osseous findings. IMPRESSION: No acute abnormalities. Aortic atherosclerosis. Electronically Signed   By: Lavonia Dana M.D.   On: 05/26/2016 07:43    Procedures Procedures (including critical care time)  Medications Ordered in ED Medications - No data to display   Initial Impression / Assessment and Plan / ED Course  I have reviewed the triage vital signs and the nursing notes.  Pertinent labs & imaging results that were available during my care of the patient were reviewed by me and considered in my medical decision making (see chart for details).  Clinical Course    Patient presents with a three-day history of constant upper chest pain that does not wax or wane. Denies shortness of breath, nausea, vomiting. It started after he lifted several cases of water the day before.  Initial EKG shows right bundle branch block with subtle ST elevation inferiorly. Discussed with Dr. Burt Knack of cardiology. He reviewed the EKG. In light of patient's ongoing pain for the past 48 hours as well as recent negative stress test in October, will not plan catheterization unless troponin is positive.  Troponin is negative. Will add d-dimer given pleuritic nature of pain. Patient remains pain-free after nitroglycerin and morphine. It is worse with deep breathing.  D-dimer negative. CXR negative. Per overnight cardiology fellow, he suspects patient has MSK chest pain and can  be discharged if second troponin negative. However, he did not write note.  Patient remains CP free.  D/w Dr. Domenic Polite who will evaluate. Second troponin pending.  Dr. Sherry Ruffing to assume care at shift change.     Final Clinical Impressions(s) / ED Diagnoses   Final diagnoses:  Nonspecific chest pain    New Prescriptions New Prescriptions   No medications on file  I personally performed the services described in this documentation, which was scribed in my presence. The recorded information has been reviewed and is accurate.    Joe Essex, MD 05/26/16 [id number]

## 2016-05-26 NOTE — ED Triage Notes (Signed)
BIB [place] [facility] for CP that started  Friday.  It woke him up.  Then he woke up yesterday AM with same pain.  Couldn't sleep tonight and called EMS.  4/10 substernal chest soreness.  He sts he lifted 4 40-bottle pallets of water, but he works out 3 times a week, activity not new.  Hx DM, Hypertension, elevated cholesterol, GERD.  Took 4 baby ASA prior to EMS arrival.  EMS gave him 2 nitro sprays, and 4mg  morphine. This brings pain down to 1/10 at rest, 4/10 remains with inspiration.

## 2016-05-26 NOTE — Progress Notes (Signed)
ANTICOAGULATION CONSULT NOTE - Initial Consult  Pharmacy Consult for Heparin Indication: chest pain/ACS  Allergies  Allergen Reactions  . Ace Inhibitors Cough  . Trazamine [Trazodone & Diet Manage Prod] Other (See Comments)    nightmares    Patient Measurements: Height: 5\' 9"  (175.3 cm) Weight: 174 lb (78.9 kg) IBW/kg (Calculated) : 70.7  Vital Signs: Temp: 98.3 F (36.8 C) (12/24 0330) Temp Source: Oral (12/24 0330) BP: 127/79 (12/24 0400) Pulse Rate: 68 (12/24 0400)  Labs: No results for input(s): HGB, HCT, PLT, APTT, LABPROT, INR, HEPARINUNFRC, HEPRLOWMOCWT, CREATININE, CKTOTAL, CKMB, TROPONINI in the last 72 hours.  CrCl cannot be calculated (Patient's most recent lab result is older than the maximum 21 days allowed.).   Medical History: Past Medical History:  Diagnosis Date  . Arthritis   . Cataract   . Colon polyps   . Diabetes mellitus without complication ([place])   . Dyslipidemia   . ESOPHAGITIS, REFLUX 07/09/2004   Qualifier: Diagnosis of  By: Hardin Negus CMA (AAMA), [place]    . Hiatal hernia   . HOH (hard of hearing)    has bilateral Hearing aids  . Hypertension   . Irritable bowel syndrome   . Kidney stone 1970  . Pneumonia   . Tick bites    took 5 rounds of Doxycycline this summer    Medications:  No current facility-administered medications on file prior to encounter.    Current Outpatient Prescriptions on File Prior to Encounter  Medication Sig Dispense Refill  . aspirin 81 MG tablet Take 81 mg by mouth daily.      . cholecalciferol (VITAMIN D) 1000 UNITS tablet Take 2,000 Units by mouth daily.     . Cinnamon 500 MG TABS Take 1,000 tablets by mouth 2 (two) times daily.    Marland Kitchen doxylamine, Sleep, (UNISOM) 25 MG tablet Take 25 mg by mouth at bedtime as needed.    . empagliflozin (JARDIANCE) 10 MG TABS tablet Take 10 mg by mouth daily. 30 tablet 2  . esomeprazole (NEXIUM) 40 MG capsule TAKE (1) CAPSULE DAILY 30 capsule 3  . glucose blood (ONE TOUCH  ULTRA TEST) test strip Check BS qid  Dx: E11.9 100 each 11  . JANUVIA 100 MG tablet TAKE 1 TABLET DAILY 30 tablet 5  . Melatonin 5 MG TABS Take 1 tablet by mouth daily.      . metFORMIN (GLUCOPHAGE) 1000 MG tablet Take 1 and 1/2 tablets each morning and 1 tablet each evening. 75 tablet 0  . mupirocin ointment (BACTROBAN) 2 % Apply 1 application topically 2 (two) times daily. 22 g 1  . ONE TOUCH ULTRA TEST test strip CHECK BLOOD SUGAR 4 TIMES A DAY 100 each 0  . ONETOUCH DELICA LANCETS [id number] MISC     . simvastatin (ZOCOR) 40 MG tablet TAKE 1 TABLET EVERY EVENING 30 tablet 5  . valsartan-hydrochlorothiazide (DIOVAN-HCT) 160-12.5 MG tablet Take 1 tablet by mouth daily.       Assessment: 75 y.o. male with chest pain for heparin   Goal of Therapy:  Heparin level 0.3-0.7 units/ml Monitor platelets by anticoagulation protocol: Yes   Plan:  Heparin 4000 units IV bolus, then start heparin 950 units/hr Check heparin level in 8 hours.   Caryl Pina 05/26/2016,4:11 AM

## 2016-05-26 NOTE — H&P (Signed)
Joe [name] is an 75 y.o. male.    Primary Cardiologist: Dr. Percival Spanish  PCP: Redge Gainer, MD  Chief Complaint: chest pain   HPI:  24 yom with hx of HTN, RBBB hx of abnormal ETT but neg nuc study in October 2017 after cardiac CT with coronary Ca+ score of 207.  There is a mention in the chart of aortic atherosclerosis on lumbar xray.  He has DM-2, dyslipidemia, HTN and FH heart disease.    Today presents to ER after developing chest pain yesterday.  Lt ant chest pain without radiation, no nausea, SOB or diaphoresis.  Increased with movement and deep breath.  He did not take anything and when he went to bed pain became more intense and his BP increased.  He called EMS and NTG did not help the pain but did cause a headache.  Morphine helped the pain.  Now pain only with movement.  But not all movement and mostly with deep breath.  He did pick up a case of water yesterday which he now believes caused the pain.    Troponin poc neg.  And first troponin neg.  Second troponin also normal.   D-dimer is negative.  Lipids controlled with LDL 58. Cr. 1.29 which is stable. Lytes and hepatic normal.   EKG with RBBB and abnormal T waves.   CXR with no acute abnormalities and aortic atherosclerosis.    Past Medical History:  Diagnosis Date  . Arthritis   . Cataract   . Chest pain at rest 05/26/2016  . Colon polyps   . Diabetes mellitus without complication ([place])   . Dyslipidemia   . ESOPHAGITIS, REFLUX 07/09/2004   Qualifier: Diagnosis of  By: Hardin Negus CMA (AAMA), [place]    . Hiatal hernia   . HOH (hard of hearing)    has bilateral Hearing aids  . Hypertension   . Irritable bowel syndrome   . Kidney stone 1970  . Pneumonia   . Tick bites    took 5 rounds of Doxycycline this summer    Past Surgical History:  Procedure Laterality Date  . COLONOSCOPY    . ESOPHAGEAL DILATION  1996  . ESOPHAGEAL DILATION  2003  . FLEXIBLE SIGMOIDOSCOPY    . HEMICOLECTOMY  08/2005   Right  . KNEE SURGERY Right   . TONSILLECTOMY      Family History  Problem Relation Age of Onset  . Stroke Father 27  . Hyperlipidemia Father   . Hypertension Father   . Heart disease Brother   . Cancer Brother     lung   . Hyperlipidemia Brother   . Hypertension Brother   . Stroke Brother   . Diabetes Brother   . Breast cancer Mother   . Colon cancer Neg Hx   . Colon polyps Neg Hx   . Kidney disease Neg Hx   . Esophageal cancer Neg Hx   . Stomach cancer Neg Hx   . Rectal cancer Neg Hx    Social History:  reports that he quit smoking about 31 years ago. His smoking use included Cigarettes. He started smoking about 62 years ago. He smoked 2.00 packs per day. He has never used smokeless tobacco. He reports that he drinks about 2.5 oz of alcohol per week . He reports that he does not use drugs.  Allergies:  Allergies  Allergen Reactions  . Ace Inhibitors Cough  . Trazamine [Trazodone & Diet Manage Prod] Other (See  Comments)    nightmares    OUTPATIENT MEDICATIONS: No current facility-administered medications on file prior to encounter.    Current Outpatient Prescriptions on File Prior to Encounter  Medication Sig Dispense Refill  . aspirin 81 MG tablet Take 81 mg by mouth daily.      . cholecalciferol (VITAMIN D) 1000 UNITS tablet Take 2,000 Units by mouth daily.     . Cinnamon 500 MG TABS Take 1,000 tablets by mouth 2 (two) times daily.    Marland Kitchen doxylamine, Sleep, (UNISOM) 25 MG tablet Take 25 mg by mouth at bedtime as needed for sleep.     . empagliflozin (JARDIANCE) 10 MG TABS tablet Take 10 mg by mouth daily. 30 tablet 2  . esomeprazole (NEXIUM) 40 MG capsule TAKE (1) CAPSULE DAILY 30 capsule 3  . glucose blood (ONE TOUCH ULTRA TEST) test strip Check BS qid  Dx: E11.9 100 each 11  . JANUVIA 100 MG tablet TAKE 1 TABLET DAILY 30 tablet 5  . metFORMIN (GLUCOPHAGE) 1000 MG tablet Take 1 and 1/2 tablets each morning and 1 tablet each evening. (Patient taking differently: Take  1 tablet by mouth twice daily) 75 tablet 0  . mupirocin ointment (BACTROBAN) 2 % Apply 1 application topically 2 (two) times daily. (Patient taking differently: Apply 1 application topically 2 (two) times daily as needed (rash). ) 22 g 1  . ONE TOUCH ULTRA TEST test strip CHECK BLOOD SUGAR 4 TIMES A DAY 100 each 0  . simvastatin (ZOCOR) 40 MG tablet TAKE 1 TABLET EVERY EVENING 30 tablet 5  . valsartan-hydrochlorothiazide (DIOVAN-HCT) 160-12.5 MG tablet Take 1 tablet by mouth daily.       Results for orders placed or performed during the hospital encounter of 05/26/16 (from the past 48 hour(s))  CBC with Differential/Platelet     Status: Abnormal   Collection Time: 05/26/16  3:49 AM  Result Value Ref Range   WBC 8.0 4.0 - 10.5 K/uL   RBC 3.54 (L) 4.22 - 5.81 MIL/uL   Hemoglobin 11.8 (L) 13.0 - 17.0 g/dL   HCT 32.4 (L) 39.0 - 52.0 %   MCV 91.5 78.0 - 100.0 fL   MCH 33.3 26.0 - 34.0 pg   MCHC 36.4 (H) 30.0 - 36.0 g/dL   RDW 12.9 11.5 - 15.5 %   Platelets 124 (L) 150 - 400 K/uL   Neutrophils Relative % 75 %   Neutro Abs 6.0 1.7 - 7.7 K/uL   Lymphocytes Relative 13 %   Lymphs Abs 1.0 0.7 - 4.0 K/uL   Monocytes Relative 7 %   Monocytes Absolute 0.6 0.1 - 1.0 K/uL   Eosinophils Relative 5 %   Eosinophils Absolute 0.4 0.0 - 0.7 K/uL   Basophils Relative 0 %   Basophils Absolute 0.0 0.0 - 0.1 K/uL  Comprehensive metabolic panel     Status: Abnormal   Collection Time: 05/26/16  3:49 AM  Result Value Ref Range   Sodium 136 135 - 145 mmol/L   Potassium 4.2 3.5 - 5.1 mmol/L   Chloride 104 101 - 111 mmol/L   CO2 23 22 - 32 mmol/L   Glucose, Bld 177 (H) 65 - 99 mg/dL   BUN 22 (H) 6 - 20 mg/dL   Creatinine, Ser 1.29 (H) 0.61 - 1.24 mg/dL   Calcium 9.3 8.9 - 10.3 mg/dL   Total Protein 6.5 6.5 - 8.1 g/dL   Albumin 4.1 3.5 - 5.0 g/dL   AST 26 15 - 41 U/L  ALT 24 17 - 63 U/L   Alkaline Phosphatase 42 38 - 126 U/L   Total Bilirubin 0.9 0.3 - 1.2 mg/dL   GFR calc non Af Amer 53 (L) >60  mL/min   GFR calc Af Amer >60 >60 mL/min    Comment: (NOTE) The eGFR has been calculated using the CKD EPI equation. This calculation has not been validated in all clinical situations. eGFR's persistently <60 mL/min signify possible Chronic Kidney Disease.    Anion gap 9 5 - 15  Troponin I     Status: None   Collection Time: 05/26/16  3:49 AM  Result Value Ref Range   Troponin I <0.03 <0.03 ng/mL  Protime-INR     Status: None   Collection Time: 05/26/16  3:49 AM  Result Value Ref Range   Prothrombin Time 13.7 11.4 - 15.2 seconds   INR 1.04   APTT     Status: None   Collection Time: 05/26/16  3:49 AM  Result Value Ref Range   aPTT 29 24 - 36 seconds  Lipid panel     Status: None   Collection Time: 05/26/16  3:49 AM  Result Value Ref Range   Cholesterol 121 0 - 200 mg/dL   Triglycerides 98 <150 mg/dL   HDL 43 >40 mg/dL   Total CHOL/HDL Ratio 2.8 RATIO   VLDL 20 0 - 40 mg/dL   LDL Cholesterol 58 0 - 99 mg/dL    Comment:        Total Cholesterol/HDL:CHD Risk Coronary Heart Disease Risk Table                     Men   Women  1/2 Average Risk   3.4   3.3  Average Risk       5.0   4.4  2 X Average Risk   9.6   7.1  3 X Average Risk  23.4   11.0        Use the calculated Patient Ratio above and the CHD Risk Table to determine the patient's CHD Risk.        ATP III CLASSIFICATION (LDL):  <100     mg/dL   Optimal  100-129  mg/dL   Near or Above                    Optimal  130-159  mg/dL   Borderline  160-189  mg/dL   High  >190     mg/dL   Very High   D-dimer, quantitative (not at [facility])     Status: None   Collection Time: 05/26/16  3:49 AM  Result Value Ref Range   D-Dimer, Quant 0.32 0.00 - 0.50 ug/mL-FEU    Comment: (NOTE) At the manufacturer cut-off of 0.50 ug/mL FEU, this assay has been documented to exclude PE with a sensitivity and negative predictive value of 97 to 99%.  At this time, this assay has not been approved by the FDA to exclude DVT/VTE. Results  should be correlated with clinical presentation.   I-stat troponin, ED     Status: None   Collection Time: 05/26/16  4:01 AM  Result Value Ref Range   Troponin i, poc 0.00 0.00 - 0.08 ng/mL   Comment 3            Comment: Due to the release kinetics of cTnI, a negative result within the first hours of the onset of symptoms does not rule out myocardial infarction with  certainty. If myocardial infarction is still suspected, repeat the test at appropriate intervals.   Troponin I     Status: None   Collection Time: 05/26/16  6:50 AM  Result Value Ref Range   Troponin I <0.03 <0.03 ng/mL   Dg Chest Port 1 View  Result Date: 05/26/2016 CLINICAL DATA:  Progressively worsening chest pain constant since 05/24/2016, chest pain with exertion, history diabetes mellitus, hypertension EXAM: PORTABLE CHEST 1 VIEW COMPARISON:  Portable exam 0346 hours compared to 05/20/2016 FINDINGS: Normal heart size, mediastinal contours, and pulmonary vascularity. Atherosclerotic calcification aorta. Lungs clear. No pleural effusion or pneumothorax. No acute osseous findings. IMPRESSION: No acute abnormalities. Aortic atherosclerosis. Electronically Signed   By: Lavonia Dana M.D.   On: 05/26/2016 07:43    ROS: General:no colds or fevers, no weight changes, recent physical by PCP and everything was stable.  Skin:no rashes or ulcers HEENT:no blurred vision, no congestion CV:see HPI PUL:see HPI GI:no diarrhea constipation or melena, no indigestion GU:no hematuria, no dysuria MS:no joint pain, no claudication Neuro:no syncope, no lightheadedness Endo:+ diabetes, no thyroid disease  Blood pressure 131/71, pulse 60, temperature 98.3 F (36.8 C), temperature source Oral, resp. rate 19, height _0  (1.753 m), weight 174 lb (78.9 kg), SpO2 100 %. PE: General:Pleasant affect, NAD Skin:Warm and dry, brisk capillary refill HEENT:normocephalic, sclera clear, mucus membranes moist Neck:supple, no JVD, no bruits    Heart:S1S2 RRR without murmur, gallup, rub or click, no pain to palpation of chest wall Lungs:clear without rales, rhonchi, or wheezes ALP:FXTK, non tender, + BS, do not palpate liver spleen or masses Ext:no lower ext edema, 2+ pedal pulses, 2+ radial pulses, no pain with Lt arm movement.  Neuro:alert and oriented X 3, MAE, follows commands, + facial symmetry   Assessment/Plan  Chest pain, now only with deep breath.  He had picked up case of water before this occurred. He had been very active but not so much recently. Nuc study in Oct was neg for ischemia.  He does have multiple risk factors.  Troponin is neg x 2. Possible discharge. Dr. Gwen Her to see.   DM-2 controlled  HLD- controlled  RBBB old  HTN- controlled.  Cecilie Kicks Nurse Practitioner Certified [place] Pager [phone] or after 5pm or weekends call [phone] 05/26/2016, 9:08 AM   Attending note:  Patient seen and examined. Reviewed records and discussed the case with Ms. Ingold NP. Mr. Schara presents for evaluation of a fairly prolonged episode of precordial discomfort that occurred after he lifted a heavy case of water bottles yesterday. Did not mention the symptoms to his wife before he went to bed. They continued and ultimately EMS was summoned, nitroglycerin was not helpful, morphine eased his discomfort. Describes a pulling sensation anteriorly, worse when he takes a deep breath or moves. ECG shows sinus rhythm with right bundle branch block which is old, nonspecific ST segment changes. He has a history of coronary artery calcification by chest CT with calcium score of 207, recently a Myoview study in October which was negative for ischemia. He does not report typical exertional chest discomfort. Exercises with an elliptical machine.  On examination in the ER he appears comfortable. Describes a mild pleuritic discomfort in his chest. He has been hemodynamically stable, in sinus rhythm by  telemetry, systolic blood pressure 329 to 1:30 range. Lungs exhibit clear breath sounds, no wheezing. Cardiac exam reveals RRR without gallop or pericardial rub. Lab work shows creatinine 1.3, potassium 4.2, LDL 58,  hemoglobin 11.8, initial troponin I less than 0.03 and second troponin I less than 0.03. D-dimer negative. Chest x-ray shows no acute findings.  Precordial pain, largely atypical and pleuritic. Patient describes onset of symptoms after lifting a heavy case of water bottles. Better after receiving morphine but not nitroglycerin. Suspect musculoskeletal etiology at this time. He does have documented coronary calcifications as outlined above but recent negative ischemic workup in October. Cardiac markers argue against ACS and chest x-ray shows no acute findings. Discussed possibility of observation admission, however patient would like very much to go home. Since he is feeling better, anticipate discharge from ER. He plans to use hot packs on his chest as well as anti-inflammatories in the short-term. If symptoms escalate I have recommended that he seek medical attention and be reevaluated. Patient and family members present voiced comfort with plan. We will arrange a follow-up visit with Dr. Percival Spanish.  Satira Sark, M.D., F.A.C.C.

## 2016-06-04 ENCOUNTER — Encounter: Payer: Self-pay | Admitting: Family Medicine

## 2016-06-04 ENCOUNTER — Ambulatory Visit (INDEPENDENT_AMBULATORY_CARE_PROVIDER_SITE_OTHER): Payer: Medicare Other | Admitting: Family Medicine

## 2016-06-04 VITALS — BP 128/73 | HR 63 | Temp 97.4°F | Ht 69.0 in | Wt 172.0 lb

## 2016-06-04 DIAGNOSIS — R0789 Other chest pain: Secondary | ICD-10-CM | POA: Diagnosis not present

## 2016-06-04 NOTE — Progress Notes (Signed)
Subjective:    Patient ID: KNOWLEDGE BAQUERO, male    DOB: 1940-10-18, 76 y.o.   MRN: [id number]  HPI Patient here today for hospital follow up from [facility] on 05/26/16 where he was seen for chest pain. The pain was diagnosed to be muscular and he is now feeling back to normal. The patient had done some heavy lifting of crates of water picking them up moving in the car and moving them to the home. It was felt the discomfort was from that. The troponin levels were negative and a chest x-ray was negative. The patient is feeling good and we told him he did not need to go see the cardiologist especially since he had a stress test and since he recovered so well after his hospitalization for the chest pain.    Patient Active Problem List   Diagnosis Date Noted  . Chest pain at rest 05/26/2016  . Thrombocytopenia ([place]) 03/18/2015  . Sebaceous cyst 01/23/2015  . Atopic dermatitis 01/23/2015  . Foot lesion 01/16/2015  . Thoracic disc herniation 06/20/2014  . Left varicocele 06/20/2014  . Abdominal aortic atherosclerosis ([place]) 06/20/2014  . Ventral hernia without obstruction or gangrene 02/04/2014  . Personal history of colonic polyps 02/04/2014  . Hyperlipemia 12/30/2012  . Hypertension 12/30/2012  . BPH (benign prostatic hypertrophy) 12/30/2012  . Diabetes type 2, controlled ([place]) 09/28/2012  . RBBB 04/11/2010  . CARDIOVASCULAR FUNCTION STUDY, ABNORMAL 04/11/2010  . Diaphragmatic hernia 09/14/2007  . NEPHROLITHIASIS 09/14/2007   Outpatient Encounter Prescriptions as of 06/04/2016  Medication Sig  . aspirin 81 MG tablet Take 81 mg by mouth daily.    . cholecalciferol (VITAMIN D) 1000 UNITS tablet Take 2,000 Units by mouth daily.   . Cinnamon 500 MG TABS Take 1,000 tablets by mouth 2 (two) times daily.  Marland Kitchen doxylamine, Sleep, (UNISOM) 25 MG tablet Take 25 mg by mouth at bedtime as needed for sleep.   . empagliflozin (JARDIANCE) 10 MG TABS tablet Take 10 mg by mouth daily.  Marland Kitchen esomeprazole (NEXIUM)  40 MG capsule TAKE (1) CAPSULE DAILY  . glucose blood (ONE TOUCH ULTRA TEST) test strip Check BS qid  Dx: E11.9  . JANUVIA 100 MG tablet TAKE 1 TABLET DAILY  . metFORMIN (GLUCOPHAGE) 1000 MG tablet Take 1 and 1/2 tablets each morning and 1 tablet each evening. (Patient taking differently: Take 1 tablet by mouth twice daily)  . mupirocin ointment (BACTROBAN) 2 % Apply 1 application topically 2 (two) times daily. (Patient taking differently: Apply 1 application topically 2 (two) times daily as needed (rash). )  . ONE TOUCH ULTRA TEST test strip CHECK BLOOD SUGAR 4 TIMES A DAY  . Probiotic Product (PROBIOTIC PO) Take 1 tablet by mouth daily.  . simvastatin (ZOCOR) 40 MG tablet TAKE 1 TABLET EVERY EVENING  . valsartan-hydrochlorothiazide (DIOVAN-HCT) 160-12.5 MG tablet Take 1 tablet by mouth daily.   No facility-administered encounter medications on file as of 06/04/2016.       Review of Systems  Constitutional: Negative.   HENT: Negative.   Eyes: Negative.   Respiratory: Negative.   Cardiovascular: Negative.   Gastrointestinal: Negative.   Endocrine: Negative.   Genitourinary: Negative.   Musculoskeletal: Negative.   Skin: Negative.   Allergic/Immunologic: Negative.   Neurological: Negative.   Hematological: Negative.   Psychiatric/Behavioral: Negative.        Objective:   Physical Exam  Constitutional: He is oriented to person, place, and time. He appears well-developed and well-nourished. No distress.  HENT:  Head: Normocephalic and atraumatic.  Eyes: Conjunctivae and EOM are normal. Pupils are equal, round, and reactive to light. Right eye exhibits no discharge. Left eye exhibits no discharge. No scleral icterus.  Neck: Normal range of motion.  Cardiovascular: Normal rate, regular rhythm and normal heart sounds.   No murmur heard. Pulmonary/Chest: Effort normal and breath sounds normal. No respiratory distress. He has no wheezes. He has no rales. He exhibits no tenderness.    Abdominal: Soft. Bowel sounds are normal. He exhibits no mass. There is no tenderness. There is no rebound and no guarding.  Musculoskeletal: Normal range of motion. He exhibits no edema.  Neurological: He is alert and oriented to person, place, and time. No cranial nerve deficit.  Skin: Skin is warm and dry. No rash noted.  Psychiatric: He has a normal mood and affect. His behavior is normal. Judgment and thought content normal.  Nursing note and vitals reviewed.   BP 128/73 (BP Location: Left Arm)   Pulse 63   Temp 97.4 F (36.3 C) (Oral)   Ht 5\' 9"  (1.753 m)   Wt 172 lb (78 kg)   BMI 25.40 kg/m        Assessment & Plan:  1. Chest wall pain -The patient's chest wall pain and chest pain have completely resolved. They resolved a couple days after the visit to the emergency room. All studies in the emergency room were negative. The patient had recently had a chemical stress test and this was normal. This was in October of this past year. -The patient will try to be more careful with lifting heavy objects in the future. Patient Instructions  Avoid heavy lifting Use your needs to do the lifting and try not to lift some much at one time Bring in blood sugars for review and about 1 month If the numbers still run high in the morning we will discuss this with clinical pharmacy to see if other adjustments can be made. The last hemoglobin A1c was good.   Arrie Senate MD

## 2016-06-04 NOTE — Patient Instructions (Addendum)
Avoid heavy lifting Use your needs to do the lifting and try not to lift some much at one time Bring in blood sugars for review and about 1 month If the numbers still run high in the morning we will discuss this with clinical pharmacy to see if other adjustments can be made. The last hemoglobin A1c was good.

## 2016-06-11 ENCOUNTER — Other Ambulatory Visit: Payer: Self-pay | Admitting: Family Medicine

## 2016-06-13 ENCOUNTER — Other Ambulatory Visit: Payer: Medicare Other

## 2016-06-13 DIAGNOSIS — E119 Type 2 diabetes mellitus without complications: Secondary | ICD-10-CM

## 2016-06-13 DIAGNOSIS — I10 Essential (primary) hypertension: Secondary | ICD-10-CM

## 2016-06-13 DIAGNOSIS — K219 Gastro-esophageal reflux disease without esophagitis: Secondary | ICD-10-CM

## 2016-06-13 DIAGNOSIS — E7800 Pure hypercholesterolemia, unspecified: Secondary | ICD-10-CM

## 2016-06-13 DIAGNOSIS — E559 Vitamin D deficiency, unspecified: Secondary | ICD-10-CM

## 2016-06-13 DIAGNOSIS — D696 Thrombocytopenia, unspecified: Secondary | ICD-10-CM

## 2016-06-13 DIAGNOSIS — Z1211 Encounter for screening for malignant neoplasm of colon: Secondary | ICD-10-CM

## 2016-06-14 ENCOUNTER — Telehealth: Payer: Self-pay | Admitting: Family Medicine

## 2016-06-14 LAB — BMP8+EGFR
BUN/Creatinine Ratio: 20 (ref 10–24)
BUN: 24 mg/dL (ref 8–27)
CALCIUM: 9.1 mg/dL (ref 8.6–10.2)
CHLORIDE: 97 mmol/L (ref 96–106)
CO2: 23 mmol/L (ref 18–29)
Creatinine, Ser: 1.22 mg/dL (ref 0.76–1.27)
GFR calc Af Amer: 67 mL/min/{1.73_m2} (ref >59)
GFR, EST NON AFRICAN AMERICAN: 58 mL/min/{1.73_m2} — AB (ref >59)
Glucose: 113 mg/dL — ABNORMAL HIGH (ref 65–99)
POTASSIUM: 4.3 mmol/L (ref 3.5–5.2)
Sodium: 138 mmol/L (ref 134–144)

## 2016-06-14 LAB — FECAL OCCULT BLOOD, IMMUNOCHEMICAL: Fecal Occult Bld: NEGATIVE

## 2016-06-14 LAB — CBC WITH DIFFERENTIAL/PLATELET
BASOS ABS: 0.1 10*3/uL (ref 0.0–0.2)
Basos: 1 %
EOS (ABSOLUTE): 0.4 10*3/uL (ref 0.0–0.4)
Eos: 7 %
HEMOGLOBIN: 11.9 g/dL — AB (ref 13.0–17.7)
Hematocrit: 36.1 % — ABNORMAL LOW (ref 37.5–51.0)
IMMATURE GRANS (ABS): 0 10*3/uL (ref 0.0–0.1)
IMMATURE GRANULOCYTES: 0 %
Lymphocytes Absolute: 1 10*3/uL (ref 0.7–3.1)
Lymphs: 17 %
MCH: 32.1 pg (ref 26.6–33.0)
MCHC: 33 g/dL (ref 31.5–35.7)
MCV: 97 fL (ref 79–97)
MONOCYTES: 7 %
Monocytes Absolute: 0.4 10*3/uL (ref 0.1–0.9)
NEUTROS PCT: 68 %
Neutrophils Absolute: 4.1 10*3/uL (ref 1.4–7.0)
PLATELETS: 164 10*3/uL (ref 150–379)
RBC: 3.71 x10E6/uL — ABNORMAL LOW (ref 4.14–5.80)
RDW: 13.5 % (ref 12.3–15.4)
WBC: 6 10*3/uL (ref 3.4–10.8)

## 2016-06-14 LAB — HEPATIC FUNCTION PANEL
ALBUMIN: 4.3 g/dL (ref 3.5–4.8)
ALT: 21 IU/L (ref 0–44)
AST: 18 IU/L (ref 0–40)
Alkaline Phosphatase: 46 IU/L (ref 39–117)
BILIRUBIN TOTAL: 0.5 mg/dL (ref 0.0–1.2)
BILIRUBIN, DIRECT: 0.15 mg/dL (ref 0.00–0.40)
TOTAL PROTEIN: 6.5 g/dL (ref 6.0–8.5)

## 2016-06-14 LAB — VITAMIN D 25 HYDROXY (VIT D DEFICIENCY, FRACTURES): Vit D, 25-Hydroxy: 43.1 ng/mL (ref 30.0–100.0)

## 2016-06-14 NOTE — Telephone Encounter (Signed)
Patient aware of lab results.

## 2016-06-19 ENCOUNTER — Other Ambulatory Visit: Payer: Self-pay | Admitting: Family Medicine

## 2016-06-19 ENCOUNTER — Ambulatory Visit: Payer: Medicare Other | Admitting: Cardiology

## 2016-07-23 ENCOUNTER — Telehealth: Payer: Self-pay | Admitting: Family Medicine

## 2016-07-23 MED ORDER — TRIAMCINOLONE ACETONIDE 0.5 % EX OINT: 1.0000 "application " | TOPICAL_OINTMENT | Freq: Two times a day (BID) | CUTANEOUS | 1 refills | Status: DC

## 2016-07-23 NOTE — Telephone Encounter (Signed)
What is the name of the medication? triamcinalone acetonade ointment .5%  Have you contacted your pharmacy to request a refill? No.   Which pharmacy would you like this sent to? [facility]. This will be a new rx there.   Patient notified that their request is being sent to the clinical staff for review and that they should receive a call once it is complete. If they do not receive a call within 24 hours they can check with their pharmacy or our office.

## 2016-07-23 NOTE — Telephone Encounter (Signed)
Refill done.  

## 2016-08-07 LAB — HM DIABETES EYE EXAM

## 2016-08-19 ENCOUNTER — Other Ambulatory Visit: Payer: Self-pay | Admitting: Family Medicine

## 2016-09-18 ENCOUNTER — Other Ambulatory Visit: Payer: Self-pay | Admitting: Family Medicine

## 2016-10-08 ENCOUNTER — Ambulatory Visit (INDEPENDENT_AMBULATORY_CARE_PROVIDER_SITE_OTHER): Payer: Medicare Other | Admitting: Family Medicine

## 2016-10-08 ENCOUNTER — Ambulatory Visit: Payer: Medicare Other | Admitting: Family Medicine

## 2016-10-08 ENCOUNTER — Encounter: Payer: Self-pay | Admitting: Family Medicine

## 2016-10-08 VITALS — BP 125/68 | HR 61 | Temp 97.3°F | Ht 69.0 in | Wt 172.0 lb

## 2016-10-08 DIAGNOSIS — L97511 Non-pressure chronic ulcer of other part of right foot limited to breakdown of skin: Secondary | ICD-10-CM

## 2016-10-08 DIAGNOSIS — E11621 Type 2 diabetes mellitus with foot ulcer: Secondary | ICD-10-CM

## 2016-10-08 MED ORDER — SILVER SULFADIAZINE 1 % EX CREA: 1.0000 "application " | TOPICAL_CREAM | Freq: Every day | CUTANEOUS | 2 refills | Status: DC

## 2016-10-08 NOTE — Progress Notes (Signed)
Subjective:  [name], male    DOB: [birth year]  Age: 76 y.o. MRN: [id number]  CC: Blister (pt here today c/o blister on his third right toe from walking and also worried because of multiple tick bites.)   Joe [name] presents for Blister on the plantar surface of the right third toe. He has been doing a lot of walking for exercise. Notice some discomfort and swelling in that area. He is a diabetic and he monitors his feet closely because of that. Just noticed it yesterday. Cleaned it and put a Band-Aid on it and made an appointment to be seen.  Patient had multiple tick bites last year and had a positive test for Lyme disease. Wants to know strategies for this year. He had to take 5 rounds of antibiotics last year. One tick bite this spring formed a little knot on his right flank area.  History Joe [name] has a past medical history of Arthritis; Cataract; Chest pain at rest (05/26/2016); Colon polyps; Diabetes mellitus without complication ([place]); Dyslipidemia; ESOPHAGITIS, REFLUX (07/09/2004); Hiatal hernia; HOH (hard of hearing); Hypertension; Irritable bowel syndrome; Kidney stone (1970); Pneumonia; and Tick bites.   He has a past surgical history that includes Tonsillectomy; Hemicolectomy (08/2005); Esophageal dilation (1996); Esophageal dilation (2003); Flexible sigmoidoscopy; Colonoscopy; and Knee surgery (Right).   His family history includes Breast cancer in his mother; Cancer in his brother; Diabetes in his brother; Heart disease in his brother; Hyperlipidemia in his brother and father; Hypertension in his brother and father; Stroke in his brother; Stroke (age of onset: 77) in his father.He reports that he quit smoking about 32 years ago. His smoking use included Cigarettes. He started smoking about 62 years ago. He smoked 2.00 packs per day. He has never used smokeless tobacco. He reports that he drinks about 2.5 oz of alcohol per week . He reports that he does not use  drugs.  Current Outpatient Prescriptions on File Prior to Visit  Medication Sig Dispense Refill  . aspirin 81 MG tablet Take 81 mg by mouth daily.      . cholecalciferol (VITAMIN D) 1000 UNITS tablet Take 2,000 Units by mouth daily.     . Cinnamon 500 MG TABS Take 1,000 tablets by mouth 2 (two) times daily.    Marland Kitchen doxylamine, Sleep, (UNISOM) 25 MG tablet Take 25 mg by mouth at bedtime as needed for sleep.     Marland Kitchen esomeprazole (NEXIUM) 40 MG capsule TAKE (1) CAPSULE DAILY 30 capsule 3  . glucose blood (ONE TOUCH ULTRA TEST) test strip Check BS qid  Dx: E11.9 100 each 11  . JANUVIA 100 MG tablet TAKE 1 TABLET DAILY 30 tablet 1  . JARDIANCE 10 MG TABS tablet Take 10 mg by mouth daily. 30 tablet 2  . metFORMIN (GLUCOPHAGE) 1000 MG tablet Take 1 and 1/2 tablets each morning and 1 tablet each evening. 75 tablet 5  . mupirocin ointment (BACTROBAN) 2 % Apply 1 application topically 2 (two) times daily. (Patient taking differently: Apply 1 application topically 2 (two) times daily as needed (rash). ) 22 g 1  . ONE TOUCH ULTRA TEST test strip CHECK BLOOD SUGAR 4 TIMES A DAY 100 each 2  . Probiotic Product (PROBIOTIC PO) Take 1 tablet by mouth daily.    . simvastatin (ZOCOR) 40 MG tablet TAKE 1 TABLET EVERY EVENING 90 tablet 0  . triamcinolone ointment (KENALOG) 0.5 % Apply 1 application topically 2 (two) times daily. 30 g 1  .  valsartan-hydrochlorothiazide (DIOVAN-HCT) 160-12.5 MG tablet Take 1 tablet by mouth daily.     No current facility-administered medications on file prior to visit.     ROS Review of Systems  Constitutional: Negative for chills, diaphoresis and fever.  HENT: Negative.   Respiratory: Negative.   Musculoskeletal: Negative for arthralgias and myalgias.  Skin: Negative for rash.  Neurological: Negative for weakness and headaches.    Objective:  BP 125/68   Pulse 61   Temp 97.3 F (36.3 C) (Oral)   Ht 5\' 9"  (1.753 m)   Wt 172 lb (78 kg)   BMI 25.40 kg/m   Physical Exam    Constitutional: He is oriented to person, place, and time. He appears well-developed and well-nourished. No distress.  HENT:  Mouth/Throat: No posterior oropharyngeal erythema.  Neck: Normal range of motion. Neck supple.  Pulmonary/Chest: Breath sounds normal. No respiratory distress.  Abdominal: Soft.  Musculoskeletal: Normal range of motion. He exhibits no edema or tenderness.  Neurological: He is alert and oriented to person, place, and time.  Skin: Skin is warm and dry. No rash noted. No erythema.  At the plantar surfaceof the right third toe extending into the webspace there is a clean-appearing blister with moderate serous fluid underneath. It is not open. It is moderately tender. There is no erythema or purulence to it. There is full range of motion. No sign of trauma otherwise.  There is a small subcutaneous nodule at the right flank that represents healing tick bite. It does not appear inflamed at this time. Patient reassured.  Vitals reviewed.   Assessment & Plan:   Joe [name] was seen today for blister.  Diagnoses and all orders for this visit:  Diabetic ulcer of toe of right foot associated with type 2 diabetes mellitus, limited to breakdown of skin ([place])  Other orders -     silver sulfADIAZINE (SILVADENE) 1 % cream; Apply 1 application topically daily.   I am having Joe [name] start on silver sulfADIAZINE. I am also having him maintain his aspirin, cholecalciferol, Cinnamon, glucose blood, doxylamine (Sleep), valsartan-hydrochlorothiazide, mupirocin ointment, esomeprazole, Probiotic Product (PROBIOTIC PO), ONE TOUCH ULTRA TEST, metFORMIN, triamcinolone ointment, simvastatin, JARDIANCE, and JANUVIA.  Meds ordered this encounter  Medications  . silver sulfADIAZINE (SILVADENE) 1 % cream    Sig: Apply 1 application topically daily.    Dispense:  50 g    Refill:  2   Dress daily as discussed. With regard to tick bites using DEET is recommended. Tuck shoes and boots when  outdoors especially in the woods. Wear long sleeves habits etc. Check body for tick bites daily. Remove by pulling directly before getting tick remover. Antibiotics should be used only as a backup. And only when the tick is possibly present for more than 2 days. 30 minutes was spent with this patient. Greater than one half spent in counseling regarding tick avoidance and proper treatment for diabetic ulcers and the implications of diabetic ulcers. We suggested checking with Dr. Laurance Flatten about diabetic shoes at his next follow-up. Follow-up: Return in about 2 weeks (around 10/22/2016), or if symptoms worsen or fail to improve, for With Dr. Laurance Flatten for recheck of the wound.  Claretta Fraise, M.D.

## 2016-10-17 ENCOUNTER — Encounter: Payer: Self-pay | Admitting: Family Medicine

## 2016-10-17 ENCOUNTER — Ambulatory Visit (INDEPENDENT_AMBULATORY_CARE_PROVIDER_SITE_OTHER): Payer: Medicare Other | Admitting: Family Medicine

## 2016-10-17 VITALS — BP 128/68 | HR 58 | Temp 97.4°F | Ht 69.0 in | Wt 171.0 lb

## 2016-10-17 DIAGNOSIS — S90821S Blister (nonthermal), right foot, sequela: Secondary | ICD-10-CM

## 2016-10-17 DIAGNOSIS — W57XXXA Bitten or stung by nonvenomous insect and other nonvenomous arthropods, initial encounter: Secondary | ICD-10-CM | POA: Diagnosis not present

## 2016-10-17 DIAGNOSIS — E119 Type 2 diabetes mellitus without complications: Secondary | ICD-10-CM

## 2016-10-17 DIAGNOSIS — L089 Local infection of the skin and subcutaneous tissue, unspecified: Secondary | ICD-10-CM | POA: Diagnosis not present

## 2016-10-17 MED ORDER — DOXYCYCLINE HYCLATE 100 MG PO TABS: 100.0000 mg | ORAL_TABLET | Freq: Two times a day (BID) | ORAL | 1 refills | Status: DC

## 2016-10-17 NOTE — Addendum Note (Signed)
Addended by: Zannie Cove on: 10/17/2016 09:00 AM   Modules accepted: Orders

## 2016-10-17 NOTE — Patient Instructions (Signed)
Wear wider shoes and shoes that have more depth Use sterile gauze between the fourth and fifth toes Use mupirocin ointment sparingly between the fourth and fifth toes Call if any redness or drainage occurs

## 2016-10-17 NOTE — Progress Notes (Signed)
Subjective:    [name], male    DOB: 1940/09/20, 76 y.o.   MRN: [id number]  HPI Pt here today for a blister of his right 4th toe.The patient had the flu recently and saw another provider and was given Silvadene cream to use on the blister. The blister appears to be healing. He does wear very narrow shoes. He also gives a history of multiple tick bites including deer ticks and wood ticks.    Patient Active Problem List   Diagnosis Date Noted  . Chest pain at rest 05/26/2016  . Thrombocytopenia ([place]) 03/18/2015  . Sebaceous cyst 01/23/2015  . Atopic dermatitis 01/23/2015  . Foot lesion 01/16/2015  . Thoracic disc herniation 06/20/2014  . Left varicocele 06/20/2014  . Abdominal aortic atherosclerosis ([place]) 06/20/2014  . Ventral hernia without obstruction or gangrene 02/04/2014  . Personal history of colonic polyps 02/04/2014  . Hyperlipemia 12/30/2012  . Hypertension 12/30/2012  . BPH (benign prostatic hypertrophy) 12/30/2012  . Diabetes type 2, controlled ([place]) 09/28/2012  . RBBB 04/11/2010  . CARDIOVASCULAR FUNCTION STUDY, ABNORMAL 04/11/2010  . Diaphragmatic hernia 09/14/2007  . NEPHROLITHIASIS 09/14/2007   Outpatient Encounter Prescriptions as of 10/17/2016  Medication Sig  . aspirin 81 MG tablet Take 81 mg by mouth daily.    . cholecalciferol (VITAMIN D) 1000 UNITS tablet Take 2,000 Units by mouth daily.   . Cinnamon 500 MG TABS Take 1,000 tablets by mouth 2 (two) times daily.  Marland Kitchen doxylamine, Sleep, (UNISOM) 25 MG tablet Take 25 mg by mouth at bedtime as needed for sleep.   Marland Kitchen esomeprazole (NEXIUM) 40 MG capsule TAKE (1) CAPSULE DAILY  . glucose blood (ONE TOUCH ULTRA TEST) test strip Check BS qid  Dx: E11.9  . JANUVIA 100 MG tablet TAKE 1 TABLET DAILY  . JARDIANCE 10 MG TABS tablet Take 10 mg by mouth daily.  . metFORMIN (GLUCOPHAGE) 1000 MG tablet Take 1 and 1/2 tablets each morning and 1 tablet each evening.  . mupirocin ointment (BACTROBAN) 2 % Apply 1  application topically 2 (two) times daily. (Patient taking differently: Apply 1 application topically 2 (two) times daily as needed (rash). )  . ONE TOUCH ULTRA TEST test strip CHECK BLOOD SUGAR 4 TIMES A DAY  . Probiotic Product (PROBIOTIC PO) Take 1 tablet by mouth daily.  . silver sulfADIAZINE (SILVADENE) 1 % cream Apply 1 application topically daily.  . simvastatin (ZOCOR) 40 MG tablet TAKE 1 TABLET EVERY EVENING  . triamcinolone ointment (KENALOG) 0.5 % Apply 1 application topically 2 (two) times daily.  . valsartan-hydrochlorothiazide (DIOVAN-HCT) 160-12.5 MG tablet Take 1 tablet by mouth daily.   No facility-administered encounter medications on file as of 10/17/2016.       Review of Systems  Constitutional: Negative.   HENT: Negative.   Eyes: Negative.   Respiratory: Negative.   Cardiovascular: Negative.   Gastrointestinal: Negative.   Endocrine: Negative.   Genitourinary: Negative.   Musculoskeletal: Negative.   Skin: Negative.        Blister - right 4 th toe  Allergic/Immunologic: Negative.   Neurological: Negative.   Hematological: Negative.   Psychiatric/Behavioral: Negative.        Objective:   Physical Exam  Constitutional: He is oriented to person, place, and time. He appears well-developed and well-nourished. No distress.  HENT:  Head: Normocephalic.  Eyes: Conjunctivae and EOM are normal. Pupils are equal, round, and reactive to light. Right eye exhibits no discharge. Left eye exhibits no discharge. No  scleral icterus.  Neck: Normal range of motion.  Musculoskeletal: Normal range of motion. He exhibits no edema.  Neurological: He is alert and oriented to person, place, and time.  Skin: Skin is warm and dry. No rash noted. No erythema.  The blister beneath the fourth toe appears to be healing well and there is no redness. There is a blister between the fourth and fifth toes on the fourth toe. This may have come from shoes the patient's was wearing that were to  height and to narrow.  Psychiatric: He has a normal mood and affect. His behavior is normal. Judgment and thought content normal.  Nursing note and vitals reviewed.   BP 128/68 (BP Location: Left Arm)   Pulse (!) 58   Temp 97.4 F (36.3 C) (Oral)   Ht 5\' 9"  (1.753 m)   Wt 171 lb (77.6 kg)   BMI 25.25 kg/m        Assessment & Plan:  1. Tick bite, initial encounter -Doxycycline twice daily for 3 weeks 100 mg  2. Blister of foot with infection, right, sequela -Wear wider shoes and shoes that have more depth and use sterile gauze between the fourth and fifth toes and use mupirocin ointment sparingly as directed -Call if any increasing redness  3. Type 2 diabetes mellitus without complication, without long-term current use of insulin (HCC) -Monitor blood sugars closely and blister closely on foot  No orders of the defined types were placed in this encounter.  Patient Instructions  Wear wider shoes and shoes that have more depth Use sterile gauze between the fourth and fifth toes Use mupirocin ointment sparingly between the fourth and fifth toes Call if any redness or drainage occurs  Arrie Senate MD

## 2016-10-29 ENCOUNTER — Other Ambulatory Visit: Payer: Self-pay | Admitting: Family Medicine

## 2016-10-29 ENCOUNTER — Other Ambulatory Visit: Payer: Medicare Other

## 2016-10-29 DIAGNOSIS — D696 Thrombocytopenia, unspecified: Secondary | ICD-10-CM

## 2016-10-29 DIAGNOSIS — E7800 Pure hypercholesterolemia, unspecified: Secondary | ICD-10-CM

## 2016-10-29 DIAGNOSIS — K219 Gastro-esophageal reflux disease without esophagitis: Secondary | ICD-10-CM

## 2016-10-29 DIAGNOSIS — I10 Essential (primary) hypertension: Secondary | ICD-10-CM

## 2016-10-29 DIAGNOSIS — E559 Vitamin D deficiency, unspecified: Secondary | ICD-10-CM

## 2016-10-29 DIAGNOSIS — E119 Type 2 diabetes mellitus without complications: Secondary | ICD-10-CM

## 2016-10-29 DIAGNOSIS — Z1159 Encounter for screening for other viral diseases: Secondary | ICD-10-CM

## 2016-10-29 LAB — BAYER DCA HB A1C WAIVED: HB A1C (BAYER DCA - WAIVED): 6.9 % (ref <7.0)

## 2016-10-30 LAB — CBC WITH DIFFERENTIAL/PLATELET
BASOS ABS: 0.1 10*3/uL (ref 0.0–0.2)
Basos: 1 %
EOS (ABSOLUTE): 0.5 10*3/uL — AB (ref 0.0–0.4)
Eos: 8 %
HEMOGLOBIN: 12 g/dL — AB (ref 13.0–17.7)
Hematocrit: 35.6 % — ABNORMAL LOW (ref 37.5–51.0)
IMMATURE GRANS (ABS): 0 10*3/uL (ref 0.0–0.1)
Immature Granulocytes: 0 %
LYMPHS ABS: 1.1 10*3/uL (ref 0.7–3.1)
Lymphs: 19 %
MCH: 31.7 pg (ref 26.6–33.0)
MCHC: 33.7 g/dL (ref 31.5–35.7)
MCV: 94 fL (ref 79–97)
Monocytes Absolute: 0.5 10*3/uL (ref 0.1–0.9)
Monocytes: 8 %
NEUTROS ABS: 3.6 10*3/uL (ref 1.4–7.0)
Neutrophils: 64 %
PLATELETS: 154 10*3/uL (ref 150–379)
RBC: 3.78 x10E6/uL — ABNORMAL LOW (ref 4.14–5.80)
RDW: 13.6 % (ref 12.3–15.4)
WBC: 5.7 10*3/uL (ref 3.4–10.8)

## 2016-10-30 LAB — HEPATIC FUNCTION PANEL
ALK PHOS: 49 IU/L (ref 39–117)
ALT: 15 IU/L (ref 0–44)
AST: 14 IU/L (ref 0–40)
Albumin: 4.3 g/dL (ref 3.5–4.8)
BILIRUBIN, DIRECT: 0.13 mg/dL (ref 0.00–0.40)
Bilirubin Total: 0.4 mg/dL (ref 0.0–1.2)
TOTAL PROTEIN: 6.5 g/dL (ref 6.0–8.5)

## 2016-10-30 LAB — NMR, LIPOPROFILE
CHOLESTEROL: 116 mg/dL (ref 100–199)
HDL Cholesterol by NMR: 34 mg/dL — ABNORMAL LOW (ref >39)
HDL Particle Number: 25.3 umol/L — ABNORMAL LOW (ref >=30.5)
LDL PARTICLE NUMBER: 747 nmol/L (ref <1000)
LDL Size: 20 nm (ref >20.5)
LDL-C: 41 mg/dL (ref 0–99)
LP-IR SCORE: 46 — AB (ref <=45)
Small LDL Particle Number: 472 nmol/L (ref <=527)
Triglycerides by NMR: 207 mg/dL — ABNORMAL HIGH (ref 0–149)

## 2016-10-30 LAB — BMP8+EGFR
BUN / CREAT RATIO: 17 (ref 10–24)
BUN: 21 mg/dL (ref 8–27)
CO2: 21 mmol/L (ref 18–29)
CREATININE: 1.27 mg/dL (ref 0.76–1.27)
Calcium: 9.3 mg/dL (ref 8.6–10.2)
Chloride: 101 mmol/L (ref 96–106)
GFR calc non Af Amer: 55 mL/min/{1.73_m2} — ABNORMAL LOW (ref >59)
GFR, EST AFRICAN AMERICAN: 63 mL/min/{1.73_m2} (ref >59)
GLUCOSE: 131 mg/dL — AB (ref 65–99)
Potassium: 4.5 mmol/L (ref 3.5–5.2)
SODIUM: 140 mmol/L (ref 134–144)

## 2016-10-30 LAB — VITAMIN D 25 HYDROXY (VIT D DEFICIENCY, FRACTURES): VIT D 25 HYDROXY: 49.6 ng/mL (ref 30.0–100.0)

## 2016-10-30 LAB — HEPATITIS C ANTIBODY

## 2016-10-31 ENCOUNTER — Encounter: Payer: Self-pay | Admitting: Family Medicine

## 2016-10-31 ENCOUNTER — Ambulatory Visit (INDEPENDENT_AMBULATORY_CARE_PROVIDER_SITE_OTHER): Payer: Medicare Other | Admitting: Family Medicine

## 2016-10-31 VITALS — BP 119/68 | HR 56 | Temp 97.2°F | Ht 69.0 in | Wt 170.0 lb

## 2016-10-31 DIAGNOSIS — L089 Local infection of the skin and subcutaneous tissue, unspecified: Secondary | ICD-10-CM

## 2016-10-31 DIAGNOSIS — S90821S Blister (nonthermal), right foot, sequela: Secondary | ICD-10-CM

## 2016-10-31 DIAGNOSIS — E559 Vitamin D deficiency, unspecified: Secondary | ICD-10-CM

## 2016-10-31 DIAGNOSIS — D696 Thrombocytopenia, unspecified: Secondary | ICD-10-CM | POA: Diagnosis not present

## 2016-10-31 DIAGNOSIS — E119 Type 2 diabetes mellitus without complications: Secondary | ICD-10-CM

## 2016-10-31 DIAGNOSIS — K219 Gastro-esophageal reflux disease without esophagitis: Secondary | ICD-10-CM | POA: Diagnosis not present

## 2016-10-31 DIAGNOSIS — I700 Atherosclerosis of aorta: Secondary | ICD-10-CM | POA: Diagnosis not present

## 2016-10-31 DIAGNOSIS — I10 Essential (primary) hypertension: Secondary | ICD-10-CM

## 2016-10-31 DIAGNOSIS — E7800 Pure hypercholesterolemia, unspecified: Secondary | ICD-10-CM

## 2016-10-31 NOTE — Progress Notes (Signed)
Subjective:    [name], male    DOB: [birth year], 76 y.o.   MRN: [id number]  HPI Pt here for follow up and management of chronic medical problems which includes diabetes, hyperlipidemia and hypertension. He is taking medication regularly.The patient does have concerns about his blood pressure medicine and wants [place] to recheck his toe. He brings in blood pressures for review. All of the blood pressure readings appear to be running fairly well and these included readings most recently in April mostly in the 110-120 range over the 50s to 60s with a few outliers one as high as 093 systolic. He also brings in blood sugars for review and these are running anywhere in the 140-150 range in the morning fasting up to as high as 280 8 in the evening but rarely that high on other occasions mostly in the 170-190 range. The most recent blood sugars are running at bedtime anywhere from 120 up to 238 and fasting in the 160-180 range. The patient's recent lab work will be reviewed with him during the visit today. His cholesterol numbers with advanced lipid testing have an LDL particle number that was good at 747 with an LDL C being 41. Triglycerides were elevated at 207. The HDL particle number remains low. The CBC had a normal white blood cell count. The hemoglobin is stable at 12.0 and consistent with past readings. The platelet count was adequate. The blood sugar is elevated at 131 this time. The creatinine remains good at 1.27 and all the electrolytes are good. All liver function tests were normal. The vitamin D level was good at 49.6. The hepatitis C viral antibody was negative. The hemoglobin A1c was good at 6.9%. The patient comes to the visit today with his wife. He denies any chest pain or shortness of breath or trouble swallowing with heartburn indigestion nausea vomiting diarrhea or blood in the stool. He is passing his water without problems. He does have some concerns about a former blister on  the bottom of the toe of the right foot. This appears to be healing well after quick look at this and he was reassured about this. He also had questions regarding how he was taking his blood pressure medicine which is a half of a pill every other day. This is working well with his blood pressure readings and his blood pressure is good today so he will continue taking the medicine just like he is doing and will let us know if the blood pressures start creeping up.    Patient Active Problem List   Diagnosis Date Noted  . Chest pain at rest 05/26/2016  . Thrombocytopenia ([place]) 03/18/2015  . Sebaceous cyst 01/23/2015  . Atopic dermatitis 01/23/2015  . Foot lesion 01/16/2015  . Thoracic disc herniation 06/20/2014  . Left varicocele 06/20/2014  . Abdominal aortic atherosclerosis ([place]) 06/20/2014  . Ventral hernia without obstruction or gangrene 02/04/2014  . Personal history of colonic polyps 02/04/2014  . Hyperlipemia 12/30/2012  . Hypertension 12/30/2012  . BPH (benign prostatic hypertrophy) 12/30/2012  . Diabetes type 2, controlled ([place]) 09/28/2012  . RBBB 04/11/2010  . CARDIOVASCULAR FUNCTION STUDY, ABNORMAL 04/11/2010  . Diaphragmatic hernia 09/14/2007  . NEPHROLITHIASIS 09/14/2007   Outpatient Encounter Prescriptions as of 10/31/2016  Medication Sig  . aspirin 81 MG tablet Take 81 mg by mouth daily.    . cholecalciferol (VITAMIN D) 1000 UNITS tablet Take 2,000 Units by mouth daily.   . Cinnamon 500 MG TABS Take 1,000  tablets by mouth 2 (two) times daily.  Marland Kitchen doxycycline (VIBRA-TABS) 100 MG tablet Take 1 tablet (100 mg total) by mouth 2 (two) times daily. 1 po bid  . doxylamine, Sleep, (UNISOM) 25 MG tablet Take 25 mg by mouth at bedtime as needed for sleep.   Marland Kitchen esomeprazole (NEXIUM) 40 MG capsule TAKE (1) CAPSULE DAILY  . glucose blood (ONE TOUCH ULTRA TEST) test strip Check BS qid  Dx: E11.9  . JANUVIA 100 MG tablet TAKE 1 TABLET DAILY  . JARDIANCE 10 MG TABS tablet Take 10 mg by  mouth daily.  . metFORMIN (GLUCOPHAGE) 1000 MG tablet Take 1 and 1/2 tablets each morning and 1 tablet each evening.  . mupirocin ointment (BACTROBAN) 2 % Apply 1 application topically 2 (two) times daily. (Patient taking differently: Apply 1 application topically 2 (two) times daily as needed (rash). )  . ONE TOUCH ULTRA TEST test strip CHECK BLOOD SUGAR 4 TIMES A DAY  . Probiotic Product (PROBIOTIC PO) Take 1 tablet by mouth daily.  . silver sulfADIAZINE (SILVADENE) 1 % cream Apply 1 application topically daily.  . simvastatin (ZOCOR) 40 MG tablet TAKE 1 TABLET EVERY EVENING  . triamcinolone ointment (KENALOG) 0.5 % Apply 1 application topically 2 (two) times daily.  . valsartan-hydrochlorothiazide (DIOVAN-HCT) 160-12.5 MG tablet Take 1 tablet by mouth daily.   No facility-administered encounter medications on file as of 10/31/2016.      Review of Systems  Constitutional: Negative.   HENT: Negative.   Eyes: Negative.   Respiratory: Negative.   Cardiovascular: Negative.   Gastrointestinal: Negative.   Endocrine: Negative.   Genitourinary: Negative.   Musculoskeletal: Negative.   Skin: Negative.        Recheck 4th toe right foot  Allergic/Immunologic: Negative.   Neurological: Negative.   Hematological: Negative.   Psychiatric/Behavioral: Negative.        Objective:   Physical Exam  Constitutional: He is oriented to person, place, and time. He appears well-developed and well-nourished. No distress.  The patient is pleasant and alert.  HENT:  Head: Normocephalic and atraumatic.  Right Ear: External ear normal.  Left Ear: External ear normal.  Nose: Nose normal.  Mouth/Throat: Oropharynx is clear and moist. No oropharyngeal exudate.  Eyes: Conjunctivae and EOM are normal. Pupils are equal, round, and reactive to light. Right eye exhibits no discharge. Left eye exhibits no discharge. No scleral icterus.  Neck: Normal range of motion. Neck supple. No thyromegaly present.  No  bruits thyromegaly or anterior cervical adenopathy  Cardiovascular: Normal rate, regular rhythm, normal heart sounds and intact distal pulses.   No murmur heard. Heart has a regular rate and rhythm at 60/m  Pulmonary/Chest: Effort normal and breath sounds normal. No respiratory distress. He has no wheezes. He has no rales. He exhibits no tenderness.  No chest wall masses. Clear anteriorly and posteriorly and no axillary adenopathy  Abdominal: Soft. Bowel sounds are normal. He exhibits no mass. There is tenderness. There is no rebound and no guarding.  There was slight suprapubic tenderness. There were no liver or spleen enlargement. There are no masses. There are no bruits. There is no inguinal adenopathy.  Musculoskeletal: Normal range of motion. He exhibits no edema.  Lymphadenopathy:    He has no cervical adenopathy.  Neurological: He is alert and oriented to person, place, and time. He has normal reflexes. No cranial nerve deficit.  Skin: Skin is warm and dry. Rash noted.  The patient does have a area on his skin  on both legs it gets red at certain times but then at other times when he is not overworked it is pale in color. There appears to be no scaling in this area. We will continue to monitor this.  Psychiatric: He has a normal mood and affect. His behavior is normal. Judgment and thought content normal.  Nursing note and vitals reviewed.  BP 119/68 (BP Location: Left Arm)   Pulse (!) 56   Temp 97.2 F (36.2 C) (Oral)   Ht 5\' 9"  (1.753 m)   Wt 170 lb (77.1 kg)   BMI 25.10 kg/m         Assessment & Plan:  1. Type 2 diabetes mellitus without complication, without long-term current use of insulin (HCC) -The most recent A1c was 6.9%. His home readings were reviewed and they have been better recently. He will continue to work aggressively on his diet  2. Essential hypertension -Blood pressure is good he will continue with current treatment taking his half of a pill every other  day. He will continue to watch his sodium intake.  3. Vitamin D deficiency -The vitamin D level was good he will continue with current treatment  4. Gastroesophageal reflux disease, esophagitis presence not specified -The patient has no complaints with reflux today and he will continue with current treatment  5. Pure hypercholesterolemia -Cholesterol numbers were excellent but triglycerides were elevated. We will try some vascepa, 1 g once daily he will check his sugars closely to make sure he doesn't have any impact on his sugars.  6. Thrombocytopenia (HCC) -No bleeding issues platelet count was good.  7. Blister of foot with infection, right, sequela -This appears to be healing well and the skin and this area is just thickened skin with no sign of any redness or infection.  8. Abdominal aortic atherosclerosis ([place]) -Continue with exercise diet and cholesterol management  Patient Instructions                       Medicare Annual Wellness Visit  [facility] and the medical providers at [facility] strive to bring you the best medical care.  In doing so we not only want to address your current medical conditions and concerns but also to detect new conditions early and prevent illness, disease and health-related problems.    Medicare offers a yearly Wellness Visit which allows our clinical staff to assess your need for preventative services including immunizations, lifestyle education, counseling to decrease risk of preventable diseases and screening for fall risk and other medical concerns.    This visit is provided free of charge (no copay) for all Medicare recipients. The clinical pharmacists at [facility] have begun to conduct these Wellness Visits which will also include a thorough review of all your medications.    As you primary medical provider recommend that you make an appointment for your Annual Wellness Visit if you have not done  so already this year.  You may set up this appointment before you leave today or you may call back ([phone]) and schedule an appointment.  Please make sure when you call that you mention that you are scheduling your Annual Wellness Visit with the clinical pharmacist so that the appointment may be made for the proper length of time.     Continue current medications. Continue good therapeutic lifestyle changes which include good diet and exercise. Fall precautions discussed with patient. If an FOBT was given today- please return  it to our front desk. If you are over 76 years old - you may need Prevnar 91 or the adult Pneumonia vaccine.  **Flu shots are available--- please call and schedule a FLU-CLINIC appointment**  After your visit with [place] today you will receive a survey in the mail or online from [employer] regarding your care with [place]. Please take a moment to fill this out. Your feedback is very important to [place] as you can help [place] better understand your patient needs as well as improve your experience and satisfaction. WE CARE ABOUT YOU!!!   Continue with aggressive therapeutic lifestyle changes Continue with current cholesterol medication Continue to monitor feet regularly Use a little mupirocin ointment on the drying blister of the right foot. Call us if there is any increased redness Continue to protect self from tick bites  Arrie Senate MD

## 2016-10-31 NOTE — Patient Instructions (Addendum)
Medicare Annual Wellness Visit  [place] and the medical providers at [facility] strive to bring you the best medical care.  In doing so we not only want to address your current medical conditions and concerns but also to detect new conditions early and prevent illness, disease and health-related problems.    Medicare offers a yearly Wellness Visit which allows our clinical staff to assess your need for preventative services including immunizations, lifestyle education, counseling to decrease risk of preventable diseases and screening for fall risk and other medical concerns.    This visit is provided free of charge (no copay) for all Medicare recipients. The clinical pharmacists at [facility] have begun to conduct these Wellness Visits which will also include a thorough review of all your medications.    As you primary medical provider recommend that you make an appointment for your Annual Wellness Visit if you have not done so already this year.  You may set up this appointment before you leave today or you may call back ([phone]) and schedule an appointment.  Please make sure when you call that you mention that you are scheduling your Annual Wellness Visit with the clinical pharmacist so that the appointment may be made for the proper length of time.     Continue current medications. Continue good therapeutic lifestyle changes which include good diet and exercise. Fall precautions discussed with patient. If an FOBT was given today- please return it to our front desk. If you are over 64 years old - you may need Prevnar 73 or the adult Pneumonia vaccine.  **Flu shots are available--- please call and schedule a FLU-CLINIC appointment**  After your visit with [place] today you will receive a survey in the mail or online from [employer] regarding your care with [place]. Please take a moment to fill this out. Your feedback is very  important to [place] as you can help [place] better understand your patient needs as well as improve your experience and satisfaction. WE CARE ABOUT YOU!!!   Continue with aggressive therapeutic lifestyle changes Continue with current cholesterol medication Continue to monitor feet regularly Use a little mupirocin ointment on the drying blister of the right foot. Call us if there is any increased redness Continue to protect self from tick bites

## 2016-11-05 ENCOUNTER — Ambulatory Visit: Payer: Medicare Other

## 2016-11-07 ENCOUNTER — Ambulatory Visit (INDEPENDENT_AMBULATORY_CARE_PROVIDER_SITE_OTHER): Payer: Medicare Other | Admitting: *Deleted

## 2016-11-07 VITALS — BP 117/64 | HR 80 | Temp 97.0°F | Ht 68.5 in | Wt 168.4 lb

## 2016-11-07 DIAGNOSIS — Z0000 Encounter for general adult medical examination without abnormal findings: Secondary | ICD-10-CM | POA: Diagnosis not present

## 2016-11-07 NOTE — Patient Instructions (Signed)
  Joe [name] , Thank you for taking time to come for your Medicare Wellness Visit. I appreciate your ongoing commitment to your health goals. Please review the following plan we discussed and let me know if I can assist you in the future.   These are the goals we discussed: Goals    None      This is a list of the screening recommended for you and due dates:  Health Maintenance  Topic Date Due  . Flu Shot  01/01/2017  . Hemoglobin A1C  05/01/2017  . Stool Blood Test  06/13/2017  . Eye exam for diabetics  08/07/2017  . Complete foot exam   10/31/2017  . Colon Cancer Screening  04/07/2019  . Tetanus Vaccine  03/03/2021  . Pneumonia vaccines  Completed

## 2016-11-08 LAB — MICROALBUMIN / CREATININE URINE RATIO: Creatinine, Urine: 63.9 mg/dL

## 2016-11-08 NOTE — Progress Notes (Signed)
Subjective:   Joe [name] is a 76 y.o. male who presents for Medicare Annual/Subsequent preventive examination. He has been married for 35 years to is wife and has one son. 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 He reports that his health is about the same as last year. He enjoys working in the yard and he makes Health and safety inspector out of [employer]. He has started walking again with his wife since the weather is warmer. They walk 3-4 times a week but he has developed a small blister on the bottom of his  right little toe. Dr. Wendi Snipes did look at this for him and suggested a bandaid or moleskin. He does report that he is going to try a different type of walking shoe to see if that will help. We discussed the importance of checking his feet daily since he was diabetic. He enjoys shagging a Charity fundraiser 2 Fridays a month.He tries to follow a diabetic diet but sometimes he has to cheat a little . He checks his blood sugar BID and reports that it has been under good control. He has recently had bilateral cataract surgery by Dr Alanda Slim and is doing well.        Objective:    Vitals: BP 117/64 (BP Location: Right Arm, Patient Position: Sitting, Cuff Size: Normal)   Pulse 80   Temp 97 F (36.1 C) (Oral)   Ht 5' 8.5" (1.74 m)   Wt 168 lb 6.4 oz (76.4 kg)   BMI 25.23 kg/m   Body mass index is 25.23 kg/m.  Tobacco History  Smoking Status  . Former Smoker  . Packs/day: 2.00  . Types: Cigarettes  . Start date: 06/03/1954  . Quit date: 08/24/1984  Smokeless Tobacco  . Never Used    Comment: Has not used to tobacco products for the past 20 years     He has not smoked for the past 20 years. He quit in 1986.   Past Medical History:  Diagnosis Date  . Arthritis   . Cataract   . Chest pain at rest 05/26/2016  . Colon polyps   . Diabetes mellitus without complication ([place])   . Dyslipidemia   . ESOPHAGITIS, REFLUX 07/09/2004   Qualifier: Diagnosis of  By:  Hardin Negus CMA (AAMA), [place]    . Hiatal hernia   . HOH (hard of hearing)    has bilateral Hearing aids  . Hypertension   . Irritable bowel syndrome   . Kidney stone 1970  . Pneumonia   . Tick bites    took 5 rounds of Doxycycline this summer   Past Surgical History:  Procedure Laterality Date  . COLONOSCOPY    . ESOPHAGEAL DILATION  1996  . ESOPHAGEAL DILATION  2003  . FLEXIBLE SIGMOIDOSCOPY    . HEMICOLECTOMY  08/2005   Right  . KNEE SURGERY Right   . TONSILLECTOMY     Family History  Problem Relation Age of Onset  . Stroke Father 44  . Hyperlipidemia Father   . Hypertension Father   . Heart disease Brother   . Cancer Brother        lung   . Hyperlipidemia Brother   . Hypertension Brother   . Stroke Brother   . Diabetes Brother   . Breast cancer Mother   . Colon cancer Neg Hx   . Colon polyps Neg Hx   . Kidney disease Neg Hx   . Esophageal cancer  Neg Hx   . Stomach cancer Neg Hx   . Rectal cancer Neg Hx    History  Sexual Activity  . Sexual activity: Yes  . Partners: Female    Outpatient Encounter Prescriptions as of 11/07/2016  Medication Sig  . aspirin 81 MG tablet Take 81 mg by mouth daily.    . cholecalciferol (VITAMIN D) 1000 UNITS tablet Take 2,000 Units by mouth daily.   . Cinnamon 500 MG TABS Take 1,000 tablets by mouth 2 (two) times daily.  Marland Kitchen doxycycline (VIBRA-TABS) 100 MG tablet Take 1 tablet (100 mg total) by mouth 2 (two) times daily. 1 po bid  . doxylamine, Sleep, (UNISOM) 25 MG tablet Take 25 mg by mouth at bedtime as needed for sleep.   Marland Kitchen esomeprazole (NEXIUM) 40 MG capsule TAKE (1) CAPSULE DAILY  . glucose blood (ONE TOUCH ULTRA TEST) test strip Check BS qid  Dx: E11.9  . JANUVIA 100 MG tablet TAKE 1 TABLET DAILY  . JARDIANCE 10 MG TABS tablet Take 10 mg by mouth daily.  . metFORMIN (GLUCOPHAGE) 1000 MG tablet Take 1 and 1/2 tablets each morning and 1 tablet each evening.  . mupirocin ointment (BACTROBAN) 2 % Apply 1 application topically  2 (two) times daily. (Patient taking differently: Apply 1 application topically 2 (two) times daily as needed (rash). )  . ONE TOUCH ULTRA TEST test strip CHECK BLOOD SUGAR 4 TIMES A DAY  . Probiotic Product (PROBIOTIC PO) Take 1 tablet by mouth daily.  . simvastatin (ZOCOR) 40 MG tablet TAKE 1 TABLET EVERY EVENING  . triamcinolone ointment (KENALOG) 0.5 % Apply 1 application topically 2 (two) times daily.  . valsartan-hydrochlorothiazide (DIOVAN-HCT) 160-12.5 MG tablet Take 1 tablet by mouth daily.  . silver sulfADIAZINE (SILVADENE) 1 % cream Apply 1 application topically daily. (Patient not taking: Reported on 11/07/2016)   No facility-administered encounter medications on file as of 11/07/2016.     Activities of Daily Living See flow sheet completed. No problems with performing ADL's .  Patient Care Team: Chipper Herb, MD as PCP - General (Family Medicine) Irine Seal, MD (Urology) Gatha Mayer, MD (Gastroenterology) Minus Breeding, MD as Consulting Physician (Cardiology) Alanda Slim Neena Rhymes, MD as Consulting Physician (Ophthalmology)   Assessment:    Exercise Activities and Dietary recommendations  Walks 3 times a week with his wife. Tries to follow a diabetic diet as good as he can. Checks FSBS BID   Current Dietary habits:               Breakfast - usually high fiber cereal             Lunch - sometimes skip, sandwich, vienna sausages             Supper - eats out several times per month.                          Grilled hamburgers, french fries (a few times per week)                         Chicken salad with grapes and mayo                         Meat and vegetables             Snack - 2 or 3 sugar free cookies;   Fall Risk Fall Risk  10/31/2016  10/17/2016 10/08/2016 06/04/2016 05/20/2016  Falls in the past year? Yes No No No No  Number falls in past yr: 1 - - - -  Injury with Fall? No - - - -   Depression Screen PHQ 2/9 Scores 10/31/2016 10/17/2016 10/08/2016 06/04/2016    PHQ - 2 Score 0 0 0 0  PHQ- 9 Score - - - -    Cognitive Function MMSE - Mini Mental State Exam 11/01/2015  Orientation to time 5  Orientation to Place 5  Registration 3  Attention/ Calculation 5  Recall 2  Language- name 2 objects 2  Language- repeat 1  Language- follow 3 step command 3  Language- read & follow direction 1  Write a sentence 1  Copy design 1  Total score 29        Immunization History  Administered Date(s) Administered  . Influenza, High Dose Seasonal PF 03/28/2016  . Influenza,inj,Quad PF,36+ Mos 03/23/2013, 03/26/2014, 03/17/2015  . Pneumococcal Conjugate-13 06/08/2013  . Pneumococcal Polysaccharide-23 04/03/2008   Screening Tests Health Maintenance  Topic Date Due  . INFLUENZA VACCINE  01/01/2017  . HEMOGLOBIN A1C  05/01/2017  . COLON CANCER SCREENING ANNUAL FOBT  06/13/2017  . OPHTHALMOLOGY EXAM  08/07/2017  . FOOT EXAM  10/31/2017  . COLONOSCOPY  04/07/2019  . TETANUS/TDAP  03/03/2021  . PNA vac Low Risk Adult  Completed      Plan:  Follow up with Dr. Laurance Flatten on 03/07/17 or sooner if he needs [place]. Urine Microalbumin obtained. Needs shringrix vaccine and I will call him when it comes in. Keep a check on the blister on his toe  I have personally reviewed and noted the following in the patient's chart:   . Medical and social history . Use of alcohol, tobacco or illicit drugs  . Current medications and supplements . Functional ability and status . Nutritional status . Physical activity . Advanced directives . List of other physicians . Hospitalizations, surgeries, and ER visits in previous 12 months . Vitals . Screenings to include cognitive, depression, and falls . Referrals and appointments  In addition, I have reviewed and discussed with patient certain preventive protocols, quality metrics, and best practice recommendations. A written personalized care plan for preventive services as well as general preventive health recommendations were  provided to patient.     Torrie Mayers, RN  11/08/2016  I have reviewed and agree with the above AWV documentation.   Evelina Dun, FNP

## 2016-11-18 ENCOUNTER — Other Ambulatory Visit: Payer: Self-pay | Admitting: Family Medicine

## 2016-12-12 ENCOUNTER — Ambulatory Visit: Payer: Medicare Other | Admitting: Physician Assistant

## 2016-12-19 ENCOUNTER — Other Ambulatory Visit: Payer: Self-pay | Admitting: Family Medicine

## 2016-12-31 ENCOUNTER — Other Ambulatory Visit: Payer: Self-pay | Admitting: Family Medicine

## 2017-01-16 ENCOUNTER — Telehealth: Payer: Self-pay | Admitting: *Deleted

## 2017-01-17 ENCOUNTER — Other Ambulatory Visit: Payer: Self-pay | Admitting: Nurse Practitioner

## 2017-01-17 MED ORDER — OLMESARTAN MEDOXOMIL-HCTZ 40-12.5 MG PO TABS: 1.0000 | ORAL_TABLET | Freq: Every day | ORAL | 1 refills | Status: DC

## 2017-01-17 NOTE — Progress Notes (Signed)
Valsartan changed to benicar

## 2017-01-27 ENCOUNTER — Other Ambulatory Visit: Payer: Self-pay | Admitting: Family Medicine

## 2017-01-27 ENCOUNTER — Telehealth: Payer: Self-pay | Admitting: Family Medicine

## 2017-01-27 NOTE — Telephone Encounter (Signed)
Try 1/2, monitor bps closely and get bmp in a couple of weeks, check bp by nurse then and review home readings. He would be getting a little more ARB and a little less diuretic, but hopefully would balance out

## 2017-01-27 NOTE — Telephone Encounter (Signed)
valtartan was 160/12.5 - pt was only taking 1/2 tab every other day  benicar is 40/12.5 one a day -- how should he REALLY take.

## 2017-01-27 NOTE — Telephone Encounter (Signed)
Call patient with recommendation and follow-up

## 2017-01-28 ENCOUNTER — Other Ambulatory Visit: Payer: Self-pay | Admitting: *Deleted

## 2017-01-28 DIAGNOSIS — I10 Essential (primary) hypertension: Secondary | ICD-10-CM

## 2017-01-28 NOTE — Telephone Encounter (Signed)
Pt aware.

## 2017-02-24 ENCOUNTER — Other Ambulatory Visit: Payer: Self-pay | Admitting: Family Medicine

## 2017-02-26 ENCOUNTER — Other Ambulatory Visit: Payer: Medicare Other

## 2017-02-26 DIAGNOSIS — I10 Essential (primary) hypertension: Secondary | ICD-10-CM

## 2017-02-27 LAB — BMP8+EGFR
BUN/Creatinine Ratio: 16 (ref 10–24)
BUN: 22 mg/dL (ref 8–27)
CO2: 20 mmol/L (ref 20–29)
Calcium: 9.3 mg/dL (ref 8.6–10.2)
Chloride: 99 mmol/L (ref 96–106)
Creatinine, Ser: 1.35 mg/dL — ABNORMAL HIGH (ref 0.76–1.27)
GFR calc Af Amer: 59 mL/min/{1.73_m2} — ABNORMAL LOW (ref >59)
GFR calc non Af Amer: 51 mL/min/{1.73_m2} — ABNORMAL LOW (ref >59)
GLUCOSE: 242 mg/dL — AB (ref 65–99)
POTASSIUM: 4.5 mmol/L (ref 3.5–5.2)
SODIUM: 139 mmol/L (ref 134–144)

## 2017-02-28 NOTE — Telephone Encounter (Signed)
change diovan to another medication

## 2017-03-07 ENCOUNTER — Ambulatory Visit (INDEPENDENT_AMBULATORY_CARE_PROVIDER_SITE_OTHER): Payer: Medicare Other | Admitting: Family Medicine

## 2017-03-07 ENCOUNTER — Encounter: Payer: Self-pay | Admitting: Family Medicine

## 2017-03-07 VITALS — BP 131/74 | HR 60 | Temp 97.1°F | Ht 68.5 in | Wt 169.0 lb

## 2017-03-07 DIAGNOSIS — Z23 Encounter for immunization: Secondary | ICD-10-CM | POA: Diagnosis not present

## 2017-03-07 DIAGNOSIS — E119 Type 2 diabetes mellitus without complications: Secondary | ICD-10-CM

## 2017-03-07 DIAGNOSIS — Z0000 Encounter for general adult medical examination without abnormal findings: Secondary | ICD-10-CM | POA: Diagnosis not present

## 2017-03-07 DIAGNOSIS — N400 Enlarged prostate without lower urinary tract symptoms: Secondary | ICD-10-CM

## 2017-03-07 DIAGNOSIS — I10 Essential (primary) hypertension: Secondary | ICD-10-CM

## 2017-03-07 DIAGNOSIS — E559 Vitamin D deficiency, unspecified: Secondary | ICD-10-CM

## 2017-03-07 DIAGNOSIS — E7800 Pure hypercholesterolemia, unspecified: Secondary | ICD-10-CM

## 2017-03-07 DIAGNOSIS — K219 Gastro-esophageal reflux disease without esophagitis: Secondary | ICD-10-CM

## 2017-03-07 DIAGNOSIS — D696 Thrombocytopenia, unspecified: Secondary | ICD-10-CM

## 2017-03-07 LAB — MICROSCOPIC EXAMINATION
BACTERIA UA: NONE SEEN
Epithelial Cells (non renal): NONE SEEN /hpf (ref 0–10)
RBC MICROSCOPIC, UA: NONE SEEN /HPF (ref 0–?)
RENAL EPITHEL UA: NONE SEEN /HPF
WBC UA: NONE SEEN /HPF (ref 0–?)

## 2017-03-07 LAB — URINALYSIS, COMPLETE
Bilirubin, UA: NEGATIVE
Ketones, UA: NEGATIVE
Leukocytes, UA: NEGATIVE
NITRITE UA: NEGATIVE
PH UA: 5 (ref 5.0–7.5)
PROTEIN UA: NEGATIVE
RBC, UA: NEGATIVE
Specific Gravity, UA: 1.015 (ref 1.005–1.030)
Urobilinogen, Ur: 0.2 mg/dL (ref 0.2–1.0)

## 2017-03-07 LAB — BAYER DCA HB A1C WAIVED: HB A1C: 7.4 % — AB (ref <7.0)

## 2017-03-07 MED ORDER — BLOOD GLUCOSE MONITORING SUPPL W/DEVICE KIT: PACK | 1 refills | Status: DC

## 2017-03-07 NOTE — Progress Notes (Signed)
Subjective:    [name], male    DOB: Dec 20, 1940, 76 y.o.   MRN: [id number]  HPI Patient is here today for annual wellness exam and follow up of chronic medical problems which includes diabetes, hyperlipemia and hypertension. He is taking medication regularly. The patient is doing well overall with no specific complaints. He is due today to have a PSA and rectal exam. He is due to get lab work done. He'll also get a urinalysis and he is going to delay getting his flu shot. His vital signs are stable and his weight is up 1 pound from the previous visit. The patient brings in blood sugars from the outside and these are being checked before breakfast and at bedtime. These numbers run in the morning anywhere from the 120s up to as high as 262 at times. More recently they're running closer to the 170s and 180s in the morning and evening blood sugars are running anywhere from the low 140s to the 220 range. These will be scanned into the record. Blood sugars are running generally in the 1:15 range over the 50-60 range. The patient's BMI is around 25. He has not been as active physically and looking at his blood sugars over the past 2 or 3 months and have gone up since June. I encouraged increased activity and still maintain is good diet habits. He will try to do this. He denies any chest pain or shortness of breath. He denies any trouble with his hiatal hernia recently because he is taking Nexium regularly. He denies any nausea vomiting diarrhea blood in the stool or black tarry bowel movements. He's passing his water without problems. His blood pressures and blood sugars have been brought in for review as mentioned and they will be scanned into the record.     Patient Active Problem List   Diagnosis Date Noted  . Chest pain at rest 05/26/2016  . Thrombocytopenia ([place]) 03/18/2015  . Sebaceous cyst 01/23/2015  . Atopic dermatitis 01/23/2015  . Foot lesion 01/16/2015  . Thoracic disc  herniation 06/20/2014  . Left varicocele 06/20/2014  . Abdominal aortic atherosclerosis ([place]) 06/20/2014  . Ventral hernia without obstruction or gangrene 02/04/2014  . Personal history of colonic polyps 02/04/2014  . Hyperlipemia 12/30/2012  . Hypertension 12/30/2012  . BPH (benign prostatic hypertrophy) 12/30/2012  . Diabetes type 2, controlled ([place]) 09/28/2012  . RBBB 04/11/2010  . CARDIOVASCULAR FUNCTION STUDY, ABNORMAL 04/11/2010  . Diaphragmatic hernia 09/14/2007  . NEPHROLITHIASIS 09/14/2007   Outpatient Encounter Prescriptions as of 03/07/2017  Medication Sig  . aspirin 81 MG tablet Take 81 mg by mouth daily.    . cholecalciferol (VITAMIN D) 1000 UNITS tablet Take 2,000 Units by mouth daily.   . Cinnamon 500 MG TABS Take 1,000 tablets by mouth 2 (two) times daily.  Marland Kitchen doxycycline (VIBRA-TABS) 100 MG tablet Take 1 tablet (100 mg total) by mouth 2 (two) times daily. 1 po bid  . doxylamine, Sleep, (UNISOM) 25 MG tablet Take 25 mg by mouth at bedtime as needed for sleep.   Marland Kitchen esomeprazole (NEXIUM) 40 MG capsule TAKE (1) CAPSULE DAILY  . glucose blood (ONE TOUCH ULTRA TEST) test strip Check BS qid  Dx: E11.9  . JANUVIA 100 MG tablet TAKE 1 TABLET DAILY  . JARDIANCE 10 MG TABS tablet Take 10 mg by mouth daily.  . metFORMIN (GLUCOPHAGE) 1000 MG tablet Take 1 and 1/2 tablets each morning and 1 tablet each evening.  . mupirocin ointment (  BACTROBAN) 2 % Apply 1 application topically 2 (two) times daily. (Patient taking differently: Apply 1 application topically 2 (two) times daily as needed (rash). )  . olmesartan-hydrochlorothiazide (BENICAR HCT) 40-12.5 MG tablet Take 1 tablet by mouth daily.  . ONE TOUCH ULTRA TEST test strip CHECK BLOOD SUGAR 4 TIMES A DAY  . Probiotic Product (PROBIOTIC PO) Take 1 tablet by mouth daily.  . silver sulfADIAZINE (SILVADENE) 1 % cream Apply 1 application topically daily.  . simvastatin (ZOCOR) 40 MG tablet TAKE 1 TABLET EVERY EVENING  . triamcinolone  ointment (KENALOG) 0.5 % Apply 1 application topically 2 (two) times daily.   No facility-administered encounter medications on file as of 03/07/2017.      Review of Systems  Constitutional: Negative.   HENT: Negative.   Eyes: Negative.   Respiratory: Negative.   Cardiovascular: Negative.   Gastrointestinal: Negative.   Endocrine: Negative.   Genitourinary: Negative.   Musculoskeletal: Negative.   Skin: Negative.   Allergic/Immunologic: Negative.   Neurological: Negative.   Hematological: Negative.   Psychiatric/Behavioral: Negative.        Objective:   Physical Exam  Constitutional: He is oriented to person, place, and time. He appears well-developed and well-nourished. No distress.  The patient is calm and pleasant and alert  HENT:  Head: Normocephalic and atraumatic.  Right Ear: External ear normal.  Left Ear: External ear normal.  Nose: Nose normal.  Mouth/Throat: Oropharynx is clear and moist. No oropharyngeal exudate.  Eyes: Pupils are equal, round, and reactive to light. Conjunctivae and EOM are normal. Right eye exhibits no discharge. Left eye exhibits no discharge. No scleral icterus.  Neck: Normal range of motion. Neck supple. No thyromegaly present.  No bruits thyromegaly or anterior cervical adenopathy  Cardiovascular: Normal rate, regular rhythm, normal heart sounds and intact distal pulses.   No murmur heard. The heart has a regular rate and rhythm at 60/m  Pulmonary/Chest: Effort normal and breath sounds normal. No respiratory distress. He has no wheezes. He has no rales. He exhibits no tenderness.  No axillary adenopathy and chest is clear anteriorly and posteriorly  Abdominal: Soft. Bowel sounds are normal. He exhibits no mass. There is no tenderness. There is no rebound and no guarding.  No abdominal tenderness masses or organ enlargement or bruits and no inguinal adenopathy  Genitourinary: Rectum normal, prostate normal and penis normal.  Genitourinary  Comments: The prostate is enlarged but soft and smooth. There were no rectal masses. External genitalia were within normal limits with no inguinal hernias and no abnormalities noted.  Musculoskeletal: Normal range of motion. He exhibits no edema.  Lymphadenopathy:    He has no cervical adenopathy.  Neurological: He is alert and oriented to person, place, and time. He has normal reflexes. No cranial nerve deficit.  Skin: Skin is warm and dry. No rash noted.  Psychiatric: He has a normal mood and affect. His behavior is normal. Judgment and thought content normal.  Nursing note and vitals reviewed.   BP 131/74 (BP Location: Left Arm)   Pulse 60   Temp (!) 97.1 F (36.2 C) (Oral)   Ht 5' 8.5" (1.74 m)   Wt 169 lb (76.7 kg)   BMI 25.32 kg/m        Assessment & Plan:  1. Annual physical exam - CBC with Differential/Platelet - BMP8+EGFR - Lipid panel - VITAMIN D 25 Hydroxy (Vit-D Deficiency, Fractures) - Hepatic function panel - Bayer DCA Hb A1c Waived - PSA, total and free -  Thyroid Panel With TSH  2. Type 2 diabetes mellitus without complication, without long-term current use of insulin ([place]) -Stay more active physically and eat healthy - CBC with Differential/Platelet  3. Essential hypertension -The blood pressure is good today and he will continue with current treatment - CBC with Differential/Platelet - BMP8+EGFR - Hepatic function panel  4. Vitamin D deficiency -[place] current treatment pending results of lab work - CBC with Differential/Platelet - VITAMIN D 25 Hydroxy (Vit-D Deficiency, Fractures)  5. Gastroesophageal reflux disease, esophagitis presence not specified -Continue Nexium and healthy eating - CBC with Differential/Platelet - Hepatic function panel  6. Pure hypercholesterolemia -Continue with current treatment and aggressive therapeutic lifestyle changes - CBC with Differential/Platelet - Lipid panel  7. Thrombocytopenia ([facility]) -No bleeding  history - CBC with Differential/Platelet  8. Encounter for immunization - Flu vaccine HIGH DOSE PF  9. Benign prostatic hyperplasia without lower urinary tract symptoms -Prostate is enlarged but soft and smooth the patient is having no symptoms with voiding.

## 2017-03-07 NOTE — Patient Instructions (Addendum)
Medicare Annual Wellness Visit  [place] and the medical providers at [facility] strive to bring you the best medical care.  In doing so we not only want to address your current medical conditions and concerns but also to detect new conditions early and prevent illness, disease and health-related problems.    Medicare offers a yearly Wellness Visit which allows our clinical staff to assess your need for preventative services including immunizations, lifestyle education, counseling to decrease risk of preventable diseases and screening for fall risk and other medical concerns.    This visit is provided free of charge (no copay) for all Medicare recipients. The clinical pharmacists at [facility] have begun to conduct these Wellness Visits which will also include a thorough review of all your medications.    As you primary medical provider recommend that you make an appointment for your Annual Wellness Visit if you have not done so already this year.  You may set up this appointment before you leave today or you may call back ([phone]) and schedule an appointment.  Please make sure when you call that you mention that you are scheduling your Annual Wellness Visit with the clinical pharmacist so that the appointment may be made for the proper length of time.     Continue current medications. Continue good therapeutic lifestyle changes which include good diet and exercise. Fall precautions discussed with patient. If an FOBT was given today- please return it to our front desk. If you are over 28 years old - you may need Prevnar 73 or the adult Pneumonia vaccine.  **Flu shots are available--- please call and schedule a FLU-CLINIC appointment**  After your visit with [place] today you will receive a survey in the mail or online from [employer] regarding your care with [place]. Please take a moment to fill this out. Your feedback is very  important to [place] as you can help [place] better understand your patient needs as well as improve your experience and satisfaction. WE CARE ABOUT YOU!!!   Do not do things that she shouldn't do because of your age! Bring blood sugars by for review in about 6 weeks and try to correlate history with the blood sugars and try to get more exercise

## 2017-03-08 LAB — BMP8+EGFR
BUN/Creatinine Ratio: 12 (ref 10–24)
BUN: 19 mg/dL (ref 8–27)
CALCIUM: 9 mg/dL (ref 8.6–10.2)
CHLORIDE: 104 mmol/L (ref 96–106)
CO2: 20 mmol/L (ref 20–29)
Creatinine, Ser: 1.57 mg/dL — ABNORMAL HIGH (ref 0.76–1.27)
GFR calc non Af Amer: 42 mL/min/{1.73_m2} — ABNORMAL LOW (ref >59)
GFR, EST AFRICAN AMERICAN: 49 mL/min/{1.73_m2} — AB (ref >59)
Glucose: 132 mg/dL — ABNORMAL HIGH (ref 65–99)
Potassium: 4.3 mmol/L (ref 3.5–5.2)
Sodium: 139 mmol/L (ref 134–144)

## 2017-03-08 LAB — CBC WITH DIFFERENTIAL/PLATELET
BASOS: 1 %
Basophils Absolute: 0 10*3/uL (ref 0.0–0.2)
EOS (ABSOLUTE): 0.4 10*3/uL (ref 0.0–0.4)
EOS: 6 %
Hematocrit: 33.9 % — ABNORMAL LOW (ref 37.5–51.0)
Hemoglobin: 11.6 g/dL — ABNORMAL LOW (ref 13.0–17.7)
IMMATURE GRANULOCYTES: 0 %
Immature Grans (Abs): 0 10*3/uL (ref 0.0–0.1)
LYMPHS ABS: 1.1 10*3/uL (ref 0.7–3.1)
Lymphs: 19 %
MCH: 31.8 pg (ref 26.6–33.0)
MCHC: 34.2 g/dL (ref 31.5–35.7)
MCV: 93 fL (ref 79–97)
MONOS ABS: 0.6 10*3/uL (ref 0.1–0.9)
Monocytes: 10 %
NEUTROS PCT: 64 %
Neutrophils Absolute: 3.6 10*3/uL (ref 1.4–7.0)
PLATELETS: 134 10*3/uL — AB (ref 150–379)
RBC: 3.65 x10E6/uL — ABNORMAL LOW (ref 4.14–5.80)
RDW: 13.5 % (ref 12.3–15.4)
WBC: 5.6 10*3/uL (ref 3.4–10.8)

## 2017-03-08 LAB — THYROID PANEL WITH TSH
FREE THYROXINE INDEX: 2.3 (ref 1.2–4.9)
T3 Uptake Ratio: 32 % (ref 24–39)
T4 TOTAL: 7.2 ug/dL (ref 4.5–12.0)
TSH: 0.918 u[IU]/mL (ref 0.450–4.500)

## 2017-03-08 LAB — HEPATIC FUNCTION PANEL
ALBUMIN: 4.2 g/dL (ref 3.5–4.8)
ALK PHOS: 47 IU/L (ref 39–117)
ALT: 10 IU/L (ref 0–44)
AST: 17 IU/L (ref 0–40)
BILIRUBIN, DIRECT: 0.16 mg/dL (ref 0.00–0.40)
Bilirubin Total: 0.5 mg/dL (ref 0.0–1.2)
TOTAL PROTEIN: 6.3 g/dL (ref 6.0–8.5)

## 2017-03-08 LAB — LIPID PANEL
CHOL/HDL RATIO: 2.8 ratio (ref 0.0–5.0)
CHOLESTEROL TOTAL: 107 mg/dL (ref 100–199)
HDL: 38 mg/dL — AB (ref >39)
LDL Calculated: 46 mg/dL (ref 0–99)
Triglycerides: 114 mg/dL (ref 0–149)
VLDL Cholesterol Cal: 23 mg/dL (ref 5–40)

## 2017-03-08 LAB — VITAMIN D 25 HYDROXY (VIT D DEFICIENCY, FRACTURES): Vit D, 25-Hydroxy: 45.2 ng/mL (ref 30.0–100.0)

## 2017-03-08 LAB — PSA, TOTAL AND FREE
PROSTATE SPECIFIC AG, SERUM: 2.1 ng/mL (ref 0.0–4.0)
PSA, Free Pct: 27.6 %
PSA, Free: 0.58 ng/mL

## 2017-03-19 ENCOUNTER — Encounter: Payer: Self-pay | Admitting: Family Medicine

## 2017-03-19 ENCOUNTER — Ambulatory Visit (INDEPENDENT_AMBULATORY_CARE_PROVIDER_SITE_OTHER): Payer: Medicare Other | Admitting: Family Medicine

## 2017-03-19 VITALS — BP 119/62 | HR 59 | Temp 96.9°F | Ht 68.5 in | Wt 167.0 lb

## 2017-03-19 DIAGNOSIS — E119 Type 2 diabetes mellitus without complications: Secondary | ICD-10-CM

## 2017-03-19 DIAGNOSIS — I10 Essential (primary) hypertension: Secondary | ICD-10-CM | POA: Diagnosis not present

## 2017-03-19 DIAGNOSIS — R634 Abnormal weight loss: Secondary | ICD-10-CM | POA: Diagnosis not present

## 2017-03-19 DIAGNOSIS — M25551 Pain in right hip: Secondary | ICD-10-CM

## 2017-03-19 NOTE — Progress Notes (Signed)
Subjective:    [name], male    DOB: 1941-05-27, 76 y.o.   MRN: [id number]  HPI Patient here today for elevated Blood sugars and to discuss some medications. The patient comes with his wife to the visit today. They have questions about taking jardiance  and also about Benicar and kidney function. The visit was basically a consultation with questions regarding Benicar and a blood sugar medicine which excretes through the kidneys into the bladder. The outcome of this discussion was positive for the patient and his wife. The patient's BMI is close to 25. He has concerns that any more diet restriction will cause him to lose more weight and he does not want to lose more weight. The patient's wife says that the weight loss seems to have stabilized. As far as the weight loss is concerned he did have a colonoscopy in 2015 it is not do another one until 2020. He seems to feel pretty good about where he is currently and we discussed the importance of exercising but eating healthy to control the blood sugar as a last A1c was 7.4%. The urine did have 3+ glucose but he is taking a medicine that causes him to excrete sugar in the urine and he understands this. The outcome of this entire discussion, native with the fact that we will schedule him with a diabetic educator to further answer questions that he has regarding weight loss and exercise and good blood sugar control. Since we no longer have one in the office we will make arrangements to have him meet with someone either that comes to the office for someone with a cone system in [place]. There will be no change in medications he will stay on his current treatment plan. Both the patient and the patient's wife seemed to be satisfied with the conversation regarding the angiotensin receptor blocker and the blood sugar medicine. They understand that it is important to take either an ACE inhibitor or in our because of having diabetes. The patient also  complained of right posterior hip pain near the hamstrings insertion. He was tender at this area. We discussed the fact that if this continues we will set him up an appointment with the orthopedic surgeon to further evaluate this.    Patient Active Problem List   Diagnosis Date Noted  . Chest pain at rest 05/26/2016  . Thrombocytopenia ([place]) 03/18/2015  . Sebaceous cyst 01/23/2015  . Atopic dermatitis 01/23/2015  . Foot lesion 01/16/2015  . Thoracic disc herniation 06/20/2014  . Left varicocele 06/20/2014  . Abdominal aortic atherosclerosis ([place]) 06/20/2014  . Ventral hernia without obstruction or gangrene 02/04/2014  . Personal history of colonic polyps 02/04/2014  . Hyperlipemia 12/30/2012  . Hypertension 12/30/2012  . BPH (benign prostatic hypertrophy) 12/30/2012  . Diabetes type 2, controlled ([place]) 09/28/2012  . RBBB 04/11/2010  . CARDIOVASCULAR FUNCTION STUDY, ABNORMAL 04/11/2010  . Diaphragmatic hernia 09/14/2007  . NEPHROLITHIASIS 09/14/2007   Outpatient Encounter Prescriptions as of 03/19/2017  Medication Sig  . aspirin 81 MG tablet Take 81 mg by mouth daily.    . Blood Glucose Monitoring Suppl w/Device KIT Test BS BID and PRN dx E11.9. Give test strips and lancets to match machine given  . cholecalciferol (VITAMIN D) 1000 UNITS tablet Take 2,000 Units by mouth daily.   . Cinnamon 500 MG TABS Take 1,000 tablets by mouth 2 (two) times daily.  Marland Kitchen doxylamine, Sleep, (UNISOM) 25 MG tablet Take 25 mg by mouth at bedtime  as needed for sleep.   Marland Kitchen esomeprazole (NEXIUM) 40 MG capsule TAKE (1) CAPSULE DAILY  . glucose blood (ONE TOUCH ULTRA TEST) test strip Check BS qid  Dx: E11.9  . JANUVIA 100 MG tablet TAKE 1 TABLET DAILY  . JARDIANCE 10 MG TABS tablet Take 10 mg by mouth daily.  . metFORMIN (GLUCOPHAGE) 1000 MG tablet Take 1 and 1/2 tablets each morning and 1 tablet each evening.  . mupirocin ointment (BACTROBAN) 2 % Apply 1 application topically 2 (two) times daily. (Patient  taking differently: Apply 1 application topically 2 (two) times daily as needed (rash). )  . olmesartan-hydrochlorothiazide (BENICAR HCT) 40-12.5 MG tablet Take 1 tablet by mouth daily.  . ONE TOUCH ULTRA TEST test strip CHECK BLOOD SUGAR 4 TIMES A DAY  . Probiotic Product (PROBIOTIC PO) Take 1 tablet by mouth daily.  . simvastatin (ZOCOR) 40 MG tablet TAKE 1 TABLET EVERY EVENING  . triamcinolone ointment (KENALOG) 0.5 % Apply 1 application topically 2 (two) times daily.  . [DISCONTINUED] doxycycline (VIBRA-TABS) 100 MG tablet Take 1 tablet (100 mg total) by mouth 2 (two) times daily. 1 po bid  . [DISCONTINUED] silver sulfADIAZINE (SILVADENE) 1 % cream Apply 1 application topically daily.   No facility-administered encounter medications on file as of 03/19/2017.       Review of Systems  Constitutional: Negative.   HENT: Negative.   Eyes: Negative.   Respiratory: Negative.   Cardiovascular: Negative.   Gastrointestinal: Negative.   Endocrine: Negative.        Elevated BS   Genitourinary: Negative.   Musculoskeletal: Negative.   Skin: Negative.   Allergic/Immunologic: Negative.   Neurological: Negative.   Hematological: Negative.   Psychiatric/Behavioral: Negative.        Objective:   Physical Exam  Constitutional: He is oriented to person, place, and time. He appears well-developed and well-nourished. No distress.  HENT:  Head: Normocephalic and atraumatic.  Eyes: Pupils are equal, round, and reactive to light. Conjunctivae and EOM are normal. Right eye exhibits no discharge. Left eye exhibits no discharge. No scleral icterus.  Neck: Normal range of motion.  Musculoskeletal: Normal range of motion. He exhibits no edema.  Neurological: He is alert and oriented to person, place, and time.  Skin: Skin is warm and dry. No rash noted.  Psychiatric: He has a normal mood and affect. His behavior is normal. Judgment and thought content normal.  Nursing note and vitals  reviewed.  BP 119/62 (BP Location: Left Arm)   Pulse (!) 59   Temp (!) 96.9 F (36.1 C) (Oral)   Ht 5' 8.5" (1.74 m)   Wt 167 lb (75.8 kg)   BMI 25.02 kg/m   Please see the information discussed in the patient history section. We also discussed with the patient and his wife that if the weight loss continues that I would recommend getting a CT scan of the abdomen and we will wait and monitor the need for this at the next visit.      Assessment & Plan:  1. Essential hypertension -The blood pressure was good today and he will continue with current treatment  2. Type 2 diabetes mellitus without complication, without long-term current use of insulin (HCC) -The last hemoglobin A1c was 7.4% he will continue with current treatment but at the same time try to get more exercise and eat healthy to keep his weight maintained.  3. Weight loss -Eat healthy and continue with good exercise regimen -If weight loss continues patient  agrees to get CT scan of abdomen  4. Right hip pain -If right hip pain continues we will arrange an appointment with orthopedic specialist and get x-ray here  Patient Instructions  If weight loss continues patient agrees to get CT scan of abdomen If hip pain persists we will plan to get an x-ray of the right hip and a referral to the orthopedic surgeon for further evaluation The patient will try to get more exercise and eat more volume but more healthy volume to maintain weight but to keep blood sugar under the best control possible We will arrange for him to meet with a diabetic educator either in this office or in the current system.  Arrie Senate MD

## 2017-03-19 NOTE — Patient Instructions (Signed)
If weight loss continues patient agrees to get CT scan of abdomen If hip pain persists we will plan to get an x-ray of the right hip and a referral to the orthopedic surgeon for further evaluation The patient will try to get more exercise and eat more volume but more healthy volume to maintain weight but to keep blood sugar under the best control possible We will arrange for him to meet with a diabetic educator either in this office or in the current system.

## 2017-03-27 ENCOUNTER — Other Ambulatory Visit: Payer: Self-pay | Admitting: Family Medicine

## 2017-04-28 IMAGING — CT CT HEART SCORING
2 series · 16 of 20 positions shown, 18 images · non-contrast
Comparison: None.

CLINICAL DATA: Risk stratification

EXAM:
Coronary Calcium Score
TECHNIQUE: The patient was scanned on a Siemens Sensation 16 slice scanner.
Axial non-contrast 3mm slices were carried out through the heart.
The data set was analyzed on a dedicated work station and scored
using the Agatson method.

[Series 2: casc 3.0 i36f 2 bestdiast 72 % · axial · 0.37mm/px · z∈[-270,-168]mm · 8 of 44 slices shown, 10 images]
[im 5/44  vessel]
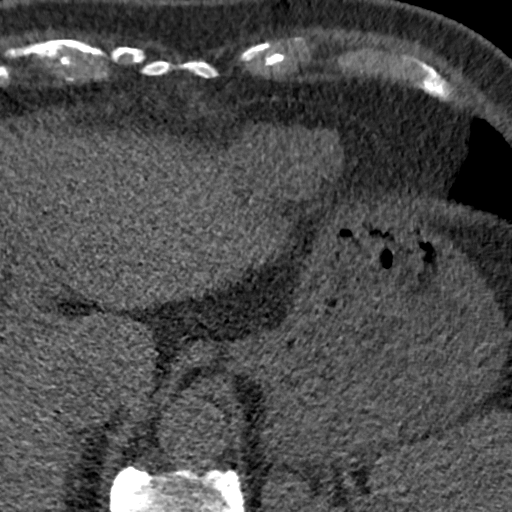
[im 5/44  lung]
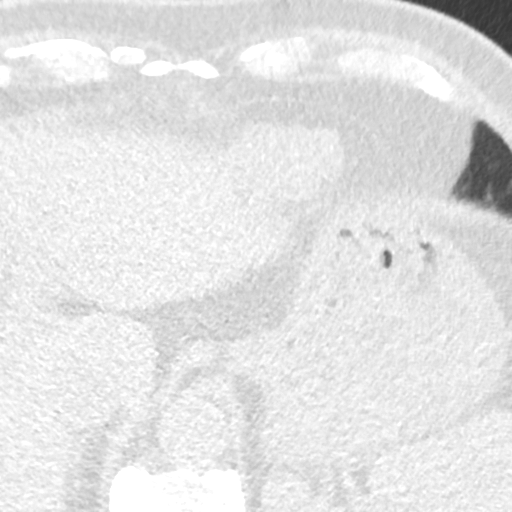
[im 10/44  vessel]
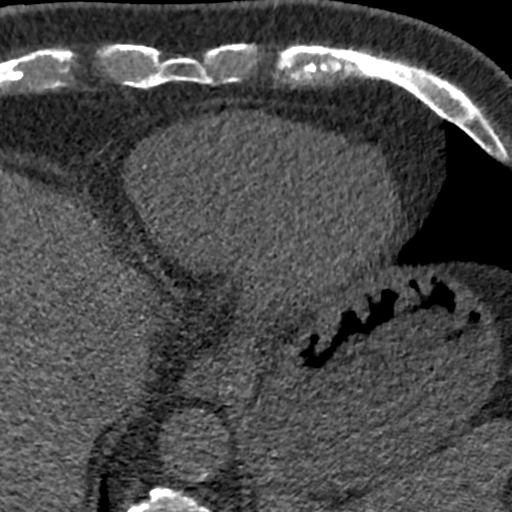
[im 15/44  vessel]
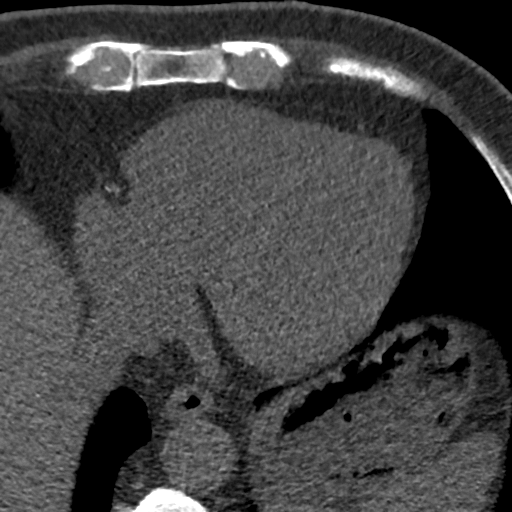
[im 20/44  vessel]
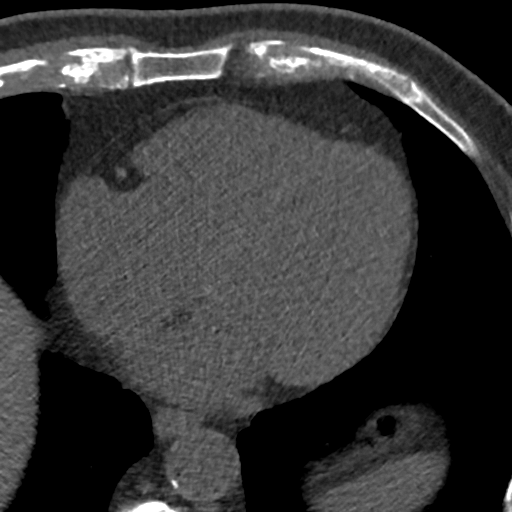
[im 24/44  vessel]
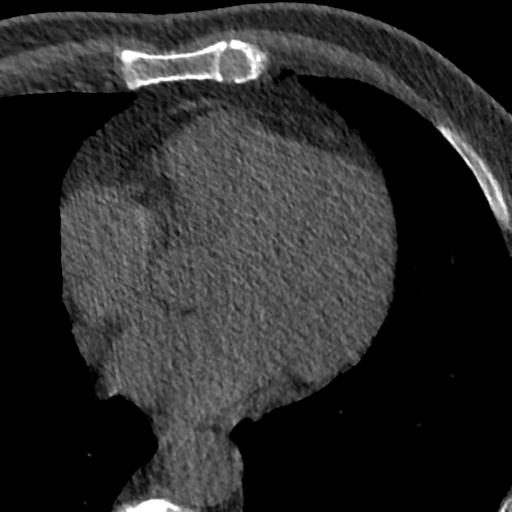
[im 24/44  lung]
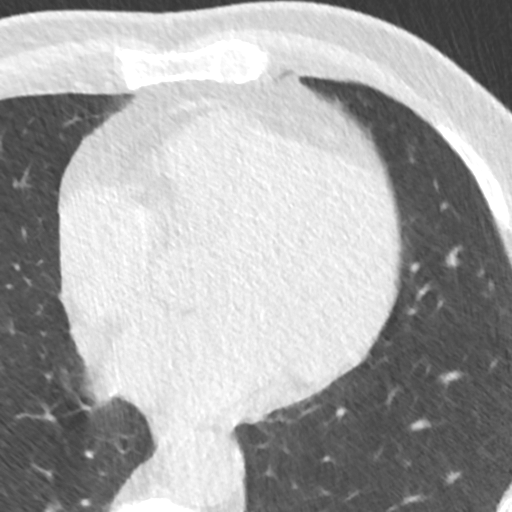
[im 29/44  vessel]
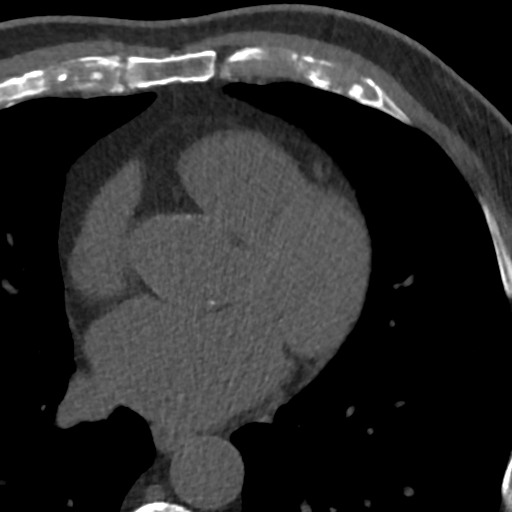
[im 34/44  vessel]
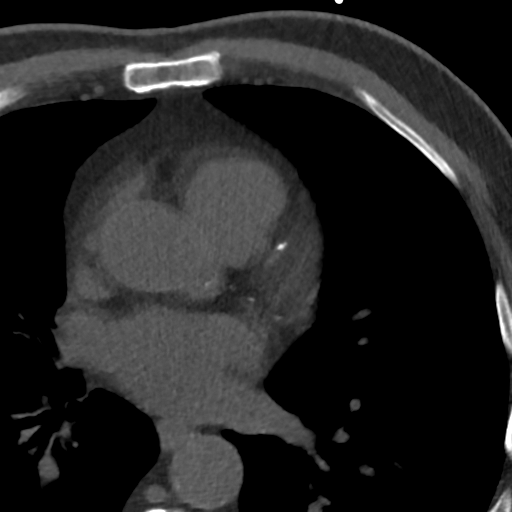
[im 39/44  vessel]
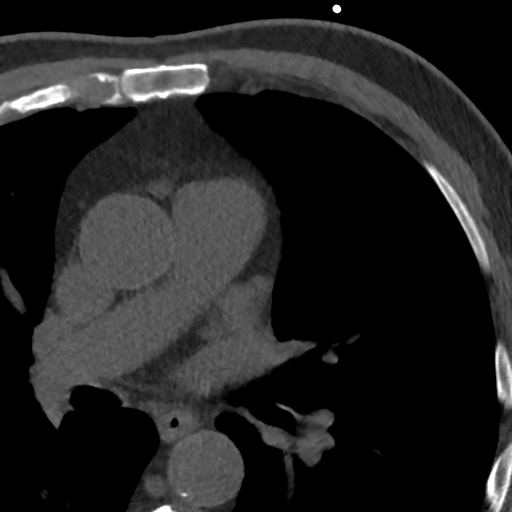

[Series 4: lung st 71 % · axial · 0.69mm/px · z∈[-270,-168]mm · 8 of 44 slices shown]
[im 5/44  lung]
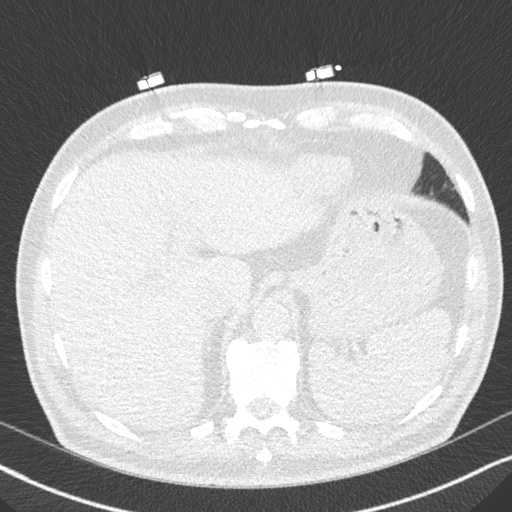
[im 10/44  lung]
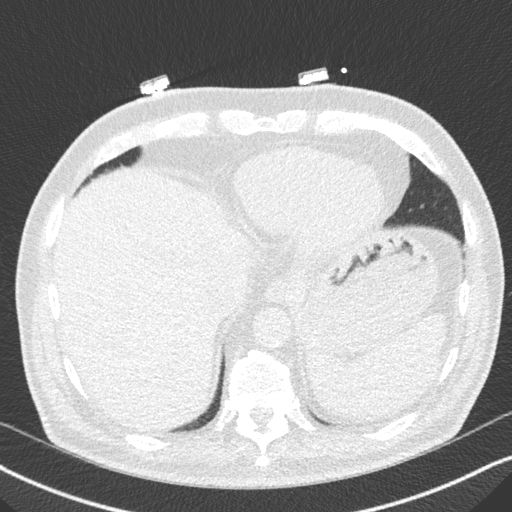
[im 15/44  lung]
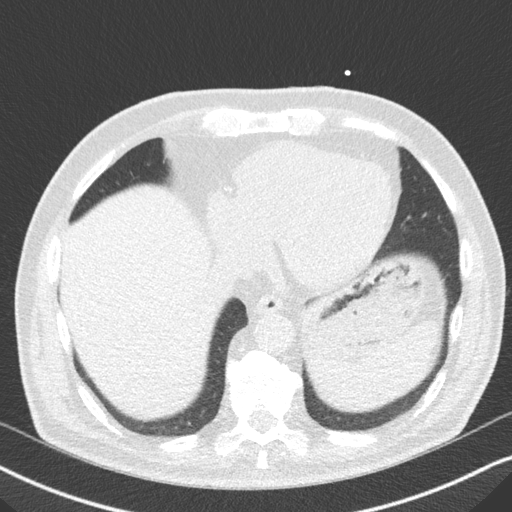
[im 20/44  lung]
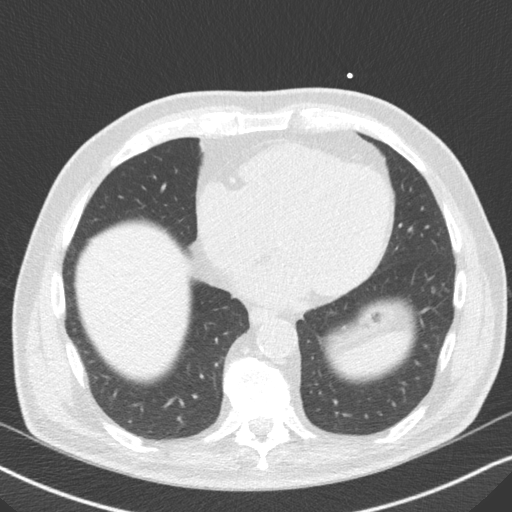
[im 24/44  lung]
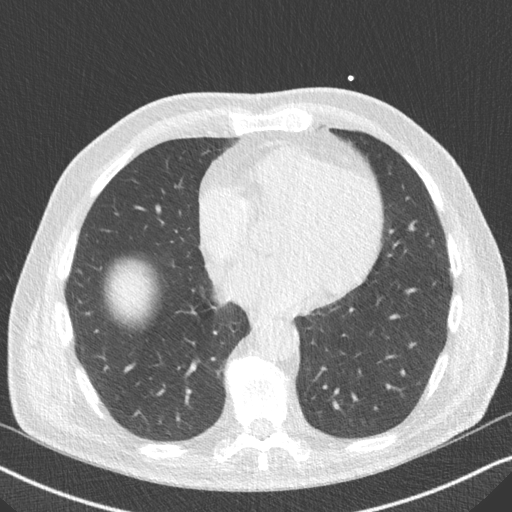
[im 29/44  lung]
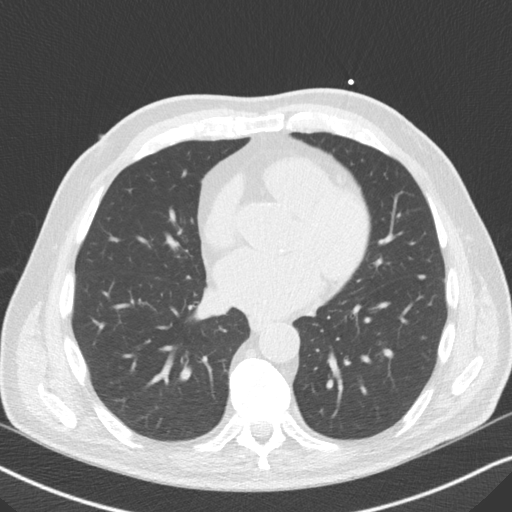
[im 34/44  lung]
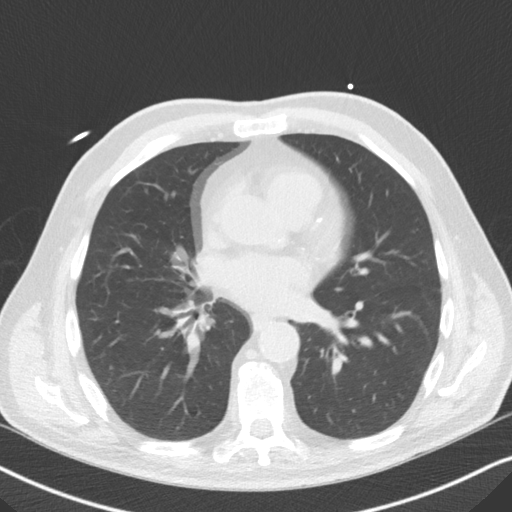
[im 39/44  lung]
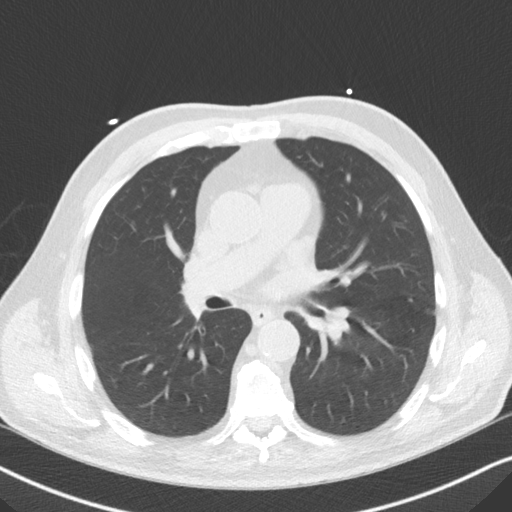

[16 of 20 positions shown; findings below may reference images not displayed]

FINDINGS: Non-cardiac: No significant non cardiac findings on limited lung and
soft tissue windows. See separate report from [REDACTED].

Ascending Aorta:  3.5 cm

Pericardium: Normal

Coronary arteries: Punctate calcium seen in LM, LAD and proximal
circumflex
IMPRESSION: Coronary calcium score of 207. This was 54th percentile for age and
sex matched control.

Abdhul Delos Reyes

EXAM:
OVER-READ INTERPRETATION  CT CHEST

The following report is an over-read performed by radiologist Dr.
over-read does not include interpretation of cardiac or coronary
anatomy or pathology. The coronary calcium score interpretation by
the cardiologist is attached.
FINDINGS: Visualized pulmonary parenchyma clear. No pleural effusions. No
pathologic mediastinal or hilar lymphadenopathy. Mild
atherosclerosis involving descending thoracic aorta without
aneurysm. Visualized extreme upper abdomen unremarkable for the
unenhanced technique. Degenerative changes and DISH involving the
visualized thoracic spine.
IMPRESSION: Mild thoracic aortic atherosclerosis without aneurysm. No
significant extracardiac abnormalities otherwise apart from the
skeletal findings detailed above.

## 2017-05-23 ENCOUNTER — Other Ambulatory Visit: Payer: Self-pay | Admitting: Family Medicine

## 2017-05-30 ENCOUNTER — Other Ambulatory Visit: Payer: Self-pay | Admitting: Family Medicine

## 2017-06-02 ENCOUNTER — Other Ambulatory Visit: Payer: Self-pay | Admitting: Family Medicine

## 2017-07-02 ENCOUNTER — Other Ambulatory Visit: Payer: Self-pay | Admitting: Family Medicine

## 2017-07-07 ENCOUNTER — Other Ambulatory Visit: Payer: Self-pay | Admitting: Family Medicine

## 2017-07-18 ENCOUNTER — Other Ambulatory Visit: Payer: Self-pay | Admitting: *Deleted

## 2017-07-18 ENCOUNTER — Telehealth: Payer: Self-pay | Admitting: *Deleted

## 2017-07-18 DIAGNOSIS — E7849 Other hyperlipidemia: Secondary | ICD-10-CM

## 2017-07-18 DIAGNOSIS — I10 Essential (primary) hypertension: Secondary | ICD-10-CM

## 2017-07-18 DIAGNOSIS — D696 Thrombocytopenia, unspecified: Secondary | ICD-10-CM

## 2017-07-18 DIAGNOSIS — E1122 Type 2 diabetes mellitus with diabetic chronic kidney disease: Secondary | ICD-10-CM

## 2017-07-18 NOTE — Telephone Encounter (Signed)
Pt coming in on sat for labs B[facility] per  [facility] Orders entered in [facility]

## 2017-07-19 ENCOUNTER — Other Ambulatory Visit: Payer: Medicare Other

## 2017-07-19 DIAGNOSIS — E7849 Other hyperlipidemia: Secondary | ICD-10-CM

## 2017-07-19 DIAGNOSIS — E1122 Type 2 diabetes mellitus with diabetic chronic kidney disease: Secondary | ICD-10-CM

## 2017-07-19 DIAGNOSIS — D696 Thrombocytopenia, unspecified: Secondary | ICD-10-CM

## 2017-07-19 DIAGNOSIS — I10 Essential (primary) hypertension: Secondary | ICD-10-CM

## 2017-07-21 LAB — BAYER DCA HB A1C WAIVED: HB A1C (BAYER DCA - WAIVED): 7.7 % — ABNORMAL HIGH (ref <7.0)

## 2017-07-24 LAB — LIPID PANEL
CHOLESTEROL TOTAL: 129 mg/dL (ref 100–199)
Chol/HDL Ratio: 3.1 ratio (ref 0.0–5.0)
HDL: 41 mg/dL (ref >39)
LDL CALC: 55 mg/dL (ref 0–99)
Triglycerides: 163 mg/dL — ABNORMAL HIGH (ref 0–149)
VLDL Cholesterol Cal: 33 mg/dL (ref 5–40)

## 2017-07-24 LAB — CBC WITH DIFFERENTIAL/PLATELET
BASOS ABS: 0.1 10*3/uL (ref 0.0–0.2)
BASOS: 1 %
EOS (ABSOLUTE): 0.5 10*3/uL — AB (ref 0.0–0.4)
Eos: 9 %
Hematocrit: 34.8 % — ABNORMAL LOW (ref 37.5–51.0)
Hemoglobin: 12.2 g/dL — ABNORMAL LOW (ref 13.0–17.7)
IMMATURE GRANS (ABS): 0 10*3/uL (ref 0.0–0.1)
IMMATURE GRANULOCYTES: 0 %
Lymphocytes Absolute: 1.1 10*3/uL (ref 0.7–3.1)
Lymphs: 20 %
MCH: 31.7 pg (ref 26.6–33.0)
MCHC: 35.1 g/dL (ref 31.5–35.7)
MCV: 90 fL (ref 79–97)
MONOS ABS: 0.5 10*3/uL (ref 0.1–0.9)
Monocytes: 9 %
NEUTROS PCT: 61 %
Neutrophils Absolute: 3.2 10*3/uL (ref 1.4–7.0)
PLATELETS: 122 10*3/uL — AB (ref 150–379)
RBC: 3.85 x10E6/uL — AB (ref 4.14–5.80)
RDW: 13.4 % (ref 12.3–15.4)
WBC: 5.2 10*3/uL (ref 3.4–10.8)

## 2017-07-24 LAB — BASIC METABOLIC PANEL
BUN/Creatinine Ratio: 14 (ref 10–24)
BUN: 17 mg/dL (ref 8–27)
CHLORIDE: 98 mmol/L (ref 96–106)
CO2: 21 mmol/L (ref 20–29)
Calcium: 9.6 mg/dL (ref 8.6–10.2)
Creatinine, Ser: 1.22 mg/dL (ref 0.76–1.27)
GFR calc Af Amer: 66 mL/min/{1.73_m2} (ref >59)
GFR, EST NON AFRICAN AMERICAN: 57 mL/min/{1.73_m2} — AB (ref >59)
GLUCOSE: 134 mg/dL — AB (ref 65–99)
POTASSIUM: 4.2 mmol/L (ref 3.5–5.2)
SODIUM: 136 mmol/L (ref 134–144)

## 2017-07-24 LAB — HEPATIC FUNCTION PANEL
ALT: 14 IU/L (ref 0–44)
AST: 13 IU/L (ref 0–40)
Albumin: 4.4 g/dL (ref 3.5–4.8)
Alkaline Phosphatase: 51 IU/L (ref 39–117)
BILIRUBIN TOTAL: 0.6 mg/dL (ref 0.0–1.2)
BILIRUBIN, DIRECT: 0.14 mg/dL (ref 0.00–0.40)
TOTAL PROTEIN: 6.6 g/dL (ref 6.0–8.5)

## 2017-07-29 ENCOUNTER — Ambulatory Visit: Payer: Medicare Other | Admitting: Family Medicine

## 2017-07-29 ENCOUNTER — Encounter: Payer: Self-pay | Admitting: Family Medicine

## 2017-07-29 VITALS — BP 115/67 | HR 65 | Temp 97.0°F | Ht 68.5 in | Wt 162.0 lb

## 2017-07-29 DIAGNOSIS — I10 Essential (primary) hypertension: Secondary | ICD-10-CM | POA: Diagnosis not present

## 2017-07-29 DIAGNOSIS — E119 Type 2 diabetes mellitus without complications: Secondary | ICD-10-CM | POA: Diagnosis not present

## 2017-07-29 DIAGNOSIS — D696 Thrombocytopenia, unspecified: Secondary | ICD-10-CM | POA: Diagnosis not present

## 2017-07-29 DIAGNOSIS — K219 Gastro-esophageal reflux disease without esophagitis: Secondary | ICD-10-CM

## 2017-07-29 DIAGNOSIS — R710 Precipitous drop in hematocrit: Secondary | ICD-10-CM

## 2017-07-29 DIAGNOSIS — N400 Enlarged prostate without lower urinary tract symptoms: Secondary | ICD-10-CM | POA: Diagnosis not present

## 2017-07-29 DIAGNOSIS — E7800 Pure hypercholesterolemia, unspecified: Secondary | ICD-10-CM

## 2017-07-29 DIAGNOSIS — E559 Vitamin D deficiency, unspecified: Secondary | ICD-10-CM

## 2017-07-29 DIAGNOSIS — H6123 Impacted cerumen, bilateral: Secondary | ICD-10-CM

## 2017-07-29 LAB — GLUCOSE HEMOCUE WAIVED: GLU HEMOCUE WAIVED: 238 mg/dL — AB (ref 65–99)

## 2017-07-29 MED ORDER — INTEGRA PLUS PO CAPS: 1.0000 | ORAL_CAPSULE | Freq: Every day | ORAL | 3 refills | Status: DC

## 2017-07-29 MED ORDER — BLOOD GLUCOSE MONITORING SUPPL W/DEVICE KIT: PACK | 1 refills | Status: AC

## 2017-07-29 NOTE — Progress Notes (Signed)
Subjective:    [name], male    DOB: February 11, 1941, 77 y.o.   MRN: [id number]  HPI Pt here for follow up and management of chronic medical problems which includes diabetes, hyperlipidemia and hypertension. He is taking medication regularly.  The patient is doing well overall.  He has had problems with the blood sugar being higher than it should be and we will address that during the visit today.  He has had lab work done in the most recent blood sugar was 134.  The creatinine remains within normal limits at 1.22 all of the electrolytes including potassium are good.  Hemoglobin A1c was 7.7%.  9 months ago this was 6.9% and closer to the goal that we would like.  All liver function tests were normal.  All cholesterol numbers other than triglycerides were good and within normal limits.  The LDL C was excellent at 55.  HDL was within normal limits at 41.  Triglycerides were elevated at 163.  The white blood cell count was normal.  Hemoglobin remains slightly decreased but consistent with past readings at 12.2.  The platelet count was decreased and similar to past readings.  The patient today complains of his ears being stopped up.  The patient is pleasant and doing well overall and would like to be more physically active but has been kept from doing this because of the weather and his wife that is concerned that something might happen to him.  He denies any chest pain or shortness of breath anymore than usual.  He denies any trouble with swallowing heartburn indigestion nausea vomiting diarrhea or blood in the stool.  He is passing his water without problems.  He does complain of his ears being stopped up.  We will look at this today and if necessary do an ear irrigation to remove cerumen.     Patient Active Problem List   Diagnosis Date Noted  . Chest pain at rest 05/26/2016  . Thrombocytopenia ([place]) 03/18/2015  . Sebaceous cyst 01/23/2015  . Atopic dermatitis 01/23/2015  . Foot lesion  01/16/2015  . Thoracic disc herniation 06/20/2014  . Left varicocele 06/20/2014  . Abdominal aortic atherosclerosis ([place]) 06/20/2014  . Ventral hernia without obstruction or gangrene 02/04/2014  . Personal history of colonic polyps 02/04/2014  . Hyperlipemia 12/30/2012  . Hypertension 12/30/2012  . BPH (benign prostatic hypertrophy) 12/30/2012  . Diabetes type 2, controlled ([place]) 09/28/2012  . RBBB 04/11/2010  . CARDIOVASCULAR FUNCTION STUDY, ABNORMAL 04/11/2010  . Diaphragmatic hernia 09/14/2007  . NEPHROLITHIASIS 09/14/2007   Outpatient Encounter Medications as of 07/29/2017  Medication Sig  . aspirin 81 MG tablet Take 81 mg by mouth daily.    . Blood Glucose Monitoring Suppl w/Device KIT Test BS BID and PRN dx E11.9. Give test strips and lancets to match machine given  . cholecalciferol (VITAMIN D) 1000 UNITS tablet Take 2,000 Units by mouth daily.   . Cinnamon 500 MG TABS Take 1,000 tablets by mouth 2 (two) times daily.  Marland Kitchen doxylamine, Sleep, (UNISOM) 25 MG tablet Take 25 mg by mouth at bedtime as needed for sleep.   Marland Kitchen esomeprazole (NEXIUM) 40 MG capsule TAKE (1) CAPSULE DAILY  . glucose blood (ONE TOUCH ULTRA TEST) test strip Check BS qid  Dx: E11.9  . JANUVIA 100 MG tablet TAKE 1 TABLET DAILY  . JARDIANCE 10 MG TABS tablet Take 10 mg by mouth daily.  . metFORMIN (GLUCOPHAGE) 1000 MG tablet Take 1 and 1/2 tablets each  morning and 1 tablet each evening.  . mupirocin ointment (BACTROBAN) 2 % Apply 1 application topically 2 (two) times daily. (Patient taking differently: Apply 1 application topically 2 (two) times daily as needed (rash). )  . olmesartan-hydrochlorothiazide (BENICAR HCT) 40-12.5 MG tablet Take 1 tablet by mouth daily.  . ONE TOUCH ULTRA TEST test strip CHECK BLOOD SUGAR 4 TIMES A DAY  . Probiotic Product (PROBIOTIC PO) Take 1 tablet by mouth daily.  . simvastatin (ZOCOR) 40 MG tablet TAKE 1 TABLET EVERY EVENING  . triamcinolone ointment (KENALOG) 0.5 % Apply 1  application topically 2 (two) times daily.   No facility-administered encounter medications on file as of 07/29/2017.      Review of Systems  Constitutional: Negative.   HENT: Positive for ear discharge (stopped up).   Eyes: Negative.   Respiratory: Negative.   Cardiovascular: Negative.   Gastrointestinal: Positive for diarrhea (frequent).  Endocrine: Negative.        BS elevated  Genitourinary: Negative.   Musculoskeletal: Negative.   Skin: Negative.   Allergic/Immunologic: Negative.   Neurological: Negative.   Hematological: Negative.   Psychiatric/Behavioral: Negative.        Objective:   Physical Exam  Constitutional: He is oriented to person, place, and time. He appears well-developed and well-nourished. No distress.  The patient is pleasant and relaxed and appears may be a little depressed.  HENT:  Head: Normocephalic and atraumatic.  Nose: Nose normal.  Mouth/Throat: Oropharynx is clear and moist. No oropharyngeal exudate.  Ear cerumen removed from both ear canals with curette  Eyes: Conjunctivae and EOM are normal. Pupils are equal, round, and reactive to light. Right eye exhibits no discharge. Left eye exhibits no discharge. No scleral icterus.  Neck: Normal range of motion. Neck supple. No tracheal deviation present. No thyromegaly present.  No bruits thyromegaly or anterior cervical adenopathy  Cardiovascular: Normal rate, regular rhythm, normal heart sounds and intact distal pulses.  No murmur heard. Heart has a regular rate and rhythm at 72/min  Pulmonary/Chest: Effort normal and breath sounds normal. No respiratory distress. He has no wheezes. He has no rales. He exhibits no tenderness.  Lungs are clear anteriorly and posteriorly and there is no axillary adenopathy.  There were no chest wall masses.  Abdominal: Soft. Bowel sounds are normal. He exhibits no mass. There is no tenderness. There is no rebound and no guarding.  Nontender without liver or spleen  enlargement no bruits and no inguinal adenopathy  Musculoskeletal: Normal range of motion. He exhibits no edema.  Lymphadenopathy:    He has no cervical adenopathy.  Neurological: He is alert and oriented to person, place, and time. He has normal reflexes. No cranial nerve deficit.  Skin: Skin is warm and dry. No rash noted.  Patient needs visit to podiatrist to trim nails on both feet  Psychiatric: He has a normal mood and affect. His behavior is normal. Judgment and thought content normal.  Nursing note and vitals reviewed.  BP 115/67 (BP Location: Left Arm)   Pulse 65   Temp (!) 97 F (36.1 C) (Oral)   Ht 5' 8.5" (1.74 m)   Wt 162 lb (73.5 kg)   BMI 24.27 kg/m         Assessment & Plan:  1. Type 2 diabetes mellitus without complication, without long-term current use of insulin ([place]) -The patient brings blood sugars in for review.  They are slightly elevated.  His machine was checked against our machine and was running higher  than our machine.  We will order a new machine and hopefully when he is able to walk and exercise more the sugars will come under better control without any additional treatment at this time. - Glucose Hemocue Waived  2. Vitamin D deficiency -Continue current treatment  3. Pure hypercholesterolemia -Continue current treatment.  If triglycerides continue to run high we will can consider trying Vascepa  4. Gastroesophageal reflux disease, esophagitis presence not specified -Continue with current treatment which is Nexium.  5. Thrombocytopenia (HCC) -The platelet count remains stable low.  And we will continue to monitor this.  6. Benign prostatic hyperplasia without lower urinary tract symptoms -No complaints today with voiding.  7. Essential hypertension -Blood pressure readings are good he will continue with current treatment  8. Decreased hemoglobin -Start Integra +1 daily and recheck CBC in 6 weeks - Ferritin - Iron  9. Impacted cerumen of  both ears -This was removed from each ear with a curette and patient tolerated procedure well.  Patient Instructions                       Medicare Annual Wellness Visit  [facility] and the medical providers at [facility] strive to bring you the best medical care.  In doing so we not only want to address your current medical conditions and concerns but also to detect new conditions early and prevent illness, disease and health-related problems.    Medicare offers a yearly Wellness Visit which allows our clinical staff to assess your need for preventative services including immunizations, lifestyle education, counseling to decrease risk of preventable diseases and screening for fall risk and other medical concerns.    This visit is provided free of charge (no copay) for all Medicare recipients. The clinical pharmacists at [facility] have begun to conduct these Wellness Visits which will also include a thorough review of all your medications.    As you primary medical provider recommend that you make an appointment for your Annual Wellness Visit if you have not done so already this year.  You may set up this appointment before you leave today or you may call back ([phone]) and schedule an appointment.  Please make sure when you call that you mention that you are scheduling your Annual Wellness Visit with the clinical pharmacist so that the appointment may be made for the proper length of time.     Continue current medications. Continue good therapeutic lifestyle changes which include good diet and exercise. Fall precautions discussed with patient. If an FOBT was given today- please return it to our front desk. If you are over 33 years old - you may need Prevnar 31 or the adult Pneumonia vaccine.  **Flu shots are available--- please call and schedule a FLU-CLINIC appointment**  After your visit with [place] today you will receive a survey in the mail  or online from [employer] regarding your care with [place]. Please take a moment to fill this out. Your feedback is very important to [place] as you can help [place] better understand your patient needs as well as improve your experience and satisfaction. WE CARE ABOUT YOU!!!   Use Debrox periodically for 2-3 nights in a row monthly just to help keep the earwax softer Repeat CBC in 6 weeks after starting Integra +1 daily Drink plenty of fluids and stay well-hydrated We will call with iron study results as soon as those results become available Try to walk and  exercise more and continue to keep blood sugars checked regularly  Please schedule podiatry visit  Arrie Senate MD

## 2017-07-29 NOTE — Patient Instructions (Addendum)
Medicare Annual Wellness Visit  [facility] and the medical providers at [facility] strive to bring you the best medical care.  In doing so we not only want to address your current medical conditions and concerns but also to detect new conditions early and prevent illness, disease and health-related problems.    Medicare offers a yearly Wellness Visit which allows our clinical staff to assess your need for preventative services including immunizations, lifestyle education, counseling to decrease risk of preventable diseases and screening for fall risk and other medical concerns.    This visit is provided free of charge (no copay) for all Medicare recipients. The clinical pharmacists at [facility] have begun to conduct these Wellness Visits which will also include a thorough review of all your medications.    As you primary medical provider recommend that you make an appointment for your Annual Wellness Visit if you have not done so already this year.  You may set up this appointment before you leave today or you may call back ([phone]) and schedule an appointment.  Please make sure when you call that you mention that you are scheduling your Annual Wellness Visit with the clinical pharmacist so that the appointment may be made for the proper length of time.     Continue current medications. Continue good therapeutic lifestyle changes which include good diet and exercise. Fall precautions discussed with patient. If an FOBT was given today- please return it to our front desk. If you are over 66 years old - you may need Prevnar 38 or the adult Pneumonia vaccine.  **Flu shots are available--- please call and schedule a FLU-CLINIC appointment**  After your visit with [place] today you will receive a survey in the mail or online from [employer] regarding your care with [place]. Please take a moment to fill this out. Your feedback is very  important to [place] as you can help [place] better understand your patient needs as well as improve your experience and satisfaction. WE CARE ABOUT YOU!!!   Use Debrox periodically for 2-3 nights in a row monthly just to help keep the earwax softer Repeat CBC in 6 weeks after starting Integra +1 daily Drink plenty of fluids and stay well-hydrated We will call with iron study results as soon as those results become available Try to walk and exercise more and continue to keep blood sugars checked regularly  Please schedule podiatry visit

## 2017-07-30 LAB — IRON: Iron: 111 ug/dL (ref 38–169)

## 2017-07-30 LAB — FERRITIN: FERRITIN: 124 ng/mL (ref 30–400)

## 2017-08-05 ENCOUNTER — Other Ambulatory Visit: Payer: Self-pay | Admitting: Family Medicine

## 2017-08-06 ENCOUNTER — Other Ambulatory Visit: Payer: Self-pay | Admitting: Family Medicine

## 2017-08-06 LAB — HM DIABETES EYE EXAM

## 2017-08-15 ENCOUNTER — Ambulatory Visit (INDEPENDENT_AMBULATORY_CARE_PROVIDER_SITE_OTHER): Payer: Medicare Other | Admitting: Pharmacist Clinician (PhC)/ Clinical Pharmacy Specialist

## 2017-08-15 DIAGNOSIS — E119 Type 2 diabetes mellitus without complications: Secondary | ICD-10-CM

## 2017-08-15 LAB — GLUCOSE HEMOCUE WAIVED: GLU HEMOCUE WAIVED: 177 mg/dL — AB (ref 65–99)

## 2017-08-15 MED ORDER — METFORMIN HCL ER 500 MG PO TB24: 750.0000 mg | ORAL_TABLET | Freq: Every day | ORAL | Status: DC

## 2017-08-15 MED ORDER — METFORMIN HCL ER 750 MG PO TB24: 750.0000 mg | ORAL_TABLET | Freq: Every day | ORAL | 3 refills | Status: DC

## 2017-08-15 MED ORDER — EMPAGLIFLOZIN 25 MG PO TABS: 25.0000 mg | ORAL_TABLET | Freq: Every day | ORAL | 3 refills | Status: DC

## 2017-08-15 NOTE — Addendum Note (Signed)
Addended by: Memory Argue on: 08/15/2017 01:22 PM   Modules accepted: Orders

## 2017-08-15 NOTE — Progress Notes (Signed)
Patient presents today for referral for diabetes management.  He has had BG readings at home that are in the 250-350mg /dL range despite his recent A1C being 7.7% and previously low 7%.  He just got a new glucometer and his BG fasting this morning was 242mg /dL on it but after eating in office today it was 177mg dL.  This reading is more in line with his A1C.  Patient has also had daily severe diarrhea with metformin and weight loss.  He is taking the IR metformin 2500mg  a day and said his [place] doctor mentioned the XR formulation.  A/P:  1.  Discontinue metformin IR and change to Metformin XR 750mg  qam after breakfast to see if GI distress resolves.  It it does not resolve, talked with patient about started basal insulin at bedtime.  He would like to gain some weight.  2.  Increase Jardiance to 25mg  a day a repeat BMP in 4 weeks.  3.  Continue Januvia 100mg  qd.  4.  Reviewed dietary habits with patient and counseled on ADA diet.  5.  Patient will bring in new meter to check against the one in office.  Total time with patient 30 minutes  Memory Argue, PharmD, CPP, FNLA

## 2017-08-15 NOTE — Addendum Note (Signed)
Addended by: Marin Olp on: 08/15/2017 04:11 PM   Modules accepted: Orders

## 2017-08-19 ENCOUNTER — Encounter: Payer: Self-pay | Admitting: Family Medicine

## 2017-08-19 ENCOUNTER — Ambulatory Visit (INDEPENDENT_AMBULATORY_CARE_PROVIDER_SITE_OTHER): Payer: Medicare Other | Admitting: Family Medicine

## 2017-08-19 ENCOUNTER — Other Ambulatory Visit: Payer: Medicare Other

## 2017-08-19 VITALS — BP 115/69 | HR 64 | Temp 97.8°F | Ht 68.5 in | Wt 162.5 lb

## 2017-08-19 DIAGNOSIS — E1165 Type 2 diabetes mellitus with hyperglycemia: Secondary | ICD-10-CM

## 2017-08-19 DIAGNOSIS — Z1212 Encounter for screening for malignant neoplasm of rectum: Secondary | ICD-10-CM

## 2017-08-19 DIAGNOSIS — E1122 Type 2 diabetes mellitus with diabetic chronic kidney disease: Secondary | ICD-10-CM

## 2017-08-19 DIAGNOSIS — N182 Chronic kidney disease, stage 2 (mild): Secondary | ICD-10-CM

## 2017-08-19 LAB — URINALYSIS
Bilirubin, UA: NEGATIVE
Ketones, UA: NEGATIVE
LEUKOCYTES UA: NEGATIVE
Nitrite, UA: NEGATIVE
Protein, UA: NEGATIVE
RBC, UA: NEGATIVE
Specific Gravity, UA: <1.005 — ABNORMAL LOW (ref 1.005–1.030)
Urobilinogen, Ur: 0.2 mg/dL (ref 0.2–1.0)
pH, UA: 5.5 (ref 5.0–7.5)

## 2017-08-19 LAB — GLUCOSE HEMOCUE WAIVED: Glu Hemocue Waived: >444 mg/dL (ref 65–99)

## 2017-08-19 MED ORDER — PEN NEEDLES 32G X 5 MM MISC: 1.0000 | Freq: Every day | 3 refills | Status: DC

## 2017-08-19 MED ORDER — INSULIN DEGLUDEC 100 UNIT/ML ~~LOC~~ SOPN: 6.0000 [IU] | PEN_INJECTOR | Freq: Every day | SUBCUTANEOUS | 3 refills | Status: DC

## 2017-08-19 NOTE — Patient Instructions (Signed)
Great to see you!  Come back in 2 weeks to see your PCP   Start tresiba 6 units daily, on mondays and fridays increase by 2 units until his fasting blood sugar is 150-200.

## 2017-08-19 NOTE — Progress Notes (Signed)
   HPI  Patient presents today hear with elelvated glucose  Pt state that his glucose thsi am was 570, on our machine it reads too high to register as a point of care. On his glucometer here it is 468.   UA shws glucose, no ketones.   No Abd pain, no change in diet.  Tolerating lower dose of metformin without diarrhea.  Good compliance with   PMH: Smoking status noted ROS: Per HPI  Objective: BP 115/69   Pulse 64   Temp 97.8 F (36.6 C) (Oral)   Ht 5' 8.5" (1.74 m)   Wt 162 lb 8 oz (73.7 kg)   BMI 24.35 kg/m  Gen: NAD, alert, cooperative with exam HEENT: NCAT CV: RRR, good S1/S2, no murmur Resp: CTABL, no wheezes, non-labored Abd: SNTND, BS present, no guarding or organomegaly Ext: No edema, warm Neuro: Alert and oriented, No gross deficits  Assessment and plan:  # T2DM Hyperglycemia, no signs of ketosis.  Start insulin, tresiba given in clinic at low dose, titrate by 2 units on mondays and fridays.  6 units.  In the future consider consolidating to metformin, jardiance, and insulin.  He is doing well on lower dose of metfomin, would not increase with borderline GFR    Laroy Apple, MD [facility] 08/19/2017, 1:07 PM

## 2017-08-20 ENCOUNTER — Encounter: Payer: Self-pay | Admitting: Family Medicine

## 2017-08-21 ENCOUNTER — Other Ambulatory Visit: Payer: Medicare Other | Admitting: *Deleted

## 2017-08-21 DIAGNOSIS — E1165 Type 2 diabetes mellitus with hyperglycemia: Secondary | ICD-10-CM

## 2017-08-21 DIAGNOSIS — Z794 Long term (current) use of insulin: Principal | ICD-10-CM

## 2017-08-21 LAB — GLUCOSE HEMOCUE WAIVED: GLU HEMOCUE WAIVED: 316 mg/dL — AB (ref 65–99)

## 2017-08-21 MED ORDER — FREESTYLE LIBRE SENSOR SYSTEM MISC: 11 refills | Status: DC

## 2017-08-21 MED ORDER — FREESTYLE LIBRE 14 DAY SENSOR MISC: 1.0000 | 11 refills | Status: DC

## 2017-08-21 NOTE — Progress Notes (Signed)
Elevated BG Checked today with pt's meter, lab and FreeStyle Libre Pt's meter  318 Lab 316 Libre  284  Pt has follow up with Dr Laurance Flatten on Tuesday Continue current meds and will rck on Tuesday per Dr Laurance Flatten

## 2017-08-22 ENCOUNTER — Other Ambulatory Visit: Payer: Self-pay | Admitting: Pharmacist Clinician (PhC)/ Clinical Pharmacy Specialist

## 2017-08-22 LAB — FECAL OCCULT BLOOD, IMMUNOCHEMICAL: Fecal Occult Bld: NEGATIVE

## 2017-08-22 MED ORDER — METFORMIN HCL ER 750 MG PO TB24: 750.0000 mg | ORAL_TABLET | Freq: Two times a day (BID) | ORAL | 3 refills | Status: DC

## 2017-08-22 NOTE — Progress Notes (Signed)
Patient had BG >500mg /dL 5 days after changing metformin from 2500mg  a day to 750mg  ER a day due to diarrhea.  Patient tolerating metformin ER well with no GI upset.  Since he was an excellent responder to metformin will increase to 1500mg  a day and continue [place] 6 units a day.  Fasting BG are in the mid 200's.  Patient is coming back early next week to see Dr. More.

## 2017-08-23 ENCOUNTER — Other Ambulatory Visit: Payer: Self-pay | Admitting: Family Medicine

## 2017-08-26 ENCOUNTER — Encounter: Payer: Self-pay | Admitting: Family Medicine

## 2017-08-26 ENCOUNTER — Other Ambulatory Visit: Payer: Self-pay | Admitting: *Deleted

## 2017-08-26 ENCOUNTER — Ambulatory Visit (INDEPENDENT_AMBULATORY_CARE_PROVIDER_SITE_OTHER): Payer: Medicare Other | Admitting: Family Medicine

## 2017-08-26 VITALS — BP 121/69 | HR 68 | Temp 97.1°F | Ht 68.5 in | Wt 162.0 lb

## 2017-08-26 DIAGNOSIS — E1122 Type 2 diabetes mellitus with diabetic chronic kidney disease: Secondary | ICD-10-CM | POA: Diagnosis not present

## 2017-08-26 MED ORDER — FREESTYLE LIBRE SENSOR SYSTEM MISC: 11 refills | Status: DC

## 2017-08-26 NOTE — Patient Instructions (Signed)
Continue with insulin and increase this to 8 units today and up to 10 units by next Tuesday. Continue monitoring blood sugars and bring readings in for review in a couple of weeks from today Eat healthy and drink plenty of water and fluids New prescription given for freestyle [name]

## 2017-08-26 NOTE — Progress Notes (Signed)
Subjective:    [name], male    DOB: [birth year], 77 y.o.   MRN: [id number]  HPI  Patient here today for follow up on diabetes.  This is a consult visit regarding the initiation of to receive a and continuing to take extended release metformin 1500 mg daily along with Januvia and Jardiance.  The patient is doing better with the extended release metformin and not having loose stools and this is a positive.  He is tolerating the insulin well and blood sugars were brought in for review and will be scanned into the record.  There has been some slight improvement but we will go ahead and increase the Tarceva to 8 units daily and go up to 10 units next week if the sugars keep running higher.  In 2 weeks from now that will bring more blood sugars in for review and will meet with a diabetic educator in 4 weeks.  The patient seems to be tolerating the insulin injections well and is having no problems with this adjustment.  Vital signs today are stable.    Patient Active Problem List   Diagnosis Date Noted  . Chest pain at rest 05/26/2016  . Thrombocytopenia ([place]) 03/18/2015  . Sebaceous cyst 01/23/2015  . Atopic dermatitis 01/23/2015  . Foot lesion 01/16/2015  . Thoracic disc herniation 06/20/2014  . Left varicocele 06/20/2014  . Abdominal aortic atherosclerosis ([place]) 06/20/2014  . Ventral hernia without obstruction or gangrene 02/04/2014  . Personal history of colonic polyps 02/04/2014  . Hyperlipemia 12/30/2012  . Hypertension 12/30/2012  . BPH (benign prostatic hypertrophy) 12/30/2012  . Diabetes type 2, controlled ([place]) 09/28/2012  . RBBB 04/11/2010  . CARDIOVASCULAR FUNCTION STUDY, ABNORMAL 04/11/2010  . Diaphragmatic hernia 09/14/2007  . NEPHROLITHIASIS 09/14/2007   Outpatient Encounter Medications as of 08/26/2017  Medication Sig  . aspirin 81 MG tablet Take 81 mg by mouth daily.    . Blood Glucose Monitoring Suppl w/Device KIT Test BS BID and PRN dx E11.9. Give test  strips and lancets to match machine given  . cholecalciferol (VITAMIN D) 1000 UNITS tablet Take 2,000 Units by mouth daily.   . Cinnamon 500 MG TABS Take 1,000 tablets by mouth 2 (two) times daily.  . Continuous Blood Gluc Sensor (FREESTYLE LIBRE SENSOR SYSTEM) MISC Monitor blood glucose at least 4 times daily changing sensor every 14 days DX E11.65  Z79.4  . doxylamine, Sleep, (UNISOM) 25 MG tablet Take 25 mg by mouth at bedtime as needed for sleep.   . empagliflozin (JARDIANCE) 25 MG TABS tablet Take 25 mg by mouth daily.  Marland Kitchen esomeprazole (NEXIUM) 40 MG capsule TAKE (1) CAPSULE DAILY  . FeFum-FePoly-FA-B Cmp-C-Biot (INTEGRA PLUS) CAPS Take 1 capsule by mouth daily.  Marland Kitchen glucose blood (ONE TOUCH ULTRA TEST) test strip Check BS qid  Dx: E11.9  . insulin degludec (TRESIBA FLEXTOUCH) 100 UNIT/ML SOPN FlexTouch Pen Inject 0.06-0.2 mLs (6-20 Units total) into the skin daily at 10 pm.  . Insulin Pen Needle (PEN NEEDLES) 32G X 5 MM MISC 1 Device by Does not apply route daily.  Marland Kitchen JANUVIA 100 MG tablet TAKE 1 TABLET DAILY  . metFORMIN (GLUCOPHAGE XR) 750 MG 24 hr tablet Take 1 tablet (750 mg total) by mouth 2 (two) times daily with a meal.  . mupirocin ointment (BACTROBAN) 2 % Apply 1 application topically 2 (two) times daily. (Patient taking differently: Apply 1 application topically 2 (two) times daily as needed (rash). )  . olmesartan-hydrochlorothiazide (BENICAR  HCT) 40-12.5 MG tablet Take 1 tablet by mouth daily.  . ONE TOUCH ULTRA TEST test strip CHECK BLOOD SUGAR 4 TIMES A DAY  . Probiotic Product (PROBIOTIC PO) Take 1 tablet by mouth daily.  . simvastatin (ZOCOR) 40 MG tablet TAKE 1 TABLET EVERY EVENING  . triamcinolone ointment (KENALOG) 0.5 % Apply 1 application topically 2 (two) times daily.   No facility-administered encounter medications on file as of 08/26/2017.       Review of Systems  Constitutional: Negative.   HENT: Negative.   Eyes: Negative.   Respiratory: Negative.     Cardiovascular: Negative.   Gastrointestinal: Negative.   Endocrine: Negative.   Genitourinary: Negative.   Musculoskeletal: Negative.   Skin: Negative.   Allergic/Immunologic: Negative.   Neurological: Negative.   Hematological: Negative.   Psychiatric/Behavioral: Negative.        Objective:   Physical Exam  Constitutional: He is oriented to person, place, and time. He appears well-developed and well-nourished.  HENT:  Head: Normocephalic and atraumatic.  Eyes: Pupils are equal, round, and reactive to light. Conjunctivae and EOM are normal. Right eye exhibits no discharge. Left eye exhibits no discharge. No scleral icterus.  Neck: Normal range of motion.  Pulmonary/Chest: Effort normal.  Musculoskeletal: Normal range of motion.  Neurological: He is alert and oriented to person, place, and time.  Skin: Skin is warm and dry. No rash noted.  Psychiatric: He has a normal mood and affect. His behavior is normal. Judgment and thought content normal.  Nursing note and vitals reviewed.  BP 121/69 (BP Location: Left Arm)   Pulse 68   Temp (!) 97.1 F (36.2 C) (Oral)   Ht 5' 8.5" (1.74 m)   Wt 162 lb (73.5 kg)   BMI 24.27 kg/m         Assessment & Plan:  1. Type 2 diabetes mellitus with diabetic chronic kidney disease, unspecified CKD stage, unspecified whether long term insulin use (HCC) -Increase insulin as directed and stay on extended release Metformin Januvia and Jardiance  Patient Instructions  Continue with insulin and increase this to 8 units today and up to 10 units by next Tuesday. Continue monitoring blood sugars and bring readings in for review in a couple of weeks from today Eat healthy and drink plenty of water and fluids New prescription given for freestyle Marianna Fuss MD

## 2017-08-29 ENCOUNTER — Other Ambulatory Visit: Payer: Self-pay | Admitting: Pharmacist Clinician (PhC)/ Clinical Pharmacy Specialist

## 2017-08-29 ENCOUNTER — Other Ambulatory Visit: Payer: Self-pay | Admitting: *Deleted

## 2017-08-29 MED ORDER — METFORMIN HCL ER 500 MG PO TB24: 2000.0000 mg | ORAL_TABLET | ORAL | 2 refills | Status: DC

## 2017-08-29 MED ORDER — METFORMIN HCL ER (OSM) 1000 MG PO TB24: 1000.0000 mg | ORAL_TABLET | Freq: Two times a day (BID) | ORAL | 3 refills | Status: DC

## 2017-08-29 MED ORDER — METFORMIN HCL ER 750 MG PO TB24: 750.0000 mg | ORAL_TABLET | Freq: Two times a day (BID) | ORAL | 3 refills | Status: DC

## 2017-08-29 NOTE — Progress Notes (Signed)
Per pt cost of Metformin ER 1000mg  was over $2500 Per Dr Laurance Flatten ok to change to Metformin ER 500mg  taking 4 tablets daily RX sent to [facility]

## 2017-09-05 ENCOUNTER — Ambulatory Visit: Payer: Medicare Other | Admitting: Family Medicine

## 2017-09-10 ENCOUNTER — Telehealth: Payer: Self-pay | Admitting: Family Medicine

## 2017-09-10 NOTE — Telephone Encounter (Signed)
Patient wife aware that he does not have to be fasting.

## 2017-09-11 ENCOUNTER — Other Ambulatory Visit: Payer: Medicare Other

## 2017-09-11 DIAGNOSIS — E119 Type 2 diabetes mellitus without complications: Secondary | ICD-10-CM

## 2017-09-12 ENCOUNTER — Telehealth: Payer: Self-pay | Admitting: *Deleted

## 2017-09-12 LAB — BMP8+EGFR
BUN / CREAT RATIO: 13 (ref 10–24)
BUN: 20 mg/dL (ref 8–27)
CO2: 21 mmol/L (ref 20–29)
CREATININE: 1.57 mg/dL — AB (ref 0.76–1.27)
Calcium: 9.4 mg/dL (ref 8.6–10.2)
Chloride: 101 mmol/L (ref 96–106)
GFR calc non Af Amer: 42 mL/min/{1.73_m2} — ABNORMAL LOW (ref >59)
GFR, EST AFRICAN AMERICAN: 49 mL/min/{1.73_m2} — AB (ref >59)
Glucose: 115 mg/dL — ABNORMAL HIGH (ref 65–99)
Potassium: 4.3 mmol/L (ref 3.5–5.2)
Sodium: 141 mmol/L (ref 134–144)

## 2017-09-12 NOTE — Telephone Encounter (Signed)
Pt is having multiple episodes of diarrhea daily due to increase of Metformin Current dosage is Metformin 500mg  2 tablets BID Toujeo 6 units daily Per Sharyn Lull, decrease Metfomin to 1 tablet q AM then 2 tablets at supper Increase Toujeo to 8 units daily If elevated BG after 3-4 days may increase to 10 units daily Pt verbalizes understanding

## 2017-09-25 ENCOUNTER — Ambulatory Visit: Payer: Medicare Other | Admitting: Nurse Practitioner

## 2017-09-25 ENCOUNTER — Encounter: Payer: Self-pay | Admitting: Nurse Practitioner

## 2017-09-25 VITALS — BP 121/66 | HR 54 | Temp 97.0°F | Ht 68.0 in | Wt 169.0 lb

## 2017-09-25 DIAGNOSIS — L237 Allergic contact dermatitis due to plants, except food: Secondary | ICD-10-CM

## 2017-09-25 MED ORDER — METHYLPREDNISOLONE ACETATE 40 MG/ML IJ SUSP
40.0000 mg | Freq: Once | INTRAMUSCULAR | Status: AC
  Administered 2017-09-25: 40 mg via INTRAMUSCULAR

## 2017-09-25 NOTE — Progress Notes (Signed)
   Subjective:    [name], male    DOB: 1941-02-08, 77 y.o.   MRN: [id number]  HPI Patient comes in today c/o itching on tops of feet. Started about a week ago- said the itching is constant and he says it has happened in the past. He has noticed some blisters on tops of feet. He has been doing a lot of yard work and does not know if he has come in contact with something. He usually does wear tennis shoes when does yard work.    Review of Systems  Constitutional: Negative for activity change and appetite change.  HENT: Negative.   Eyes: Negative for pain.  Respiratory: Negative for shortness of breath.   Cardiovascular: Negative for chest pain, palpitations and leg swelling.  Gastrointestinal: Negative for abdominal pain.  Endocrine: Negative for polydipsia.  Genitourinary: Negative.   Skin: Positive for rash (tops of both feet).  Neurological: Negative for dizziness, weakness and headaches.  Hematological: Does not bruise/bleed easily.  Psychiatric/Behavioral: Negative.   All other systems reviewed and are negative.      Objective:   Physical Exam  Constitutional: He appears well-developed and well-nourished. No distress.  Cardiovascular: Normal rate.  Pulmonary/Chest: Effort normal and breath sounds normal.  Skin: Rash (erythematous raised tiny lesions on tops of both feet with ruprured vesicular lesions on top of left foot at baseof toes.) noted.  Psychiatric: He has a normal mood and affect. His behavior is normal. Judgment and thought content normal.  BP 121/66   Pulse (!) 54   Temp (!) 97 F (36.1 C) (Oral)   Ht 5\' 8"  (1.727 m)   Wt 169 lb (76.7 kg)   BMI 25.70 kg/m         Assessment & Plan:   1. Allergic contact dermatitis due to plants, except food    Meds ordered this encounter  Medications  . methylPREDNISolone acetate (DEPO-MEDROL) injection 40 mg   Avoid scratching Topical benadryl cream or oral benadryl OTC for itching Cool  compresses Watch for signs of infection RTO prn  Mary-Margaret Hassell Done, FNP

## 2017-09-25 NOTE — Patient Instructions (Signed)
Poison Ivy Dermatitis Poison ivy dermatitis is redness and soreness (inflammation) of the skin. It is caused by a chemical that is found on the leaves of the poison ivy plant. You may also have itching, a rash, and blisters. Symptoms often clear up in 1-2 weeks. You may get this condition by touching a poison ivy plant. You can also get it by touching something that has the chemical on it. This may include animals or objects that have come in contact with the plant. Follow these instructions at home: General instructions  Take or apply over-the-counter and prescription medicines only as told by your doctor.  If you touch poison ivy, wash your skin with soap and cold water right away.  Use hydrocortisone creams or calamine lotion as needed to help with itching.  Take oatmeal baths as needed. Use colloidal oatmeal. You can get this at a pharmacy or grocery store. Follow the instructions on the package.  Do not scratch or rub your skin.  While you have the rash, wash your clothes right after you wear them. Prevention  Know what poison ivy looks like so you can avoid it. This plant has three leaves with flowering branches on a single stem. The leaves are glossy. They have uneven edges that come to a point at the front.  If you have touched poison ivy, wash with soap and water right away. Be sure to wash under your fingernails.  When hiking or camping, wear long pants, a long-sleeved shirt, tall socks, and hiking boots. You can also use a lotion on your skin that helps to prevent contact with the chemical on the plant.  If you think that your clothes or outdoor gear came in contact with poison ivy, rinse them off with a garden hose before you bring them inside your house. Contact a doctor if:  You have open sores in the rash area.  You have more redness, swelling, or pain in the affected area.  You have redness that spreads beyond the rash area.  You have fluid, blood, or pus coming from  the affected area.  You have a fever.  You have a rash over a large area of your body.  You have a rash on your eyes, mouth, or genitals.  Your rash does not get better after a few days. Get help right away if:  Your face swells or your eyes swell shut.  You have trouble breathing.  You have trouble swallowing. This information is not intended to replace advice given to you by your health care provider. Make sure you discuss any questions you have with your health care provider. Document Released: 06/22/2010 Document Revised: 10/26/2015 Document Reviewed: 10/26/2014 Elsevier Interactive Patient Education  2018 Elsevier Inc.  

## 2017-09-29 ENCOUNTER — Encounter: Payer: Self-pay | Admitting: Family Medicine

## 2017-09-29 ENCOUNTER — Ambulatory Visit: Payer: Medicare Other | Admitting: Family Medicine

## 2017-09-29 ENCOUNTER — Ambulatory Visit (INDEPENDENT_AMBULATORY_CARE_PROVIDER_SITE_OTHER): Payer: Medicare Other | Admitting: Family Medicine

## 2017-09-29 VITALS — BP 139/63 | HR 53 | Temp 97.4°F | Ht 68.0 in | Wt 164.0 lb

## 2017-09-29 DIAGNOSIS — L03116 Cellulitis of left lower limb: Secondary | ICD-10-CM

## 2017-09-29 MED ORDER — CEPHALEXIN 500 MG PO CAPS: 500.0000 mg | ORAL_CAPSULE | Freq: Two times a day (BID) | ORAL | 0 refills | Status: DC

## 2017-09-29 NOTE — Progress Notes (Signed)
   HPI  Patient presents today with a rash on the left foot.  Patient explains he believed believes this began as an allergic reaction.  He had vesicles that he did not know about and scratched them until they were open. He was treated with IM Medrol 4 days ago with mild improvement.  He has been applying mupirocin ointment as well.  He states the area is red and slightly tender.  He is concerned about infection considering diabetes.  PMH: Smoking status noted ROS: Per HPI  Objective: BP 139/63   Pulse (!) 53   Temp (!) 97.4 F (36.3 C) (Oral)   Ht 5\' 8"  (1.727 m)   Wt 164 lb (74.4 kg)   SpO2 100%   BMI 24.94 kg/m  Gen: NAD, alert, cooperative with exam HEENT: NCAT CV: RRR, good S1/S2, no murmur Resp: CTABL, no wheezes, non-labored Ext: No edema, warm Neuro: Alert and oriented, No gross deficits Skin:  Left foot with erosion measuring 1.9 cm x 1.5 cm with surrounding erythema measuring 4 cm x 2.3 cm, no significant warmth or induration.  No drainage.   Assessment and plan:  #Cellulitis Patient with tender erythema of the left foot surrounding a previous blister. Cover with Keflex Considered shingles, however patient did have a  blister from the same episode that has healed that was in a different dermatome on the left medial foot.    Meds ordered this encounter  Medications  . cephALEXin (KEFLEX) 500 MG capsule    Sig: Take 1 capsule (500 mg total) by mouth 2 (two) times daily.    Dispense:  14 capsule    Refill:  [place], MD [facility] 09/29/2017, 11:40 AM

## 2017-09-29 NOTE — Patient Instructions (Signed)
Great to see you!   

## 2017-10-03 ENCOUNTER — Ambulatory Visit: Payer: Medicare Other | Admitting: Pharmacist Clinician (PhC)/ Clinical Pharmacy Specialist

## 2017-10-03 ENCOUNTER — Encounter: Payer: Self-pay | Admitting: Pharmacist Clinician (PhC)/ Clinical Pharmacy Specialist

## 2017-10-03 DIAGNOSIS — E119 Type 2 diabetes mellitus without complications: Secondary | ICD-10-CM

## 2017-10-03 DIAGNOSIS — Z794 Long term (current) use of insulin: Secondary | ICD-10-CM

## 2017-10-03 DIAGNOSIS — E1165 Type 2 diabetes mellitus with hyperglycemia: Secondary | ICD-10-CM | POA: Diagnosis not present

## 2017-10-03 LAB — BAYER DCA HB A1C WAIVED: HB A1C (BAYER DCA - WAIVED): 7.6 % — ABNORMAL HIGH (ref <7.0)

## 2017-10-03 NOTE — Progress Notes (Signed)
Joe [name] is a 77 year old male who is referred for a diabetic consult.  Patient has had increased blood sugar readings followiong a depomedrol shot 1 week ago.  They started to improve this morning with a fasting BG reading of 103mg /dL.  Patient is tolerating 3 tabs of metformin XR 500mg  a day, he could not take 4 tablets due to diarrhea.  A/P:  1.  Diabetes:  Checked blood glucose meter in lab against scanner because his home BG meter was 50 pts higher on a consistent basis.  Lab meter was within a couple point of his scanner so we will go with those readings.  A1C has improved some but this is still picking up the past three months prior to insulin being started.  2.  Sliding scale given to patient for how to take [place].  He will also change to give insulin in the morning verus evening.  3.  Counseled patient on diet for type 2 diabetes and answered wife's questions regarding meal preparation and concerns that patient is not eating enough protein.  Total time with patient:  45 minutes Memory Argue, PharmD, CPP, FNLA

## 2017-10-13 ENCOUNTER — Other Ambulatory Visit: Payer: Self-pay | Admitting: Family Medicine

## 2017-11-19 ENCOUNTER — Other Ambulatory Visit: Payer: Self-pay | Admitting: *Deleted

## 2017-11-19 DIAGNOSIS — K219 Gastro-esophageal reflux disease without esophagitis: Secondary | ICD-10-CM

## 2017-11-19 DIAGNOSIS — N400 Enlarged prostate without lower urinary tract symptoms: Secondary | ICD-10-CM

## 2017-11-19 DIAGNOSIS — E7800 Pure hypercholesterolemia, unspecified: Secondary | ICD-10-CM

## 2017-11-19 DIAGNOSIS — E559 Vitamin D deficiency, unspecified: Secondary | ICD-10-CM

## 2017-11-19 DIAGNOSIS — E1165 Type 2 diabetes mellitus with hyperglycemia: Secondary | ICD-10-CM

## 2017-11-19 DIAGNOSIS — I10 Essential (primary) hypertension: Secondary | ICD-10-CM

## 2017-11-19 DIAGNOSIS — Z794 Long term (current) use of insulin: Principal | ICD-10-CM

## 2017-11-19 DIAGNOSIS — D696 Thrombocytopenia, unspecified: Secondary | ICD-10-CM

## 2017-11-25 ENCOUNTER — Other Ambulatory Visit: Payer: Medicare Other

## 2017-11-25 DIAGNOSIS — N400 Enlarged prostate without lower urinary tract symptoms: Secondary | ICD-10-CM

## 2017-11-25 DIAGNOSIS — E1165 Type 2 diabetes mellitus with hyperglycemia: Secondary | ICD-10-CM

## 2017-11-25 DIAGNOSIS — E559 Vitamin D deficiency, unspecified: Secondary | ICD-10-CM

## 2017-11-25 DIAGNOSIS — D696 Thrombocytopenia, unspecified: Secondary | ICD-10-CM

## 2017-11-25 DIAGNOSIS — E7800 Pure hypercholesterolemia, unspecified: Secondary | ICD-10-CM

## 2017-11-25 DIAGNOSIS — K219 Gastro-esophageal reflux disease without esophagitis: Secondary | ICD-10-CM

## 2017-11-25 DIAGNOSIS — Z794 Long term (current) use of insulin: Principal | ICD-10-CM

## 2017-11-25 DIAGNOSIS — I10 Essential (primary) hypertension: Secondary | ICD-10-CM

## 2017-11-25 LAB — BAYER DCA HB A1C WAIVED: HB A1C: 7.7 % — AB (ref <7.0)

## 2017-11-26 LAB — CBC WITH DIFFERENTIAL/PLATELET
Basophils Absolute: 0.1 10*3/uL (ref 0.0–0.2)
Basos: 1 %
EOS (ABSOLUTE): 0.5 10*3/uL — ABNORMAL HIGH (ref 0.0–0.4)
EOS: 9 %
HEMATOCRIT: 34.1 % — AB (ref 37.5–51.0)
Hemoglobin: 11.9 g/dL — ABNORMAL LOW (ref 13.0–17.7)
Immature Grans (Abs): 0 10*3/uL (ref 0.0–0.1)
Immature Granulocytes: 0 %
LYMPHS ABS: 1.2 10*3/uL (ref 0.7–3.1)
Lymphs: 20 %
MCH: 31.4 pg (ref 26.6–33.0)
MCHC: 34.9 g/dL (ref 31.5–35.7)
MCV: 90 fL (ref 79–97)
Monocytes Absolute: 0.5 10*3/uL (ref 0.1–0.9)
Monocytes: 9 %
Neutrophils Absolute: 3.6 10*3/uL (ref 1.4–7.0)
Neutrophils: 61 %
Platelets: 126 10*3/uL — ABNORMAL LOW (ref 150–450)
RBC: 3.79 x10E6/uL — AB (ref 4.14–5.80)
RDW: 13.4 % (ref 12.3–15.4)
WBC: 5.9 10*3/uL (ref 3.4–10.8)

## 2017-11-26 LAB — BMP8+EGFR
BUN/Creatinine Ratio: 17 (ref 10–24)
BUN: 26 mg/dL (ref 8–27)
CALCIUM: 9.1 mg/dL (ref 8.6–10.2)
CO2: 22 mmol/L (ref 20–29)
Chloride: 101 mmol/L (ref 96–106)
Creatinine, Ser: 1.57 mg/dL — ABNORMAL HIGH (ref 0.76–1.27)
GFR calc Af Amer: 49 mL/min/{1.73_m2} — ABNORMAL LOW (ref >59)
GFR, EST NON AFRICAN AMERICAN: 42 mL/min/{1.73_m2} — AB (ref >59)
GLUCOSE: 143 mg/dL — AB (ref 65–99)
POTASSIUM: 4.5 mmol/L (ref 3.5–5.2)
Sodium: 136 mmol/L (ref 134–144)

## 2017-11-26 LAB — LIPID PANEL
CHOL/HDL RATIO: 2.2 ratio (ref 0.0–5.0)
Cholesterol, Total: 121 mg/dL (ref 100–199)
HDL: 54 mg/dL (ref >39)
LDL Calculated: 50 mg/dL (ref 0–99)
Triglycerides: 86 mg/dL (ref 0–149)
VLDL Cholesterol Cal: 17 mg/dL (ref 5–40)

## 2017-11-26 LAB — HEPATIC FUNCTION PANEL
ALBUMIN: 4.4 g/dL (ref 3.5–4.8)
ALK PHOS: 49 IU/L (ref 39–117)
ALT: 16 IU/L (ref 0–44)
AST: 16 IU/L (ref 0–40)
BILIRUBIN TOTAL: 0.6 mg/dL (ref 0.0–1.2)
BILIRUBIN, DIRECT: 0.21 mg/dL (ref 0.00–0.40)
Total Protein: 6.3 g/dL (ref 6.0–8.5)

## 2017-11-26 LAB — VITAMIN D 25 HYDROXY (VIT D DEFICIENCY, FRACTURES): Vit D, 25-Hydroxy: 49.3 ng/mL (ref 30.0–100.0)

## 2017-11-28 ENCOUNTER — Encounter: Payer: Self-pay | Admitting: Family Medicine

## 2017-11-28 ENCOUNTER — Ambulatory Visit: Payer: Medicare Other | Admitting: Family Medicine

## 2017-11-28 ENCOUNTER — Other Ambulatory Visit: Payer: Self-pay | Admitting: Family Medicine

## 2017-11-28 VITALS — BP 98/59 | HR 54 | Temp 97.5°F | Ht 68.0 in | Wt 168.0 lb

## 2017-11-28 DIAGNOSIS — D696 Thrombocytopenia, unspecified: Secondary | ICD-10-CM | POA: Diagnosis not present

## 2017-11-28 DIAGNOSIS — Z794 Long term (current) use of insulin: Secondary | ICD-10-CM

## 2017-11-28 DIAGNOSIS — I10 Essential (primary) hypertension: Secondary | ICD-10-CM

## 2017-11-28 DIAGNOSIS — E559 Vitamin D deficiency, unspecified: Secondary | ICD-10-CM | POA: Diagnosis not present

## 2017-11-28 DIAGNOSIS — N400 Enlarged prostate without lower urinary tract symptoms: Secondary | ICD-10-CM | POA: Diagnosis not present

## 2017-11-28 DIAGNOSIS — E7800 Pure hypercholesterolemia, unspecified: Secondary | ICD-10-CM

## 2017-11-28 DIAGNOSIS — E1165 Type 2 diabetes mellitus with hyperglycemia: Secondary | ICD-10-CM

## 2017-11-28 DIAGNOSIS — K219 Gastro-esophageal reflux disease without esophagitis: Secondary | ICD-10-CM | POA: Diagnosis not present

## 2017-11-28 NOTE — Patient Instructions (Addendum)
Medicare Annual Wellness Visit  [facility] and the medical providers at [facility] strive to bring you the best medical care.  In doing so we not only want to address your current medical conditions and concerns but also to detect new conditions early and prevent illness, disease and health-related problems.    Medicare offers a yearly Wellness Visit which allows our clinical staff to assess your need for preventative services including immunizations, lifestyle education, counseling to decrease risk of preventable diseases and screening for fall risk and other medical concerns.    This visit is provided free of charge (no copay) for all Medicare recipients. The clinical pharmacists at [facility] have begun to conduct these Wellness Visits which will also include a thorough review of all your medications.    As you primary medical provider recommend that you make an appointment for your Annual Wellness Visit if you have not done so already this year.  You may set up this appointment before you leave today or you may call back ([phone]) and schedule an appointment.  Please make sure when you call that you mention that you are scheduling your Annual Wellness Visit with the clinical pharmacist so that the appointment may be made for the proper length of time.     Continue current medications. Continue good therapeutic lifestyle changes which include good diet and exercise. Fall precautions discussed with patient. If an FOBT was given today- please return it to our front desk. If you are over 17 years old - you may need Prevnar 1 or the adult Pneumonia vaccine.  **Flu shots are available--- please call and schedule a FLU-CLINIC appointment**  After your visit with [place] today you will receive a survey in the mail or online from [employer] regarding your care with [place]. Please take a moment to fill this out. Your feedback is very  important to [place] as you can help [place] better understand your patient needs as well as improve your experience and satisfaction. WE CARE ABOUT YOU!!!   Continue to monitor blood sugars closely and check with clinical pharmacist periodically about any questions regarding highs and lows. Stay active physically and watch diet closely Drink plenty water and stay well-hydrated the summer Check feet regularly

## 2017-11-28 NOTE — Progress Notes (Signed)
Subjective:    [name], male    DOB: 06-09-1940, 78 y.o.   MRN: [id number]  HPI Pt here for follow up and management of chronic medical problems which includes diabetes, hypertension and hyperlipidemia. He is taking medication regularly.  The patient is doing well overall and he has been working with our diabetic educator for getting his sugars under better control with diet exercise and his current medication management which is Jardiance Januvia metformin and [place].  He has no specific complaints today and does not need any refills.  He comes to the visit today with his wife.  She helps him with his diabetes management and talks regularly with our clinical pharmacist who is the diabetic educator.  His weight is up 4 pounds since the last visit.  His body mass index is 24.94.  All the cholesterol numbers were excellent with an LDL C being 50.  The CBC had a normal white blood cell count and a hemoglobin that is slightly decreased and this is his normal at 11.9.  Platelet count remains slightly decreased at 126,000.  All liver function tests were normal the vitamin D level was good at 49.3.  The fasting blood sugar was 143 the creatinine was 1.57 which is the same as it was 2 months ago.  All of the electrolytes including potassium were good.  Hemoglobin A1c was 7.7.  This is acceptable but we would like for it to be lower.  Blood sugars are brought in for review and his insulin doses have been variable anywhere between 10 units and up as high as 16 units.  These readings will be scanned into the record.  The majority of the fasting readings in the past month have been good overall with sugars running in the 1 10-1 30 range and is high as 152.  These readings are much better than the ones going back to earlier in the year.  Blood pressure readings were also brought in and these were all excellent for him.  The patient denies any chest pain pressure tightness or shortness of breath.  He  denies any change in bowel habits and denies any heartburn indigestion nausea vomiting diarrhea or blood in the stool.  He is passing his water well.  All other blood sugars were reviewed with him and these will be scanned into the record and it is interesting that the morning blood sugars are much better and the ones around 5:00 in the day or the ones that seem to run the highest.  He is taking maximum doses of most of his oral medicines but cannot tolerate any more metformin than he is currently taking.  He is passing his water well.     Patient Active Problem List   Diagnosis Date Noted  . Chest pain at rest 05/26/2016  . Thrombocytopenia ([place]) 03/18/2015  . Sebaceous cyst 01/23/2015  . Atopic dermatitis 01/23/2015  . Foot lesion 01/16/2015  . Thoracic disc herniation 06/20/2014  . Left varicocele 06/20/2014  . Abdominal aortic atherosclerosis ([place]) 06/20/2014  . Ventral hernia without obstruction or gangrene 02/04/2014  . Personal history of colonic polyps 02/04/2014  . Hyperlipemia 12/30/2012  . Hypertension 12/30/2012  . BPH (benign prostatic hypertrophy) 12/30/2012  . Diabetes type 2, controlled ([place]) 09/28/2012  . RBBB 04/11/2010  . CARDIOVASCULAR FUNCTION STUDY, ABNORMAL 04/11/2010  . Diaphragmatic hernia 09/14/2007  . NEPHROLITHIASIS 09/14/2007   Outpatient Encounter Medications as of 11/28/2017  Medication Sig  . aspirin 81 MG  tablet Take 81 mg by mouth daily.    . Blood Glucose Monitoring Suppl w/Device KIT Test BS BID and PRN dx E11.9. Give test strips and lancets to match machine given  . cholecalciferol (VITAMIN D) 1000 UNITS tablet Take 2,000 Units by mouth daily.   . Cinnamon 500 MG TABS Take 1,000 tablets by mouth 2 (two) times daily.  . Continuous Blood Gluc Sensor (FREESTYLE LIBRE SENSOR SYSTEM) MISC Monitor blood glucose at least 4 times daily changing sensor every 14 days DX E11.65  Z79.4  . doxylamine, Sleep, (UNISOM) 25 MG tablet Take 25 mg by mouth at bedtime  as needed for sleep.   . empagliflozin (JARDIANCE) 25 MG TABS tablet Take 25 mg by mouth daily.  Marland Kitchen esomeprazole (NEXIUM) 40 MG capsule TAKE (1) CAPSULE DAILY  . glucose blood (ONE TOUCH ULTRA TEST) test strip Check BS qid  Dx: E11.9  . insulin degludec (TRESIBA FLEXTOUCH) 100 UNIT/ML SOPN FlexTouch Pen Inject 0.06-0.2 mLs (6-20 Units total) into the skin daily at 10 pm.  . Insulin Pen Needle (PEN NEEDLES) 32G X 5 MM MISC 1 Device by Does not apply route daily.  Marland Kitchen JANUVIA 100 MG tablet TAKE 1 TABLET DAILY  . metFORMIN (GLUCOPHAGE-XR) 500 MG 24 hr tablet TAKE 4 TABLETS DAILY AS DIRECTED  . mupirocin ointment (BACTROBAN) 2 % Apply 1 application topically 2 (two) times daily. (Patient taking differently: Apply 1 application topically 2 (two) times daily as needed (rash). )  . olmesartan-hydrochlorothiazide (BENICAR HCT) 40-12.5 MG tablet Take 1 tablet by mouth daily.  . ONE TOUCH ULTRA TEST test strip CHECK BLOOD SUGAR 4 TIMES A DAY  . Probiotic Product (PROBIOTIC PO) Take 1 tablet by mouth daily.  . simvastatin (ZOCOR) 40 MG tablet TAKE 1 TABLET EVERY EVENING  . triamcinolone ointment (KENALOG) 0.5 % Apply 1 application topically 2 (two) times daily.  . [DISCONTINUED] cephALEXin (KEFLEX) 500 MG capsule Take 1 capsule (500 mg total) by mouth 2 (two) times daily.  . [DISCONTINUED] metFORMIN (GLUCOPHAGE-XR) 500 MG 24 hr tablet Take 4 tablets (2,000 mg total) by mouth daily after supper.   No facility-administered encounter medications on file as of 11/28/2017.       Review of Systems  Constitutional: Negative.   HENT: Negative.   Eyes: Negative.   Respiratory: Negative.   Cardiovascular: Negative.   Gastrointestinal: Negative.   Endocrine: Negative.   Genitourinary: Negative.   Musculoskeletal: Negative.   Skin: Negative.   Allergic/Immunologic: Negative.   Neurological: Negative.   Hematological: Negative.   Psychiatric/Behavioral: Negative.        Objective:   Physical Exam    Constitutional: He is oriented to person, place, and time. He appears well-developed and well-nourished. No distress.  The patient is pleasant calm and relaxed and his wife comes to the visit with him  HENT:  Head: Normocephalic and atraumatic.  Right Ear: External ear normal.  Left Ear: External ear normal.  Nose: Nose normal.  Mouth/Throat: Oropharynx is clear and moist. No oropharyngeal exudate.  Minimal nasal congestion  Eyes: Pupils are equal, round, and reactive to light. Conjunctivae and EOM are normal. Right eye exhibits no discharge. Left eye exhibits no discharge. No scleral icterus.  Sees ophthalmologist regularly  Neck: Normal range of motion. Neck supple. No thyromegaly present.  No bruits thyromegaly or anterior cervical adenopathy  Cardiovascular: Normal rate, regular rhythm, normal heart sounds and intact distal pulses.  No murmur heard. Heart is regular at 60/min without murmur  Pulmonary/Chest: Effort normal  and breath sounds normal. He has no wheezes. He has no rales. He exhibits no tenderness.  Lungs are clear anteriorly and posteriorly and no axillary adenopathy  Abdominal: Soft. Bowel sounds are normal. He exhibits no mass. There is no tenderness. There is no rebound and no guarding.  Minimal epigastric tenderness which could be from the hiatal hernia and reflux or scar tissue.  No liver or spleen enlargement.  No masses.  No bruits.  No inguinal adenopathy.  Musculoskeletal: Normal range of motion. He exhibits no edema.  Lymphadenopathy:    He has no cervical adenopathy.  Neurological: He is alert and oriented to person, place, and time. He has normal reflexes. No cranial nerve deficit.  Skin: Skin is warm and dry. No rash noted. No erythema. No pallor.  Psychiatric: He has a normal mood and affect. His behavior is normal. Judgment and thought content normal.  Nursing note and vitals reviewed.  BP (!) 98/59 (BP Location: Right Arm)   Pulse (!) 54   Temp (!) 97.5  F (36.4 C) (Oral)   Ht 5' 8"  (1.727 m)   Wt 168 lb (76.2 kg)   BMI 25.54 kg/m         Assessment & Plan:  1. Type 2 diabetes mellitus with hyperglycemia, with long-term current use of insulin (HCC) -Hemoglobin A1c was 7.7%.  The patient will stay on his current treatment regimen hoping that was some time this average blood sugar will come down some more.  He will continue with all his therapeutic lifestyle changes and diet.  2. Vitamin D deficiency -Vitamin D level was good and he will continue with current treatment  3. Pure hypercholesterolemia -The LDL-C was great he will continue with current treatment and aggressive therapeutic lifestyle changes  4. Gastroesophageal reflux disease, esophagitis presence not specified -Minimal complaints with reflux today.  5. Thrombocytopenia (HCC) -Platelet count remains decreased but stable and patient is having no bleeding issues  6. Benign prostatic hyperplasia without lower urinary tract symptoms -No complaints today with voiding.  7. Essential hypertension -Blood pressure readings are good and home readings are good and they will be scanned into the record.  Patient Instructions                       Medicare Annual Wellness Visit  [facility] and the medical providers at [facility] strive to bring you the best medical care.  In doing so we not only want to address your current medical conditions and concerns but also to detect new conditions early and prevent illness, disease and health-related problems.    Medicare offers a yearly Wellness Visit which allows our clinical staff to assess your need for preventative services including immunizations, lifestyle education, counseling to decrease risk of preventable diseases and screening for fall risk and other medical concerns.    This visit is provided free of charge (no copay) for all Medicare recipients. The clinical pharmacists at [facility] have begun to conduct these Wellness Visits which will also include a thorough review of all your medications.    As you primary medical provider recommend that you make an appointment for your Annual Wellness Visit if you have not done so already this year.  You may set up this appointment before you leave today or you may call back ([phone]) and schedule an appointment.  Please make sure when you call that you mention that you are scheduling your Annual Wellness  Visit with the clinical pharmacist so that the appointment may be made for the proper length of time.     Continue current medications. Continue good therapeutic lifestyle changes which include good diet and exercise. Fall precautions discussed with patient. If an FOBT was given today- please return it to our front desk. If you are over 68 years old - you may need Prevnar 15 or the adult Pneumonia vaccine.  **Flu shots are available--- please call and schedule a FLU-CLINIC appointment**  After your visit with [place] today you will receive a survey in the mail or online from [employer] regarding your care with [place]. Please take a moment to fill this out. Your feedback is very important to [place] as you can help [place] better understand your patient needs as well as improve your experience and satisfaction. WE CARE ABOUT YOU!!!   Continue to monitor blood sugars closely and check with clinical pharmacist periodically about any questions regarding highs and lows. Stay active physically and watch diet closely Drink plenty water and stay well-hydrated the summer Check feet regularly   Arrie Senate MD

## 2017-12-01 ENCOUNTER — Other Ambulatory Visit: Payer: Self-pay | Admitting: Family Medicine

## 2017-12-02 ENCOUNTER — Other Ambulatory Visit: Payer: Self-pay | Admitting: Pharmacist Clinician (PhC)/ Clinical Pharmacy Specialist

## 2017-12-16 ENCOUNTER — Ambulatory Visit: Payer: Medicare Other | Admitting: Family Medicine

## 2017-12-16 ENCOUNTER — Encounter: Payer: Self-pay | Admitting: Family Medicine

## 2017-12-16 VITALS — BP 138/66 | HR 53 | Temp 97.1°F | Ht 68.0 in | Wt 172.0 lb

## 2017-12-16 DIAGNOSIS — L600 Ingrowing nail: Secondary | ICD-10-CM

## 2017-12-16 MED ORDER — MUPIROCIN 2 % EX OINT: 1.0000 "application " | TOPICAL_OINTMENT | Freq: Two times a day (BID) | CUTANEOUS | 1 refills | Status: DC

## 2017-12-16 NOTE — Progress Notes (Signed)
   HPI  Patient presents today here with ingrown toenail.  Patient states that is been going on for about 1 week.  He has had issues with this off and on for years.  He can usually manipulate the cuticle over the nail and have resolution of symptoms, however this time is been unsuccessful.  Patient states that it has not limited his activities and is only annoying.  He has not tried foot soaks or ointments  PMH: Smoking status noted ROS: Per HPI  Objective: BP 138/66   Pulse (!) 53   Temp (!) 97.1 F (36.2 C) (Oral)   Ht 5\' 8"  (1.727 m)   Wt 172 lb (78 kg)   BMI 26.15 kg/m  Gen: NAD, alert, cooperative with exam HEENT: NCAT CV: RRR, good S1/S2, no murmur Resp: CTABL, no wheezes, non-labored Ext: No edema, warm Neuro: Alert and oriented, No gross deficits L great toenail ingrown on lateral edge, no significant erythema, mild ttp of indurated area at the nail edge  Assessment and plan:  #Toenail left toe Considering diabetic would recommend podiatry for any surgical excision Discussed conservative therapy including mupirocin, foot soaks Return to clinic with any concerns, referral to podiatry made    Orders Placed This Encounter  Procedures  . Ambulatory referral to Podiatry    Referral Priority:   Routine    Referral Type:   Consultation    Referral Reason:   Specialty Services Required    Requested Specialty:   Podiatry    Number of Visits Requested:   1    Meds ordered this encounter  Medications  . mupirocin ointment (BACTROBAN) 2 %    Sig: Apply 1 application topically 2 (two) times daily.    Dispense:  22 g    Refill:  [place], MD [facility] 12/16/2017, 10:20 AM

## 2017-12-29 ENCOUNTER — Other Ambulatory Visit: Payer: Self-pay | Admitting: Family Medicine

## 2018-01-19 ENCOUNTER — Other Ambulatory Visit: Payer: Self-pay | Admitting: Family Medicine

## 2018-01-23 ENCOUNTER — Other Ambulatory Visit: Payer: Self-pay | Admitting: Family Medicine

## 2018-01-28 ENCOUNTER — Other Ambulatory Visit: Payer: Self-pay | Admitting: Family Medicine

## 2018-02-17 ENCOUNTER — Ambulatory Visit (INDEPENDENT_AMBULATORY_CARE_PROVIDER_SITE_OTHER): Payer: Medicare Other | Admitting: *Deleted

## 2018-02-17 ENCOUNTER — Encounter: Payer: Self-pay | Admitting: *Deleted

## 2018-02-17 VITALS — BP 118/64 | HR 61 | Ht 69.0 in | Wt 169.0 lb

## 2018-02-17 DIAGNOSIS — Z0000 Encounter for general adult medical examination without abnormal findings: Secondary | ICD-10-CM

## 2018-02-17 NOTE — Patient Instructions (Addendum)
  Joe [name] , Thank you for taking time to come for your Medicare Wellness Visit. I appreciate your ongoing commitment to your health goals. Please review the following plan we discussed and let me know if I can assist you in the future.   These are the goals we discussed: Goals    . DIET - INCREASE WATER INTAKE    . Exercise 150 min/wk Moderate Activity       This is a list of the screening recommended for you and due dates:  Health Maintenance  Topic Date Due  . Eye exam for diabetics  08/07/2017  . Flu Shot  01/01/2018  . Hemoglobin A1C  05/27/2018  . Complete foot exam   11/29/2018  . Colon Cancer Screening  04/07/2019  . Tetanus Vaccine  03/03/2021  . Pneumonia vaccines  Completed

## 2018-02-17 NOTE — Progress Notes (Addendum)
Subjective:   Joe [name] is a 77 y.o. male who presents for a Medicare Annual Wellness Visit. Hetty Blend lives at home with his wife. He is a retired Pharmacist, hospital and enjoys reading and doing crossword puzzles. He keeps a crossword puzzle book with him. They go to [place] twice a month to [employer] and enjoy doing this. He also goes to the gym twice a week for an hour and works out on the the machines.    Review of Systems    Patient reports that his overall health is unchanged compared to last year.  Cardiac Risk Factors include: advanced age (>42mn, >>71women);diabetes mellitus;dyslipidemia;hypertension;male gender   All other systems negative      Current Medications (verified) Outpatient Encounter Medications as of 02/17/2018  Medication Sig  . aspirin 81 MG tablet Take 81 mg by mouth daily.    . Blood Glucose Monitoring Suppl w/Device KIT Test BS BID and PRN dx E11.9. Give test strips and lancets to match machine given  . cholecalciferol (VITAMIN D) 1000 UNITS tablet Take 2,000 Units by mouth daily.   . Cinnamon 500 MG TABS Take 1,000 tablets by mouth 2 (two) times daily.  . Continuous Blood Gluc Sensor (FREESTYLE LIBRE SENSOR SYSTEM) MISC Monitor blood glucose at least 4 times daily changing sensor every 14 days DX E11.65  Z79.4  . doxylamine, Sleep, (UNISOM) 25 MG tablet Take 25 mg by mouth at bedtime as needed for sleep.   .Marland Kitchenesomeprazole (NEXIUM) 40 MG capsule TAKE (1) CAPSULE DAILY  . glucose blood (ONE TOUCH ULTRA TEST) test strip Check BS qid  Dx: E11.9  . insulin degludec (TRESIBA FLEXTOUCH) 100 UNIT/ML SOPN FlexTouch Pen Inject 0.06-0.2 mLs (6-20 Units total) into the skin daily at 10 pm.  . Insulin Pen Needle (PEN NEEDLES) 32G X 5 MM MISC 1 Device by Does not apply route daily.  .Marland KitchenJANUVIA 100 MG tablet TAKE 1 TABLET DAILY  . JARDIANCE 25 MG TABS tablet Take 1 tablet by mouth daily.  . metFORMIN (GLUCOPHAGE-XR) 500 MG 24 hr tablet TAKE 4 TABLETS DAILY AS DIRECTED    . mupirocin ointment (BACTROBAN) 2 % Apply 1 application topically 2 (two) times daily.  .Marland Kitchenolmesartan-hydrochlorothiazide (BENICAR HCT) 40-12.5 MG tablet Take 1 tablet by mouth daily.  . ONE TOUCH ULTRA TEST test strip CHECK BLOOD SUGAR 4 TIMES A DAY  . Probiotic Product (PROBIOTIC PO) Take 1 tablet by mouth daily.  . simvastatin (ZOCOR) 40 MG tablet TAKE 1 TABLET EVERY EVENING  . triamcinolone ointment (KENALOG) 0.5 % APPLY TO AFFECTED AREAS TWICE A DAY   No facility-administered encounter medications on file as of 02/17/2018.     Allergies (verified) Ace inhibitors and Trazamine [trazodone & diet manage prod]   History: Past Medical History:  Diagnosis Date  . Arthritis   . Cataract   . Chest pain at rest 05/26/2016  . Colon polyps   . Diabetes mellitus without complication ([facility]   . Dyslipidemia   . ESOPHAGITIS, REFLUX 07/09/2004   Qualifier: Diagnosis of  By: PHardin NegusCMA (AAMA), [place]   . Hiatal hernia   . HOH (hard of hearing)    has bilateral Hearing aids  . Hypertension   . Irritable bowel syndrome   . Kidney stone 1970  . Pneumonia   . Tick bites    took 5 rounds of Doxycycline this summer   Past Surgical History:  Procedure Laterality Date  . COLONOSCOPY    .  ESOPHAGEAL DILATION  1996  . ESOPHAGEAL DILATION  2003  . FLEXIBLE SIGMOIDOSCOPY    . HEMICOLECTOMY  08/2005   Right  . KNEE SURGERY Right   . TONSILLECTOMY     Family History  Problem Relation Age of Onset  . Stroke Father 40  . Hyperlipidemia Father   . Hypertension Father   . Heart disease Brother   . Cancer Brother        lung   . Hyperlipidemia Brother   . Hypertension Brother   . Stroke Brother   . Diabetes Brother   . Breast cancer Mother   . Colon cancer Neg Hx   . Colon polyps Neg Hx   . Kidney disease Neg Hx   . Esophageal cancer Neg Hx   . Stomach cancer Neg Hx   . Rectal cancer Neg Hx    Social History   Socioeconomic History  . Marital status: Married    Spouse name:  [name]  . Number of children: 1  . Years of education: Not on file  . Highest education level: Not on file  Occupational History  . Occupation: Retired Tour manager  . Financial resource strain: Not hard at all  . Food insecurity:    Worry: Never true    Inability: Never true  . Transportation needs:    Medical: No    Non-medical: No  Tobacco Use  . Smoking status: Former Smoker    Packs/day: 2.00    Types: Cigarettes    Start date: 06/03/1954    Last attempt to quit: 08/24/1984    Years since quitting: 33.5  . Smokeless tobacco: Never Used  . Tobacco comment: Has not used to tobacco products for the past 20 years  Substance and Sexual Activity  . Alcohol use: Yes    Alcohol/week: 5.0 standard drinks    Types: 5 Standard drinks or equivalent per week    Comment: Occassionally  . Drug use: No  . Sexual activity: Yes    Partners: Female  Lifestyle  . Physical activity:    Days per week: 2 days    Minutes per session: 60 min  . Stress: Not at all  Relationships  . Social connections:    Talks on phone: More than three times a week    Gets together: More than three times a week    Attends religious service: Never    Active member of club or organization: No    Attends meetings of clubs or organizations: Never    Relationship status: Married  Other Topics Concern  . Not on file  Social History Narrative   Lives in [place] with wife, both teachers    Tobacco Use No.  Clinical Intake:  Pre-visit preparation completed: No  Pain : No/denies pain     Nutritional Status: BMI of 19-24  Normal Diabetes: Yes CBG done?: No CBG resulted in Enter/ Edit results?: No Did pt. bring in CBG monitor from home?: No  How often do you need to have someone help you when you read instructions, pamphlets, or other written materials from your doctor or pharmacy?: 1 - Never What is the last grade level you completed in school?: 4 years of college  Interpreter Needed?:  No  Information entered by :: Colon Flattery, RN  Activities of Daily Living In your present state of health, do you have any difficulty performing the following activities: 02/17/2018  Hearing? Y  Comment wears hearing aids and they help well  Vision? N  Comment wears glasses and has yearly exams  Difficulty concentrating or making decisions? N  Walking or climbing stairs? N  Dressing or bathing? N  Doing errands, shopping? N  Preparing Food and eating ? N  Using the Toilet? N  In the past six months, have you accidently leaked urine? N  Do you have problems with loss of bowel control? N  Managing your Medications? N  Managing your Finances? N  Housekeeping or managing your Housekeeping? N  Some recent data might be hidden     Diet   Exercise Current Exercise Habits: Structured exercise class, Type of exercise: strength training/weights;stretching;walking, Time (Minutes): 60, Frequency (Times/Week): 2, Weekly Exercise (Minutes/Week): 120, Intensity: Moderate, Exercise limited by: None identified    Depression Screen PHQ 2/9 Scores 02/17/2018 12/16/2017 11/28/2017 09/29/2017  PHQ - 2 Score 0 0 0 0  PHQ- 9 Score - - - -     Fall Risk Fall Risk  02/17/2018 12/16/2017 11/28/2017 09/29/2017 09/25/2017  Falls in the past year? No No No No No  Number falls in past yr: - - - - -  Injury with Fall? - - - - -    Safety Is the patient's home free of loose throw rugs in walkways, pet beds, electrical cords, etc?   yes      Grab bars in the bathroom? yes      Walkin shower? yes      Shower Seat? no      Handrails on the stairs?   yes      Adequate lighting?   yes  Patient Care Team: Chipper Herb, MD as PCP - General (Family Medicine) Irine Seal, MD (Urology) Gatha Mayer, MD (Gastroenterology) Minus Breeding, MD as Consulting Physician (Cardiology) Alanda Slim Neena Rhymes, MD as Consulting Physician (Ophthalmology)  Hospitalizations, surgeries, and ER visits in previous 12  months No hospitalizations, ER visits, or surgeries this past year.   Objective:    Today's Vitals   02/17/18 1118  BP: 118/64  Pulse: 61  Weight: 169 lb (76.7 kg)  Height: 5' 9"  (1.753 m)   Body mass index is 24.96 kg/m.  Advanced Directives 02/17/2018 11/07/2016 05/26/2016 11/01/2015 04/06/2014  Does Patient Have a Medical Advance Directive? No No No Yes No  Type of Advance Directive - - Public librarian;Living will -  Copy of [place] in Chart? - - - No - copy requested -  Would patient like information on creating a medical advance directive? No - Patient declined - - - -    Hearing/Vision  No hearing or vision deficits noted during visit.   Cognitive Function: MMSE - Mini Mental State Exam 02/17/2018 02/17/2018 11/07/2016  Orientation to time 5 5 5   Orientation to Place 5 5 5   Registration 3 3 3   Attention/ Calculation 5 5 5   Recall 3 3 3   Language- name 2 objects 2 2 2   Language- repeat 1 1 1   Language- follow 3 step command 3 - 3  Language- read & follow direction 1 - 1  Write a sentence 1 - 1  Copy design 1 - 1  Total score 30 - 30     Normal Cognitive Function Screening: Yes    Immunizations and Health Maintenance Immunization History  Administered Date(s) Administered  . Influenza, High Dose Seasonal PF 03/28/2016, 03/07/2017  . Influenza,inj,Quad PF,6+ Mos 03/23/2013, 03/26/2014, 03/17/2015  . Pneumococcal Conjugate-13 06/08/2013  . Pneumococcal Polysaccharide-23 04/03/2008   Health  Maintenance Due  Topic Date Due  . OPHTHALMOLOGY EXAM  08/07/2017  . INFLUENZA VACCINE  01/01/2018   Health Maintenance  Topic Date Due  . OPHTHALMOLOGY EXAM  08/07/2017  . INFLUENZA VACCINE  01/01/2018  . HEMOGLOBIN A1C  05/27/2018  . FOOT EXAM  11/29/2018  . COLONOSCOPY  04/07/2019  . TETANUS/TDAP  03/03/2021  . PNA vac Low Risk Adult  Completed        Assessment:   This is a routine wellness examination for Rahn.      Plan:     Goals    . DIET - INCREASE WATER INTAKE    . Exercise 150 min/wk Moderate Activity        Health Maintenance Recommendations: Flu shot  Additional Screening Recommendations: Lung: Low Dose CT Chest recommended if Age 87-80 years, 30 pack-year currently smoking OR have quit w/in 15years. Patient does not qualify. Hepatitis C Screening recommended: no  Keep f/u with Chipper Herb, MD and any other specialty appointments you may have Continue current medications Move carefully to avoid falls. Use assistive devices like a cane or walker if needed. Aim for at least 150 minutes of moderate activity a week. This can be chair exercises if necessary. Reading or puzzles are a good way to exercise your brain Stay connected with friends and family. Social connections are beneficial to your emotional and mental health.   Will request eye exam report from Dr Marin Comment for 2019 exam.  I have personally reviewed and noted the following in the patient's chart:   . Medical and social history . Use of alcohol, tobacco or illicit drugs  . Current medications and supplements . Functional ability and status . Nutritional status . Physical activity . Advanced directives . List of other physicians . Hospitalizations, surgeries, and ER visits in previous 12 months . Vitals . Screenings to include cognitive, depression, and falls . Referrals and appointments  In addition, I have reviewed and discussed with patient certain preventive protocols, quality metrics, and best practice recommendations. A written personalized care plan for preventive services as well as general preventive health recommendations were provided to patient.     Chong Sicilian, RN   02/17/2018   I have reviewed and agree with the above AWV documentation.   Mary-Margaret Hassell Done, FNP

## 2018-02-26 ENCOUNTER — Other Ambulatory Visit: Payer: Self-pay | Admitting: Family Medicine

## 2018-03-28 ENCOUNTER — Other Ambulatory Visit: Payer: Self-pay | Admitting: Family Medicine

## 2018-03-30 NOTE — Telephone Encounter (Signed)
Last seen 12/16/17 [facility]

## 2018-03-31 ENCOUNTER — Telehealth: Payer: Self-pay | Admitting: Family Medicine

## 2018-03-31 NOTE — Telephone Encounter (Signed)
Pt coming in for labs for appt with [facility]

## 2018-04-01 ENCOUNTER — Other Ambulatory Visit (INDEPENDENT_AMBULATORY_CARE_PROVIDER_SITE_OTHER): Payer: Medicare Other

## 2018-04-01 DIAGNOSIS — Z23 Encounter for immunization: Secondary | ICD-10-CM | POA: Diagnosis not present

## 2018-04-01 DIAGNOSIS — K219 Gastro-esophageal reflux disease without esophagitis: Secondary | ICD-10-CM

## 2018-04-01 DIAGNOSIS — N400 Enlarged prostate without lower urinary tract symptoms: Secondary | ICD-10-CM

## 2018-04-01 DIAGNOSIS — E559 Vitamin D deficiency, unspecified: Secondary | ICD-10-CM

## 2018-04-01 DIAGNOSIS — E1165 Type 2 diabetes mellitus with hyperglycemia: Secondary | ICD-10-CM

## 2018-04-01 DIAGNOSIS — Z794 Long term (current) use of insulin: Principal | ICD-10-CM

## 2018-04-01 DIAGNOSIS — E7800 Pure hypercholesterolemia, unspecified: Secondary | ICD-10-CM

## 2018-04-01 DIAGNOSIS — I10 Essential (primary) hypertension: Secondary | ICD-10-CM

## 2018-04-01 DIAGNOSIS — D696 Thrombocytopenia, unspecified: Secondary | ICD-10-CM

## 2018-04-01 LAB — BAYER DCA HB A1C WAIVED: HB A1C (BAYER DCA - WAIVED): 7.2 % — ABNORMAL HIGH (ref <7.0)

## 2018-04-02 LAB — CBC WITH DIFFERENTIAL/PLATELET
BASOS: 1 %
Basophils Absolute: 0.1 10*3/uL (ref 0.0–0.2)
EOS (ABSOLUTE): 0.6 10*3/uL — ABNORMAL HIGH (ref 0.0–0.4)
EOS: 9 %
HEMATOCRIT: 37.4 % — AB (ref 37.5–51.0)
Hemoglobin: 12.9 g/dL — ABNORMAL LOW (ref 13.0–17.7)
IMMATURE GRANS (ABS): 0 10*3/uL (ref 0.0–0.1)
Immature Granulocytes: 0 %
LYMPHS: 16 %
Lymphocytes Absolute: 1.1 10*3/uL (ref 0.7–3.1)
MCH: 31.2 pg (ref 26.6–33.0)
MCHC: 34.5 g/dL (ref 31.5–35.7)
MCV: 91 fL (ref 79–97)
MONOCYTES: 10 %
Monocytes Absolute: 0.7 10*3/uL (ref 0.1–0.9)
NEUTROS ABS: 4.3 10*3/uL (ref 1.4–7.0)
Neutrophils: 64 %
Platelets: 152 10*3/uL (ref 150–450)
RBC: 4.13 x10E6/uL — ABNORMAL LOW (ref 4.14–5.80)
RDW: 12.8 % (ref 12.3–15.4)
WBC: 6.7 10*3/uL (ref 3.4–10.8)

## 2018-04-02 LAB — HEPATIC FUNCTION PANEL
ALBUMIN: 4.4 g/dL (ref 3.5–4.8)
ALT: 16 IU/L (ref 0–44)
AST: 19 IU/L (ref 0–40)
Alkaline Phosphatase: 57 IU/L (ref 39–117)
Bilirubin Total: 0.6 mg/dL (ref 0.0–1.2)
Bilirubin, Direct: 0.18 mg/dL (ref 0.00–0.40)
Total Protein: 6.7 g/dL (ref 6.0–8.5)

## 2018-04-02 LAB — PSA, TOTAL AND FREE
PROSTATE SPECIFIC AG, SERUM: 2.4 ng/mL (ref 0.0–4.0)
PSA, Free Pct: 29.2 %
PSA, Free: 0.7 ng/mL

## 2018-04-02 LAB — LIPID PANEL
CHOLESTEROL TOTAL: 130 mg/dL (ref 100–199)
Chol/HDL Ratio: 2.5 ratio (ref 0.0–5.0)
HDL: 51 mg/dL (ref >39)
LDL Calculated: 55 mg/dL (ref 0–99)
Triglycerides: 118 mg/dL (ref 0–149)
VLDL CHOLESTEROL CAL: 24 mg/dL (ref 5–40)

## 2018-04-02 LAB — BMP8+EGFR
BUN / CREAT RATIO: 15 (ref 10–24)
BUN: 23 mg/dL (ref 8–27)
CALCIUM: 9.2 mg/dL (ref 8.6–10.2)
CO2: 22 mmol/L (ref 20–29)
Chloride: 102 mmol/L (ref 96–106)
Creatinine, Ser: 1.54 mg/dL — ABNORMAL HIGH (ref 0.76–1.27)
GFR, EST AFRICAN AMERICAN: 50 mL/min/{1.73_m2} — AB (ref >59)
GFR, EST NON AFRICAN AMERICAN: 43 mL/min/{1.73_m2} — AB (ref >59)
Glucose: 108 mg/dL — ABNORMAL HIGH (ref 65–99)
Potassium: 4.4 mmol/L (ref 3.5–5.2)
Sodium: 140 mmol/L (ref 134–144)

## 2018-04-02 LAB — VITAMIN D 25 HYDROXY (VIT D DEFICIENCY, FRACTURES): Vit D, 25-Hydroxy: 46.8 ng/mL (ref 30.0–100.0)

## 2018-04-03 ENCOUNTER — Encounter: Payer: Self-pay | Admitting: *Deleted

## 2018-04-03 DIAGNOSIS — E1140 Type 2 diabetes mellitus with diabetic neuropathy, unspecified: Secondary | ICD-10-CM | POA: Insufficient documentation

## 2018-04-06 ENCOUNTER — Ambulatory Visit: Payer: Medicare Other | Admitting: Family Medicine

## 2018-04-15 ENCOUNTER — Encounter: Payer: Self-pay | Admitting: Family Medicine

## 2018-04-15 ENCOUNTER — Ambulatory Visit: Payer: Medicare Other | Admitting: Family Medicine

## 2018-04-15 VITALS — BP 127/66 | HR 60 | Temp 97.2°F | Ht 69.0 in | Wt 169.0 lb

## 2018-04-15 DIAGNOSIS — E7800 Pure hypercholesterolemia, unspecified: Secondary | ICD-10-CM

## 2018-04-15 DIAGNOSIS — N400 Enlarged prostate without lower urinary tract symptoms: Secondary | ICD-10-CM

## 2018-04-15 DIAGNOSIS — K219 Gastro-esophageal reflux disease without esophagitis: Secondary | ICD-10-CM | POA: Diagnosis not present

## 2018-04-15 DIAGNOSIS — N5201 Erectile dysfunction due to arterial insufficiency: Secondary | ICD-10-CM

## 2018-04-15 DIAGNOSIS — E559 Vitamin D deficiency, unspecified: Secondary | ICD-10-CM | POA: Diagnosis not present

## 2018-04-15 DIAGNOSIS — E1165 Type 2 diabetes mellitus with hyperglycemia: Secondary | ICD-10-CM

## 2018-04-15 DIAGNOSIS — E1140 Type 2 diabetes mellitus with diabetic neuropathy, unspecified: Secondary | ICD-10-CM

## 2018-04-15 DIAGNOSIS — D696 Thrombocytopenia, unspecified: Secondary | ICD-10-CM

## 2018-04-15 DIAGNOSIS — Z794 Long term (current) use of insulin: Secondary | ICD-10-CM

## 2018-04-15 DIAGNOSIS — I10 Essential (primary) hypertension: Secondary | ICD-10-CM

## 2018-04-15 LAB — URINALYSIS, COMPLETE
Bilirubin, UA: NEGATIVE
KETONES UA: NEGATIVE
LEUKOCYTES UA: NEGATIVE
NITRITE UA: NEGATIVE
Protein, UA: NEGATIVE
RBC, UA: NEGATIVE
SPEC GRAV UA: 1.01 (ref 1.005–1.030)
Urobilinogen, Ur: 0.2 mg/dL (ref 0.2–1.0)
pH, UA: 5 (ref 5.0–7.5)

## 2018-04-15 LAB — MICROSCOPIC EXAMINATION
BACTERIA UA: NONE SEEN
EPITHELIAL CELLS (NON RENAL): NONE SEEN /HPF (ref 0–10)
RBC MICROSCOPIC, UA: NONE SEEN /HPF (ref 0–2)
Renal Epithel, UA: NONE SEEN /hpf
WBC, UA: NONE SEEN /hpf (ref 0–5)

## 2018-04-15 NOTE — Progress Notes (Signed)
Subjective:    [name], male    DOB: 1940/07/14, 77 y.o.   MRN: [id number]  HPI  Pt here for follow up and management of chronic medical problems which includes diabetes and hypertension. He is taking medication regularly.  The patient comes in today for his regular physical exam.  He brings in blood sugars for review.  The most recent blood sugars are running in the low 100 range in the morning fasting 2 hours after lunch anywhere between 150 and low 200s on the average and at bedtime of around 140 up to the 200 range.  These will be scanned into the record.  Blood pressures are generally excellent running from 110 130 over generally the 50s.  These also will be scanned into the record.  The November blood sugars have been running around 80-160 range but this is fasting in the morning.  The patient has had lab work done and this will be reviewed with him during the visit today.  He is 77 years old.  His PSA is running about 2.4 and previously was 2.1 a year ago.  The hemoglobin A1c is 7.2% and improved from 4 months ago when it was 7.7%.  They have been following closely with the clinical pharmacist on his blood sugars.  The blood sugar itself was 108 fasting.  The creatinine remained stable at 1.54 and all of the electrolytes including potassium are good.  Hemoglobin for this patient has always run slightly lower than the normal range at this time is 12.9.  White blood cell count is not elevated and the platelet count is adequate this time and in the past has been low.  All cholesterol numbers with traditional lipid testing are excellent and at goal with the bad cholesterol being 55 the good cholesterol being good at 51 triglycerides being good at 118.  Vitamin D level was good at 46.8.  All liver function tests were normal.  Patient's family history is positive for breast cancer in his mother and hyperlipidemia and hypertension and stroke in his father.  He also had a brother that had  diabetes heart disease and hypertension.  Is currently taking Januvia Jardiance and metformin.  He is also on [place].  He takes simvastatin for his cholesterol and Benicar HCT for his blood pressure.  Patient today denies any chest pain pressure tightness or shortness of breath.  He denies any trouble with his stomach including nausea vomiting diarrhea blood in the stool or black tarry bowel movements.  He is passing his water with out problems but does have BPH.  He also complains today of increasing and diminishing sensitivity in his feet and legs.  He also complains of erectile dysfunction.  His blood sugars and blood pressures were reviewed and I will be scanned into the record.     Patient Active Problem List   Diagnosis Date Noted  . Type 2 diabetes mellitus with diabetic neuropathy, unspecified ([place]) 04/03/2018  . Chest pain at rest 05/26/2016  . Thrombocytopenia ([place]) 03/18/2015  . Sebaceous cyst 01/23/2015  . Atopic dermatitis 01/23/2015  . Foot lesion 01/16/2015  . Thoracic disc herniation 06/20/2014  . Left varicocele 06/20/2014  . Abdominal aortic atherosclerosis ([place]) 06/20/2014  . Ventral hernia without obstruction or gangrene 02/04/2014  . Personal history of colonic polyps 02/04/2014  . Hyperlipemia 12/30/2012  . Hypertension 12/30/2012  . BPH (benign prostatic hypertrophy) 12/30/2012  . Diabetes type 2, controlled ([place]) 09/28/2012  . RBBB 04/11/2010  .  CARDIOVASCULAR FUNCTION STUDY, ABNORMAL 04/11/2010  . Diaphragmatic hernia 09/14/2007  . NEPHROLITHIASIS 09/14/2007   Outpatient Encounter Medications as of 04/15/2018  Medication Sig  . aspirin 81 MG tablet Take 81 mg by mouth daily.    . Blood Glucose Monitoring Suppl w/Device KIT Test BS BID and PRN dx E11.9. Give test strips and lancets to match machine given  . cholecalciferol (VITAMIN D) 1000 UNITS tablet Take 2,000 Units by mouth daily.   . Cinnamon 500 MG TABS Take 1,000 tablets by mouth 2 (two) times daily.    . Continuous Blood Gluc Sensor (FREESTYLE LIBRE SENSOR SYSTEM) MISC Monitor blood glucose at least 4 times daily changing sensor every 14 days DX E11.65  Z79.4  . doxylamine, Sleep, (UNISOM) 25 MG tablet Take 25 mg by mouth at bedtime as needed for sleep.   Marland Kitchen esomeprazole (NEXIUM) 40 MG capsule TAKE (1) CAPSULE DAILY  . glucose blood (ONE TOUCH ULTRA TEST) test strip Check BS qid  Dx: E11.9  . insulin degludec (TRESIBA FLEXTOUCH) 100 UNIT/ML SOPN FlexTouch Pen Inject 0.06-0.2 mLs (6-20 Units total) into the skin daily at 10 pm.  . Insulin Pen Needle (PEN NEEDLES) 32G X 5 MM MISC 1 Device by Does not apply route daily.  Marland Kitchen JANUVIA 100 MG tablet TAKE 1 TABLET DAILY  . JARDIANCE 25 MG TABS tablet TAKE 1 TABLET DAILY  . metFORMIN (GLUCOPHAGE-XR) 500 MG 24 hr tablet TAKE 4 TABLETS DAILY AS DIRECTED  . mupirocin ointment (BACTROBAN) 2 % Apply 1 application topically 2 (two) times daily.  Marland Kitchen olmesartan-hydrochlorothiazide (BENICAR HCT) 40-12.5 MG tablet Take 1 tablet by mouth daily.  . ONE TOUCH ULTRA TEST test strip CHECK BLOOD SUGAR 4 TIMES A DAY  . Probiotic Product (PROBIOTIC PO) Take 1 tablet by mouth daily.  . simvastatin (ZOCOR) 40 MG tablet TAKE 1 TABLET EVERY EVENING  . triamcinolone ointment (KENALOG) 0.5 % APPLY TO AFFECTED AREAS TWICE A DAY   No facility-administered encounter medications on file as of 04/15/2018.      Review of Systems  Constitutional: Negative.   HENT: Negative.   Eyes: Negative.   Respiratory: Negative.   Cardiovascular: Negative.   Gastrointestinal: Negative.   Endocrine: Negative.   Genitourinary: Negative.   Musculoskeletal: Negative.   Skin: Positive for rash (in several areas from [place]).  Allergic/Immunologic: Negative.   Neurological: Positive for numbness (in bilateral feet and lower legs).  Hematological: Negative.   Psychiatric/Behavioral: Negative.        Objective:   Physical Exam  Constitutional: He is oriented to person, place, and time.  He appears well-developed and well-nourished. No distress.  Patient is pleasant and alert  HENT:  Head: Normocephalic and atraumatic.  Right Ear: External ear normal.  Left Ear: External ear normal.  Nose: Nose normal.  Mouth/Throat: Oropharynx is clear and moist. No oropharyngeal exudate.  Bilateral hearing aids  Eyes: Pupils are equal, round, and reactive to light. Conjunctivae and EOM are normal. Right eye exhibits no discharge. Left eye exhibits no discharge. No scleral icterus.  Up-to-date on eye exams  Neck: Normal range of motion. Neck supple. No thyromegaly present.  No bruits thyromegaly or anterior cervical adenopathy  Cardiovascular: Normal rate, regular rhythm, normal heart sounds and intact distal pulses.  No murmur heard. The heart is regular at 60/min without murmurs or irregularity  Pulmonary/Chest: Effort normal and breath sounds normal. He has no wheezes. He has no rales. He exhibits no tenderness.  Clear anteriorly and posteriorly.  No axillary adenopathy.  Abdominal: Soft. Bowel sounds are normal. He exhibits no mass. There is no tenderness. There is no rebound and no guarding.  No liver or spleen enlargement.  No epigastric tenderness.  No suprapubic tenderness.  No masses or organ enlargement.  Genitourinary: Rectum normal and penis normal.  Genitourinary Comments: Prostate is enlarged and smooth.  No rectal masses.  External genitalia were within normal limits with no inguinal hernias being palpated.  Musculoskeletal: Normal range of motion. He exhibits deformity. He exhibits no edema or tenderness.  Minimal kyphosis  Lymphadenopathy:    He has no cervical adenopathy.  Neurological: He is alert and oriented to person, place, and time. He has normal reflexes. No cranial nerve deficit.  Skin: Skin is warm and dry. Rash noted.  Clearing rash where freestyle [name] was previously located.  Psychiatric: He has a normal mood and affect. His behavior is normal. Judgment  and thought content normal.  The patient's mood affect and behavior were all normal.  Nursing note and vitals reviewed.  BP 127/66 (BP Location: Left Arm)   Pulse 60   Temp (!) 97.2 F (36.2 C) (Oral)   Ht _0  (1.753 m)   Wt 169 lb (76.7 kg)   BMI 24.96 kg/m         Assessment & Plan:  1. Vitamin D deficiency -Continue with vitamin D replacement  2. Type 2 diabetes mellitus with hyperglycemia, with long-term current use of insulin ([place]) -Continue with current diabetes treatment and monitoring blood sugars regularly and checking feet regularly  3. Pure hypercholesterolemia -All cholesterol numbers were excellent he will continue with his simvastatin  4. Gastroesophageal reflux disease, esophagitis presence not specified -No complaints today with reflux.  Patient will continue with Nexium.  5. Benign prostatic hyperplasia without lower urinary tract symptoms -Prostate remains enlarged with a stable PSA - Urinalysis, Complete  6. Thrombocytopenia (HCC) -Platelet count this time was within normal limits.  7. Essential hypertension -Blood pressure is good he will continue with current treatment  8. Type 2 diabetes mellitus with diabetic neuropathy, without long-term current use of insulin (HCC) -Patient complains of diminished sensation on the toes of both feet.  9. Erectile dysfunction due to arterial insufficiency -Patient has been unable to get a firm erection for a good while.  Patient Instructions                       Medicare Annual Wellness Visit  [facility] and the medical providers at [facility] strive to bring you the best medical care.  In doing so we not only want to address your current medical conditions and concerns but also to detect new conditions early and prevent illness, disease and health-related problems.    Medicare offers a yearly Wellness Visit which allows our clinical staff to assess your need for preventative  services including immunizations, lifestyle education, counseling to decrease risk of preventable diseases and screening for fall risk and other medical concerns.    This visit is provided free of charge (no copay) for all Medicare recipients. The clinical pharmacists at [facility] have begun to conduct these Wellness Visits which will also include a thorough review of all your medications.    As you primary medical provider recommend that you make an appointment for your Annual Wellness Visit if you have not done so already this year.  You may set up this appointment before you leave today or you may call back [phone])  and schedule an appointment.  Please make sure when you call that you mention that you are scheduling your Annual Wellness Visit with the clinical pharmacist so that the appointment may be made for the proper length of time.   '  Continue current medications. Continue good therapeutic lifestyle changes which include good diet and exercise. Fall precautions discussed with patient. If an FOBT was given today- please return it to our front desk. If you are over 69 years old - you may need Prevnar 38 or the adult Pneumonia vaccine.  **Flu shots are available--- please call and schedule a FLU-CLINIC appointment**  After your visit with [place] today you will receive a survey in the mail or online from [employer] regarding your care with [place]. Please take a moment to fill this out. Your feedback is very important to [place] as you can help [place] better understand your patient needs as well as improve your experience and satisfaction. WE CARE ABOUT YOU!!!   Watch diet closely Check feet regularly Keep blood sugars under the best control possible Keep blood pressures under the best control possible Watch sodium intake Follow-up with clinical pharmacist as planned Be careful with the freestyle libre and its reaction that it may be causing.  Arrie Senate MD

## 2018-04-15 NOTE — Patient Instructions (Addendum)
Medicare Annual Wellness Visit  [facility] and the medical providers at [facility] strive to bring you the best medical care.  In doing so we not only want to address your current medical conditions and concerns but also to detect new conditions early and prevent illness, disease and health-related problems.    Medicare offers a yearly Wellness Visit which allows our clinical staff to assess your need for preventative services including immunizations, lifestyle education, counseling to decrease risk of preventable diseases and screening for fall risk and other medical concerns.    This visit is provided free of charge (no copay) for all Medicare recipients. The clinical pharmacists at [facility] have begun to conduct these Wellness Visits which will also include a thorough review of all your medications.    As you primary medical provider recommend that you make an appointment for your Annual Wellness Visit if you have not done so already this year.  You may set up this appointment before you leave today or you may call back ([phone]) and schedule an appointment.  Please make sure when you call that you mention that you are scheduling your Annual Wellness Visit with the clinical pharmacist so that the appointment may be made for the proper length of time.   '  Continue current medications. Continue good therapeutic lifestyle changes which include good diet and exercise. Fall precautions discussed with patient. If an FOBT was given today- please return it to our front desk. If you are over 45 years old - you may need Prevnar 68 or the adult Pneumonia vaccine.  **Flu shots are available--- please call and schedule a FLU-CLINIC appointment**  After your visit with [place] today you will receive a survey in the mail or online from [employer] regarding your care with [place]. Please take a moment to fill this out. Your feedback is very  important to [place] as you can help [place] better understand your patient needs as well as improve your experience and satisfaction. WE CARE ABOUT YOU!!!   Watch diet closely Check feet regularly Keep blood sugars under the best control possible Keep blood pressures under the best control possible Watch sodium intake Follow-up with clinical pharmacist as planned Be careful with the freestyle libre and its reaction that it may be causing.

## 2018-04-28 ENCOUNTER — Other Ambulatory Visit: Payer: Self-pay | Admitting: Nurse Practitioner

## 2018-05-26 ENCOUNTER — Other Ambulatory Visit: Payer: Self-pay | Admitting: Family Medicine

## 2018-06-24 ENCOUNTER — Other Ambulatory Visit: Payer: Self-pay | Admitting: Family Medicine

## 2018-07-21 ENCOUNTER — Ambulatory Visit: Payer: Medicare Other | Admitting: Nurse Practitioner

## 2018-07-21 ENCOUNTER — Encounter: Payer: Self-pay | Admitting: Nurse Practitioner

## 2018-07-21 VITALS — BP 120/63 | HR 59 | Temp 97.8°F | Ht 69.0 in | Wt 171.0 lb

## 2018-07-21 DIAGNOSIS — J069 Acute upper respiratory infection, unspecified: Secondary | ICD-10-CM | POA: Diagnosis not present

## 2018-07-21 MED ORDER — HYDROCODONE-HOMATROPINE 5-1.5 MG/5ML PO SYRP: 5.0000 mL | ORAL_SOLUTION | Freq: Four times a day (QID) | ORAL | 0 refills | Status: DC | PRN

## 2018-07-21 MED ORDER — PREDNISONE 20 MG PO TABS: ORAL_TABLET | ORAL | 0 refills | Status: DC

## 2018-07-21 NOTE — Progress Notes (Signed)
Subjective:    [name], male    DOB: 10/15/1940, 78 y.o.   MRN: [id number]   Chief Complaint: Sinus Problem and Nasal Congestion   HPI Patient come sin  Today c/o of sinus congestion and cough. Started 3 days ago. No fever and no SOB.   Review of Systems  Constitutional: Negative for appetite change, chills and fever.  HENT: Positive for congestion and rhinorrhea. Negative for ear pain, sore throat and trouble swallowing.   Respiratory: Positive for cough (dry). Negative for shortness of breath.   Gastrointestinal: Negative.   Neurological: Positive for headaches.  Psychiatric/Behavioral: Negative.   All other systems reviewed and are negative.      Objective:   Physical Exam Vitals signs and nursing note reviewed.  Constitutional:      General: He is not in acute distress.    Appearance: He is normal weight.  HENT:     Right Ear: Hearing, tympanic membrane, ear canal and external ear normal.     Left Ear: Hearing, tympanic membrane, ear canal and external ear normal.     Nose: Mucosal edema, congestion and rhinorrhea present.     Right Sinus: No maxillary sinus tenderness or frontal sinus tenderness.     Left Sinus: No maxillary sinus tenderness or frontal sinus tenderness.  Neck:     Musculoskeletal: Normal range of motion and neck supple.  Cardiovascular:     Rate and Rhythm: Regular rhythm.     Heart sounds: Normal heart sounds.  Pulmonary:     Effort: Pulmonary effort is normal. No respiratory distress.     Breath sounds: Normal breath sounds. No stridor. No wheezing, rhonchi or rales.  Chest:     Chest wall: No tenderness.  Lymphadenopathy:     Cervical: No cervical adenopathy.  Skin:    General: Skin is warm and dry.  Neurological:     General: No focal deficit present.     Mental Status: He is alert and oriented to person, place, and time.  Psychiatric:        Mood and Affect: Mood normal.        Behavior: Behavior normal.    BP  120/63   Pulse (!) 59   Temp 97.8 F (36.6 C) (Oral)   Ht 5\' 9"  (1.753 m)   Wt 171 lb (77.6 kg)   BMI 25.25 kg/m         Assessment & Plan:  Joe [name] in today with chief complaint of Sinus Problem and Nasal Congestion   1. Upper respiratory infection with cough and congestion 1. Take meds as prescribed 2. Use a cool mist humidifier especially during the winter months and when heat has been humid. 3. Use saline nose sprays frequently 4. Saline irrigations of the nose can be very helpful if done frequently.  * 4X daily for 1 week*  * Use of a nettie pot can be helpful with this. Follow directions with this* 5. Drink plenty of fluids 6. Keep thermostat turn down low 7.For any cough or congestion  Use plain Mucinex- regular strength or max strength is fine   * Children- consult with Pharmacist for dosing 8. For fever or aces or pains- take tylenol or ibuprofen appropriate for age and weight.  * for fevers greater than 101 orally you may alternate ibuprofen and tylenol every  3 hours.   Watch blood sugars because they will increase with prednisone Increase tresiba to 20u daily while on prednisone  -  predniSONE (DELTASONE) 20 MG tablet; 2 po at sametime daily for 5 days  Dispense: 10 tablet; Refill: 0 - HYDROcodone-homatropine (HYCODAN) 5-1.5 MG/5ML syrup; Take 5 mLs by mouth every 6 (six) hours as needed for cough.  Dispense: 120 mL; Refill: 0  Mary-Margaret Hassell Done, FNP

## 2018-07-21 NOTE — Patient Instructions (Signed)

## 2018-08-03 LAB — HM DIABETES EYE EXAM

## 2018-08-08 ENCOUNTER — Other Ambulatory Visit: Payer: Self-pay | Admitting: Family Medicine

## 2018-08-08 MED ORDER — GLUCOSE BLOOD VI STRP: ORAL_STRIP | 2 refills | Status: DC

## 2018-08-08 NOTE — Telephone Encounter (Signed)
Test strips sent to pharmacy and pt's wife aware.

## 2018-08-15 ENCOUNTER — Other Ambulatory Visit: Payer: Medicare Other

## 2018-08-15 ENCOUNTER — Other Ambulatory Visit: Payer: Self-pay

## 2018-08-20 ENCOUNTER — Ambulatory Visit: Payer: Medicare Other | Admitting: Family Medicine

## 2018-08-21 ENCOUNTER — Other Ambulatory Visit: Payer: Self-pay | Admitting: *Deleted

## 2018-08-21 ENCOUNTER — Other Ambulatory Visit: Payer: Self-pay | Admitting: Nurse Practitioner

## 2018-08-21 MED ORDER — EMPAGLIFLOZIN 25 MG PO TABS: 25.0000 mg | ORAL_TABLET | Freq: Every day | ORAL | 1 refills | Status: DC

## 2018-08-21 MED ORDER — SITAGLIPTIN PHOSPHATE 100 MG PO TABS: 100.0000 mg | ORAL_TABLET | Freq: Every day | ORAL | 2 refills | Status: DC

## 2018-08-21 MED ORDER — SIMVASTATIN 40 MG PO TABS: 40.0000 mg | ORAL_TABLET | Freq: Every evening | ORAL | 1 refills | Status: DC

## 2018-08-21 NOTE — Progress Notes (Signed)
Pt called in requesting refills on meds RXs sent to [facility] per pt request [facility] per Dr Laurance Flatten

## 2018-08-24 ENCOUNTER — Telehealth: Payer: Self-pay

## 2018-08-24 NOTE — Telephone Encounter (Signed)
LM for pt to call back and confirm current dose

## 2018-08-24 NOTE — Telephone Encounter (Signed)
[place] received a refill for [facility] and has questions about the directions.  Is the patient taking one daily OR one-half tablet every other day?

## 2018-08-24 NOTE — Telephone Encounter (Signed)
Spoke with pt and this was fixed a few days ago.

## 2018-08-24 NOTE — Telephone Encounter (Signed)
Please call patient's wife and confirm with patient and/or his wife how he is currently taking the medicine and call those specific directions into the pharmacy with refills.

## 2018-10-01 ENCOUNTER — Other Ambulatory Visit: Payer: Self-pay | Admitting: *Deleted

## 2018-10-01 DIAGNOSIS — Z794 Long term (current) use of insulin: Principal | ICD-10-CM

## 2018-10-01 DIAGNOSIS — E7800 Pure hypercholesterolemia, unspecified: Secondary | ICD-10-CM

## 2018-10-01 DIAGNOSIS — E1165 Type 2 diabetes mellitus with hyperglycemia: Secondary | ICD-10-CM

## 2018-10-01 DIAGNOSIS — K219 Gastro-esophageal reflux disease without esophagitis: Secondary | ICD-10-CM

## 2018-10-01 DIAGNOSIS — E559 Vitamin D deficiency, unspecified: Secondary | ICD-10-CM

## 2018-10-02 ENCOUNTER — Other Ambulatory Visit: Payer: Self-pay

## 2018-10-02 ENCOUNTER — Other Ambulatory Visit: Payer: Medicare Other

## 2018-10-02 DIAGNOSIS — E7800 Pure hypercholesterolemia, unspecified: Secondary | ICD-10-CM

## 2018-10-02 DIAGNOSIS — K219 Gastro-esophageal reflux disease without esophagitis: Secondary | ICD-10-CM

## 2018-10-02 DIAGNOSIS — Z794 Long term (current) use of insulin: Principal | ICD-10-CM

## 2018-10-02 DIAGNOSIS — E1165 Type 2 diabetes mellitus with hyperglycemia: Secondary | ICD-10-CM

## 2018-10-02 DIAGNOSIS — E559 Vitamin D deficiency, unspecified: Secondary | ICD-10-CM

## 2018-10-02 LAB — BAYER DCA HB A1C WAIVED: HB A1C (BAYER DCA - WAIVED): 7.5 % — ABNORMAL HIGH (ref <7.0)

## 2018-10-03 LAB — BMP8+EGFR
BUN/Creatinine Ratio: 15 (ref 10–24)
BUN: 20 mg/dL (ref 8–27)
CO2: 22 mmol/L (ref 20–29)
Calcium: 9.2 mg/dL (ref 8.6–10.2)
Chloride: 106 mmol/L (ref 96–106)
Creatinine, Ser: 1.36 mg/dL — ABNORMAL HIGH (ref 0.76–1.27)
GFR calc Af Amer: 58 mL/min/{1.73_m2} — ABNORMAL LOW (ref >59)
GFR calc non Af Amer: 50 mL/min/{1.73_m2} — ABNORMAL LOW (ref >59)
Glucose: 99 mg/dL (ref 65–99)
Potassium: 4.3 mmol/L (ref 3.5–5.2)
Sodium: 142 mmol/L (ref 134–144)

## 2018-10-03 LAB — HEPATIC FUNCTION PANEL
ALT: 13 IU/L (ref 0–44)
AST: 12 IU/L (ref 0–40)
Albumin: 4.6 g/dL (ref 3.7–4.7)
Alkaline Phosphatase: 61 IU/L (ref 39–117)
Bilirubin Total: 0.5 mg/dL (ref 0.0–1.2)
Bilirubin, Direct: 0.15 mg/dL (ref 0.00–0.40)
Total Protein: 6.5 g/dL (ref 6.0–8.5)

## 2018-10-03 LAB — LIPID PANEL
Chol/HDL Ratio: 2.6 ratio (ref 0.0–5.0)
Cholesterol, Total: 123 mg/dL (ref 100–199)
HDL: 48 mg/dL (ref >39)
LDL Calculated: 52 mg/dL (ref 0–99)
Triglycerides: 117 mg/dL (ref 0–149)
VLDL Cholesterol Cal: 23 mg/dL (ref 5–40)

## 2018-10-03 LAB — CBC WITH DIFFERENTIAL/PLATELET
Basophils Absolute: 0.1 10*3/uL (ref 0.0–0.2)
Basos: 1 %
EOS (ABSOLUTE): 0.6 10*3/uL — ABNORMAL HIGH (ref 0.0–0.4)
Eos: 10 %
Hematocrit: 34.9 % — ABNORMAL LOW (ref 37.5–51.0)
Hemoglobin: 12.7 g/dL — ABNORMAL LOW (ref 13.0–17.7)
Immature Grans (Abs): 0 10*3/uL (ref 0.0–0.1)
Immature Granulocytes: 0 %
Lymphocytes Absolute: 1.2 10*3/uL (ref 0.7–3.1)
Lymphs: 21 %
MCH: 33.8 pg — ABNORMAL HIGH (ref 26.6–33.0)
MCHC: 36.4 g/dL — ABNORMAL HIGH (ref 31.5–35.7)
MCV: 93 fL (ref 79–97)
Monocytes Absolute: 0.7 10*3/uL (ref 0.1–0.9)
Monocytes: 11 %
Neutrophils Absolute: 3.5 10*3/uL (ref 1.4–7.0)
Neutrophils: 57 %
Platelets: 155 10*3/uL (ref 150–450)
RBC: 3.76 x10E6/uL — ABNORMAL LOW (ref 4.14–5.80)
RDW: 12.8 % (ref 11.6–15.4)
WBC: 6.1 10*3/uL (ref 3.4–10.8)

## 2018-10-03 LAB — VITAMIN D 25 HYDROXY (VIT D DEFICIENCY, FRACTURES): Vit D, 25-Hydroxy: 54.3 ng/mL (ref 30.0–100.0)

## 2018-10-05 ENCOUNTER — Ambulatory Visit (INDEPENDENT_AMBULATORY_CARE_PROVIDER_SITE_OTHER): Payer: Medicare Other | Admitting: Family Medicine

## 2018-10-05 ENCOUNTER — Other Ambulatory Visit: Payer: Self-pay

## 2018-10-05 DIAGNOSIS — K219 Gastro-esophageal reflux disease without esophagitis: Secondary | ICD-10-CM

## 2018-10-05 DIAGNOSIS — I10 Essential (primary) hypertension: Secondary | ICD-10-CM

## 2018-10-05 DIAGNOSIS — I700 Atherosclerosis of aorta: Secondary | ICD-10-CM

## 2018-10-05 DIAGNOSIS — E1140 Type 2 diabetes mellitus with diabetic neuropathy, unspecified: Secondary | ICD-10-CM

## 2018-10-05 DIAGNOSIS — E7800 Pure hypercholesterolemia, unspecified: Secondary | ICD-10-CM

## 2018-10-05 DIAGNOSIS — E119 Type 2 diabetes mellitus without complications: Secondary | ICD-10-CM

## 2018-10-05 DIAGNOSIS — E559 Vitamin D deficiency, unspecified: Secondary | ICD-10-CM | POA: Diagnosis not present

## 2018-10-05 DIAGNOSIS — E1122 Type 2 diabetes mellitus with diabetic chronic kidney disease: Secondary | ICD-10-CM

## 2018-10-05 DIAGNOSIS — Z1211 Encounter for screening for malignant neoplasm of colon: Secondary | ICD-10-CM

## 2018-10-05 DIAGNOSIS — Z0000 Encounter for general adult medical examination without abnormal findings: Secondary | ICD-10-CM

## 2018-10-05 DIAGNOSIS — D696 Thrombocytopenia, unspecified: Secondary | ICD-10-CM | POA: Diagnosis not present

## 2018-10-05 NOTE — Patient Instructions (Signed)
Follow-up as planned for colonoscopy this fall with Dr. Carlean Purl If symptoms progress with swallowing issues he should be contacted sooner to arrange for an endoscopy and dilatation Continue to practice good hand and pulmonary hygiene Continue to pursue aggressive therapeutic lifestyle changes including diet and exercise to achieve weight loss Monitor blood sugars throughout the day so that if we can fine-tune with medication to keep the sugars down more in the evening we can do that.  We will need to review these readings after 3 to 4 weeks. Continue to drink plenty of water and stay well-hydrated

## 2018-10-05 NOTE — Addendum Note (Signed)
Addended by: Zannie Cove on: 10/05/2018 11:53 AM   Modules accepted: Orders

## 2018-10-05 NOTE — Progress Notes (Signed)
Virtual Visit Via telephone Note I connected with@ on 10/05/18 by telephone and verified that I am speaking with the correct person or authorized healthcare agent using two identifiers. Joe [name] is currently located at home and there are no unauthorized people in close proximity. I completed this visit while in a private location in my home .  I connected to the patient by telephone and verified that I was speaking with the correct person.  The wife was present during the conversation.  This visit type was conducted due to national recommendations for restrictions regarding the COVID-19 Pandemic (e.g. social distancing).  This format is felt to be most appropriate for this patient at this time.  All issues noted in this document were discussed and addressed.  No physical exam was performed.    I discussed the limitations, risks, security and privacy concerns of performing an evaluation and management service by telephone and the availability of in person appointments. I also discussed with the patient that there may be a patient responsible charge related to this service. The patient expressed understanding and agreed to proceed.   Date:  10/05/2018    ID:  Joe [name]      01-Oct-1940        [id number]   Patient Care Team Patient Care Team: Joe Herb, MD as PCP - General (Family Medicine) Joe Seal, MD (Urology) Joe Mayer, MD (Gastroenterology) Joe Breeding, MD as Consulting Physician (Cardiology) Joe Slim Neena Rhymes, MD as Consulting Physician (Ophthalmology)  Reason for Visit: Primary Care Follow-up     History of Present Illness & Review of Systems:     Joe [name] is a 78 y.o. year old male primary care patient that presents today for a telehealth visit.  The patient is alert and is feeling well overall.  He denies any chest pain pressure tightness or shortness of breath.  He is having some trouble with swallowing and this does not seem to be  progressive and has leveled off.  He understands he is due to have a colonoscopy in November of this year.  If this problem continues he will also need an endoscopy and dilatation too.  He will let us know if the problem gets worse and we will have him seen sooner by Dr. Carlean Purl.  He denies any change in bowel habits.  He denies any trouble with increased heartburn nausea vomiting blood in the stool or black tarry bowel movements.  He is passing his water without problems.  Review of systems as stated otherwise negative for body systems left unmentioned.   The patient does not have symptoms concerning for COVID-19 infection (fever, chills, cough, or new shortness of breath).      Current Medications (Verified) Allergies as of 10/05/2018      Reactions   Ace Inhibitors Cough   Trazamine [trazodone & Diet Manage Prod] Other (See Comments)   nightmares      Medication List       Accurate as of Oct 05, 2018 10:38 AM. Always use your most recent med list.        aspirin 81 MG tablet Take 81 mg by mouth daily.   Blood Glucose Monitoring Suppl w/Device Kit Test BS BID and PRN dx E11.9. Give test strips and lancets to match machine given   cholecalciferol 1000 units tablet Commonly known as:  VITAMIN D Take 2,000 Units by mouth daily.   Cinnamon 500 MG Tabs Take 1,000 tablets  by mouth 2 (two) times daily.   doxylamine (Sleep) 25 MG tablet Commonly known as:  UNISOM Take 25 mg by mouth at bedtime as needed for sleep.   empagliflozin 25 MG Tabs tablet Commonly known as:  Jardiance Take 25 mg by mouth daily.   esomeprazole 40 MG capsule Commonly known as:  NEXIUM TAKE (1) CAPSULE DAILY   glucose blood test strip Commonly known as:  ONE TOUCH ULTRA TEST Check BS qid  Dx: E11.9   glucose blood test strip Commonly known as:  ONE TOUCH ULTRA TEST CHECK BLOOD SUGAR 4 TIMES A DAY   HYDROcodone-homatropine 5-1.5 MG/5ML syrup Commonly known as:  HYCODAN Take 5 mLs by mouth every 6  (six) hours as needed for cough.   insulin degludec 100 UNIT/ML Sopn FlexTouch Pen Commonly known as:  Tyler Aas FlexTouch Inject 0.06-0.2 mLs (6-20 Units total) into the skin daily at 10 pm.   metFORMIN 500 MG 24 hr tablet Commonly known as:  GLUCOPHAGE-XR TAKE 4 TABLETS DAILY AS DIRECTED   mupirocin ointment 2 % Commonly known as:  BACTROBAN Apply 1 application topically 2 (two) times daily.   olmesartan-hydrochlorothiazide 40-12.5 MG tablet Commonly known as:  BENICAR HCT TAKE 1 TABLET DAILY   Pen Needles 32G X 5 MM Misc 1 Device by Does not apply route daily.   predniSONE 20 MG tablet Commonly known as:  Deltasone 2 po at sametime daily for 5 days   PROBIOTIC PO Take 1 tablet by mouth daily.   simvastatin 40 MG tablet Commonly known as:  ZOCOR Take 1 tablet (40 mg total) by mouth every evening.   sitaGLIPtin 100 MG tablet Commonly known as:  Januvia Take 1 tablet (100 mg total) by mouth daily.   triamcinolone ointment 0.5 % Commonly known as:  KENALOG APPLY TO AFFECTED AREAS TWICE A DAY           Allergies (Verified)    Ace inhibitors and Trazamine [trazodone & diet manage prod]  Past Medical History Past Medical History:  Diagnosis Date   Arthritis    Cataract    Chest pain at rest 05/26/2016   Colon polyps    Diabetes mellitus without complication ([place])    Dyslipidemia    ESOPHAGITIS, REFLUX 07/09/2004   Qualifier: Diagnosis of  By: Hardin Negus CMA (AAMA), Stephanie     Hiatal hernia    HOH (hard of hearing)    has bilateral Hearing aids   Hypertension    Irritable bowel syndrome    Kidney stone 1970   Pneumonia    Tick bites    took 5 rounds of Doxycycline this summer     Past Surgical History:  Procedure Laterality Date   COLONOSCOPY     ESOPHAGEAL DILATION  1996   ESOPHAGEAL DILATION  2003   FLEXIBLE SIGMOIDOSCOPY     HEMICOLECTOMY  08/2005   Right   KNEE SURGERY Right    TONSILLECTOMY      Social History    Socioeconomic History   Marital status: Married    Spouse name: Joe [name]   Number of children: 1   Years of education: Not on file   Highest education level: Not on file  Occupational History   Occupation: Retired Scientist, research (physical sciences) strain: Not hard at [employer] insecurity:    Worry: Never true    Inability: Never true   Transportation needs:    Medical: No    Non-medical: No  Tobacco Use  Smoking status: Former Smoker    Packs/day: 2.00    Types: Cigarettes    Start date: 06/03/1954    Last attempt to quit: 08/24/1984    Years since quitting: 34.1   Smokeless tobacco: Never Used   Tobacco comment: Has not used to tobacco products for the past 20 years  Substance and Sexual Activity   Alcohol use: Yes    Alcohol/week: 5.0 standard drinks    Types: 5 Standard drinks or equivalent per week    Comment: Occassionally   Drug use: No   Sexual activity: Yes    Partners: Female  Lifestyle   Physical activity:    Days per week: 2 days    Minutes per session: 60 min   Stress: Not at all  Relationships   Social connections:    Talks on phone: More than three times a week    Gets together: More than three times a week    Attends religious service: Never    Active member of club or organization: No    Attends meetings of clubs or organizations: Never    Relationship status: Married  Other Topics Concern   Not on file  Social History Narrative   Lives in [place]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, both teachers     Family History  Problem Relation Age of Onset   Stroke Father 33   Hyperlipidemia Father    Hypertension Father    Heart disease Brother    Cancer Brother        lung    Hyperlipidemia Brother    Hypertension Brother    Stroke Brother    Diabetes Brother    Breast cancer Mother    Colon cancer Neg Hx    Colon polyps Neg Hx    Kidney disease Neg Hx    Esophageal cancer Neg Hx    Stomach cancer Neg Hx    Rectal  cancer Neg Hx       Labs/Other Tests and Data Reviewed:    Wt Readings from Last 3 Encounters:  07/21/18 171 lb (77.6 kg)  04/15/18 169 lb (76.7 kg)  02/17/18 169 lb (76.7 kg)   Temp Readings from Last 3 Encounters:  07/21/18 97.8 F (36.6 C) (Oral)  04/15/18 (!) 97.2 F (36.2 C) (Oral)  12/16/17 (!) 97.1 F (36.2 C) (Oral)   BP Readings from Last 3 Encounters:  07/21/18 120/63  04/15/18 127/66  02/17/18 118/64   Pulse Readings from Last 3 Encounters:  07/21/18 (!) 59  04/15/18 60  02/17/18 61     Lab Results  Component Value Date   HGBA1C 7.5 (H) 10/02/2018   HGBA1C 7.2 (H) 04/01/2018   HGBA1C 7.7 (H) 11/25/2017   Lab Results  Component Value Date   MICROALBUR negative 06/20/2014   LDLCALC 52 10/02/2018   CREATININE 1.36 (H) 10/02/2018       Chemistry      Component Value Date/Time   NA 142 10/02/2018 1019   K 4.3 10/02/2018 1019   CL 106 10/02/2018 1019   CO2 22 10/02/2018 1019   BUN 20 10/02/2018 1019   CREATININE 1.36 (H) 10/02/2018 1019   CREATININE 1.17 12/24/2012 0850      Component Value Date/Time   CALCIUM 9.2 10/02/2018 1019   ALKPHOS 61 10/02/2018 1019   AST 12 10/02/2018 1019   ALT 13 10/02/2018 1019   BILITOT 0.5 10/02/2018 1019         OBSERVATIONS/ OBJECTIVE:     Patient is alert and  checks his blood sugars regularly and indicates that they run higher in the evening and lower in the morning.  Generally speaking the blood sugars in the morning are in the 80s to the 120 range.  At nighttime they may run from the 140s up to the 200 range.  His weight is stable at 170 pounds.  His blood pressures running in the 1 21-9 25 range systolic and in the 75O diastolic.  He feels good.  Even though he is having some trouble with swallowing he has not had any reflux symptoms that have gotten worse.  Physical exam deferred due to nature of telephonic visit.  ASSESSMENT & PLAN    Time:   Today, I have spent 26 minutes with the patient via  telephone discussing the above including Covid precautions.    Patient's lab work is been reviewed and the results have been forwarded to him.   Visit Diagnoses: 1. Vitamin D deficiency -Continue with vitamin D replacement  2. Pure hypercholesterolemia -Continue with current statin treatment  3. Thrombocytopenia ([place]) -Continue to monitor for bleeding and increased bruising.  The most recent platelet count was good.  4. Type 2 diabetes mellitus with diabetic neuropathy, without long-term current use of insulin (HCC) -The hemoglobin A1c most recently was 7.4% and the patient will continue with current treatment and will try to do better with diet and exercise.  5. Gastroesophageal reflux disease, esophagitis presence not specified -Patient is not having any increased heartburn but he is having more trouble with swallowing.  If this progresses, we will need to see if he can have an endoscopy and dilatation when he gets his colonoscopy this fall.  He has had a previous dilatation in the past.  6. Essential hypertension -The blood pressures at home have been good and he will continue with his current treatment which is currently at half of a pill of Benicar 40-12 0.5 every other day.  7. Type 2 diabetes mellitus with target hemoglobin A1c of less than 7.5 percent ([place]) -Continue monitoring blood sugars regularly.  Check the blood sugars a little more frequently during the day to see what the trend is so that we can better adjust the medication if necessary.  8. Controlled type 2 diabetes mellitus with chronic kidney disease, without long-term current use of insulin, unspecified CKD stage ([place]) -Continue with current treatment avoid NSAIDs and keep blood pressure under the best control possible  9. Abdominal aortic atherosclerosis (HCC) -Continue with statin therapy and aggressive therapeutic lifestyle changes  Patient and wife are aware that he will need a repeat colonoscopy in November  by Dr. Carlean Purl They are also aware that if the swallowing issues progress that they should call us sooner so we could arrange an endoscopy and dilatation sooner if that becomes necessary  Patient Instructions  Follow-up as planned for colonoscopy this fall with Dr. Carlean Purl If symptoms progress with swallowing issues he should be contacted sooner to arrange for an endoscopy and dilatation Continue to practice good hand and pulmonary hygiene Continue to pursue aggressive therapeutic lifestyle changes including diet and exercise to achieve weight loss Monitor blood sugars throughout the day so that if we can fine-tune with medication to keep the sugars down more in the evening we can do that.  We will need to review these readings after 3 to 4 weeks. Continue to drink plenty of water and stay well-hydrated     The above assessment and management plan was discussed with the patient. The patient  verbalized understanding of and has agreed to the management plan. Patient is aware to call the clinic if symptoms persist or worsen. Patient is aware when to return to the clinic for a follow-up visit. Patient educated on when it is appropriate to go to the emergency department.    Joe Herb, MD [address], [place], [place] [address] Ph [phone]   Arrie Senate MD

## 2018-10-07 ENCOUNTER — Other Ambulatory Visit: Payer: Self-pay | Admitting: Family Medicine

## 2018-10-16 ENCOUNTER — Other Ambulatory Visit: Payer: Medicare Other

## 2018-10-16 ENCOUNTER — Other Ambulatory Visit: Payer: Self-pay

## 2018-10-16 DIAGNOSIS — Z0000 Encounter for general adult medical examination without abnormal findings: Secondary | ICD-10-CM

## 2018-10-16 LAB — MICROSCOPIC EXAMINATION
Bacteria, UA: NONE SEEN
Epithelial Cells (non renal): NONE SEEN /hpf (ref 0–10)
RBC, Urine: NONE SEEN /hpf (ref 0–2)
Renal Epithel, UA: NONE SEEN /hpf
WBC, UA: NONE SEEN /hpf (ref 0–5)

## 2018-10-16 LAB — URINALYSIS, COMPLETE
Bilirubin, UA: NEGATIVE
Ketones, UA: NEGATIVE
Leukocytes,UA: NEGATIVE
Nitrite, UA: NEGATIVE
Protein,UA: NEGATIVE
RBC, UA: NEGATIVE
Specific Gravity, UA: <1.005 — ABNORMAL LOW (ref 1.005–1.030)
Urobilinogen, Ur: 0.2 mg/dL (ref 0.2–1.0)
pH, UA: 5.5 (ref 5.0–7.5)

## 2018-12-21 ENCOUNTER — Ambulatory Visit (INDEPENDENT_AMBULATORY_CARE_PROVIDER_SITE_OTHER): Payer: Medicare Other | Admitting: Family Medicine

## 2018-12-21 ENCOUNTER — Encounter: Payer: Self-pay | Admitting: Family Medicine

## 2018-12-21 DIAGNOSIS — H60311 Diffuse otitis externa, right ear: Secondary | ICD-10-CM | POA: Diagnosis not present

## 2018-12-21 MED ORDER — CIPRODEX 0.3-0.1 % OT SUSP: 4.0000 [drp] | Freq: Two times a day (BID) | OTIC | 0 refills | Status: AC

## 2018-12-21 NOTE — Progress Notes (Signed)
Virtual Visit via telephone Note Due to COVID-19, visit is conducted virtually and was requested by patient. This visit type was conducted due to national recommendations for restrictions regarding the COVID-19 Pandemic (e.g. social distancing) in an effort to limit this patient's exposure and mitigate transmission in our community. All issues noted in this document were discussed and addressed.  A physical exam was not performed with this format.   I connected with Joe [name] and his wife on 12/21/18 at [facility] by telephone and verified that I am speaking with the correct person using two identifiers. Joe [name] is currently located at home and family is currently with them during visit. The provider, Monia Pouch, FNP is located in their office at time of visit.  I discussed the limitations, risks, security and privacy concerns of performing an evaluation and management service by telephone and the availability of in person appointments. I also discussed with the patient that there may be a patient responsible charge related to this service. The patient expressed understanding and agreed to proceed.  Subjective:  [name], male    DOB: 1940/12/05, 78 y.o.   MRN: [id number]  Chief Complaint:  Ear Pain   HPI: Joe [name] is a 78 y.o. male presenting on 12/21/2018 for Ear Pain   Pt and wife report right ear pain. States this started several days ago. States his ear was hurting and they noticed a big piece of wax in the canal. States they removed the wax and irrigated the ear well. States the ear was better for one day and then started hurting again. States the canal is swollen, red, and draining. No fever, chills, or loss of hearing. No lymphadenopathy.     Relevant past medical, surgical, family, and social history reviewed and updated as indicated.  Allergies and medications reviewed and updated.   Past Medical History:  Diagnosis Date  . Arthritis   . Cataract    . Chest pain at rest 05/26/2016  . Colon polyps   . Diabetes mellitus without complication ([place])   . Dyslipidemia   . ESOPHAGITIS, REFLUX 07/09/2004   Qualifier: Diagnosis of  By: Hardin Negus CMA (AAMA), [place]    . Hiatal hernia   . HOH (hard of hearing)    has bilateral Hearing aids  . Hypertension   . Irritable bowel syndrome   . Kidney stone 1970  . Pneumonia   . Tick bites    took 5 rounds of Doxycycline this summer    Past Surgical History:  Procedure Laterality Date  . COLONOSCOPY    . ESOPHAGEAL DILATION  1996  . ESOPHAGEAL DILATION  2003  . FLEXIBLE SIGMOIDOSCOPY    . HEMICOLECTOMY  08/2005   Right  . KNEE SURGERY Right   . TONSILLECTOMY      Social History   Socioeconomic History  . Marital status: Married    Spouse name: [name]  . Number of children: 1  . Years of education: Not on file  . Highest education level: Not on file  Occupational History  . Occupation: Retired Tour manager  . Financial resource strain: Not hard at all  . Food insecurity    Worry: Never true    Inability: Never true  . Transportation needs    Medical: No    Non-medical: No  Tobacco Use  . Smoking status: Former Smoker    Packs/day: 2.00    Types: Cigarettes    Start date: 06/03/1954  Quit date: 08/24/1984    Years since quitting: 34.3  . Smokeless tobacco: Never Used  . Tobacco comment: Has not used to tobacco products for the past 20 years  Substance and Sexual Activity  . Alcohol use: Yes    Alcohol/week: 5.0 standard drinks    Types: 5 Standard drinks or equivalent per week    Comment: Occassionally  . Drug use: No  . Sexual activity: Yes    Partners: Female  Lifestyle  . Physical activity    Days per week: 2 days    Minutes per session: 60 min  . Stress: Not at all  Relationships  . Social connections    Talks on phone: More than three times a week    Gets together: More than three times a week    Attends religious service: Never    Active  member of club or organization: No    Attends meetings of clubs or organizations: Never    Relationship status: Married  . Intimate partner violence    Fear of current or ex partner: No    Emotionally abused: No    Physically abused: No    Forced sexual activity: No  Other Topics Concern  . Not on file  Social History Narrative   Lives in [place] with wife, both teachers    Outpatient Encounter Medications as of 12/21/2018  Medication Sig  . aspirin 81 MG tablet Take 81 mg by mouth daily.    . Blood Glucose Monitoring Suppl w/Device KIT Test BS BID and PRN dx E11.9. Give test strips and lancets to match machine given  . cholecalciferol (VITAMIN D) 1000 UNITS tablet Take 2,000 Units by mouth daily.   . Cinnamon 500 MG TABS Take 1,000 tablets by mouth 2 (two) times daily.  . ciprofloxacin-dexamethasone (CIPRODEX) OTIC suspension Place 4 drops into the right ear 2 (two) times daily for 7 days.  Marland Kitchen doxylamine, Sleep, (UNISOM) 25 MG tablet Take 25 mg by mouth at bedtime as needed for sleep.   . empagliflozin (JARDIANCE) 25 MG TABS tablet Take 25 mg by mouth daily.  Marland Kitchen esomeprazole (NEXIUM) 40 MG capsule TAKE (1) CAPSULE DAILY  . glucose blood (ONE TOUCH ULTRA TEST) test strip Check BS qid  Dx: E11.9  . insulin degludec (TRESIBA FLEXTOUCH) 100 UNIT/ML SOPN FlexTouch Pen Inject 0.06-0.2 mLs (6-20 Units total) into the skin daily at 10 pm.  . Insulin Pen Needle (PEN NEEDLES) 32G X 5 MM MISC 1 Device by Does not apply route daily.  . metFORMIN (GLUCOPHAGE-XR) 500 MG 24 hr tablet TAKE 4 TABLETS DAILY AS DIRECTED (Patient taking differently: Take 500 mg by mouth 2 (two) times a day. )  . mupirocin ointment (BACTROBAN) 2 % Apply 1 application topically 2 (two) times daily.  Marland Kitchen olmesartan-hydrochlorothiazide (BENICAR HCT) 40-12.5 MG tablet TAKE 1 TABLET DAILY (Patient taking differently: Take 0.5 tablets by mouth daily. )  . Probiotic Product (PROBIOTIC PO) Take 1 tablet by mouth daily.  .  simvastatin (ZOCOR) 40 MG tablet Take 1 tablet (40 mg total) by mouth every evening.  . sitaGLIPtin (JANUVIA) 100 MG tablet Take 1 tablet (100 mg total) by mouth daily.  Marland Kitchen triamcinolone ointment (KENALOG) 0.5 % APPLY TO AFFECTED AREAS TWICE A DAY  . [DISCONTINUED] glucose blood (ONE TOUCH ULTRA TEST) test strip CHECK BLOOD SUGAR 4 TIMES A DAY (Patient not taking: Reported on 10/05/2018)  . [DISCONTINUED] predniSONE (DELTASONE) 20 MG tablet 2 po at sametime daily for 5 days (Patient not taking:  Reported on 10/05/2018)   No facility-administered encounter medications on file as of 12/21/2018.     Allergies  Allergen Reactions  . Ace Inhibitors Cough  . Trazamine [Trazodone & Diet Manage Prod] Other (See Comments)    nightmares    Review of Systems  Constitutional: Negative for chills, diaphoresis, fatigue and fever.  HENT: Positive for ear discharge, ear pain and hearing loss (chronic, no change). Negative for congestion, facial swelling, sore throat and trouble swallowing.   Respiratory: Negative for cough, chest tightness and shortness of breath.   Cardiovascular: Negative for chest pain and palpitations.  Neurological: Negative for dizziness, weakness, light-headedness and headaches.  Psychiatric/Behavioral: Negative for confusion.  All other systems reviewed and are negative.        Observations/Objective: No vital signs or physical exam, this was a telephone or virtual health encounter.  Pt alert and oriented, answers all questions appropriately, and able to speak in full sentences.    Assessment and Plan: Georges was seen today for ear pain.  Diagnoses and all orders for this visit:  Acute diffuse otitis externa of right ear Reported symptoms and recent irrigation of right ear suggestive of otitis externa. Symptomatic care discussed. Medications as prescribed. Report any new or worsening symptoms or lack of improvement. Follow up in 2 weeks for reevaluation.  -      ciprofloxacin-dexamethasone (CIPRODEX) OTIC suspension; Place 4 drops into the right ear 2 (two) times daily for 7 days.     Follow Up Instructions: Return in about 2 weeks (around 01/04/2019), or if symptoms worsen or fail to improve.    I discussed the assessment and treatment plan with the patient. The patient was provided an opportunity to ask questions and all were answered. The patient agreed with the plan and demonstrated an understanding of the instructions.   The patient was advised to call back or seek an in-person evaluation if the symptoms worsen or if the condition fails to improve as anticipated.  The above assessment and management plan was discussed with the patient. The patient verbalized understanding of and has agreed to the management plan. Patient is aware to call the clinic if symptoms persist or worsen. Patient is aware when to return to the clinic for a follow-up visit. Patient educated on when it is appropriate to go to the emergency department.    I provided 15 minutes of non-face-to-face time during this encounter. The call started at [facility]. The call ended at [facility]. The other time was used for coordination of care.    Monia Pouch, FNP-C [facility] [address] [place], [place] [address] [phone]

## 2018-12-23 ENCOUNTER — Ambulatory Visit (INDEPENDENT_AMBULATORY_CARE_PROVIDER_SITE_OTHER): Payer: Medicare Other | Admitting: Physician Assistant

## 2018-12-23 ENCOUNTER — Encounter: Payer: Self-pay | Admitting: Physician Assistant

## 2018-12-23 DIAGNOSIS — H60331 Swimmer's ear, right ear: Secondary | ICD-10-CM

## 2018-12-23 MED ORDER — PREDNISONE 10 MG (21) PO TBPK: ORAL_TABLET | ORAL | 0 refills | Status: DC

## 2018-12-23 MED ORDER — CIPROFLOXACIN HCL 250 MG PO TABS: 250.0000 mg | ORAL_TABLET | Freq: Two times a day (BID) | ORAL | 0 refills | Status: DC

## 2018-12-23 NOTE — Progress Notes (Signed)
Telephone visit  Subjective: CC:ear pain PCP: Baruch Gouty, FNP DSK:AJGOT Joe [name] is a 78 y.o. male calls for telephone consult today. Patient provides verbal consent for consult held via phone.  Patient is identified with 2 separate identifiers.  At this time the entire area is on COVID-19 social distancing and stay home orders are in place.  Patient is of higher risk and therefore we are performing this by a virtual method.  Location of patient: home Location of provider: [facility] Others present for call: family member  The patient is accompanied by family member because his hearing is decreased.  He normally wears hearing aids and because of the infection has not been able to wear it.  He does think there is a little bit improvement from the Ciprodex that was given 2 days ago.  However there is some red drainage in the ear canal and they were concerned about that.  Pictures were sent to our nurse here and are included in the record.  He does recall that he puts his hearing aids on top of the bit of rice after he is clean them for them to dry out.  And he states he feels like there could have been a grain of rice that got into that ear.  He states he has never had an ear infection and 77 years.  We have discussed that the redness can come from the ear canal being that inflamed.  And I want him to continue the Ciprodex at this time.  We will we will also order some oral ciprofloxacin and a small amount prednisone.  We will have him come back in in about 7 days for recheck on this ear by either Monia Pouch or me.       ROS: Per HPI  Allergies  Allergen Reactions   Ace Inhibitors Cough   Trazamine [Trazodone & Diet Manage Prod] Other (See Comments)    nightmares   Past Medical History:  Diagnosis Date   Arthritis    Cataract    Chest pain at rest 05/26/2016   Colon polyps    Diabetes mellitus without complication ([place])    Dyslipidemia    ESOPHAGITIS, REFLUX  07/09/2004   Qualifier: Diagnosis of  By: Hardin Negus CMA Deborra Medina), Stephanie     Hiatal hernia    HOH (hard of hearing)    has bilateral Hearing aids   Hypertension    Irritable bowel syndrome    Kidney stone 1970   Pneumonia    Tick bites    took 5 rounds of Doxycycline this summer    Current Outpatient Medications:    aspirin 81 MG tablet, Take 81 mg by mouth daily.  , Disp: , Rfl:    Blood Glucose Monitoring Suppl w/Device KIT, Test BS BID and PRN dx E11.9. Give test strips and lancets to match machine given, Disp: 1 each, Rfl: 1   cholecalciferol (VITAMIN D) 1000 UNITS tablet, Take 2,000 Units by mouth daily. , Disp: , Rfl:    Cinnamon 500 MG TABS, Take 1,000 tablets by mouth 2 (two) times daily., Disp: , Rfl:    ciprofloxacin (CIPRO) 250 MG tablet, Take 1 tablet (250 mg total) by mouth 2 (two) times daily., Disp: 14 tablet, Rfl: 0   ciprofloxacin-dexamethasone (CIPRODEX) OTIC suspension, Place 4 drops into the right ear 2 (two) times daily for 7 days., Disp: 7.5 mL, Rfl: 0   doxylamine, Sleep, (UNISOM) 25 MG tablet, Take 25 mg by mouth at  bedtime as needed for sleep. , Disp: , Rfl:    empagliflozin (JARDIANCE) 25 MG TABS tablet, Take 25 mg by mouth daily., Disp: 90 tablet, Rfl: 1   esomeprazole (NEXIUM) 40 MG capsule, TAKE (1) CAPSULE DAILY, Disp: 30 capsule, Rfl: 5   glucose blood (ONE TOUCH ULTRA TEST) test strip, Check BS qid  Dx: E11.9, Disp: 100 each, Rfl: 11   insulin degludec (TRESIBA FLEXTOUCH) 100 UNIT/ML SOPN FlexTouch Pen, Inject 0.06-0.2 mLs (6-20 Units total) into the skin daily at 10 pm., Disp: 2 pen, Rfl: 3   Insulin Pen Needle (PEN NEEDLES) 32G X 5 MM MISC, 1 Device by Does not apply route daily., Disp: 100 each, Rfl: 3   metFORMIN (GLUCOPHAGE-XR) 500 MG 24 hr tablet, TAKE 4 TABLETS DAILY AS DIRECTED (Patient taking differently: Take 500 mg by mouth 2 (two) times a day. ), Disp: 120 tablet, Rfl: 3   mupirocin ointment (BACTROBAN) 2 %, Apply 1 application  topically 2 (two) times daily., Disp: 22 g, Rfl: 1   olmesartan-hydrochlorothiazide (BENICAR HCT) 40-12.5 MG tablet, TAKE 1 TABLET DAILY (Patient taking differently: Take 0.5 tablets by mouth daily. ), Disp: 90 tablet, Rfl: 0   predniSONE (STERAPRED UNI-PAK 21 TAB) 10 MG (21) TBPK tablet, Use as directed on back of pill pack, Disp: 21 tablet, Rfl: 0   Probiotic Product (PROBIOTIC PO), Take 1 tablet by mouth daily., Disp: , Rfl:    simvastatin (ZOCOR) 40 MG tablet, Take 1 tablet (40 mg total) by mouth every evening., Disp: 90 tablet, Rfl: 1   sitaGLIPtin (JANUVIA) 100 MG tablet, Take 1 tablet (100 mg total) by mouth daily., Disp: 90 tablet, Rfl: 2   triamcinolone ointment (KENALOG) 0.5 %, APPLY TO AFFECTED AREAS TWICE A DAY, Disp: 30 g, Rfl: 2  Assessment/ Plan: 78 y.o. male   1. Acute swimmer's ear of right side Continue Ciprodex Recheck in 7 to 10 days - ciprofloxacin (CIPRO) 250 MG tablet; Take 1 tablet (250 mg total) by mouth 2 (two) times daily.  Dispense: 14 tablet; Refill: 0 - predniSONE (STERAPRED UNI-PAK 21 TAB) 10 MG (21) TBPK tablet; Use as directed on back of pill pack  Dispense: 21 tablet; Refill: 0   No follow-ups on file.  Continue all other maintenance medications as listed above.  Start time: 10:12 AM End time: 10:27 AM  Meds ordered this encounter  Medications   ciprofloxacin (CIPRO) 250 MG tablet    Sig: Take 1 tablet (250 mg total) by mouth 2 (two) times daily.    Dispense:  14 tablet    Refill:  0    Order Specific Question:   Supervising Provider    Answer:   Janora Norlander [[id number]]   predniSONE (STERAPRED UNI-PAK 21 TAB) 10 MG (21) TBPK tablet    Sig: Use as directed on back of pill pack    Dispense:  21 tablet    Refill:  0    Order Specific Question:   Supervising Provider    Answer:   Janora Norlander [[id number]]    Particia Nearing PA-C [place] [phone]

## 2018-12-31 ENCOUNTER — Other Ambulatory Visit: Payer: Self-pay

## 2019-01-01 ENCOUNTER — Ambulatory Visit (INDEPENDENT_AMBULATORY_CARE_PROVIDER_SITE_OTHER): Payer: Medicare Other | Admitting: Family Medicine

## 2019-01-01 ENCOUNTER — Encounter: Payer: Self-pay | Admitting: Family Medicine

## 2019-01-01 VITALS — BP 125/72 | HR 66 | Temp 97.8°F | Ht 69.0 in | Wt 169.0 lb

## 2019-01-01 DIAGNOSIS — H65111 Acute and subacute allergic otitis media (mucoid) (sanguinous) (serous), right ear: Secondary | ICD-10-CM

## 2019-01-01 MED ORDER — AMOXICILLIN-POT CLAVULANATE 875-125 MG PO TABS: 1.0000 | ORAL_TABLET | Freq: Two times a day (BID) | ORAL | 0 refills | Status: AC

## 2019-01-01 NOTE — Patient Instructions (Signed)
Otitis Media, Adult  Otitis media means that the middle ear is red and swollen (inflamed) and full of fluid. The condition usually goes away on its own. Follow these instructions at home:  Take over-the-counter and prescription medicines only as told by your doctor.  If you were prescribed an antibiotic medicine, take it as told by your doctor. Do not stop taking the antibiotic even if you start to feel better.  Keep all follow-up visits as told by your doctor. This is important. Contact a doctor if:  You have bleeding from your nose.  There is a lump on your neck.  You are not getting better in 5 days.  You feel worse instead of better. Get help right away if:  You have pain that is not helped with medicine.  You have swelling, redness, or pain around your ear.  You get a stiff neck.  You cannot move part of your face (paralyzed).  You notice that the bone behind your ear hurts when you touch it.  You get a very bad headache. Summary  Otitis media means that the middle ear is red, swollen, and full of fluid.  This condition usually goes away on its own. In some cases, treatment may be needed.  If you were prescribed an antibiotic medicine, take it as told by your doctor. This information is not intended to replace advice given to you by your health care provider. Make sure you discuss any questions you have with your health care provider. Document Released: 11/06/2007 Document Revised: 05/02/2017 Document Reviewed: 06/10/2016 Elsevier Patient Education  2020 Elsevier Inc.  

## 2019-01-01 NOTE — Progress Notes (Signed)
    Subjective:     KINTE TRIM is a 78 y.o. male who presents with ear pain and possible ear infection. Symptoms include: right ear pain, congestion and plugged sensation in the right ear. Onset of symptoms was several days ago, and have been gradually improving since that time. Associated symptoms include: congestion.  Patient denies: chills, coryza, fever , headache and sore throat. He is drinking plenty of fluids. He has recently been treated with Cipro for acute otitis externa. Has completed these antibiotics. States he feels improvement but has residual otalgia.   The following portions of the patient's history were reviewed and updated as appropriate: allergies, current medications, past family history, past medical history, past social history, past surgical history and problem list.  Review of Systems Constitutional: negative Eyes: negative Ears, nose, mouth, throat, and face: positive for earaches Respiratory: negative Cardiovascular: negative Neurological: negative   Objective:    BP 125/72   Pulse 66   Temp 97.8 F (36.6 C) (Oral)   Ht 5\' 9"  (1.753 m)   Wt 169 lb (76.7 kg)   BMI 24.96 kg/m  Physical Exam  Constitutional: He is oriented to person, place, and time and well-developed, well-nourished, and in no distress. He does not have a sickly appearance. No distress.  HENT:  Head: Normocephalic and atraumatic.  Right Ear: Tympanic membrane is erythematous and bulging. A middle ear effusion (mucoid fluid in mid ear) is present. Decreased hearing is noted.  Left Ear: Tympanic membrane is not perforated, not erythematous and not bulging. Decreased hearing is noted.  Eyes: Pupils are equal, round, and reactive to light.  Neck: Normal range of motion. Neck supple. No thyromegaly present.  Cardiovascular: Normal rate, regular rhythm and normal heart sounds. Exam reveals no gallop and no friction rub.  No murmur heard. Lymphadenopathy:    He has no cervical adenopathy.   Neurological: He is alert and oriented to person, place, and time. Gait normal.  Skin: Skin is warm and dry.  Psychiatric: Mood, affect and judgment normal.  Nursing note and vitals reviewed.   Assessment:   Jibril was seen today for otitis media.  Diagnoses and all orders for this visit:  Acute mucoid otitis media of right ear TM bulging with mucoid fluid behind TM. External otitis has resolved. Will treat with Augmentin for 10 days instead of amoxicillin due to recent Cipro use. Report any new or worsening symptoms. Follow up in 2 weeks for recheck.  -     amoxicillin-clavulanate (AUGMENTIN) 875-125 MG tablet; Take 1 tablet by mouth 2 (two) times daily for 10 days.    Plan:    Treatment: Augmentin. OTC analgesia as needed. Fluids, rest, avoid carbonated/alcoholic and caffeinated beverages.  Follow up in 2 weeks if not improving.    The above assessment and management plan was discussed with the patient. The patient verbalized understanding of and has agreed to the management plan. Patient is aware to call the clinic if symptoms fail to improve or worsen. Patient is aware when to return to the clinic for a follow-up visit. Patient educated on when it is appropriate to go to the emergency department.   Monia Pouch, FNP-C [facility] [address] [place], [place] [address] [phone]

## 2019-01-04 ENCOUNTER — Ambulatory Visit (INDEPENDENT_AMBULATORY_CARE_PROVIDER_SITE_OTHER): Payer: Medicare Other | Admitting: Family Medicine

## 2019-01-04 ENCOUNTER — Other Ambulatory Visit: Payer: Self-pay

## 2019-01-04 ENCOUNTER — Encounter: Payer: Self-pay | Admitting: Family Medicine

## 2019-01-04 VITALS — BP 132/73 | HR 59 | Temp 96.6°F | Ht 69.0 in | Wt 169.0 lb

## 2019-01-04 DIAGNOSIS — Z4802 Encounter for removal of sutures: Secondary | ICD-10-CM

## 2019-01-04 NOTE — Progress Notes (Signed)
Subjective:  [name], male    DOB: [birth year], 78 y.o.   MRN: [id number]  Patient Care Team: Baruch Gouty, FNP as PCP - General (Family Medicine) Irine Seal, MD (Urology) Gatha Mayer, MD (Gastroenterology) Minus Breeding, MD as Consulting Physician (Cardiology) Alanda Slim Neena Rhymes, MD as Consulting Physician (Ophthalmology)   Chief Complaint:  Suture / Staple Removal   HPI: Joe [name] is a 78 y.o. male presenting on 01/04/2019 for Suture / Staple Removal   Pt presents today for suture removal. Pt had sutures placed on 12/26/2018 at Urgent Care. Pt denies complications since sutures placed.   Suture / Staple Removal The sutures were placed 7 to 10 days ago. He tried antibiotic ointment use, regular soap and water washings and a wound recheck since the wound repair. The treatment provided significant relief. There has been no drainage from the wound. There is no redness present. There is no swelling present. There is no pain present. He has no difficulty moving the affected extremity or digit.   1. Visit for suture removal      Relevant past medical, surgical, family, and social history reviewed and updated as indicated.  Allergies and medications reviewed and updated. Date reviewed: Chart in Epic.   Past Medical History:  Diagnosis Date  . Arthritis   . Cataract   . Chest pain at rest 05/26/2016  . Colon polyps   . Diabetes mellitus without complication ([place])   . Dyslipidemia   . ESOPHAGITIS, REFLUX 07/09/2004   Qualifier: Diagnosis of  By: Hardin Negus CMA (AAMA), [place]    . Hiatal hernia   . HOH (hard of hearing)    has bilateral Hearing aids  . Hypertension   . Irritable bowel syndrome   . Kidney stone 1970  . Pneumonia   . Tick bites    took 5 rounds of Doxycycline this summer    Past Surgical History:  Procedure Laterality Date  . COLONOSCOPY    . ESOPHAGEAL DILATION  1996  . ESOPHAGEAL DILATION  2003  . FLEXIBLE  SIGMOIDOSCOPY    . HEMICOLECTOMY  08/2005   Right  . KNEE SURGERY Right   . TONSILLECTOMY      Social History   Socioeconomic History  . Marital status: Married    Spouse name: Joe [name]  . Number of children: 1  . Years of education: Not on file  . Highest education level: Not on file  Occupational History  . Occupation: Retired Tour manager  . Financial resource strain: Not hard at all  . Food insecurity    Worry: Never true    Inability: Never true  . Transportation needs    Medical: No    Non-medical: No  Tobacco Use  . Smoking status: Former Smoker    Packs/day: 2.00    Types: Cigarettes    Start date: 06/03/1954    Quit date: 08/24/1984    Years since quitting: 34.3  . Smokeless tobacco: Never Used  . Tobacco comment: Has not used to tobacco products for the past 20 years  Substance and Sexual Activity  . Alcohol use: Yes    Alcohol/week: 5.0 standard drinks    Types: 5 Standard drinks or equivalent per week    Comment: Occassionally  . Drug use: No  . Sexual activity: Yes    Partners: Female  Lifestyle  . Physical activity    Days per week: 2 days    Minutes per session:  60 min  . Stress: Not at all  Relationships  . Social connections    Talks on phone: More than three times a week    Gets together: More than three times a week    Attends religious service: Never    Active member of club or organization: No    Attends meetings of clubs or organizations: Never    Relationship status: Married  . Intimate partner violence    Fear of current or ex partner: No    Emotionally abused: No    Physically abused: No    Forced sexual activity: No  Other Topics Concern  . 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 amoxicillin-clavulanate (AUGMENTIN) 875-125 MG tablet Take 1 tablet by mouth 2 (two) times daily for 10 days.  Marland Kitchen aspirin 81 MG  tablet Take 81 mg by mouth daily.    . Blood Glucose Monitoring Suppl w/Device KIT Test BS BID and PRN dx E11.9. Give test strips and lancets to match machine given  . cholecalciferol (VITAMIN D) 1000 UNITS tablet Take 2,000 Units by mouth daily.   . Cinnamon 500 MG TABS Take 1,000 tablets by mouth 2 (two) times daily.  Marland Kitchen doxylamine, Sleep, (UNISOM) 25 MG tablet Take 25 mg by mouth at bedtime as needed for sleep.   . empagliflozin (JARDIANCE) 25 MG TABS tablet Take 25 mg by mouth daily.  Marland Kitchen esomeprazole (NEXIUM) 40 MG capsule TAKE (1) CAPSULE DAILY  . glucose blood (ONE TOUCH ULTRA TEST) test strip Check BS qid  Dx: E11.9  . insulin degludec (TRESIBA FLEXTOUCH) 100 UNIT/ML SOPN FlexTouch Pen Inject 0.06-0.2 mLs (6-20 Units total) into the skin daily at 10 pm.  . Insulin Pen Needle (PEN NEEDLES) 32G X 5 MM MISC 1 Device by Does not apply route daily.  . metFORMIN (GLUCOPHAGE-XR) 500 MG 24 hr tablet TAKE 4 TABLETS DAILY AS DIRECTED (Patient taking differently: Take 500 mg by mouth 2 (two) times a day. )  . mupirocin ointment (BACTROBAN) 2 % Apply 1 application topically 2 (two) times daily.  Marland Kitchen olmesartan-hydrochlorothiazide (BENICAR HCT) 40-12.5 MG tablet TAKE 1 TABLET DAILY (Patient taking differently: Take 0.5 tablets by mouth daily. )  . Probiotic Product (PROBIOTIC PO) Take 1 tablet by mouth daily.  . simvastatin (ZOCOR) 40 MG tablet Take 1 tablet (40 mg total) by mouth every evening.  . sitaGLIPtin (JANUVIA) 100 MG tablet Take 1 tablet (100 mg total) by mouth daily.  Marland Kitchen triamcinolone ointment (KENALOG) 0.5 % APPLY TO AFFECTED AREAS TWICE A DAY   No facility-administered encounter medications on file as of 01/04/2019.     Allergies  Allergen Reactions  . Ace Inhibitors Cough  . Trazamine [Trazodone & Diet Manage Prod] Other (See Comments)    nightmares    Review of Systems  Constitutional: Negative for chills, fatigue and fever.  Respiratory: Negative for cough and shortness of breath.    Cardiovascular: Negative for chest pain.  Skin: Negative for color change and pallor.  Neurological: Negative for weakness.  Psychiatric/Behavioral: Negative for confusion.  All other systems reviewed and are negative.       Objective:  BP 132/73   Pulse (!) 59   Temp (!) 96.6 F (35.9 C)   Ht _0  (1.753 m)   Wt 169 lb (76.7 kg)   BMI 24.96 kg/m    Wt Readings from  Last 3 Encounters:  01/04/19 169 lb (76.7 kg)  01/01/19 169 lb (76.7 kg)  07/21/18 171 lb (77.6 kg)    Physical Exam Vitals signs and nursing note reviewed.  Constitutional:      Appearance: Normal appearance.  HENT:     Head: Normocephalic.     Right Ear: Ear canal normal. Decreased hearing noted. Tympanic membrane is injected.     Left Ear: Tympanic membrane, ear canal and external ear normal. Decreased hearing noted.     Ears:     Comments: TM slightly injected. Improved since last visit. Currently on antibiotic therapy.     Mouth/Throat:     Mouth: Mucous membranes are moist.  Eyes:     Pupils: Pupils are equal, round, and reactive to light.  Cardiovascular:     Rate and Rhythm: Normal rate and regular rhythm.     Heart sounds: Normal heart sounds.  Pulmonary:     Effort: Pulmonary effort is normal.     Breath sounds: Normal breath sounds.  Skin:    General: Skin is warm and dry.     Capillary Refill: Capillary refill takes less than 2 seconds.     Findings: Wound present.     Comments: Well approximated 2 cm  laceration to tip of left index finger. No drainage, swelling, or erythema. 3 sutures intact.   Neurological:     General: No focal deficit present.     Mental Status: He is alert and oriented to person, place, and time.  Psychiatric:        Mood and Affect: Mood normal.        Behavior: Behavior normal.        Thought Content: Thought content normal.        Judgment: Judgment normal.     Results for orders placed or performed in visit on 10/16/18  Microscopic Examination   URINE   Result Value Ref Range   WBC, UA None seen 0 - 5 /hpf   RBC None seen 0 - 2 /hpf   Epithelial Cells (non renal) None seen 0 - 10 /hpf   Renal Epithel, UA None seen None seen /hpf   Mucus, UA Present Not Estab.   Bacteria, UA None seen None seen/Few  Urinalysis, Complete  Result Value Ref Range   Specific Gravity, UA <1.005 (L) 1.005 - 1.030   pH, UA 5.5 5.0 - 7.5   Color, UA Yellow Yellow   Appearance Ur Clear Clear   Leukocytes,UA Negative Negative   Protein,UA Negative Negative/Trace   Glucose, UA 3+ (A) Negative   Ketones, UA Negative Negative   RBC, UA Negative Negative   Bilirubin, UA Negative Negative   Urobilinogen, Ur 0.2 0.2 - 1.0 mg/dL   Nitrite, UA Negative Negative   Microscopic Examination See below:      Suture Removal  Date/Time: 01/04/2019 4:20 PM Performed by: Baruch Gouty, FNP Authorized by: Baruch Gouty, FNP  Body area: upper extremity Location details: left index finger Wound Appearance: clean, warm and pink Sutures Removed: 3 Post-removal: dressing applied and antibiotic ointment applied Patient tolerance: patient tolerated the procedure well with no immediate complications Comments: Pts name, DOB and allergies verified prior to suture removal. Wound well approximated without erythema, drainage, swelling, or tenderness. Wound cleaned with betadine and sterile water prior to suture removal.       Pertinent labs & imaging results that were available during my care of the patient were reviewed by me and considered  in my medical decision making.  Assessment & Plan:  Salathiel was seen today for suture / staple removal.  Diagnoses and all orders for this visit:  Visit for suture removal Wound well approximated with no signs of infections. Sutures removed with ease. Antibiotic ointment and bandage applied.   Reevaluated right TM - improved since prior visit. Pt to continue antibiotic therapy and to report any new or worsening symptoms.     Continue  all other maintenance medications.  Follow up plan: Return if symptoms worsen or fail to improve.  Continue healthy lifestyle choices, including diet (rich in fruits, vegetables, and lean proteins, and low in salt and simple carbohydrates) and exercise (at least 30 minutes of moderate physical activity daily).   The above assessment and management plan was discussed with the patient. The patient verbalized understanding of and has agreed to the management plan. Patient is aware to call the clinic if symptoms persist or worsen. Patient is aware when to return to the clinic for a follow-up visit. Patient educated on when it is appropriate to go to the emergency department.   Monia Pouch, FNP-C [facility] [phone] 01/04/19

## 2019-01-19 ENCOUNTER — Other Ambulatory Visit: Payer: Self-pay

## 2019-01-19 ENCOUNTER — Other Ambulatory Visit: Payer: Medicare Other

## 2019-01-19 DIAGNOSIS — E7800 Pure hypercholesterolemia, unspecified: Secondary | ICD-10-CM

## 2019-01-19 DIAGNOSIS — Z1211 Encounter for screening for malignant neoplasm of colon: Secondary | ICD-10-CM

## 2019-01-19 DIAGNOSIS — E1140 Type 2 diabetes mellitus with diabetic neuropathy, unspecified: Secondary | ICD-10-CM

## 2019-01-19 DIAGNOSIS — I10 Essential (primary) hypertension: Secondary | ICD-10-CM

## 2019-01-19 LAB — BAYER DCA HB A1C WAIVED: HB A1C (BAYER DCA - WAIVED): 8.5 % — ABNORMAL HIGH (ref <7.0)

## 2019-01-20 ENCOUNTER — Other Ambulatory Visit: Payer: Self-pay | Admitting: Family Medicine

## 2019-01-20 DIAGNOSIS — E1140 Type 2 diabetes mellitus with diabetic neuropathy, unspecified: Secondary | ICD-10-CM

## 2019-01-20 LAB — CMP14+EGFR
ALT: 14 IU/L (ref 0–44)
AST: 17 IU/L (ref 0–40)
Albumin/Globulin Ratio: 2 (ref 1.2–2.2)
Albumin: 4.3 g/dL (ref 3.7–4.7)
Alkaline Phosphatase: 58 IU/L (ref 39–117)
BUN/Creatinine Ratio: 10 (ref 10–24)
BUN: 14 mg/dL (ref 8–27)
Bilirubin Total: 0.6 mg/dL (ref 0.0–1.2)
CO2: 22 mmol/L (ref 20–29)
Calcium: 9.2 mg/dL (ref 8.6–10.2)
Chloride: 102 mmol/L (ref 96–106)
Creatinine, Ser: 1.39 mg/dL — ABNORMAL HIGH (ref 0.76–1.27)
GFR calc Af Amer: 56 mL/min/{1.73_m2} — ABNORMAL LOW (ref >59)
GFR calc non Af Amer: 49 mL/min/{1.73_m2} — ABNORMAL LOW (ref >59)
Globulin, Total: 2.1 g/dL (ref 1.5–4.5)
Glucose: 107 mg/dL — ABNORMAL HIGH (ref 65–99)
Potassium: 4.2 mmol/L (ref 3.5–5.2)
Sodium: 138 mmol/L (ref 134–144)
Total Protein: 6.4 g/dL (ref 6.0–8.5)

## 2019-01-20 LAB — LIPID PANEL
Chol/HDL Ratio: 2.6 ratio (ref 0.0–5.0)
Cholesterol, Total: 126 mg/dL (ref 100–199)
HDL: 49 mg/dL (ref >39)
LDL Calculated: 55 mg/dL (ref 0–99)
Triglycerides: 110 mg/dL (ref 0–149)
VLDL Cholesterol Cal: 22 mg/dL (ref 5–40)

## 2019-01-20 LAB — FECAL OCCULT BLOOD, IMMUNOCHEMICAL: Fecal Occult Bld: NEGATIVE

## 2019-01-20 MED ORDER — METFORMIN HCL ER 500 MG PO TB24: 1000.0000 mg | ORAL_TABLET | Freq: Two times a day (BID) | ORAL | 1 refills | Status: DC

## 2019-01-22 ENCOUNTER — Telehealth: Payer: Self-pay | Admitting: Family Medicine

## 2019-01-22 NOTE — Telephone Encounter (Signed)
40 units and recheck in 3 months

## 2019-01-22 NOTE — Telephone Encounter (Signed)
Pt notified of recommendation Verbalizes understanding 

## 2019-01-22 NOTE — Telephone Encounter (Signed)
Metformin was increased to 1000mg  twice daily.  Yesterday BS at night was 110 without tresiba, this morning BS was 197 without tresiba last night.  How much tresiba does patient need to be taking?

## 2019-01-22 NOTE — Telephone Encounter (Signed)
It should not. He can eat a snack before bed if he is concerned.

## 2019-01-26 ENCOUNTER — Other Ambulatory Visit: Payer: Self-pay

## 2019-01-27 ENCOUNTER — Ambulatory Visit: Payer: Medicare Other | Admitting: Family Medicine

## 2019-01-27 ENCOUNTER — Encounter: Payer: Self-pay | Admitting: Family Medicine

## 2019-01-27 ENCOUNTER — Ambulatory Visit (INDEPENDENT_AMBULATORY_CARE_PROVIDER_SITE_OTHER): Payer: Medicare Other | Admitting: Family Medicine

## 2019-01-27 VITALS — BP 121/60 | HR 62 | Temp 97.7°F | Ht 69.0 in | Wt 170.0 lb

## 2019-01-27 DIAGNOSIS — N183 Chronic kidney disease, stage 3 unspecified: Secondary | ICD-10-CM

## 2019-01-27 DIAGNOSIS — I700 Atherosclerosis of aorta: Secondary | ICD-10-CM

## 2019-01-27 DIAGNOSIS — E1140 Type 2 diabetes mellitus with diabetic neuropathy, unspecified: Secondary | ICD-10-CM

## 2019-01-27 DIAGNOSIS — E1169 Type 2 diabetes mellitus with other specified complication: Secondary | ICD-10-CM

## 2019-01-27 DIAGNOSIS — E1159 Type 2 diabetes mellitus with other circulatory complications: Secondary | ICD-10-CM

## 2019-01-27 DIAGNOSIS — E1122 Type 2 diabetes mellitus with diabetic chronic kidney disease: Secondary | ICD-10-CM

## 2019-01-27 DIAGNOSIS — E785 Hyperlipidemia, unspecified: Secondary | ICD-10-CM

## 2019-01-27 DIAGNOSIS — I10 Essential (primary) hypertension: Secondary | ICD-10-CM

## 2019-01-27 DIAGNOSIS — D696 Thrombocytopenia, unspecified: Secondary | ICD-10-CM

## 2019-01-27 DIAGNOSIS — K219 Gastro-esophageal reflux disease without esophagitis: Secondary | ICD-10-CM

## 2019-01-27 DIAGNOSIS — Z794 Long term (current) use of insulin: Secondary | ICD-10-CM

## 2019-01-27 MED ORDER — TRESIBA FLEXTOUCH 100 UNIT/ML ~~LOC~~ SOPN: 6.0000 [IU] | PEN_INJECTOR | Freq: Every day | SUBCUTANEOUS | 3 refills | Status: DC

## 2019-01-27 MED ORDER — SITAGLIPTIN PHOSPHATE 100 MG PO TABS: 100.0000 mg | ORAL_TABLET | Freq: Every day | ORAL | 2 refills | Status: DC

## 2019-01-27 MED ORDER — SIMVASTATIN 40 MG PO TABS: 40.0000 mg | ORAL_TABLET | Freq: Every evening | ORAL | 1 refills | Status: DC

## 2019-01-27 MED ORDER — ESOMEPRAZOLE MAGNESIUM 40 MG PO CPDR: DELAYED_RELEASE_CAPSULE | ORAL | 5 refills | Status: DC

## 2019-01-27 MED ORDER — JARDIANCE 25 MG PO TABS: 25.0000 mg | ORAL_TABLET | Freq: Every day | ORAL | 1 refills | Status: DC

## 2019-01-27 MED ORDER — OLMESARTAN MEDOXOMIL-HCTZ 40-12.5 MG PO TABS: 0.5000 | ORAL_TABLET | Freq: Every day | ORAL | 6 refills | Status: DC

## 2019-01-27 NOTE — Patient Instructions (Signed)

## 2019-01-27 NOTE — Progress Notes (Signed)
Subjective:  [name], male    DOB: Jun 02, 1941, 78 y.o.   MRN: [id number]  Patient Care Team: Baruch Gouty, FNP as PCP - General (Family Medicine) Irine Seal, MD (Urology) Gatha Mayer, MD (Gastroenterology) Minus Breeding, MD as Consulting Physician (Cardiology) Alanda Slim Neena Rhymes, MD as Consulting Physician (Ophthalmology)   Chief Complaint:  Medical Management of Chronic Issues (4 mo), Diabetes, and Hypertension   HPI: GEARALD STONEBRAKER is a 78 y.o. male presenting on 01/27/2019 for Medical Management of Chronic Issues (4 mo), Diabetes, and Hypertension    1. Type 2 diabetes mellitus with diabetic neuropathy, with long-term current use of insulin ([place])   ROMIR KLIMOWICZ is a 78 y.o. male who presents for follow up evaluation of Type 2 diabetes mellitus.  Current symptoms include hyperglycemia, increase appetite and paresthesia of the feet and have been stable. Symptoms have been present for several weeks. Patient denies foot ulcerations, hypoglycemia , nausea, polydipsia, polyuria, visual disturbances, vomiting and weight loss.  Current diabetic medications include Tresiba, Vania Rea, Metformin, Januvia.  Compliant with meds - Yes  Current monitoring regimen: home blood tests - 2 times daily Home blood sugar records: fasting range: 105-140, postprandial range: 150-300 and trend: fluctuating a lot Any episodes of hypoglycemia? no  Known diabetic complications: nephropathy, peripheral neuropathy, impotence and cardiovascular disease Cardiovascular risk factors: advanced age (older than 44 for men, 36 for women), diabetes mellitus, dyslipidemia, hypertension, male gender, obesity (BMI >= 30 kg/m2) and sedentary lifestyle Eye exam current (within one year): yes Podiatry yearly?  No Weight trend: stable Prior visit with CDE: yes  Current diet: in general, an "healthy" diet, does have some ice cream several times per week Current exercise: none  PNA Vaccine  UTD?  Yes Hep B Vaccine?  Yes Tdap Vaccine UTD?  Yes  Is He on ACE inhibitor or angiotensin II receptor blocker?  Yes: Benicar HCT Is He on statin? Yes Zocor Is He on ASA 81 mg daily?  Yes  Lab Review Glucose (mg/dL)  Date Value  01/19/2019 107 (H)  10/02/2018 99  04/01/2018 108 (H)   Glucose, Bld (mg/dL)  Date Value  05/26/2016 177 (H)  12/24/2012 106 (H)   CO2 (mmol/L)  Date Value  01/19/2019 22  10/02/2018 22  04/01/2018 22   BUN (mg/dL)  Date Value  01/19/2019 14  10/02/2018 20  04/01/2018 23   Creat (mg/dL)  Date Value  12/24/2012 1.17   Creatinine, Ser (mg/dL)  Date Value  01/19/2019 1.39 (H)  10/02/2018 1.36 (H)  04/01/2018 1.54 (H)   Lab Results  Component Value Date   HGBA1C 8.5 (H) 01/19/2019   HGBA1C 7.5 (H) 10/02/2018   HGBA1C 7.2 (H) 04/01/2018    2. Abdominal aortic atherosclerosis (HCC)  On ARB, Statin and ASA daily. No abdominal pain or mass. No lower extremity weakness, temperature change, color change, or increased neuropathy   3. Thrombocytopenia (HCC)  Last platelet count 155. No abnormal bleeding or bruising. No gum bleeding or petechial rash to extremities.    4. CKD stage 3 due to type 2 diabetes mellitus (HCC)  No change in urine output. No swelling, confusion, weakness, or fatigue. No DOE, PND, or orthopnea.    5. Hypertension associated with diabetes ([place])  Complaint with meds - Yes Current Medications - Benicar HCT Checking BP at home ranging 120/55 Exercising Regularly - No Watching Salt intake - No Pertinent ROS:  Headache - No Fatigue - No Visual  Disturbances - No Chest pain - No Dyspnea - No Palpitations - No LE edema - No They report good compliance with medications and can restate their regimen by memory. No medication side effects.  Family, social, and smoking history reviewed.   BP Readings from Last 3 Encounters:  01/27/19 121/60  01/04/19 132/73  01/01/19 125/72   CMP Latest Ref Rng & Units 01/19/2019  10/02/2018 04/01/2018  Glucose 65 - 99 mg/dL 107(H) 99 108(H)  BUN 8 - 27 mg/dL 14 20 23   Creatinine 0.76 - 1.27 mg/dL 1.39(H) 1.36(H) 1.54(H)  Sodium 134 - 144 mmol/L 138 142 140  Potassium 3.5 - 5.2 mmol/L 4.2 4.3 4.4  Chloride 96 - 106 mmol/L 102 106 102  CO2 20 - 29 mmol/L 22 22 22   Calcium 8.6 - 10.2 mg/dL 9.2 9.2 9.2  Total Protein 6.0 - 8.5 g/dL 6.4 6.5 6.7  Total Bilirubin 0.0 - 1.2 mg/dL 0.6 0.5 0.6  Alkaline Phos 39 - 117 IU/L 58 61 57  AST 0 - 40 IU/L 17 12 19   ALT 0 - 44 IU/L 14 13 16       6. Hyperlipidemia associated with type 2 diabetes mellitus (HCC)  Compliant with medications - Yes Current medications - Zocor Side effects from medications - No Diet - well balanced, does eat ice cream several times per week  Exercise - none  Lab Results  Component Value Date   CHOL 126 01/19/2019   HDL 49 01/19/2019   LDLCALC 55 01/19/2019   TRIG 110 01/19/2019   CHOLHDL 2.6 01/19/2019     Family and personal medical history reviewed. Smoking and ETOH history reviewed.    7. Gastroesophageal reflux disease without esophagitis  Compliant with medications - Yes Current medications - Nexium Adverse side effects - No Cough - No Sore throat - No Voice change - No Hemoptysis - No Dysphagia or dyspepsia - No Water brash - No Red Flags (weight loss, hematochezia, melena, weight loss, early satiety, fevers, odynophagia, or persistent vomiting) - No      Relevant past medical, surgical, family, and social history reviewed and updated as indicated.  Allergies and medications reviewed and updated. Date reviewed: Chart in Epic.   Past Medical History:  Diagnosis Date   Arthritis    Cataract    Chest pain at rest 05/26/2016   Colon polyps    Diabetes mellitus without complication ([place])    Dyslipidemia    ESOPHAGITIS, REFLUX 07/09/2004   Qualifier: Diagnosis of  By: Hardin Negus CMA Deborra Medina), Stephanie     Hiatal hernia    HOH (hard of hearing)    has bilateral Hearing  aids   Hypertension    Irritable bowel syndrome    Kidney stone 1970   Pneumonia    Tick bites    took 5 rounds of Doxycycline this summer    Past Surgical History:  Procedure Laterality Date   COLONOSCOPY     ESOPHAGEAL DILATION  1996   ESOPHAGEAL DILATION  2003   FLEXIBLE SIGMOIDOSCOPY     HEMICOLECTOMY  08/2005   Right   KNEE SURGERY Right    TONSILLECTOMY      Social History   Socioeconomic History   Marital status: Married    Spouse name: [name]   Number of children: 1   Years of education: Not on file   Highest education level: Not on file  Occupational History   Occupation: Retired Scientist, research (physical sciences) strain: Not hard at  all   Food insecurity    Worry: Never true    Inability: Never true   Transportation needs    Medical: No    Non-medical: No  Tobacco Use   Smoking status: Former Smoker    Packs/day: 2.00    Types: Cigarettes    Start date: 06/03/1954    Quit date: 08/24/1984    Years since quitting: 34.4   Smokeless tobacco: Never Used   Tobacco comment: Has not used to tobacco products for the past 20 years  Substance and Sexual Activity   Alcohol use: Yes    Alcohol/week: 5.0 standard drinks    Types: 5 Standard drinks or equivalent per week    Comment: Occassionally   Drug use: No   Sexual activity: Yes    Partners: Female  Lifestyle   Physical activity    Days per week: 2 days    Minutes per session: 60 min   Stress: Not at all  Relationships   Social connections    Talks on phone: More than three times a week    Gets together: More than three times a week    Attends religious service: Never    Active member of club or organization: No    Attends meetings of clubs or organizations: Never    Relationship status: Married   Intimate partner violence    Fear of current or ex partner: No    Emotionally abused: No    Physically abused: No    Forced sexual activity: No  Other Topics  Concern   Not on file  Social History Narrative   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     Blood Glucose Monitoring Suppl w/Device KIT Test BS BID and PRN dx E11.9. Give test strips and lancets to match machine given   cholecalciferol (VITAMIN D) 1000 UNITS tablet Take 2,000 Units by mouth daily.    Cinnamon 500 MG TABS Take 1,000 tablets by mouth 2 (two) times daily.   doxylamine, Sleep, (UNISOM) 25 MG tablet Take 25 mg by mouth at bedtime as needed for sleep.    empagliflozin (JARDIANCE) 25 MG TABS tablet Take 25 mg by mouth daily.   esomeprazole (NEXIUM) 40 MG capsule TAKE (1) CAPSULE DAILY   glucose blood (ONE TOUCH ULTRA TEST) test strip Check BS qid  Dx: E11.9   insulin degludec (TRESIBA FLEXTOUCH) 100 UNIT/ML SOPN FlexTouch Pen Inject 0.06-0.2 mLs (6-20 Units total) into the skin daily at 10 pm.   Insulin Pen Needle (PEN NEEDLES) 32G X 5 MM MISC 1 Device by Does not apply route daily.   metFORMIN (GLUCOPHAGE-XR) 500 MG 24 hr tablet Take 2 tablets (1,000 mg total) by mouth 2 (two) times daily with a meal.   olmesartan-hydrochlorothiazide (BENICAR HCT) 40-12.5 MG tablet Take 0.5 tablets by mouth daily.   Probiotic Product (PROBIOTIC PO) Take 1 tablet by mouth daily.   simvastatin (ZOCOR) 40 MG tablet Take 1 tablet (40 mg total) by mouth every evening.   sitaGLIPtin (JANUVIA) 100 MG tablet Take 1 tablet (100 mg total) by mouth daily.   triamcinolone ointment (KENALOG) 0.5 % APPLY TO AFFECTED AREAS TWICE A DAY   [DISCONTINUED] empagliflozin (JARDIANCE) 25 MG TABS tablet Take 25 mg by mouth daily.   [DISCONTINUED] esomeprazole (NEXIUM) 40 MG capsule TAKE (1) CAPSULE DAILY   [DISCONTINUED] olmesartan-hydrochlorothiazide (BENICAR HCT)  40-12.5 MG tablet TAKE 1 TABLET DAILY (Patient taking differently: Take 0.5 tablets by mouth daily. Takes 1/2 tab  QOD)   [DISCONTINUED] simvastatin (ZOCOR) 40 MG tablet Take 1 tablet (40 mg total) by mouth every evening.   [DISCONTINUED] sitaGLIPtin (JANUVIA) 100 MG tablet Take 1 tablet (100 mg total) by mouth daily.   mupirocin ointment (BACTROBAN) 2 % Apply 1 application topically 2 (two) times daily. (Patient not taking: Reported on 01/27/2019)   No facility-administered encounter medications on file as of 01/27/2019.     Allergies  Allergen Reactions   Ace Inhibitors Cough   Trazamine [Trazodone & Diet Manage Prod] Other (See Comments)    nightmares    Review of Systems  Constitutional: Negative for activity change, appetite change, chills, fatigue and fever.  HENT: Positive for hearing loss (chronic). Negative for sore throat, tinnitus, trouble swallowing and voice change.   Eyes: Negative.  Negative for photophobia and visual disturbance.  Respiratory: Negative for cough, chest tightness and shortness of breath.   Cardiovascular: Negative for chest pain, palpitations and leg swelling.  Gastrointestinal: Negative for abdominal distention, abdominal pain, anal bleeding, blood in stool, constipation, diarrhea, nausea, rectal pain and vomiting.  Endocrine: Negative.  Negative for cold intolerance, heat intolerance, polydipsia, polyphagia and polyuria.  Genitourinary: Negative for decreased urine volume, difficulty urinating, dysuria, frequency, hematuria and urgency.  Musculoskeletal: Negative for arthralgias and myalgias.  Skin: Negative for color change and pallor.  Allergic/Immunologic: Negative.   Neurological: Positive for numbness (bilateral lower extremities from shin down). Negative for dizziness, tremors, seizures, syncope, facial asymmetry, speech difficulty, weakness, light-headedness and headaches.  Hematological: Negative for adenopathy. Does not bruise/bleed easily.  Psychiatric/Behavioral: Negative for confusion, hallucinations, sleep disturbance and suicidal ideas.  All other  systems reviewed and are negative.       Objective:  BP 121/60    Pulse 62    Temp 97.7 F (36.5 C)    Ht 5' 9"  (1.753 m)    Wt 170 lb (77.1 kg)    BMI 25.10 kg/m    Wt Readings from Last 3 Encounters:  01/27/19 170 lb (77.1 kg)  01/04/19 169 lb (76.7 kg)  01/01/19 169 lb (76.7 kg)    Physical Exam Vitals signs and nursing note reviewed.  Constitutional:      General: He is not in acute distress.    Appearance: Normal appearance. He is well-developed and well-groomed. He is not ill-appearing, toxic-appearing or diaphoretic.  HENT:     Head: Normocephalic and atraumatic.     Jaw: There is normal jaw occlusion.     Right Ear: Tympanic membrane, ear canal and external ear normal. Decreased hearing noted.     Left Ear: Tympanic membrane, ear canal and external ear normal. Decreased hearing noted.     Ears:     Comments: Chronic hearing loss, bilateral hearing aids    Nose: Nose normal.     Mouth/Throat:     Lips: Pink.     Mouth: Mucous membranes are moist.     Pharynx: Oropharynx is clear. Uvula midline.  Eyes:     General: Lids are normal.     Extraocular Movements: Extraocular movements intact.     Conjunctiva/sclera: Conjunctivae normal.     Pupils: Pupils are equal, round, and reactive to light.  Neck:     Musculoskeletal: Normal range of motion and neck supple.     Thyroid: No thyroid mass, thyromegaly or thyroid tenderness.     Vascular: No carotid bruit or  JVD.     Trachea: Trachea and phonation normal.  Cardiovascular:     Rate and Rhythm: Normal rate and regular rhythm.     Chest Wall: PMI is not displaced.     Pulses: Normal pulses.     Heart sounds: Normal heart sounds. No murmur. No friction rub. No gallop.   Pulmonary:     Effort: Pulmonary effort is normal. No respiratory distress.     Breath sounds: Normal breath sounds. No wheezing.  Chest:     Chest wall: No mass, lacerations or tenderness.  Abdominal:     General: Bowel sounds are normal. There is  no distension or abdominal bruit.     Palpations: Abdomen is soft. There is no shifting dullness, fluid wave, hepatomegaly, splenomegaly, mass or pulsatile mass.     Tenderness: There is no abdominal tenderness. There is no right CVA tenderness or left CVA tenderness.     Hernia: No hernia is present.  Musculoskeletal: Normal range of motion.     Right lower leg: No edema.     Left lower leg: No edema.     Comments: Mild kyphosis   Lymphadenopathy:     Cervical: No cervical adenopathy.  Skin:    General: Skin is warm and dry.     Capillary Refill: Capillary refill takes less than 2 seconds.     Coloration: Skin is not cyanotic, jaundiced or pale.     Findings: No rash.  Neurological:     General: No focal deficit present.     Mental Status: He is alert and oriented to person, place, and time.     Cranial Nerves: Cranial nerves are intact.     Sensory: Sensation is intact.     Motor: Motor function is intact.     Coordination: Coordination is intact.     Gait: Gait is intact.     Deep Tendon Reflexes: Reflexes are normal and symmetric.  Psychiatric:        Attention and Perception: Attention and perception normal.        Mood and Affect: Mood and affect normal.        Speech: Speech normal.        Behavior: Behavior normal. Behavior is cooperative.        Thought Content: Thought content normal.        Cognition and Memory: Cognition and memory normal.        Judgment: Judgment normal.     Results for orders placed or performed in visit on 01/19/19  Fecal occult blood, imunochemical   Specimen: Stool   ST  Result Value Ref Range   Fecal Occult Bld Negative Negative  Bayer DCA Hb A1c Waived  Result Value Ref Range   HB A1C (BAYER DCA - WAIVED) 8.5 (H) <7.0 %  CMP14+EGFR  Result Value Ref Range   Glucose 107 (H) 65 - 99 mg/dL   BUN 14 8 - 27 mg/dL   Creatinine, Ser 1.39 (H) 0.76 - 1.27 mg/dL   GFR calc non Af Amer 49 (L) >59 mL/min/1.73   GFR calc Af Amer 56 (L) >59  mL/min/1.73   BUN/Creatinine Ratio 10 10 - 24   Sodium 138 134 - 144 mmol/L   Potassium 4.2 3.5 - 5.2 mmol/L   Chloride 102 96 - 106 mmol/L   CO2 22 20 - 29 mmol/L   Calcium 9.2 8.6 - 10.2 mg/dL   Total Protein 6.4 6.0 - 8.5 g/dL   Albumin 4.3 3.7 -  4.7 g/dL   Globulin, Total 2.1 1.5 - 4.5 g/dL   Albumin/Globulin Ratio 2.0 1.2 - 2.2   Bilirubin Total 0.6 0.0 - 1.2 mg/dL   Alkaline Phosphatase 58 39 - 117 IU/L   AST 17 0 - 40 IU/L   ALT 14 0 - 44 IU/L  Lipid panel  Result Value Ref Range   Cholesterol, Total 126 100 - 199 mg/dL   Triglycerides 110 0 - 149 mg/dL   HDL 49 >39 mg/dL   VLDL Cholesterol Cal 22 5 - 40 mg/dL   LDL Calculated 55 0 - 99 mg/dL   Chol/HDL Ratio 2.6 0.0 - 5.0 ratio       Pertinent labs & imaging results that were available during my care of the patient were reviewed by me and considered in my medical decision making.  Assessment & Plan:  Damione was seen today for medical management of chronic issues, diabetes and hypertension.  Diagnoses and all orders for this visit:  Type 2 diabetes mellitus with diabetic neuropathy, with long-term current use of insulin ([place]) Lab Results  Component Value Date   HGBA1C 8.5 (H) 01/19/2019   HGBA1C 7.5 (H) 10/02/2018   HGBA1C 7.2 (H) 04/01/2018    Diabetes Control:  poor Instruction/counseling given: reminded to get eye exam, reminded to bring blood glucose meter & log to each visit, reminded to bring medications to each visit, discussed foot care, discussed the need for weight loss, discussed diet and provided printed educational material   1.  Rx changes: Metformin increaed to 1000 mg twice daily 2.  Education: Reviewed ABCs of diabetes management (respective goals in parentheses):  A1C (<7), blood pressure (<130/80), BMI (<25), and cholesterol (LDL <100). 3.  Discussed pathophysiology of DM; difference between type 1 and type 2 DM. 4.  CHO counting diet discussed.  Reviewed CHO amount in various foods and how to  read nutrition labels.  Discussed recommended serving sizes.  5.  Recommend check BG 2 times daily and record readings to bring to next appointment. Report persistent high or low readings. 6.  Recommended increase physical activity - goal is 150 minutes per week and advance as tolerated. 7.  Adequate sleep, at least 6-8 hours per night.  8.  Smoking cessation.  9.  Follow up: 3 months  -     empagliflozin (JARDIANCE) 25 MG TABS tablet; Take 25 mg by mouth daily. -     sitaGLIPtin (JANUVIA) 100 MG tablet; Take 1 tablet (100 mg total) by mouth daily.  Abdominal aortic atherosclerosis (HCC) Continue ARB, Statin, and ASA therapy.  -     olmesartan-hydrochlorothiazide (BENICAR HCT) 40-12.5 MG tablet; Take 0.5 tablets by mouth daily. -     simvastatin (ZOCOR) 40 MG tablet; Take 1 tablet (40 mg total) by mouth every evening.  Thrombocytopenia ([place]) Report any abnormal bleeding, bruising, or rash.   CKD stage 3 due to type 2 diabetes mellitus ([place]) Kidney function remains declined but stable. Importance of BP and b\BS control discussed in detail.  Hypertension associated with diabetes ([place]) BP well controlled. Changes were not made in regimen today. Daily blood pressure log given with instructions on how to fill out and told to bring to next visit. Gaol BP 130/80. Pt aware to report any persistent high or low readings. DASH diet and exercise encouraged. Exercise at least 150 minutes per week and increase as tolerated. Goal BMI > 25. Stress management encouraged. Smoking cessation discussed. Avoid excessive alcohol. Avoid NSAID's. Avoid more than 2000  mg of sodium daily. Medications as prescribed. Follow up as scheduled. well -     olmesartan-hydrochlorothiazide (BENICAR HCT) 40-12.5 MG tablet; Take 0.5 tablets by mouth daily.  Hyperlipidemia associated with type 2 diabetes mellitus ([place]) Diet encouraged - increase intake of fresh fruits and vegetables, increase intake of lean proteins. Bake, broil,  or grill foods. Avoid fried, greasy, and fatty foods. Avoid fast foods. Increase intake of fiber-rich whole grains. Exercise encouraged - at least 150 minutes per week and advance as tolerated.  Goal BMI < 25. Continue medications as prescribed. Follow up in 3-6 months as discussed.  -     simvastatin (ZOCOR) 40 MG tablet; Take 1 tablet (40 mg total) by mouth every evening.  Gastroesophageal reflux disease without esophagitis Diet discussed. Avoid fried, spicy, fatty, and acidic foods. Avoid caffeine, nicotine, and alcohol. Do not eat 2-3 hours before bedtime and stay upright for at least 1-2 hours after eating. Eat small frequent meals. Avoid NSAID's like motrin and aleve. Medications as prescribed. Report any new or worsening symptoms. Follow up as discussed or sooner if needed.   -     esomeprazole (NEXIUM) 40 MG capsule; TAKE (1) CAPSULE DAILY     Continue all other maintenance medications.  Follow up plan: Return in about 3 months (around 04/29/2019), or if symptoms worsen or fail to improve, for DM.  Continue healthy lifestyle choices, including diet (rich in fruits, vegetables, and lean proteins, and low in salt and simple carbohydrates) and exercise (at least 30 minutes of moderate physical activity daily).  Educational handout given for DM  The above assessment and management plan was discussed with the patient. The patient verbalized understanding of and has agreed to the management plan. Patient is aware to call the clinic if symptoms persist or worsen. Patient is aware when to return to the clinic for a follow-up visit. Patient educated on when it is appropriate to go to the emergency department.   Monia Pouch, FNP-C [facility] [phone]

## 2019-02-09 ENCOUNTER — Other Ambulatory Visit: Payer: Self-pay

## 2019-02-09 ENCOUNTER — Encounter: Payer: Self-pay | Admitting: Family Medicine

## 2019-02-09 ENCOUNTER — Ambulatory Visit (INDEPENDENT_AMBULATORY_CARE_PROVIDER_SITE_OTHER): Payer: Medicare Other | Admitting: Family Medicine

## 2019-02-09 VITALS — BP 114/66 | HR 69 | Temp 97.8°F | Wt 168.0 lb

## 2019-02-09 DIAGNOSIS — S91331A Puncture wound without foreign body, right foot, initial encounter: Secondary | ICD-10-CM

## 2019-02-09 NOTE — Patient Instructions (Signed)
You had a tetanus shot in July. Use a protective pad/ material over the lesion as we discussed. Soak 2-3 times daily as we discussed. Monitor for signs and symptoms of infection.  Puncture Wound A puncture wound is an injury that is caused by a sharp, thin object that goes through your skin. A puncture wound usually does not leave a large opening in your skin, so it may not bleed a lot. However, when you get a puncture wound, dirt or other materials (foreign bodies) can be forced into your wound and can break off inside. This increases the chance of infection, such as tetanus. There are many sharp, pointed objects that can cause puncture wounds, including teeth, nails, splinters of glass, fishhooks, and needles. Treatment may include the following steps:  Washing out the wound with a germ-free (sterile) salt-water solution.  Having surgery to open the wound and remove materials from it.  Closing the wound with stitches (sutures).  Covering the wound with antibiotic ointment and a bandage (dressing). Depending on what caused the injury, you may also need a tetanus shot or a rabies shot. Follow these instructions at home: Medicines  Take or apply over-the-counter and prescription medicines only as told by your doctor.  If you were prescribed an antibiotic medicine, take or apply it as told by your doctor. Do not stop using the antibiotic even if your condition starts to get better. Bathing  Keep the bandage dry as told by your doctor.  Do not take baths, swim, or use a hot tub until your doctor approves. Ask your doctor if you may take showers. You may only be allowed to take sponge baths. Wound care   There are many ways to close and cover a wound. For example, a wound can be closed with stitches, skin glue, or skin tape (adhesive strips). Follow instructions from your doctor about how to take care of your wound. Make sure you: ? Wash your hands with soap and water before and after you  change your bandage. If you cannot use soap and water, use hand sanitizer. ? Change your bandage as told by your doctor. ? Leave stitches, skin glue, or skin tape strips in place. They may need to stay in place for 2 weeks or longer. If tape strips get loose and curl up, you may trim the loose edges. Do not remove tape strips completely unless your doctor says it is okay.  Clean the wound as told by your doctor.  Do not scratch or pick at the wound.  Check your wound every day for signs of infection. Watch for: ? Redness, swelling, or pain. ? Fluid or blood. ? Warmth. ? Pus or a bad smell. General instructions  Raise (elevate) the injured area above the level of your heart while you are sitting or lying down.  If your puncture wound is in your foot, ask your doctor if you need to avoid putting weight on your foot and for how long. Use crutches as told by your doctor.  Keep all follow-up visits as told by your doctor. This is important. Contact a doctor if:  You got a tetanus shot and you have any of these problems at the injection site: ? Swelling. ? Very bad pain. ? Redness. ? Bleeding.  You have a fever.  Your stitches come out.  You notice a bad smell coming from your wound or your bandage.  You notice something coming out of the wound, such as wood or glass.  Medicine  does not help your pain.  You have more redness, swelling, or pain at the site of your wound.  You have fluid, blood, or pus coming from your wound.  You notice a change in the color of your skin near your wound.  You need to change the bandage often because fluid, blood, or pus is coming from the wound.  You start to have a new rash.  You start to lose feeling (have numbness) around the wound.  You have warmth around your wound. Get help right away if:  You have very bad swelling around the wound.  Your pain quickly gets worse and is very bad.  You start to get painful skin lumps.  You  have a red streak going away from your wound.  The wound is on your hand or foot and you: ? Cannot move a finger or toe like normal. ? Notice that your fingers or toes look pale or blue. Summary  A puncture wound is an injury that is caused by a sharp, thin object that goes through your skin.  Treatment may include washing out the wound, having surgery to open the wound to clean it, closing the wound, and covering the wound with a bandage.  Follow instructions from your doctor about how to take care of your wound.  Contact your doctor if you have more redness, swelling, or pain at the site of your wound.  Keep all follow-up visits as told by your doctor. This is important. This information is not intended to replace advice given to you by your health care provider. Make sure you discuss any questions you have with your health care provider. Document Released: 02/27/2008 Document Revised: 12/25/2017 Document Reviewed: 12/25/2017 Elsevier Patient Education  2020 [employer].

## 2019-02-09 NOTE — Progress Notes (Signed)
Subjective: CC: Foot wound PCP: Baruch Gouty, FNP Joe [name] is a 78 y.o. male presenting to clinic today for:  1.  Foot wound Patient is accompanied by his wife today's appointment who notes that she saw a spot on his right plantar foot yesterday.  He notes it is sore.  He does report having walked around the yard barefooted recently but does not specifically remember stepping on anything.  He does have neuropathy in the feet but overall is able to feel the feet.  No drainage.  No significant redness.  No visible skin breakdown.   ROS: Per HPI  Allergies  Allergen Reactions  . Ace Inhibitors Cough  . Trazamine [Trazodone & Diet Manage Prod] Other (See Comments)    nightmares   Past Medical History:  Diagnosis Date  . Arthritis   . Cataract   . Chest pain at rest 05/26/2016  . Colon polyps   . Diabetes mellitus without complication ([place])   . Dyslipidemia   . ESOPHAGITIS, REFLUX 07/09/2004   Qualifier: Diagnosis of  By: Hardin Negus CMA (AAMA), [place]    . Hiatal hernia   . HOH (hard of hearing)    has bilateral Hearing aids  . Hypertension   . Irritable bowel syndrome   . Kidney stone 1970  . Pneumonia   . Tick bites    took 5 rounds of Doxycycline this summer    Current Outpatient Medications:  .  aspirin 81 MG tablet, Take 81 mg by mouth daily.  , Disp: , Rfl:  .  Blood Glucose Monitoring Suppl w/Device KIT, Test BS BID and PRN dx E11.9. Give test strips and lancets to match machine given, Disp: 1 each, Rfl: 1 .  cholecalciferol (VITAMIN D) 1000 UNITS tablet, Take 2,000 Units by mouth daily. , Disp: , Rfl:  .  Cinnamon 500 MG TABS, Take 1,000 tablets by mouth 2 (two) times daily., Disp: , Rfl:  .  doxylamine, Sleep, (UNISOM) 25 MG tablet, Take 25 mg by mouth at bedtime as needed for sleep. , Disp: , Rfl:  .  empagliflozin (JARDIANCE) 25 MG TABS tablet, Take 25 mg by mouth daily., Disp: 90 tablet, Rfl: 1 .  esomeprazole (NEXIUM) 40 MG capsule, TAKE (1) CAPSULE  DAILY, Disp: 30 capsule, Rfl: 5 .  glucose blood (ONE TOUCH ULTRA TEST) test strip, Check BS qid  Dx: E11.9, Disp: 100 each, Rfl: 11 .  insulin degludec (TRESIBA FLEXTOUCH) 100 UNIT/ML SOPN FlexTouch Pen, Inject 0.06-0.2 mLs (6-20 Units total) into the skin daily at 10 pm., Disp: 2 pen, Rfl: 3 .  Insulin Pen Needle (PEN NEEDLES) 32G X 5 MM MISC, 1 Device by Does not apply route daily., Disp: 100 each, Rfl: 3 .  metFORMIN (GLUCOPHAGE-XR) 500 MG 24 hr tablet, Take 2 tablets (1,000 mg total) by mouth 2 (two) times daily with a meal. (Patient taking differently: Take 1,000 mg by mouth. Taking 2 tabs every am and 1 tab at night), Disp: 360 tablet, Rfl: 1 .  mupirocin ointment (BACTROBAN) 2 %, Apply 1 application topically 2 (two) times daily. (Patient not taking: Reported on 01/27/2019), Disp: 22 g, Rfl: 1 .  olmesartan-hydrochlorothiazide (BENICAR HCT) 40-12.5 MG tablet, Take 0.5 tablets by mouth daily., Disp: 15 tablet, Rfl: 6 .  Probiotic Product (PROBIOTIC PO), Take 1 tablet by mouth daily., Disp: , Rfl:  .  simvastatin (ZOCOR) 40 MG tablet, Take 1 tablet (40 mg total) by mouth every evening., Disp: 90 tablet, Rfl: 1 .  sitaGLIPtin (JANUVIA) 100 MG tablet, Take 1 tablet (100 mg total) by mouth daily., Disp: 90 tablet, Rfl: 2 .  triamcinolone ointment (KENALOG) 0.5 %, APPLY TO AFFECTED AREAS TWICE A DAY, Disp: 30 g, Rfl: 2 Social History   Socioeconomic History  . Marital status: Married    Spouse name: [name]  . Number of children: 1  . Years of education: Not on file  . Highest education level: Not on file  Occupational History  . Occupation: Retired Tour manager  . Financial resource strain: Not hard at all  . Food insecurity    Worry: Never true    Inability: Never true  . Transportation needs    Medical: No    Non-medical: No  Tobacco Use  . Smoking status: Former Smoker    Packs/day: 2.00    Types: Cigarettes    Start date: 06/03/1954    Quit date: 08/24/1984    Years  since quitting: 34.4  . Smokeless tobacco: Never Used  . Tobacco comment: Has not used to tobacco products for the past 20 years  Substance and Sexual Activity  . Alcohol use: Yes    Alcohol/week: 5.0 standard drinks    Types: 5 Standard drinks or equivalent per week    Comment: Occassionally  . Drug use: No  . Sexual activity: Yes    Partners: Female  Lifestyle  . Physical activity    Days per week: 2 days    Minutes per session: 60 min  . Stress: Not at all  Relationships  . Social connections    Talks on phone: More than three times a week    Gets together: More than three times a week    Attends religious service: Never    Active member of club or organization: No    Attends meetings of clubs or organizations: Never    Relationship status: Married  . Intimate partner violence    Fear of current or ex partner: No    Emotionally abused: No    Physically abused: No    Forced sexual activity: No  Other Topics Concern  . 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 Stroke Father 71  . Hyperlipidemia Father   . Hypertension Father   . Heart disease Brother   . Cancer Brother        lung   . Hyperlipidemia Brother   . Hypertension Brother   . Stroke Brother   . Diabetes Brother   . Breast cancer Mother   . Colon cancer Neg Hx   . Colon polyps Neg Hx   . Kidney disease Neg Hx   . Esophageal cancer Neg Hx   . Stomach cancer Neg Hx   . Rectal cancer Neg Hx     Objective: Office vital signs reviewed. BP 114/66   Pulse 69   Temp 97.8 F (36.6 C)   Wt 168 lb (76.2 kg)   BMI 24.81 kg/m   Physical Examination:  General: Awake, alert, well nourished, No acute distress Skin: Plantar aspect of right foot with a 1 mm lesion with central hyperpigmentation.  There is no skin breakdown.  In fact under light examination there is a small skin flap indicating this was a puncture  wound.  No significant surrounding erythema.  The lesion is slightly indurated and tender to touch.  No palpable fluctuance.  No drainage.  Assessment/ Plan: 78 y.o. male   1. Puncture wound of right foot, initial encounter Consistent with a puncture wound.  Does not appear to be a diabetic foot ulcer.  There was no skin breakdown.  No retained foreign body within the puncture wound.  No evidence of secondary infection.  We discussed frequent foot soaks and use of protective padding over the lesion.  He is up-to-date on his tetanus shot as this was administered in July.  We discussed signs and symptoms of infection and reasons for reevaluation.  Both he and his wife voiced good understanding will follow-up PRN   No orders of the defined types were placed in this encounter.  No orders of the defined types were placed in this encounter.    Janora Norlander, DO [place] [phone]

## 2019-02-16 ENCOUNTER — Encounter: Payer: Self-pay | Admitting: Nurse Practitioner

## 2019-02-16 ENCOUNTER — Other Ambulatory Visit: Payer: Self-pay

## 2019-02-16 ENCOUNTER — Ambulatory Visit (INDEPENDENT_AMBULATORY_CARE_PROVIDER_SITE_OTHER): Payer: Medicare Other | Admitting: Nurse Practitioner

## 2019-02-16 VITALS — BP 129/70 | HR 58 | Temp 98.0°F | Resp 16 | Ht 69.0 in | Wt 170.0 lb

## 2019-02-16 DIAGNOSIS — L03211 Cellulitis of face: Secondary | ICD-10-CM

## 2019-02-16 MED ORDER — SULFAMETHOXAZOLE-TRIMETHOPRIM 800-160 MG PO TABS: 1.0000 | ORAL_TABLET | Freq: Two times a day (BID) | ORAL | 0 refills | Status: DC

## 2019-02-16 NOTE — Progress Notes (Signed)
   Subjective:    [name], male    DOB: 05-20-1941, 78 y.o.   MRN: [id number]   Chief Complaint: Facial Swelling (Hit with tree branch 3 days ago)   HPI Patient comes in today stating that he got hit in the left eye with a treee branch several days ago. Now eye is swollen and red.   Review of Systems  Constitutional: Negative for activity change and appetite change.  HENT: Negative.   Eyes: Negative for photophobia, pain, discharge, redness, itching and visual disturbance.  Respiratory: Negative for shortness of breath.   Cardiovascular: Negative for chest pain, palpitations and leg swelling.  Gastrointestinal: Negative for abdominal pain.  Endocrine: Negative for polydipsia.  Genitourinary: Negative.   Skin: Negative for rash.  Neurological: Negative for dizziness, weakness and headaches.  Hematological: Does not bruise/bleed easily.  Psychiatric/Behavioral: Negative.   All other systems reviewed and are negative.      Objective:   Physical Exam Vitals signs and nursing note reviewed.  Constitutional:      Appearance: Normal appearance.  Eyes:     General:        Left eye: No foreign body or discharge.     Conjunctiva/sclera:     Left eye: Left conjunctiva is not injected. No exudate or hemorrhage.    Comments: Left lower lid edema- mild erythema  Cardiovascular:     Rate and Rhythm: Normal rate and regular rhythm.     Heart sounds: Normal heart sounds.  Pulmonary:     Breath sounds: Normal breath sounds.  Skin:    Comments: 2cm scabbed laceration to left cheek. No drainage noted at office  Neurological:     Mental Status: He is alert.      BP 129/70   Pulse (!) 58   Temp 98 F (36.7 C)   Resp 16   Ht 5\' 9"  (1.753 m)   Wt 170 lb (77.1 kg)   SpO2 100%   BMI 25.10 kg/m         Assessment & Plan:  Hetty Blend in today with chief complaint of Facial Swelling (Hit with tree branch 3 days ago)   1. Facial cellulitis Ice bid  Avoid rubbing Meds ordered this encounter  Medications  . sulfamethoxazole-trimethoprim (BACTRIM DS) 800-160 MG tablet    Sig: Take 1 tablet by mouth 2 (two) times daily.    Dispense:  20 tablet    Refill:  0    Order Specific Question:   Supervising Provider    Answer:   Worthy Rancher [id number]   [place], FNP

## 2019-02-16 NOTE — Patient Instructions (Signed)

## 2019-02-17 ENCOUNTER — Other Ambulatory Visit: Payer: Self-pay | Admitting: *Deleted

## 2019-02-17 ENCOUNTER — Ambulatory Visit (INDEPENDENT_AMBULATORY_CARE_PROVIDER_SITE_OTHER): Payer: Medicare Other | Admitting: *Deleted

## 2019-02-17 DIAGNOSIS — L03211 Cellulitis of face: Secondary | ICD-10-CM

## 2019-02-17 MED ORDER — CEFTRIAXONE SODIUM 1 G IJ SOLR
1.0000 g | Freq: Once | INTRAMUSCULAR | Status: AC
  Administered 2019-02-17: 1 g via INTRAMUSCULAR

## 2019-02-17 MED ORDER — AMOXICILLIN-POT CLAVULANATE 875-125 MG PO TABS: 1.0000 | ORAL_TABLET | Freq: Two times a day (BID) | ORAL | 0 refills | Status: DC

## 2019-02-17 NOTE — Progress Notes (Signed)
Rocephin 1gm given and patient tolerated well.

## 2019-02-18 ENCOUNTER — Telehealth: Payer: Self-pay | Admitting: Family Medicine

## 2019-02-18 NOTE — Telephone Encounter (Signed)
Wife states that patient was seen yesterday for cellulitis and still has some redness in the face.  Explained to wife that this can be normal and it may take a few days to completely disappear.

## 2019-02-18 NOTE — Telephone Encounter (Signed)
Yes. He was given Rocephin IM and prescribed an additional antibiotic to take with the Bactrim. If he develops fever, chills, weakness, confusion, or visual changes, he needs to go to ED.

## 2019-02-18 NOTE — Telephone Encounter (Signed)
Wife aware

## 2019-02-23 ENCOUNTER — Ambulatory Visit (INDEPENDENT_AMBULATORY_CARE_PROVIDER_SITE_OTHER): Payer: Medicare Other | Admitting: *Deleted

## 2019-02-23 DIAGNOSIS — Z0000 Encounter for general adult medical examination without abnormal findings: Secondary | ICD-10-CM | POA: Diagnosis not present

## 2019-02-23 NOTE — Progress Notes (Signed)
MEDICARE ANNUAL WELLNESS VISIT  02/23/2019  Telephone Visit Disclaimer This Medicare AWV was conducted by telephone due to national recommendations for restrictions regarding the COVID-19 Pandemic (e.g. social distancing).  I verified, using two identifiers, that I am speaking with Joe [name] or their authorized healthcare agent. I discussed the limitations, risks, security, and privacy concerns of performing an evaluation and management service by telephone and the potential availability of an in-person appointment in the future. The patient expressed understanding and agreed to proceed.   Subjective:  Joe [name] is a 78 y.o. male patient of Rakes, Connye Burkitt, FNP who had a Medicare Annual Wellness Visit today via telephone. Joe [name] is Retired and lives with their spouse. he has 1 child. he reports that he is socially active and does interact with friends/family regularly. he is moderately physically active and enjoys making elephant ear bird fountains and bird feeders as well as the decorative light up balls you hang in the tress at Christmas time.  Patient Care Team: Baruch Gouty, FNP as PCP - General (Family Medicine) Irine Seal, MD (Urology) Gatha Mayer, MD (Gastroenterology) Minus Breeding, MD as Consulting Physician (Cardiology) Alanda Slim Neena Rhymes, MD as Consulting Physician (Ophthalmology)  Advanced Directives 02/23/2019 02/17/2018 11/07/2016 05/26/2016 11/01/2015 04/06/2014  Does Patient Have a Medical Advance Directive? No No No No Yes No  Type of Advance Directive - - - - Press photographer;Living will -  Copy of [place] in Chart? - - - - No - copy requested -  Would patient like information on creating a medical advance directive? No - Patient declined No - Patient declined - - - [facility] Utilization Over the Past 12 Months: # of hospitalizations or ER visits: 0 # of surgeries: 0  Review of Systems    Patient reports that his  overall health is unchanged compared to last year.  History obtained from chart review  Patient Reported Readings (BP, Pulse, CBG, Weight, etc) none  Pain Assessment Pain : No/denies pain     Current Medications & Allergies (verified) Allergies as of 02/23/2019      Reactions   Ace Inhibitors Cough   Trazamine [trazodone & Diet Manage Prod] Other (See Comments)   nightmares      Medication List       Accurate as of February 23, 2019  9:47 AM. If you have any questions, ask your nurse or doctor.        amoxicillin-clavulanate 875-125 MG tablet Commonly known as: AUGMENTIN Take 1 tablet by mouth 2 (two) times daily.   aspirin 81 MG tablet Take 81 mg by mouth daily.   Blood Glucose Monitoring Suppl w/Device Kit Test BS BID and PRN dx E11.9. Give test strips and lancets to match machine given   cholecalciferol 1000 units tablet Commonly known as: VITAMIN D Take 2,000 Units by mouth daily.   Cinnamon 500 MG Tabs Take 1,000 tablets by mouth 2 (two) times daily.   doxylamine (Sleep) 25 MG tablet Commonly known as: UNISOM Take 25 mg by mouth at bedtime as needed for sleep.   esomeprazole 40 MG capsule Commonly known as: NEXIUM TAKE (1) CAPSULE DAILY   glucose blood test strip Commonly known as: ONE TOUCH ULTRA TEST Check BS qid  Dx: E11.9   Jardiance 25 MG Tabs tablet Generic drug: empagliflozin Take 25 mg by mouth daily.   metFORMIN 500 MG 24 hr tablet Commonly known as: GLUCOPHAGE-XR Take  2 tablets (1,000 mg total) by mouth 2 (two) times daily with a meal. What changed:   when to take this  additional instructions   mupirocin ointment 2 % Commonly known as: BACTROBAN Apply 1 application topically 2 (two) times daily.   olmesartan-hydrochlorothiazide 40-12.5 MG tablet Commonly known as: BENICAR HCT Take 0.5 tablets by mouth daily.   Pen Needles 32G X 5 MM Misc 1 Device by Does not apply route daily.   PROBIOTIC PO Take 1 tablet by mouth daily.    simvastatin 40 MG tablet Commonly known as: ZOCOR Take 1 tablet (40 mg total) by mouth every evening.   sitaGLIPtin 100 MG tablet Commonly known as: Januvia Take 1 tablet (100 mg total) by mouth daily.   sulfamethoxazole-trimethoprim 800-160 MG tablet Commonly known as: Bactrim DS Take 1 tablet by mouth 2 (two) times daily.   Tyler Aas FlexTouch 100 UNIT/ML Sopn FlexTouch Pen Generic drug: insulin degludec Inject 0.06-0.2 mLs (6-20 Units total) into the skin daily at 10 pm.   triamcinolone ointment 0.5 % Commonly known as: KENALOG APPLY TO AFFECTED AREAS TWICE A DAY       History (reviewed): Past Medical History:  Diagnosis Date  . Arthritis   . Cataract   . Chest pain at rest 05/26/2016  . Colon polyps   . Diabetes mellitus without complication ([place])   . Dyslipidemia   . ESOPHAGITIS, REFLUX 07/09/2004   Qualifier: Diagnosis of  By: Hardin Negus CMA (AAMA), [place]    . Hiatal hernia   . HOH (hard of hearing)    has bilateral Hearing aids  . Hypertension   . Irritable bowel syndrome   . Kidney stone 1970  . Pneumonia   . Tick bites    took 5 rounds of Doxycycline this summer   Past Surgical History:  Procedure Laterality Date  . COLONOSCOPY    . ESOPHAGEAL DILATION  1996  . ESOPHAGEAL DILATION  2003  . FLEXIBLE SIGMOIDOSCOPY    . HEMICOLECTOMY  08/2005   Right  . KNEE SURGERY Right   . TONSILLECTOMY     Family History  Problem Relation Age of Onset  . Stroke Father 27  . Hyperlipidemia Father   . Hypertension Father   . Heart disease Brother   . Cancer Brother        lung   . Hyperlipidemia Brother   . Hypertension Brother   . Stroke Brother   . Diabetes Brother   . Breast cancer Mother   . Colon cancer Neg Hx   . Colon polyps Neg Hx   . Kidney disease Neg Hx   . Esophageal cancer Neg Hx   . Stomach cancer Neg Hx   . Rectal cancer Neg Hx    Social History   Socioeconomic History  . Marital status: Married    Spouse name: [name]  . Number  of children: 1  . Years of education: 33  . Highest education level: Bachelor's degree (e.g., BA, AB, BS)  Occupational History  . Occupation: Retired Tour manager  . Financial resource strain: Not hard at all  . Food insecurity    Worry: Never true    Inability: Never true  . Transportation needs    Medical: No    Non-medical: No  Tobacco Use  . Smoking status: Former Smoker    Packs/day: 2.00    Types: Cigarettes    Start date: 06/03/1954    Quit date: 08/24/1984    Years since quitting: 34.5  .  Smokeless tobacco: Never Used  . Tobacco comment: Has not used to tobacco products for the past 20 years  Substance and Sexual Activity  . Alcohol use: Yes    Alcohol/week: 7.0 standard drinks    Types: 7 Standard drinks or equivalent per week    Comment: [place] at night when he sits on the deck  . Drug use: No  . Sexual activity: Yes    Partners: Female  Lifestyle  . Physical activity    Days per week: 3 days    Minutes per session: 30 min  . Stress: Not at all  Relationships  . Social connections    Talks on phone: More than three times a week    Gets together: More than three times a week    Attends religious service: Never    Active member of club or organization: No    Attends meetings of clubs or organizations: Never    Relationship status: Married  Other Topics Concern  . Not on file  Social History Narrative   Lives in [place] with wife, both teachers    Activities of Daily Living In your present state of health, do you have any difficulty performing the following activities: 02/23/2019  Hearing? Y  Comment wears hearing aids and they help  Vision? N  Comment wears glasses and has a yearly eye exam  Difficulty concentrating or making decisions? N  Walking or climbing stairs? N  Dressing or bathing? N  Doing errands, shopping? N  Preparing Food and eating ? N  Using the Toilet? N  In the past six months, have you accidently leaked urine? N  Do  you have problems with loss of bowel control? N  Managing your Medications? N  Managing your Finances? N  Housekeeping or managing your Housekeeping? N  Some recent data might be hidden    Patient Education/ Literacy How often do you need to have someone help you when you read instructions, pamphlets, or other written materials from your doctor or pharmacy?: 1 - Never What is the last grade level you completed in school?: 4 years of college  Exercise Current Exercise Habits: Home exercise routine, Type of exercise: treadmill, Time (Minutes): 30, Frequency (Times/Week): 3, Weekly Exercise (Minutes/Week): 90, Intensity: Mild, Exercise limited by: None identified  Diet Patient reports consuming 2 meals a day and 1 snack(s) a day Patient reports that his primary diet is: Regular Patient reports that she does have regular access to food.   Depression Screen PHQ 2/9 Scores 02/23/2019 02/16/2019 02/09/2019 01/27/2019 01/04/2019 07/21/2018 04/15/2018  PHQ - 2 Score 0 0 0 0 0 0 0  PHQ- 9 Score - - 0 - - - -     Fall Risk Fall Risk  02/23/2019 02/16/2019 01/27/2019 01/04/2019 07/21/2018  Falls in the past year? 0 0 0 0 0  Number falls in past yr: 0 - - - -  Injury with Fall? 0 - - - -  Follow up Falls prevention discussed - - - -  Comment no throw rugs in the house, adequate lighting in the walkways and grab bars in the bathroom - - - -     Objective:  Joe [name] seemed alert and oriented and he participated appropriately during our telephone visit.  Blood Pressure Weight BMI  BP Readings from Last 3 Encounters:  02/16/19 129/70  02/09/19 114/66  01/27/19 121/60   Wt Readings from Last 3 Encounters:  02/16/19 170 lb (77.1 kg)  02/09/19 168 lb (  76.2 kg)  01/27/19 170 lb (77.1 kg)   BMI Readings from Last 1 Encounters:  02/16/19 25.10 kg/m    *Unable to obtain current vital signs, weight, and BMI due to telephone visit type  Hearing/Vision  . Joe [name] did not seem to have difficulty with  hearing/understanding during the telephone conversation . Reports that he has had a formal eye exam by an eye care professional within the past year . Reports that he has not had a formal hearing evaluation within the past year *Unable to fully assess hearing and vision during telephone visit type  Cognitive Function: 6CIT Screen 02/23/2019  What Year? 0 points  What month? 0 points  What time? 0 points  Count back from 20 0 points  Months in reverse 0 points  Repeat phrase 0 points  Total Score 0   (Normal:0-7, Significant for Dysfunction: >8)  Normal Cognitive Function Screening: Yes   Immunization & Health Maintenance Record Immunization History  Administered Date(s) Administered  . Influenza, High Dose Seasonal PF 03/28/2016, 03/07/2017, 04/01/2018  . Influenza,inj,Quad PF,6+ Mos 03/23/2013, 03/26/2014, 03/17/2015  . Pneumococcal Conjugate-13 06/08/2013  . Pneumococcal Polysaccharide-23 04/03/2008  . Tdap 03/13/2011, 12/26/2018    Health Maintenance  Topic Date Due  . INFLUENZA VACCINE  01/02/2019  . COLONOSCOPY  04/07/2019  . HEMOGLOBIN A1C  07/22/2019  . OPHTHALMOLOGY EXAM  08/03/2019  . FOOT EXAM  01/27/2020  . TETANUS/TDAP  12/25/2028  . PNA vac Low Risk Adult  Completed       Assessment  This is a routine wellness examination for Joe [name].  Health Maintenance: Due or Overdue Health Maintenance Due  Topic Date Due  . INFLUENZA VACCINE  01/02/2019    Joe [name] does not need a referral for Community Assistance: Care Management:   no Social Work:    no Prescription Assistance:  no Nutrition/Diabetes Education:  no   Plan:  Personalized Goals Goals Addressed            This Visit's Progress   . DIET - EAT MORE FRUITS AND VEGETABLES        Personalized Health Maintenance & Screening Recommendations  Influenza vaccine Shingles vaccine  Lung Cancer Screening Recommended: no (Low Dose CT Chest recommended if Age 39-80 years, 30  pack-year currently smoking OR have quit w/in past 15 years) Hepatitis C Screening recommended: no HIV Screening recommended: no  Advanced Directives: Written information was not prepared per patient's request.  Referrals & Orders No orders of the defined types were placed in this encounter.   Follow-up Plan . Follow-up with Baruch Gouty, FNP as planned . Consider Flu and Shingles vaccines at your next visit with your PCP   I have personally reviewed and noted the following in the patient's chart:   . Medical and social history . Use of alcohol, tobacco or illicit drugs  . Current medications and supplements . Functional ability and status . Nutritional status . Physical activity . Advanced directives . List of other physicians . Hospitalizations, surgeries, and ER visits in previous 12 months . Vitals . Screenings to include cognitive, depression, and falls . Referrals and appointments  In addition, I have reviewed and discussed with Joe [name] certain preventive protocols, quality metrics, and best practice recommendations. A written personalized care plan for preventive services as well as general preventive health recommendations is available and can be mailed to the patient at his request.      Marylin Crosby, LPN  [id number]

## 2019-02-23 NOTE — Patient Instructions (Signed)
Preventive Care 75 Years and Older, Male Preventive care refers to lifestyle choices and visits with your health care provider that can promote health and wellness. This includes:  A yearly physical exam. This is also called an annual well check.  Regular dental and eye exams.  Immunizations.  Screening for certain conditions.  Healthy lifestyle choices, such as diet and exercise. What can I expect for my preventive care visit? Physical exam Your health care provider will check:  Height and weight. These may be used to calculate body mass index (BMI), which is a measurement that tells if you are at a healthy weight.  Heart rate and blood pressure.  Your skin for abnormal spots. Counseling Your health care provider may ask you questions about:  Alcohol, tobacco, and drug use.  Emotional well-being.  Home and relationship well-being.  Sexual activity.  Eating habits.  History of falls.  Memory and ability to understand (cognition).  Work and work Statistician. What immunizations do I need?  Influenza (flu) vaccine  This is recommended every year. Tetanus, diphtheria, and pertussis (Tdap) vaccine  You may need a Td booster every 10 years. Varicella (chickenpox) vaccine  You may need this vaccine if you have not already been vaccinated. Zoster (shingles) vaccine  You may need this after age 50. Pneumococcal conjugate (PCV13) vaccine  One dose is recommended after age 24. Pneumococcal polysaccharide (PPSV23) vaccine  One dose is recommended after age 33. Measles, mumps, and rubella (MMR) vaccine  You may need at least one dose of MMR if you were born in 1957 or later. You may also need a second dose. Meningococcal conjugate (MenACWY) vaccine  You may need this if you have certain conditions. Hepatitis A vaccine  You may need this if you have certain conditions or if you travel or work in places where you may be exposed to hepatitis A. Hepatitis B vaccine   You may need this if you have certain conditions or if you travel or work in places where you may be exposed to hepatitis B. Haemophilus influenzae type b (Hib) vaccine  You may need this if you have certain conditions. You may receive vaccines as individual doses or as more than one vaccine together in one shot (combination vaccines). Talk with your health care provider about the risks and benefits of combination vaccines. What tests do I need? Blood tests  Lipid and cholesterol levels. These may be checked every 5 years, or more frequently depending on your overall health.  Hepatitis C test.  Hepatitis B test. Screening  Lung cancer screening. You may have this screening every year starting at age 74 if you have a 30-pack-year history of smoking and currently smoke or have quit within the past 15 years.  Colorectal cancer screening. All adults should have this screening starting at age 57 and continuing until age 54. Your health care provider may recommend screening at age 47 if you are at increased risk. You will have tests every 1-10 years, depending on your results and the type of screening test.  Prostate cancer screening. Recommendations will vary depending on your family history and other risks.  Diabetes screening. This is done by checking your blood sugar (glucose) after you have not eaten for a while (fasting). You may have this done every 1-3 years.  Abdominal aortic aneurysm (AAA) screening. You may need this if you are a current or former smoker.  Sexually transmitted disease (STD) testing. Follow these instructions at home: Eating and drinking  Eat  a diet that includes fresh fruits and vegetables, whole grains, lean protein, and low-fat dairy products. Limit your intake of foods with high amounts of sugar, saturated fats, and salt.  Take vitamin and mineral supplements as recommended by your health care provider.  Do not drink alcohol if your health care provider  tells you not to drink.  If you drink alcohol: ? Limit how much you have to 0-2 drinks a day. ? Be aware of how much alcohol is in your drink. In the [place] one drink equals one 12 oz bottle of beer (355 mL), one 5 oz glass of wine (148 mL), or one 1 oz glass of hard liquor (44 mL). Lifestyle  Take daily care of your teeth and gums.  Stay active. Exercise for at least 30 minutes on 5 or more days each week.  Do not use any products that contain nicotine or tobacco, such as cigarettes, e-cigarettes, and chewing tobacco. If you need help quitting, ask your health care provider.  If you are sexually active, practice safe sex. Use a condom or other form of protection to prevent STIs (sexually transmitted infections).  Talk with your health care provider about taking a low-dose aspirin or statin. What's next?  Visit your health care provider once a year for a well check visit.  Ask your health care provider how often you should have your eyes and teeth checked.  Stay up to date on all vaccines. This information is not intended to replace advice given to you by your health care provider. Make sure you discuss any questions you have with your health care provider. Document Released: 06/16/2015 Document Revised: 05/14/2018 Document Reviewed: 05/14/2018 Elsevier Patient Education  2020 [employer]  

## 2019-03-15 ENCOUNTER — Other Ambulatory Visit: Payer: Self-pay

## 2019-03-16 ENCOUNTER — Ambulatory Visit (INDEPENDENT_AMBULATORY_CARE_PROVIDER_SITE_OTHER): Payer: Medicare Other

## 2019-03-16 DIAGNOSIS — Z23 Encounter for immunization: Secondary | ICD-10-CM

## 2019-03-24 ENCOUNTER — Telehealth: Payer: Self-pay | Admitting: Family Medicine

## 2019-03-24 NOTE — Telephone Encounter (Signed)
appt made for ear wash

## 2019-04-06 ENCOUNTER — Other Ambulatory Visit: Payer: Self-pay

## 2019-04-07 ENCOUNTER — Encounter: Payer: Self-pay | Admitting: Family Medicine

## 2019-04-07 ENCOUNTER — Ambulatory Visit (INDEPENDENT_AMBULATORY_CARE_PROVIDER_SITE_OTHER): Payer: Medicare Other | Admitting: Family Medicine

## 2019-04-07 VITALS — BP 122/63 | HR 66 | Temp 98.7°F | Resp 20 | Ht 69.0 in | Wt 168.0 lb

## 2019-04-07 DIAGNOSIS — H66011 Acute suppurative otitis media with spontaneous rupture of ear drum, right ear: Secondary | ICD-10-CM

## 2019-04-07 DIAGNOSIS — H7393 Unspecified disorder of tympanic membrane, bilateral: Secondary | ICD-10-CM | POA: Diagnosis not present

## 2019-04-07 MED ORDER — CIPROFLOXACIN HCL 500 MG PO TABS: 500.0000 mg | ORAL_TABLET | Freq: Two times a day (BID) | ORAL | 0 refills | Status: AC

## 2019-04-07 NOTE — Progress Notes (Signed)
Subjective:  [name], male    DOB: [birth year], 78 y.o.   MRN: [id number]  Patient Care Team: Baruch Gouty, FNP as PCP - General (Family Medicine) Irine Seal, MD (Urology) Gatha Mayer, MD (Gastroenterology) Minus Breeding, MD as Consulting Physician (Cardiology) Alanda Slim Neena Rhymes, MD as Consulting Physician (Ophthalmology)   Chief Complaint:  ear wash   HPI: Joe [name] is a 78 y.o. male presenting on 04/07/2019 for ear wash   Pt presents today to have ears cleaned. States he has wax build up. Wife states she has been flushing them out at home and has had cerumen return. Pt states he has pain and tenderness in both ears. States the right is worse than the left. States he has an increase in hearing loss in the right ear also. No fever, chills, weakness, headaches, or confusion. No neck pain or stiffness. He denies drainage from the ears.    Relevant past medical, surgical, family, and social history reviewed and updated as indicated.  Allergies and medications reviewed and updated. Date reviewed: Chart in Epic.   Past Medical History:  Diagnosis Date  . Arthritis   . Cataract   . Chest pain at rest 05/26/2016  . Colon polyps   . Diabetes mellitus without complication ([place])   . Dyslipidemia   . ESOPHAGITIS, REFLUX 07/09/2004   Qualifier: Diagnosis of  By: Hardin Negus CMA (AAMA), [place]    . Hiatal hernia   . HOH (hard of hearing)    has bilateral Hearing aids  . Hypertension   . Irritable bowel syndrome   . Kidney stone 1970  . Pneumonia   . Tick bites    took 5 rounds of Doxycycline this summer    Past Surgical History:  Procedure Laterality Date  . COLONOSCOPY    . ESOPHAGEAL DILATION  1996  . ESOPHAGEAL DILATION  2003  . FLEXIBLE SIGMOIDOSCOPY    . HEMICOLECTOMY  08/2005   Right  . KNEE SURGERY Right   . TONSILLECTOMY      Social History   Socioeconomic History  . Marital status: Married    Spouse name: [name]  . Number  of children: 1  . Years of education: 25  . Highest education level: Bachelor's degree (e.g., BA, AB, BS)  Occupational History  . Occupation: Retired Tour manager  . Financial resource strain: Not hard at all  . Food insecurity    Worry: Never true    Inability: Never true  . Transportation needs    Medical: No    Non-medical: No  Tobacco Use  . Smoking status: Former Smoker    Packs/day: 2.00    Types: Cigarettes    Start date: 06/03/1954    Quit date: 08/24/1984    Years since quitting: 34.6  . Smokeless tobacco: Never Used  . Tobacco comment: Has not used to tobacco products for the past 20 years  Substance and Sexual Activity  . Alcohol use: Yes    Alcohol/week: 7.0 standard drinks    Types: 7 Standard drinks or equivalent per week    Comment: Bourbon at night when he sits on the deck  . Drug use: No  . Sexual activity: Yes    Partners: Female  Lifestyle  . Physical activity    Days per week: 3 days    Minutes per session: 30 min  . Stress: Not at all  Relationships  . Social Herbalist on  phone: More than three times a week    Gets together: More than three times a week    Attends religious service: Never    Active member of club or organization: No    Attends meetings of clubs or organizations: Never    Relationship status: Married  . Intimate partner violence    Fear of current or ex partner: No    Emotionally abused: No    Physically abused: No    Forced sexual activity: No  Other Topics Concern  . 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 aspirin 81 MG tablet Take 81 mg by mouth daily.    . Blood Glucose Monitoring Suppl w/Device KIT Test BS BID and PRN dx E11.9. Give test strips and lancets to match machine given  . cholecalciferol (VITAMIN D) 1000 UNITS tablet Take 2,000 Units by mouth daily.   . Cinnamon 500 MG TABS  Take 1,000 tablets by mouth 2 (two) times daily.  Marland Kitchen doxylamine, Sleep, (UNISOM) 25 MG tablet Take 25 mg by mouth at bedtime as needed for sleep.   . empagliflozin (JARDIANCE) 25 MG TABS tablet Take 25 mg by mouth daily.  Marland Kitchen esomeprazole (NEXIUM) 40 MG capsule TAKE (1) CAPSULE DAILY  . glucose blood (ONE TOUCH ULTRA TEST) test strip Check BS qid  Dx: E11.9  . insulin degludec (TRESIBA FLEXTOUCH) 100 UNIT/ML SOPN FlexTouch Pen Inject 0.06-0.2 mLs (6-20 Units total) into the skin daily at 10 pm.  . Insulin Pen Needle (PEN NEEDLES) 32G X 5 MM MISC 1 Device by Does not apply route daily.  . metFORMIN (GLUCOPHAGE-XR) 500 MG 24 hr tablet Take 2 tablets (1,000 mg total) by mouth 2 (two) times daily with a meal. (Patient taking differently: Take 1,000 mg by mouth. Taking 2 tabs every am and 1 tab at night)  . mupirocin ointment (BACTROBAN) 2 % Apply 1 application topically 2 (two) times daily.  . Probiotic Product (PROBIOTIC PO) Take 1 tablet by mouth daily.  . simvastatin (ZOCOR) 40 MG tablet Take 1 tablet (40 mg total) by mouth every evening.  . sitaGLIPtin (JANUVIA) 100 MG tablet Take 1 tablet (100 mg total) by mouth daily.  Marland Kitchen triamcinolone ointment (KENALOG) 0.5 % APPLY TO AFFECTED AREAS TWICE A DAY  . ciprofloxacin (CIPRO) 500 MG tablet Take 1 tablet (500 mg total) by mouth 2 (two) times daily for 7 days.  Marland Kitchen olmesartan-hydrochlorothiazide (BENICAR HCT) 40-12.5 MG tablet Take 0.5 tablets by mouth daily.  . [DISCONTINUED] amoxicillin-clavulanate (AUGMENTIN) 875-125 MG tablet Take 1 tablet by mouth 2 (two) times daily.  . [DISCONTINUED] sulfamethoxazole-trimethoprim (BACTRIM DS) 800-160 MG tablet Take 1 tablet by mouth 2 (two) times daily.   No facility-administered encounter medications on file as of 04/07/2019.     Allergies  Allergen Reactions  . Ace Inhibitors Cough  . Trazamine [Trazodone & Diet Manage Prod] Other (See Comments)    nightmares    Review of Systems  Constitutional: Negative  for activity change, appetite change, chills, diaphoresis, fatigue, fever and unexpected weight change.  HENT: Positive for ear pain and hearing loss. Negative for congestion, facial swelling, postnasal drip and rhinorrhea.   Eyes: Negative.   Respiratory: Negative for cough, chest tightness and shortness of breath.   Cardiovascular: Negative for chest pain, palpitations and leg swelling.  Gastrointestinal: Negative for blood in stool, constipation, diarrhea, nausea and vomiting.  Endocrine: Negative.   Genitourinary: Negative for dysuria, frequency and urgency.  Musculoskeletal: Negative for arthralgias, myalgias, neck pain and neck stiffness.  Skin: Negative.   Allergic/Immunologic: Negative.   Neurological: Negative for dizziness, tremors, seizures, syncope, facial asymmetry, speech difficulty, weakness, light-headedness, numbness and headaches.  Hematological: Negative.   Psychiatric/Behavioral: Negative for confusion, hallucinations, sleep disturbance and suicidal ideas.  All other systems reviewed and are negative.       Objective:  BP 122/63   Pulse 66   Temp 98.7 F (37.1 C)   Resp 20   Ht 5' 9"  (1.753 m)   Wt 168 lb (76.2 kg)   SpO2 100%   BMI 24.81 kg/m    Wt Readings from Last 3 Encounters:  04/07/19 168 lb (76.2 kg)  02/16/19 170 lb (77.1 kg)  02/09/19 168 lb (76.2 kg)    Physical Exam Vitals signs and nursing note reviewed.  Constitutional:      General: He is not in acute distress.    Appearance: Normal appearance. He is well-developed, well-groomed and normal weight. He is not ill-appearing, toxic-appearing or diaphoretic.  HENT:     Head: Normocephalic and atraumatic.     Jaw: There is normal jaw occlusion.     Right Ear: Ear canal and external ear normal. Decreased hearing noted. Tenderness present. There is no impacted cerumen. No mastoid tenderness. Tympanic membrane is perforated and erythematous.     Left Ear: Ear canal and external ear normal.  Decreased hearing noted. Tenderness present. There is no impacted cerumen. No mastoid tenderness. Tympanic membrane is erythematous and bulging.     Ears:      Comments: Right ear with white discolored mass with TM rupture. White pearly mass to left TM    Nose: Nose normal.     Mouth/Throat:     Lips: Pink.     Mouth: Mucous membranes are moist.     Pharynx: Oropharynx is clear. Uvula midline.  Eyes:     General: Lids are normal.     Extraocular Movements: Extraocular movements intact.     Conjunctiva/sclera: Conjunctivae normal.     Pupils: Pupils are equal, round, and reactive to light.  Neck:     Musculoskeletal: Normal range of motion and neck supple.     Thyroid: No thyroid mass, thyromegaly or thyroid tenderness.     Vascular: No carotid bruit or JVD.     Trachea: Trachea and phonation normal.  Cardiovascular:     Rate and Rhythm: Normal rate and regular rhythm.     Chest Wall: PMI is not displaced.     Pulses: Normal pulses.     Heart sounds: Normal heart sounds. No murmur. No friction rub. No gallop.   Pulmonary:     Effort: Pulmonary effort is normal. No respiratory distress.     Breath sounds: Normal breath sounds. No wheezing.  Abdominal:     General: Bowel sounds are normal. There is no distension or abdominal bruit.     Palpations: Abdomen is soft. There is no hepatomegaly or splenomegaly.     Tenderness: There is no abdominal tenderness. There is no right CVA tenderness or left CVA tenderness.     Hernia: No hernia is present.  Musculoskeletal: Normal range of motion.     Right lower leg: No edema.     Left lower leg: No edema.     Comments: Mild kyphosis  Lymphadenopathy:     Cervical: No cervical adenopathy.  Skin:    General:  Skin is warm and dry.     Capillary Refill: Capillary refill takes less than 2 seconds.     Coloration: Skin is not cyanotic, jaundiced or pale.     Findings: No rash.  Neurological:     General: No focal deficit present.     Mental  Status: He is alert and oriented to person, place, and time.     Cranial Nerves: Cranial nerves are intact. No cranial nerve deficit.     Sensory: Sensation is intact. No sensory deficit.     Motor: Motor function is intact. No weakness.     Coordination: Coordination is intact. Coordination normal.     Gait: Gait is intact. Gait normal.     Deep Tendon Reflexes: Reflexes are normal and symmetric. Reflexes normal.  Psychiatric:        Attention and Perception: Attention and perception normal.        Mood and Affect: Mood and affect normal.        Speech: Speech normal.        Behavior: Behavior normal. Behavior is cooperative.        Thought Content: Thought content normal.        Cognition and Memory: Cognition and memory normal.        Judgment: Judgment normal.     Results for orders placed or performed in visit on 01/19/19  Fecal occult blood, imunochemical   Specimen: Stool   ST  Result Value Ref Range   Fecal Occult Bld Negative Negative  Bayer DCA Hb A1c Waived  Result Value Ref Range   HB A1C (BAYER DCA - WAIVED) 8.5 (H) <7.0 %  CMP14+EGFR  Result Value Ref Range   Glucose 107 (H) 65 - 99 mg/dL   BUN 14 8 - 27 mg/dL   Creatinine, Ser 1.39 (H) 0.76 - 1.27 mg/dL   GFR calc non Af Amer 49 (L) >59 mL/min/1.73   GFR calc Af Amer 56 (L) >59 mL/min/1.73   BUN/Creatinine Ratio 10 10 - 24   Sodium 138 134 - 144 mmol/L   Potassium 4.2 3.5 - 5.2 mmol/L   Chloride 102 96 - 106 mmol/L   CO2 22 20 - 29 mmol/L   Calcium 9.2 8.6 - 10.2 mg/dL   Total Protein 6.4 6.0 - 8.5 g/dL   Albumin 4.3 3.7 - 4.7 g/dL   Globulin, Total 2.1 1.5 - 4.5 g/dL   Albumin/Globulin Ratio 2.0 1.2 - 2.2   Bilirubin Total 0.6 0.0 - 1.2 mg/dL   Alkaline Phosphatase 58 39 - 117 IU/L   AST 17 0 - 40 IU/L   ALT 14 0 - 44 IU/L  Lipid panel  Result Value Ref Range   Cholesterol, Total 126 100 - 199 mg/dL   Triglycerides 110 0 - 149 mg/dL   HDL 49 >39 mg/dL   VLDL Cholesterol Cal 22 5 - 40 mg/dL   LDL  Calculated 55 0 - 99 mg/dL   Chol/HDL Ratio 2.6 0.0 - 5.0 ratio       Pertinent labs & imaging results that were available during my care of the patient were reviewed by me and considered in my medical decision making.  Assessment & Plan:  Joe [name] was seen today for ear wash.  Diagnoses and all orders for this visit:  Abnormal tympanic membrane of both ears Right ear with white discolored mass with TM rupture. White pearly mass to left TM. Concerning for cholesteatoma. Will refer to ENT for evaluation and treatment.  -  Ambulatory referral to ENT  Non-recurrent acute suppurative otitis media of right ear with spontaneous rupture of tympanic membrane -     ciprofloxacin (CIPRO) 500 MG tablet; Take 1 tablet (500 mg total) by mouth 2 (two) times daily for 7 days. -     Ambulatory referral to ENT     Continue all other maintenance medications.  Follow up plan: Return if symptoms worsen or fail to improve.  Continue healthy lifestyle choices, including diet (rich in fruits, vegetables, and lean proteins, and low in salt and simple carbohydrates) and exercise (at least 30 minutes of moderate physical activity daily).  The above assessment and management plan was discussed with the patient. The patient verbalized understanding of and has agreed to the management plan. Patient is aware to call the clinic if they develop any new symptoms or if symptoms persist or worsen. Patient is aware when to return to the clinic for a follow-up visit. Patient educated on when it is appropriate to go to the emergency department.   Monia Pouch, FNP-C [facility] [phone]

## 2019-04-14 ENCOUNTER — Telehealth: Payer: Self-pay | Admitting: Family Medicine

## 2019-04-14 DIAGNOSIS — H7393 Unspecified disorder of tympanic membrane, bilateral: Secondary | ICD-10-CM

## 2019-04-14 NOTE — Telephone Encounter (Signed)
Patient would like referral to ENT to [facility].

## 2019-04-25 ENCOUNTER — Encounter: Payer: Self-pay | Admitting: Internal Medicine

## 2019-04-27 ENCOUNTER — Other Ambulatory Visit: Payer: Self-pay | Admitting: Family Medicine

## 2019-05-03 ENCOUNTER — Ambulatory Visit (INDEPENDENT_AMBULATORY_CARE_PROVIDER_SITE_OTHER): Payer: Medicare Other | Admitting: Family Medicine

## 2019-05-03 ENCOUNTER — Encounter: Payer: Self-pay | Admitting: Family Medicine

## 2019-05-03 ENCOUNTER — Other Ambulatory Visit: Payer: Self-pay

## 2019-05-03 VITALS — BP 134/72 | HR 63 | Temp 97.8°F | Resp 20 | Ht 69.0 in | Wt 167.0 lb

## 2019-05-03 DIAGNOSIS — I152 Hypertension secondary to endocrine disorders: Secondary | ICD-10-CM

## 2019-05-03 DIAGNOSIS — E1169 Type 2 diabetes mellitus with other specified complication: Secondary | ICD-10-CM

## 2019-05-03 DIAGNOSIS — E1159 Type 2 diabetes mellitus with other circulatory complications: Secondary | ICD-10-CM | POA: Diagnosis not present

## 2019-05-03 DIAGNOSIS — K219 Gastro-esophageal reflux disease without esophagitis: Secondary | ICD-10-CM

## 2019-05-03 DIAGNOSIS — E1140 Type 2 diabetes mellitus with diabetic neuropathy, unspecified: Secondary | ICD-10-CM

## 2019-05-03 DIAGNOSIS — E785 Hyperlipidemia, unspecified: Secondary | ICD-10-CM

## 2019-05-03 DIAGNOSIS — Z794 Long term (current) use of insulin: Secondary | ICD-10-CM

## 2019-05-03 DIAGNOSIS — I10 Essential (primary) hypertension: Secondary | ICD-10-CM

## 2019-05-03 LAB — BAYER DCA HB A1C WAIVED: HB A1C (BAYER DCA - WAIVED): 6.7 % (ref <7.0)

## 2019-05-03 NOTE — Patient Instructions (Signed)

## 2019-05-03 NOTE — Progress Notes (Signed)
Subjective:  [name], male    DOB: 09/15/1940, 78 y.o.   MRN: [id number]  Patient Care Team: Baruch Gouty, FNP as PCP - General (Family Medicine) Irine Seal, MD (Urology) Gatha Mayer, MD (Gastroenterology) Minus Breeding, MD as Consulting Physician (Cardiology) Alanda Slim Neena Rhymes, MD as Consulting Physician (Ophthalmology)   Chief Complaint:  Medical Management of Chronic Issues (3 mo ), Diabetes, Hypertension, and Hyperlipidemia   HPI: Joe [name] is a 78 y.o. male presenting on 05/03/2019 for Medical Management of Chronic Issues (3 mo ), Diabetes, Hypertension, and Hyperlipidemia   1. Type 2 diabetes mellitus with diabetic neuropathy, with long-term current use of insulin (HCC) Pt presents for follow up evaluation of Type 2 diabetes mellitus.  Current symptoms include none. Patient denies foot ulcerations, hyperglycemia, hypoglycemia , increased appetite, nausea, paresthesia of the feet, polydipsia, polyuria, visual disturbances, vomiting and weight loss.  Current diabetic medications include metformin, jardiance, tresiba, januvia Compliant with meds - Yes  Current monitoring regimen: home blood tests - 2-3 times daily Home blood sugar records: trend: fluctuating a bit Any episodes of hypoglycemia? no  Known diabetic complications: nephropathy and cardiovascular disease Cardiovascular risk factors: advanced age (older than 11 for men, 21 for women), diabetes mellitus, dyslipidemia, hypertension, male gender and microalbuminuria Eye exam current (within one year): yes Podiatry yearly?  No Weight trend: stable Current diet: in general, a "healthy" diet   Current exercise: walking  PNA Vaccine UTD?  Yes Hep B Vaccine?  Yes Tdap Vaccine UTD?  Yes Urine microalbumin UTD? Yes  Is He on ACE inhibitor or angiotensin II receptor blocker?  Yes, olmesartan  Is He on statin? Yes simvastatin Is He on ASA 81 mg daily?  Yes    2. Hypertension  associated with diabetes ([place]) Complaint with meds - Yes Current Medications - Benicar Checking BP at home ranging 130/70 Exercising Regularly - Yes Watching Salt intake - Yes Pertinent ROS:  Headache - No Fatigue - No Visual Disturbances - No Chest pain - No Dyspnea - No Palpitations - No LE edema - No They report good compliance with medications and can restate their regimen by memory. No medication side effects.  Family, social, and smoking history reviewed.   BP Readings from Last 3 Encounters:  05/03/19 134/72  04/07/19 122/63  02/16/19 129/70   CMP Latest Ref Rng & Units 01/19/2019 10/02/2018 04/01/2018  Glucose 65 - 99 mg/dL 107(H) 99 108(H)  BUN 8 - 27 mg/dL 14 20 23   Creatinine 0.76 - 1.27 mg/dL 1.39(H) 1.36(H) 1.54(H)  Sodium 134 - 144 mmol/L 138 142 140  Potassium 3.5 - 5.2 mmol/L 4.2 4.3 4.4  Chloride 96 - 106 mmol/L 102 106 102  CO2 20 - 29 mmol/L 22 22 22   Calcium 8.6 - 10.2 mg/dL 9.2 9.2 9.2  Total Protein 6.0 - 8.5 g/dL 6.4 6.5 6.7  Total Bilirubin 0.0 - 1.2 mg/dL 0.6 0.5 0.6  Alkaline Phos 39 - 117 IU/L 58 61 57  AST 0 - 40 IU/L 17 12 19   ALT 0 - 44 IU/L 14 13 16       3. Hyperlipidemia associated with type 2 diabetes mellitus (HCC) Compliant with medications - Yes Current medications - simvastatin Side effects from medications - No Diet - generally healthy Exercise - walks daily  Lab Results  Component Value Date   CHOL 126 01/19/2019   HDL 49 01/19/2019   LDLCALC 55 01/19/2019   TRIG 110 01/19/2019  CHOLHDL 2.6 01/19/2019     Family and personal medical history reviewed. Smoking and ETOH history reviewed.    4. Gastroesophageal reflux disease without esophagitis Compliant with medications - Yes Current medications - Nexium Adverse side effects - No Cough - No Sore throat - No Voice change - No Hemoptysis - No Dysphagia or dyspepsia - No Water brash - No Red Flags (weight loss, hematochezia, melena, weight loss, early satiety,  fevers, odynophagia, or persistent vomiting) - No      Relevant past medical, surgical, family, and social history reviewed and updated as indicated.  Allergies and medications reviewed and updated. Date reviewed: Chart in Epic.   Past Medical History:  Diagnosis Date   Arthritis    Cataract    Chest pain at rest 05/26/2016   Colon polyps    Diabetes mellitus without complication ([place])    Dyslipidemia    ESOPHAGITIS, REFLUX 07/09/2004   Qualifier: Diagnosis of  By: Hardin Negus CMA Deborra Medina), Stephanie     Hiatal hernia    HOH (hard of hearing)    has bilateral Hearing aids   Hypertension    Irritable bowel syndrome    Kidney stone 1970   Pneumonia    Tick bites    took 5 rounds of Doxycycline this summer    Past Surgical History:  Procedure Laterality Date   COLONOSCOPY     ESOPHAGEAL DILATION  1996   ESOPHAGEAL DILATION  2003   FLEXIBLE SIGMOIDOSCOPY     HEMICOLECTOMY  08/2005   Right   KNEE SURGERY Right    TONSILLECTOMY      Social History   Socioeconomic History   Marital status: Married    Spouse name: [name]   Number of children: 1   Years of education: 16   Highest education level: Bachelor's degree (e.g., BA, AB, BS)  Occupational History   Occupation: Retired Scientist, research (physical sciences) strain: Not hard at [employer]    Worry: Never true    Inability: Never true   Transportation needs    Medical: No    Non-medical: No  Tobacco Use   Smoking status: Former Smoker    Packs/day: 2.00    Types: Cigarettes    Start date: 06/03/1954    Quit date: 08/24/1984    Years since quitting: 34.7   Smokeless tobacco: Never Used   Tobacco comment: Has not used to tobacco products for the past 20 years  Substance and Sexual Activity   Alcohol use: Yes    Alcohol/week: 7.0 standard drinks    Types: 7 Standard drinks or equivalent per week    Comment: [place] at night when he sits on the deck   Drug use:  No   Sexual activity: Yes    Partners: Female  Lifestyle   Physical activity    Days per week: 3 days    Minutes per session: 30 min   Stress: Not at all  Relationships   Social connections    Talks on phone: More than three times a week    Gets together: More than three times a week    Attends religious service: Never    Active member of club or organization: No    Attends meetings of clubs or organizations: Never    Relationship status: Married   Intimate partner violence    Fear of current or ex partner: No    Emotionally abused: No    Physically abused: No  Forced sexual activity: No  Other Topics Concern   Not on file  Social History Narrative   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     Blood Glucose Monitoring Suppl w/Device KIT Test BS BID and PRN dx E11.9. Give test strips and lancets to match machine given   cholecalciferol (VITAMIN D) 1000 UNITS tablet Take 2,000 Units by mouth daily.    Cinnamon 500 MG TABS Take 1,000 tablets by mouth 2 (two) times daily.   doxylamine, Sleep, (UNISOM) 25 MG tablet Take 25 mg by mouth at bedtime as needed for sleep.    empagliflozin (JARDIANCE) 25 MG TABS tablet Take 25 mg by mouth daily.   esomeprazole (NEXIUM) 40 MG capsule TAKE (1) CAPSULE DAILY   glucose blood (ONETOUCH ULTRA) test strip Check blood sugar 4 times daily Dx E 11.40   insulin degludec (TRESIBA FLEXTOUCH) 100 UNIT/ML SOPN FlexTouch Pen Inject 0.06-0.2 mLs (6-20 Units total) into the skin daily at 10 pm.   Insulin Pen Needle (PEN NEEDLES) 32G X 5 MM MISC 1 Device by Does not apply route daily.   metFORMIN (GLUCOPHAGE-XR) 500 MG 24 hr tablet Take 2 tablets (1,000 mg total) by mouth 2 (two) times daily with a meal. (Patient taking differently: Take 1,000 mg by mouth. Taking 2 tabs every am and 1 tab at night)   mupirocin  ointment (BACTROBAN) 2 % Apply 1 application topically 2 (two) times daily.   olmesartan-hydrochlorothiazide (BENICAR HCT) 40-12.5 MG tablet Take 0.5 tablets by mouth daily.   Probiotic Product (PROBIOTIC PO) Take 1 tablet by mouth daily.   simvastatin (ZOCOR) 40 MG tablet Take 1 tablet (40 mg total) by mouth every evening.   sitaGLIPtin (JANUVIA) 100 MG tablet Take 1 tablet (100 mg total) by mouth daily.   triamcinolone ointment (KENALOG) 0.5 % APPLY TO AFFECTED AREAS TWICE A DAY   No facility-administered encounter medications on file as of 05/03/2019.     Allergies  Allergen Reactions   Ace Inhibitors Cough   Trazamine [Trazodone & Diet Manage Prod] Other (See Comments)    nightmares    Review of Systems  Constitutional: Negative for activity change, appetite change, chills, diaphoresis, fatigue, fever and unexpected weight change.  HENT: Positive for hearing loss (chronic). Negative for congestion, sore throat, trouble swallowing and voice change.   Eyes: Negative.  Negative for photophobia and visual disturbance.  Respiratory: Negative for cough, choking, chest tightness and shortness of breath.   Cardiovascular: Negative for chest pain, palpitations and leg swelling.  Gastrointestinal: Negative for abdominal pain, blood in stool, constipation, diarrhea, nausea and vomiting.  Endocrine: Negative.  Negative for polydipsia, polyphagia and polyuria.  Genitourinary: Negative for decreased urine volume, difficulty urinating, dysuria, frequency and urgency.  Musculoskeletal: Negative for arthralgias and myalgias.  Skin: Positive for wound (abrasion to scalp).  Allergic/Immunologic: Negative.   Neurological: Negative for dizziness, tremors, seizures, syncope, facial asymmetry, speech difficulty, weakness, light-headedness, numbness and headaches.  Hematological: Negative.   Psychiatric/Behavioral: Negative for confusion, hallucinations, sleep disturbance and suicidal ideas.  All  other systems reviewed and are negative.       Objective:  BP 134/72    Pulse 63    Temp 97.8 F (36.6 C)    Resp 20    Ht 5' 9"  (1.753 m)    Wt 167 lb (75.8 kg)    SpO2  100%    BMI 24.66 kg/m    Wt Readings from Last 3 Encounters:  05/03/19 167 lb (75.8 kg)  04/07/19 168 lb (76.2 kg)  02/16/19 170 lb (77.1 kg)    Physical Exam Vitals signs and nursing note reviewed.  Constitutional:      General: He is not in acute distress.    Appearance: Normal appearance. He is well-developed and well-groomed. He is not ill-appearing, toxic-appearing or diaphoretic.  HENT:     Head: Normocephalic and atraumatic.     Jaw: There is normal jaw occlusion.     Right Ear: Decreased hearing (chronic) noted.     Left Ear: Decreased hearing (chronic) noted.     Ears:     Comments: Bilateral hearing aids    Nose: Nose normal.     Mouth/Throat:     Lips: Pink.     Mouth: Mucous membranes are moist.     Pharynx: Oropharynx is clear. Uvula midline.  Eyes:     General: Lids are normal.     Extraocular Movements: Extraocular movements intact.     Conjunctiva/sclera: Conjunctivae normal.     Pupils: Pupils are equal, round, and reactive to light.  Neck:     Musculoskeletal: Normal range of motion and neck supple.     Thyroid: No thyroid mass, thyromegaly or thyroid tenderness.     Vascular: No carotid bruit or JVD.     Trachea: Trachea and phonation normal.  Cardiovascular:     Rate and Rhythm: Normal rate and regular rhythm.     Chest Wall: PMI is not displaced.     Pulses: Normal pulses.     Heart sounds: Normal heart sounds. No murmur. No friction rub. No gallop.   Pulmonary:     Effort: Pulmonary effort is normal. No respiratory distress.     Breath sounds: Normal breath sounds. No wheezing.  Abdominal:     General: Bowel sounds are normal. There is no distension or abdominal bruit.     Palpations: Abdomen is soft. There is no hepatomegaly or splenomegaly.     Tenderness: There is no  abdominal tenderness. There is no right CVA tenderness or left CVA tenderness.     Hernia: No hernia is present.  Musculoskeletal: Normal range of motion.     Right lower leg: No edema.     Left lower leg: No edema.     Comments: Mild kyphosis  Lymphadenopathy:     Cervical: No cervical adenopathy.  Skin:    General: Skin is warm and dry.     Capillary Refill: Capillary refill takes less than 2 seconds.     Coloration: Skin is not cyanotic, jaundiced or pale.     Findings: No rash.       Neurological:     General: No focal deficit present.     Mental Status: He is alert and oriented to person, place, and time.     Cranial Nerves: Cranial nerves are intact. No cranial nerve deficit.     Sensory: Sensation is intact. No sensory deficit.     Motor: Motor function is intact. No weakness.     Coordination: Coordination is intact. Coordination normal.     Gait: Gait is intact. Gait normal.     Deep Tendon Reflexes: Reflexes are normal and symmetric. Reflexes normal.  Psychiatric:        Attention and Perception: Attention and perception normal.        Mood and Affect: Mood and affect  normal.        Speech: Speech normal.        Behavior: Behavior normal. Behavior is cooperative.        Thought Content: Thought content normal.        Cognition and Memory: Cognition and memory normal.        Judgment: Judgment normal.     Results for orders placed or performed in visit on 01/19/19  Fecal occult blood, imunochemical   Specimen: Stool   ST  Result Value Ref Range   Fecal Occult Bld Negative Negative  Bayer DCA Hb A1c Waived  Result Value Ref Range   HB A1C (BAYER DCA - WAIVED) 8.5 (H) <7.0 %  CMP14+EGFR  Result Value Ref Range   Glucose 107 (H) 65 - 99 mg/dL   BUN 14 8 - 27 mg/dL   Creatinine, Ser 1.39 (H) 0.76 - 1.27 mg/dL   GFR calc non Af Amer 49 (L) >59 mL/min/1.73   GFR calc Af Amer 56 (L) >59 mL/min/1.73   BUN/Creatinine Ratio 10 10 - 24   Sodium 138 134 - 144 mmol/L     Potassium 4.2 3.5 - 5.2 mmol/L   Chloride 102 96 - 106 mmol/L   CO2 22 20 - 29 mmol/L   Calcium 9.2 8.6 - 10.2 mg/dL   Total Protein 6.4 6.0 - 8.5 g/dL   Albumin 4.3 3.7 - 4.7 g/dL   Globulin, Total 2.1 1.5 - 4.5 g/dL   Albumin/Globulin Ratio 2.0 1.2 - 2.2   Bilirubin Total 0.6 0.0 - 1.2 mg/dL   Alkaline Phosphatase 58 39 - 117 IU/L   AST 17 0 - 40 IU/L   ALT 14 0 - 44 IU/L  Lipid panel  Result Value Ref Range   Cholesterol, Total 126 100 - 199 mg/dL   Triglycerides 110 0 - 149 mg/dL   HDL 49 >39 mg/dL   VLDL Cholesterol Cal 22 5 - 40 mg/dL   LDL Calculated 55 0 - 99 mg/dL   Chol/HDL Ratio 2.6 0.0 - 5.0 ratio       Pertinent labs & imaging results that were available during my care of the patient were reviewed by me and considered in my medical decision making.  Assessment & Plan:  Joe [name] was seen today for medical management of chronic issues, diabetes, hypertension and hyperlipidemia.  Diagnoses and all orders for this visit:  Type 2 diabetes mellitus with diabetic neuropathy, with long-term current use of insulin (HCC) A1C 6.7. Great job. Keep up the good work. Diet and exercise discussed. Continue current regimen. Will try to increase fiber in diet to decrease loose stools. Report any continued symptoms. Labs pending.  -     CBC with Differential/Platelet -     Bayer DCA Hb A1c Waived -     Microalbumin / creatinine urine ratio  Hypertension associated with diabetes (HCC) BP well controlled. Changes were not made in regimen. Goal BP is 130/80. Pt aware to report any persistent high or low readings. DASH diet and exercise encouraged. Exercise at least 150 minutes per week and increase as tolerated. Goal BMI > 25. Stress management encouraged. Avoid nicotine and tobacco product use. Avoid excessive alcohol and NSAID's. Avoid more than 2000 mg of sodium daily. Medications as prescribed. Follow up as scheduled.  -     CBC with Differential/Platelet -     CMP14+EGFR -      Microalbumin / creatinine urine ratio  Hyperlipidemia associated with type 2 diabetes mellitus (  [place]) Diet encouraged - increase intake of fresh fruits and vegetables, increase intake of lean proteins. Bake, broil, or grill foods. Avoid fried, greasy, and fatty foods. Avoid fast foods. Increase intake of fiber-rich whole grains. Exercise encouraged - at least 150 minutes per week and advance as tolerated.  Goal BMI < 25. Continue medications as prescribed. Follow up in 3-6 months as discussed.  -     CBC with Differential/Platelet -     Lipid panel  Gastroesophageal reflux disease without esophagitis No red flags present. Diet discussed. Avoid fried, spicy, fatty, greasy, and acidic foods. Avoid caffeine, nicotine, and alcohol. Do not eat 2-3 hours before bedtime and stay upright for at least 1-2 hours after eating. Eat small frequent meals. Avoid NSAID's like motrin and aleve. Medications as prescribed. Report any new or worsening symptoms. Follow up as discussed or sooner if needed.   -     CBC with Differential/Platelet     Continue all other maintenance medications.  Follow up plan: Return in about 3 months (around 08/01/2019).  Continue healthy lifestyle choices, including diet (rich in fruits, vegetables, and lean proteins, and low in salt and simple carbohydrates) and exercise (at least 30 minutes of moderate physical activity daily).  Educational handout given for DM  The above assessment and management plan was discussed with the patient. The patient verbalized understanding of and has agreed to the management plan. Patient is aware to call the clinic if they develop any new symptoms or if symptoms persist or worsen. Patient is aware when to return to the clinic for a follow-up visit. Patient educated on when it is appropriate to go to the emergency department.   Monia Pouch, FNP-C [facility] [phone]

## 2019-05-04 LAB — LIPID PANEL
Chol/HDL Ratio: 2.9 ratio (ref 0.0–5.0)
Cholesterol, Total: 138 mg/dL (ref 100–199)
HDL: 48 mg/dL (ref >39)
LDL Chol Calc (NIH): 68 mg/dL (ref 0–99)
Triglycerides: 121 mg/dL (ref 0–149)
VLDL Cholesterol Cal: 22 mg/dL (ref 5–40)

## 2019-05-04 LAB — CBC WITH DIFFERENTIAL/PLATELET
Basophils Absolute: 0.1 10*3/uL (ref 0.0–0.2)
Basos: 1 %
EOS (ABSOLUTE): 0.5 10*3/uL — ABNORMAL HIGH (ref 0.0–0.4)
Eos: 7 %
Hematocrit: 37.1 % — ABNORMAL LOW (ref 37.5–51.0)
Hemoglobin: 13 g/dL (ref 13.0–17.7)
Immature Grans (Abs): 0 10*3/uL (ref 0.0–0.1)
Immature Granulocytes: 0 %
Lymphocytes Absolute: 1.1 10*3/uL (ref 0.7–3.1)
Lymphs: 18 %
MCH: 32.5 pg (ref 26.6–33.0)
MCHC: 35 g/dL (ref 31.5–35.7)
MCV: 93 fL (ref 79–97)
Monocytes Absolute: 0.6 10*3/uL (ref 0.1–0.9)
Monocytes: 9 %
Neutrophils Absolute: 4.2 10*3/uL (ref 1.4–7.0)
Neutrophils: 65 %
Platelets: 124 10*3/uL — ABNORMAL LOW (ref 150–450)
RBC: 4 x10E6/uL — ABNORMAL LOW (ref 4.14–5.80)
RDW: 12.3 % (ref 11.6–15.4)
WBC: 6.4 10*3/uL (ref 3.4–10.8)

## 2019-05-04 LAB — CMP14+EGFR
ALT: 11 IU/L (ref 0–44)
AST: 11 IU/L (ref 0–40)
Albumin/Globulin Ratio: 1.9 (ref 1.2–2.2)
Albumin: 4.3 g/dL (ref 3.7–4.7)
Alkaline Phosphatase: 60 IU/L (ref 39–117)
BUN/Creatinine Ratio: 14 (ref 10–24)
BUN: 19 mg/dL (ref 8–27)
Bilirubin Total: 0.4 mg/dL (ref 0.0–1.2)
CO2: 23 mmol/L (ref 20–29)
Calcium: 9.6 mg/dL (ref 8.6–10.2)
Chloride: 98 mmol/L (ref 96–106)
Creatinine, Ser: 1.34 mg/dL — ABNORMAL HIGH (ref 0.76–1.27)
GFR calc Af Amer: 59 mL/min/{1.73_m2} — ABNORMAL LOW (ref >59)
GFR calc non Af Amer: 51 mL/min/{1.73_m2} — ABNORMAL LOW (ref >59)
Globulin, Total: 2.3 g/dL (ref 1.5–4.5)
Glucose: 135 mg/dL — ABNORMAL HIGH (ref 65–99)
Potassium: 4.4 mmol/L (ref 3.5–5.2)
Sodium: 139 mmol/L (ref 134–144)
Total Protein: 6.6 g/dL (ref 6.0–8.5)

## 2019-08-05 ENCOUNTER — Ambulatory Visit: Payer: Medicare Other | Admitting: Family Medicine

## 2019-08-12 ENCOUNTER — Other Ambulatory Visit: Payer: Self-pay

## 2019-08-13 ENCOUNTER — Encounter: Payer: Self-pay | Admitting: Family Medicine

## 2019-08-13 ENCOUNTER — Ambulatory Visit: Payer: Medicare PPO | Admitting: Family Medicine

## 2019-08-13 ENCOUNTER — Other Ambulatory Visit: Payer: Self-pay | Admitting: Family Medicine

## 2019-08-13 VITALS — BP 122/62 | HR 63 | Temp 98.0°F | Ht 69.0 in | Wt 169.5 lb

## 2019-08-13 DIAGNOSIS — E1159 Type 2 diabetes mellitus with other circulatory complications: Secondary | ICD-10-CM

## 2019-08-13 DIAGNOSIS — E1140 Type 2 diabetes mellitus with diabetic neuropathy, unspecified: Secondary | ICD-10-CM

## 2019-08-13 DIAGNOSIS — M25512 Pain in left shoulder: Secondary | ICD-10-CM

## 2019-08-13 DIAGNOSIS — E785 Hyperlipidemia, unspecified: Secondary | ICD-10-CM

## 2019-08-13 DIAGNOSIS — Z794 Long term (current) use of insulin: Secondary | ICD-10-CM

## 2019-08-13 DIAGNOSIS — E1169 Type 2 diabetes mellitus with other specified complication: Secondary | ICD-10-CM

## 2019-08-13 DIAGNOSIS — I10 Essential (primary) hypertension: Secondary | ICD-10-CM | POA: Diagnosis not present

## 2019-08-13 DIAGNOSIS — I700 Atherosclerosis of aorta: Secondary | ICD-10-CM

## 2019-08-13 DIAGNOSIS — K219 Gastro-esophageal reflux disease without esophagitis: Secondary | ICD-10-CM

## 2019-08-13 DIAGNOSIS — G8929 Other chronic pain: Secondary | ICD-10-CM

## 2019-08-13 LAB — BAYER DCA HB A1C WAIVED: HB A1C (BAYER DCA - WAIVED): 8.4 % — ABNORMAL HIGH (ref <7.0)

## 2019-08-13 NOTE — Patient Instructions (Addendum)
Continue to monitor your blood sugars as we discussed and record them. Bring the log to your next appointment.  Take your medications as directed.    Goal Blood glucose:    Fasting (before meals) = 80 to 130   Within 2 hours of eating = less than 180   Understanding your Hemoglobin A1c:     Diabetes Mellitus and Nutrition    I think that you would greatly benefit from seeing a nutritionist. If this is something you are interested in, please call Dr Sykes at [phone] to schedule an appointment.   When you have diabetes (diabetes mellitus), it is very important to have healthy eating habits because your blood sugar (glucose) levels are greatly affected by what you eat and drink. Eating healthy foods in the appropriate amounts, at about the same times every day, can help you:  Control your blood glucose.  Lower your risk of heart disease.  Improve your blood pressure.  Reach or maintain a healthy weight.  Every person with diabetes is different, and each person has different needs for a meal plan. Your health care provider may recommend that you work with a diet and nutrition specialist (dietitian) to make a meal plan that is best for you. Your meal plan may vary depending on factors such as:  The calories you need.  The medicines you take.  Your weight.  Your blood glucose, blood pressure, and cholesterol levels.  Your activity level.  Other health conditions you have, such as heart or kidney disease.  How do carbohydrates affect me? Carbohydrates affect your blood glucose level more than any other type of food. Eating carbohydrates naturally increases the amount of glucose in your blood. Carbohydrate counting is a method for keeping track of how many carbohydrates you eat. Counting carbohydrates is important to keep your blood glucose at a healthy level, especially if you use insulin or take certain oral diabetes medicines. It is important to know how many  carbohydrates you can safely have in each meal. This is different for every person. Your dietitian can help you calculate how many carbohydrates you should have at each meal and for snack. Foods that contain carbohydrates include:  Bread, cereal, rice, pasta, and crackers.  Potatoes and corn.  Peas, beans, and lentils.  Milk and yogurt.  Fruit and juice.  Desserts, such as cakes, cookies, ice cream, and candy.  How does alcohol affect me? Alcohol can cause a sudden decrease in blood glucose (hypoglycemia), especially if you use insulin or take certain oral diabetes medicines. Hypoglycemia can be a life-threatening condition. Symptoms of hypoglycemia (sleepiness, dizziness, and confusion) are similar to symptoms of having too much alcohol. If your health care provider says that alcohol is safe for you, follow these guidelines:  Limit alcohol intake to no more than 1 drink per day for nonpregnant women and 2 drinks per day for men. One drink equals 12 oz of beer, 5 oz of wine, or 1 oz of hard liquor.  Do not drink on an empty stomach.  Keep yourself hydrated with water, diet soda, or unsweetened iced tea.  Keep in mind that regular soda, juice, and other mixers may contain a lot of sugar and must be counted as carbohydrates.  What are tips for following this plan?  Reading food labels  Start by checking the serving size on the label. The amount of calories, carbohydrates, fats, and other nutrients listed on the label are based on one serving of the food. Many foods   contain more than one serving per package.  Check the total grams (g) of carbohydrates in one serving. You can calculate the number of servings of carbohydrates in one serving by dividing the total carbohydrates by 15. For example, if a food has 30 g of total carbohydrates, it would be equal to 2 servings of carbohydrates.  Check the number of grams (g) of saturated and trans fats in one serving. Choose foods that have  low or no amount of these fats.  Check the number of milligrams (mg) of sodium in one serving. Most people should limit total sodium intake to less than 2,300 mg per day.  Always check the nutrition information of foods labeled as "low-fat" or "nonfat". These foods may be higher in added sugar or refined carbohydrates and should be avoided.  Talk to your dietitian to identify your daily goals for nutrients listed on the label.  Shopping  Avoid buying canned, premade, or processed foods. These foods tend to be high in fat, sodium, and added sugar.  Shop around the outside edge of the grocery store. This includes fresh fruits and vegetables, bulk grains, fresh meats, and fresh dairy.  Cooking  Use low-heat cooking methods, such as baking, instead of high-heat cooking methods like deep frying.  Cook using healthy oils, such as olive, canola, or sunflower oil.  Avoid cooking with butter, cream, or high-fat meats.  Meal planning  Eat meals and snacks regularly, preferably at the same times every day. Avoid going long periods of time without eating.  Eat foods high in fiber, such as fresh fruits, vegetables, beans, and whole grains. Talk to your dietitian about how many servings of carbohydrates you can eat at each meal.  Eat 4-6 ounces of lean protein each day, such as lean meat, chicken, fish, eggs, or tofu. 1 ounce is equal to 1 ounce of meat, chicken, or fish, 1 egg, or 1/4 cup of tofu.  Eat some foods each day that contain healthy fats, such as avocado, nuts, seeds, and fish.  Lifestyle   Check your blood glucose regularly.  Exercise at least 30 minutes 5 or more days each week, or as told by your health care provider.  Take medicines as told by your health care provider.  Do not use any products that contain nicotine or tobacco, such as cigarettes and e-cigarettes. If you need help quitting, ask your health care provider.  Work with a counselor or diabetes educator to  identify strategies to manage stress and any emotional and social challenges.   What are some questions to ask my health care provider?  Do I need to meet with a diabetes educator?  Do I need to meet with a dietitian?  What number can I call if I have questions?  When are the best times to check my blood glucose?   Where to find more information:  American Diabetes Association: diabetes.org/food-and-fitness/food  Academy of Nutrition and Dietetics: www.eatright.org/resources/health/diseases-and-conditions/diabetes  National Institute of Diabetes and Digestive and Kidney Diseases (NIH): www.niddk.nih.gov/health-information/diabetes/overview/diet-eating-physical-activity   Summary  A healthy meal plan will help you control your blood glucose and maintain a healthy lifestyle.  Working with a diet and nutrition specialist (dietitian) can help you make a meal plan that is best for you.  Keep in mind that carbohydrates and alcohol have immediate effects on your blood glucose levels. It is important to count carbohydrates and to use alcohol carefully. This information is not intended to replace advice given to you by your health care provider.   Make sure you discuss any questions you have with your health care provider. Document Released: 02/14/2005 Document Revised: 06/24/2016 Document Reviewed: 06/24/2016 Elsevier Interactive Patient Education  2018 [place] Steroid Injection A joint steroid injection is a procedure to relieve swelling and pain in a joint. Steroids are medicines that reduce inflammation. In this procedure, your health care provider uses a syringe and a needle to inject a steroid medicine into a painful and inflamed joint. A pain-relieving medicine (anesthetic) may be injected along with the steroid. In some cases, your health care provider may use an imaging technique such as ultrasound or fluoroscopy to guide the injection. Joints that are often  treated with steroid injections include the knee, shoulder, hip, and spine. These injections may also be used in the elbow, ankle, and joints of the hands or feet. You may have joint steroid injections as part of your treatment for inflammation caused by: Gout. Rheumatoid arthritis. Advanced wear-and-tear arthritis (osteoarthritis). Tendinitis. Bursitis. Joint steroid injections may be repeated, but having them too often can damage a joint or the skin over the joint. You should not have joint steroid injections less than 6 weeks apart or more than four times a year. Tell a health care provider about: Any allergies you have. All medicines you are taking, including vitamins, herbs, eye drops, creams, and over-the-counter medicines. Any problems you or family members have had with anesthetic medicines. Any blood disorders you have. Any surgeries you have had. Any medical conditions you have. Whether you are pregnant or may be pregnant. What are the risks? Generally, this is a safe treatment. However, problems may occur, including: Infection. Bleeding. Allergic reactions to medicines. Damage to the joint or tissues around the joint. Thinning of skin or loss of skin color over the joint. Temporary flushing of the face or chest. Temporary increase in pain. Temporary increase in blood sugar. Failure to relieve inflammation or pain. What happens before the treatment? You may have imaging tests of your joint. Ask your health care provider about: Changing or stopping your regular medicines. This is especially important if you are taking diabetes medicines or blood thinners. Taking medicines such as aspirin and ibuprofen. These medicines can thin your blood. Do not take these medicines unless your health care provider tells you to take them. Taking over-the-counter medicines, vitamins, herbs, and supplements. Ask your health care provider if you can drive yourself home after the procedure. What  happens during the treatment?  Your health care provider will position you for the injection and locate the injection site over your joint. The skin over the joint will be cleaned with a germ-killing soap. Your health care provider may: Spray a numbing solution (topical anesthetic) over the injection site. Inject a local anesthetic under the skin above your joint. The needle will be placed through your skin into your joint. Your health care provider may use imaging to guide the needle to the right spot for the injection. If imaging is used, a special contrast dye may be injected to confirm that the needle is in the correct location. The steroid medicine will be injected into your joint. Anesthetic may be injected along with the steroid. This may be a medicine that relieves pain for a short time (short-acting anesthetic) or for a longer time (long-acting anesthetic). The needle will be removed, and an adhesive bandage (dressing) will be placed over the injection site. The procedure may vary among health care providers and hospitals. What can I expect  after the treatment? You will be able to go home after the treatment. It is normal to feel slight flushing for a few days after the injection. After the treatment, it is common to have an increase in joint pain after the anesthetic has worn off. This may happen about an hour after a short-acting anesthetic or about 8 hours after a longer-acting anesthetic. You should begin to feel relief from joint pain and swelling after 24 to 48 hours. Follow these instructions at home: Injection site care Leave the adhesive dressing over your injection site in place until your health care provider says you can remove it. Check your injection site every day for signs of infection. Check for: Redness, swelling, or pain. Fluid or blood. Warmth. Pus or a bad smell. Activity Return to your normal activities as told by your health care provider. Ask your health  care provider what activities are safe for you. You may be asked to limit activities that put stress on the joint for a few days. Do joint exercises as told by your health care provider. Do not take baths, swim, or use a hot tub until your health care provider approves. Managing pain, stiffness, and swelling  If directed, put ice on the joint. Put ice in a plastic bag. Place a towel between your skin and the bag. Leave the ice on for 20 minutes, 2-3 times a day. Raise (elevate) your joint above the level of your heart when you are sitting or lying down. General instructions Take over-the-counter and prescription medicines only as told by your health care provider. Do not use any products that contain nicotine or tobacco, such as cigarettes, e-cigarettes, and chewing tobacco. These can delay joint healing. If you need help quitting, ask your health care provider. If you have diabetes, be aware that your blood sugar may be slightly elevated for several days after the injection. Keep all follow-up visits as told by your health care provider. This is important. Contact a health care provider if you have: Chills or a fever. Any signs of infection at your injection site. Increased pain or swelling or no relief after 2 days. Summary A joint steroid injection is a treatment to relieve pain and swelling in a joint. Steroids are medicines that reduce inflammation. Your health care provider may add an anesthetic along with the steroid. You may have joint steroid injections as part of your arthritis treatment. Joint steroid injections may be repeated, but having them too often can damage a joint or the skin over the joint. Contact your health care provider if you have a fever, chills, or signs of infection or if you get no relief from joint pain or swelling. This information is not intended to replace advice given to you by your health care provider. Make sure you discuss any questions you have with  your health care provider. Document Revised: 01/20/2018 Document Reviewed: 01/20/2018 Elsevier Patient Education  2020 [employer].

## 2019-08-13 NOTE — Progress Notes (Addendum)
Subjective:  [name], male    DOB: 1940/10/05, 79 y.o.   MRN: [id number]  Patient Care Team: Baruch Gouty, FNP as PCP - General (Family Medicine) Irine Seal, MD (Urology) Gatha Mayer, MD (Gastroenterology) Minus Breeding, MD as Consulting Physician (Cardiology) Alanda Slim, Neena Rhymes, MD as Consulting Physician (Ophthalmology)   Chief Complaint:  Medical Management of Chronic Issues, Diabetes, Diarrhea (worse when metformin increased), and Shoulder Pain (left)   HPI: Joe [name] is a 79 y.o. male presenting on 08/13/2019 for Medical Management of Chronic Issues, Diabetes, Diarrhea (worse when metformin increased), and Shoulder Pain (left)   1. Type 2 diabetes mellitus with diabetic neuropathy, with long-term current use of insulin ([place]) Patient admits to eating more fried foods, sweets, and snacks. Had to cut back on metformin due to intolerance.   2. Hyperlipidemia associated with type 2 diabetes mellitus ([place]) Patient admits to eating more fried food, sweets, and snacks. Is taking medications as prescribed and doing well.   3. Hypertension associated with diabetes ([place]) Blood pressure well controlled on Benicar. No chest pain, headaches, palpitations, leg swelling, or dizziness.   4. Gastroesophageal reflux disease without esophagitis No GI complaints today. Metformin dosage decreased due to diarrhea.     Relevant past medical, surgical, family, and social history reviewed and updated as indicated.  Allergies and medications reviewed and updated. Date reviewed: Chart in Epic.   Past Medical History:  Diagnosis Date  . Arthritis   . Cataract   . Chest pain at rest 05/26/2016  . Colon polyps   . Diabetes mellitus without complication ([place])   . Dyslipidemia   . ESOPHAGITIS, REFLUX 07/09/2004   Qualifier: Diagnosis of  By: Hardin Negus CMA (AAMA), [place]    . Hiatal hernia   . HOH (hard of hearing)    has bilateral Hearing aids  .  Hypertension   . Irritable bowel syndrome   . Kidney stone 1970  . Pneumonia   . Tick bites    took 5 rounds of Doxycycline this summer    Past Surgical History:  Procedure Laterality Date  . COLONOSCOPY    . ESOPHAGEAL DILATION  1996  . ESOPHAGEAL DILATION  2003  . FLEXIBLE SIGMOIDOSCOPY    . HEMICOLECTOMY  08/2005   Right  . KNEE SURGERY Right   . TONSILLECTOMY      Social History   Socioeconomic History  . Marital status: Married    Spouse name: [name]  . Number of children: 1  . Years of education: 24  . Highest education level: Bachelor's degree (e.g., BA, AB, BS)  Occupational History  . Occupation: Retired Pharmacist, hospital  Tobacco Use  . Smoking status: Former Smoker    Packs/day: 2.00    Types: Cigarettes    Start date: 06/03/1954    Quit date: 08/24/1984    Years since quitting: 34.9  . Smokeless tobacco: Never Used  . Tobacco comment: Has not used to tobacco products for the past 20 years  Substance and Sexual Activity  . Alcohol use: Yes    Alcohol/week: 7.0 standard drinks    Types: 7 Standard drinks or equivalent per week    Comment: Bourbon at night when he sits on the deck  . Drug use: No  . Sexual activity: Yes    Partners: Female  Other Topics Concern  . Not on file  Social History Narrative   Lives in [place] with wife, both teachers   Social Determinants of  Health   Financial Resource Strain:   . Difficulty of Paying Living Expenses:   Food Insecurity:   . Worried About Charity fundraiser in the Last Year:   . Arboriculturist in the Last Year:   Transportation Needs:   . Film/video editor (Medical):   Marland Kitchen Lack of Transportation (Non-Medical):   Physical Activity: Insufficiently Active  . Days of Exercise per Week: 3 days  . Minutes of Exercise per Session: 30 min  Stress:   . Feeling of Stress :   Social Connections:   . Frequency of Communication with Friends and Family:   . Frequency of Social Gatherings with Friends and Family:    . Attends Religious Services:   . Active Member of Clubs or Organizations:   . Attends Archivist Meetings:   Marland Kitchen Marital Status:   Intimate Partner Violence:   . Fear of Current or Ex-Partner:   . Emotionally Abused:   Marland Kitchen Physically Abused:   . Sexually Abused:     Outpatient Encounter Medications as of 08/13/2019  Medication Sig  . aspirin 81 MG tablet Take 81 mg by mouth daily.    . Blood Glucose Monitoring Suppl w/Device KIT Test BS BID and PRN dx E11.9. Give test strips and lancets to match machine given  . cholecalciferol (VITAMIN D) 1000 UNITS tablet Take 2,000 Units by mouth daily.   . Cinnamon 500 MG TABS Take 1,000 tablets by mouth 2 (two) times daily.  Marland Kitchen doxylamine, Sleep, (UNISOM) 25 MG tablet Take 25 mg by mouth at bedtime as needed for sleep.   . empagliflozin (JARDIANCE) 25 MG TABS tablet Take 25 mg by mouth daily.  Marland Kitchen esomeprazole (NEXIUM) 40 MG capsule TAKE (1) CAPSULE DAILY  . glucose blood (ONETOUCH ULTRA) test strip Check blood sugar 4 times daily Dx E 11.40  . glucose blood (ONETOUCH ULTRA) test strip   . glucose blood (ONETOUCH ULTRA) test strip   . insulin degludec (TRESIBA FLEXTOUCH) 100 UNIT/ML SOPN FlexTouch Pen Inject 0.06-0.2 mLs (6-20 Units total) into the skin daily at 10 pm.  . Insulin Pen Needle (PEN NEEDLES) 32G X 5 MM MISC 1 Device by Does not apply route daily.  . mupirocin ointment (BACTROBAN) 2 % Apply 1 application topically 2 (two) times daily.  . Probiotic Product (PROBIOTIC PO) Take 1 tablet by mouth daily.  . simvastatin (ZOCOR) 40 MG tablet Take 1 tablet (40 mg total) by mouth every evening.  . sitaGLIPtin (JANUVIA) 100 MG tablet Take 1 tablet (100 mg total) by mouth daily.  Marland Kitchen triamcinolone ointment (KENALOG) 0.5 % APPLY TO AFFECTED AREAS TWICE A DAY  . Accu-Chek Softclix Lancets lancets CHECK BLOOD SUGAR 4 TIMES A DAY OR AS DIRECTED  . metFORMIN (GLUCOPHAGE-XR) 500 MG 24 hr tablet Take 2 tablets (1,000 mg total) by mouth 2 (two) times  daily with a meal. (Patient taking differently: Take 1,000 mg by mouth. Taking 2 tabs every am and 1 tab at night)  . olmesartan-hydrochlorothiazide (BENICAR HCT) 40-12.5 MG tablet Take 0.5 tablets by mouth daily.   No facility-administered encounter medications on file as of 08/13/2019.    Allergies  Allergen Reactions  . Ace Inhibitors Cough  . Trazamine [Trazodone & Diet Manage Prod] Other (See Comments)    nightmares    Review of Systems  Constitutional: Negative for activity change, appetite change, chills, diaphoresis, fatigue, fever and unexpected weight change.  HENT: Negative.   Eyes: Negative.  Negative for photophobia and visual  disturbance.  Respiratory: Negative for cough, chest tightness and shortness of breath.   Cardiovascular: Negative for chest pain, palpitations and leg swelling.  Gastrointestinal: Negative for abdominal pain, blood in stool, constipation, diarrhea, nausea and vomiting.  Endocrine: Negative.  Negative for cold intolerance, heat intolerance, polydipsia, polyphagia and polyuria.  Genitourinary: Negative for decreased urine volume, difficulty urinating, dysuria, frequency and urgency.  Musculoskeletal: Positive for arthralgias and myalgias.  Skin: Negative.   Allergic/Immunologic: Negative.   Neurological: Negative for dizziness, tremors, seizures, syncope, facial asymmetry, speech difficulty, weakness, light-headedness, numbness and headaches.  Hematological: Negative.   Psychiatric/Behavioral: Negative for confusion, hallucinations, sleep disturbance and suicidal ideas.  All other systems reviewed and are negative.       Objective:  BP 122/62   Pulse 63   Temp 98 F (36.7 C)   Ht 5' 9"  (1.753 m)   Wt 169 lb 8 oz (76.9 kg)   SpO2 98%   BMI 25.03 kg/m    Wt Readings from Last 3 Encounters:  08/13/19 169 lb 8 oz (76.9 kg)  05/03/19 167 lb (75.8 kg)  04/07/19 168 lb (76.2 kg)    Physical Exam Vitals and nursing note reviewed.   Constitutional:      General: He is not in acute distress.    Appearance: Normal appearance. He is well-developed and well-groomed. He is not ill-appearing, toxic-appearing or diaphoretic.  HENT:     Head: Normocephalic and atraumatic.     Jaw: There is normal jaw occlusion.     Right Ear: Hearing normal.     Left Ear: Hearing normal.     Nose: Nose normal.     Mouth/Throat:     Lips: Pink.     Mouth: Mucous membranes are moist.     Pharynx: Oropharynx is clear. Uvula midline.  Eyes:     General: Lids are normal.     Extraocular Movements: Extraocular movements intact.     Conjunctiva/sclera: Conjunctivae normal.     Pupils: Pupils are equal, round, and reactive to light.  Neck:     Thyroid: No thyroid mass, thyromegaly or thyroid tenderness.     Vascular: No carotid bruit or JVD.     Trachea: Trachea and phonation normal.  Cardiovascular:     Rate and Rhythm: Normal rate and regular rhythm.     Chest Wall: PMI is not displaced.     Pulses: Normal pulses.     Heart sounds: Normal heart sounds. No murmur. No friction rub. No gallop.   Pulmonary:     Effort: Pulmonary effort is normal. No respiratory distress.     Breath sounds: Normal breath sounds. No wheezing.  Abdominal:     General: Bowel sounds are normal. There is no distension or abdominal bruit.     Palpations: Abdomen is soft. There is no hepatomegaly or splenomegaly.     Tenderness: There is no abdominal tenderness. There is no right CVA tenderness or left CVA tenderness.     Hernia: No hernia is present.  Musculoskeletal:        General: Tenderness present.     Left shoulder: Bony tenderness present. Decreased range of motion.     Cervical back: Normal range of motion and neck supple.     Right lower leg: No edema.     Left lower leg: No edema.     Comments: Anterior AC joint tenderness  Lymphadenopathy:     Cervical: No cervical adenopathy.  Skin:    General: Skin is warm  and dry.     Capillary Refill:  Capillary refill takes less than 2 seconds.     Coloration: Skin is not cyanotic, jaundiced or pale.     Findings: No rash.  Neurological:     General: No focal deficit present.     Mental Status: He is alert and oriented to person, place, and time.     Cranial Nerves: Cranial nerves are intact. No cranial nerve deficit.     Sensory: Sensation is intact. No sensory deficit.     Motor: Motor function is intact. No weakness.     Coordination: Coordination is intact. Coordination normal.     Gait: Gait is intact. Gait normal.     Deep Tendon Reflexes: Reflexes are normal and symmetric. Reflexes normal.  Psychiatric:        Attention and Perception: Attention and perception normal.        Mood and Affect: Mood and affect normal.        Speech: Speech normal.        Behavior: Behavior normal. Behavior is cooperative.        Thought Content: Thought content normal.        Cognition and Memory: Cognition and memory normal.        Judgment: Judgment normal.      Results for orders placed or performed in visit on 05/03/19  CBC with Differential/Platelet  Result Value Ref Range   WBC 6.4 3.4 - 10.8 x10E3/uL   RBC 4.00 (L) 4.14 - 5.80 x10E6/uL   Hemoglobin 13.0 13.0 - 17.7 g/dL   Hematocrit 37.1 (L) 37.5 - 51.0 %   MCV 93 79 - 97 fL   MCH 32.5 26.6 - 33.0 pg   MCHC 35.0 31.5 - 35.7 g/dL   RDW 12.3 11.6 - 15.4 %   Platelets 124 (L) 150 - 450 x10E3/uL   Neutrophils 65 Not Estab. %   Lymphs 18 Not Estab. %   Monocytes 9 Not Estab. %   Eos 7 Not Estab. %   Basos 1 Not Estab. %   Neutrophils Absolute 4.2 1.4 - 7.0 x10E3/uL   Lymphocytes Absolute 1.1 0.7 - 3.1 x10E3/uL   Monocytes Absolute 0.6 0.1 - 0.9 x10E3/uL   EOS (ABSOLUTE) 0.5 (H) 0.0 - 0.4 x10E3/uL   Basophils Absolute 0.1 0.0 - 0.2 x10E3/uL   Immature Granulocytes 0 Not Estab. %   Immature Grans (Abs) 0.0 0.0 - 0.1 x10E3/uL  CMP14+EGFR  Result Value Ref Range   Glucose 135 (H) 65 - 99 mg/dL   BUN 19 8 - 27 mg/dL    Creatinine, Ser 1.34 (H) 0.76 - 1.27 mg/dL   GFR calc non Af Amer 51 (L) >59 mL/min/1.73   GFR calc Af Amer 59 (L) >59 mL/min/1.73   BUN/Creatinine Ratio 14 10 - 24   Sodium 139 134 - 144 mmol/L   Potassium 4.4 3.5 - 5.2 mmol/L   Chloride 98 96 - 106 mmol/L   CO2 23 20 - 29 mmol/L   Calcium 9.6 8.6 - 10.2 mg/dL   Total Protein 6.6 6.0 - 8.5 g/dL   Albumin 4.3 3.7 - 4.7 g/dL   Globulin, Total 2.3 1.5 - 4.5 g/dL   Albumin/Globulin Ratio 1.9 1.2 - 2.2   Bilirubin Total 0.4 0.0 - 1.2 mg/dL   Alkaline Phosphatase 60 39 - 117 IU/L   AST 11 0 - 40 IU/L   ALT 11 0 - 44 IU/L  Lipid panel  Result Value Ref Range  Cholesterol, Total 138 100 - 199 mg/dL   Triglycerides 121 0 - 149 mg/dL   HDL 48 >39 mg/dL   VLDL Cholesterol Cal 22 5 - 40 mg/dL   LDL Chol Calc (NIH) 68 0 - 99 mg/dL   Chol/HDL Ratio 2.9 0.0 - 5.0 ratio  Bayer DCA Hb A1c Waived  Result Value Ref Range   HB A1C (BAYER DCA - WAIVED) 6.7 <7.0 %     Joint Injection/Arthrocentesis  Date/Time: 08/13/2019 2:18 PM Performed by: Baruch Gouty, FNP Authorized by: Baruch Gouty, FNP  Indications: joint swelling and pain  Body area: shoulder Joint: left shoulder Local anesthesia used: yes  Anesthesia: Local anesthesia used: yes Local Anesthetic: lidocaine spray  Sedation: Patient sedated: no  Preparation: Patient was prepped and draped in the usual sterile fashion. Needle size: 22 G Ultrasound guidance: no Approach: posterior Aspirate amount: 0 mL Methylprednisolone amount: 60 mg Lidocaine 2% amount: 4 mL Patient tolerance: patient tolerated the procedure well with no immediate complications Comments: Pts name, DOB and allergies verified prior to procedure. Verbal consent give for procedure.      Pertinent labs & imaging results that were available during my care of the patient were reviewed by me and considered in my medical decision making.  Assessment & Plan:  Isaish was seen today for medical management of  chronic issues, diabetes, diarrhea and shoulder pain.  Diagnoses and all orders for this visit:  Type 2 diabetes mellitus with diabetic neuropathy, with long-term current use of insulin ([place]) -     CBC with Differential/Platelet -     CMP14+EGFR -     Lipid panel -     Bayer DCA Hb A1c Waived -     Microalbumin / creatinine urine ratio -Encouraged healthy diet with low carbs and decreased sugar intake. A1C 9.3 today.   Hyperlipidemia associated with type 2 diabetes mellitus ([place]) -     Lipid panel -Encouraged patient to increase dietary fiber and daily water intake.  Hypertension associated with diabetes ([place]) -     CBC with Differential/Platelet -     CMP14+EGFR -     Lipid panel -Encouraged patient to keep maintaining good control of blood pressure on current medication.  Gastroesophageal reflux disease without esophagitis -     CBC with Differential/Platelet -Encouraged patient to monitor for any signs of GI bleeding.     Continue all other maintenance medications.  Follow up plan: Return in about 3 months (around 11/13/2019), or if symptoms worsen or fail to improve, for DM.  Continue healthy lifestyle choices, including diet (rich in fruits, vegetables, and lean proteins, and low in salt and simple carbohydrates) and exercise (at least 30 minutes of moderate physical activity daily).  Educational handout given for joint injection.  The above assessment and management plan was discussed with the patient. The patient verbalized understanding of and has agreed to the management plan. Patient is aware to call the clinic if they develop any new symptoms or if symptoms persist or worsen. Patient is aware when to return to the clinic for a follow-up visit. Patient educated on when it is appropriate to go to the emergency department.   Robynn Pane, FNP student [facility] [phone]  I personally was present during the history, physical exam, and  medical decision-making activities of this service and have verified that the service and findings are accurately documented in the nurse practitioner student's note.  Monia Pouch, FNP-C [facility] [phone]  [place], [place] [address] [phone]   Pt also complaining of left shoulder pain. This is chronic in nature. Shoulder injection in office today.

## 2019-08-14 LAB — CBC WITH DIFFERENTIAL/PLATELET
Basophils Absolute: 0.1 10*3/uL (ref 0.0–0.2)
Basos: 2 %
EOS (ABSOLUTE): 0.5 10*3/uL — ABNORMAL HIGH (ref 0.0–0.4)
Eos: 8 %
Hematocrit: 38.4 % (ref 37.5–51.0)
Hemoglobin: 13.1 g/dL (ref 13.0–17.7)
Immature Grans (Abs): 0 10*3/uL (ref 0.0–0.1)
Immature Granulocytes: 0 %
Lymphocytes Absolute: 1.1 10*3/uL (ref 0.7–3.1)
Lymphs: 20 %
MCH: 31.8 pg (ref 26.6–33.0)
MCHC: 34.1 g/dL (ref 31.5–35.7)
MCV: 93 fL (ref 79–97)
Monocytes Absolute: 0.5 10*3/uL (ref 0.1–0.9)
Monocytes: 9 %
Neutrophils Absolute: 3.3 10*3/uL (ref 1.4–7.0)
Neutrophils: 61 %
Platelets: 145 10*3/uL — ABNORMAL LOW (ref 150–450)
RBC: 4.12 x10E6/uL — ABNORMAL LOW (ref 4.14–5.80)
RDW: 13.2 % (ref 11.6–15.4)
WBC: 5.4 10*3/uL (ref 3.4–10.8)

## 2019-08-14 LAB — CMP14+EGFR
ALT: 10 IU/L (ref 0–44)
AST: 18 IU/L (ref 0–40)
Albumin/Globulin Ratio: 2.1 (ref 1.2–2.2)
Albumin: 4.4 g/dL (ref 3.7–4.7)
Alkaline Phosphatase: 60 IU/L (ref 39–117)
BUN/Creatinine Ratio: 14 (ref 10–24)
BUN: 17 mg/dL (ref 8–27)
Bilirubin Total: 0.6 mg/dL (ref 0.0–1.2)
CO2: 21 mmol/L (ref 20–29)
Calcium: 9.4 mg/dL (ref 8.6–10.2)
Chloride: 99 mmol/L (ref 96–106)
Creatinine, Ser: 1.21 mg/dL (ref 0.76–1.27)
GFR calc Af Amer: 66 mL/min/{1.73_m2} (ref >59)
GFR calc non Af Amer: 57 mL/min/{1.73_m2} — ABNORMAL LOW (ref >59)
Globulin, Total: 2.1 g/dL (ref 1.5–4.5)
Glucose: 120 mg/dL — ABNORMAL HIGH (ref 65–99)
Potassium: 4.2 mmol/L (ref 3.5–5.2)
Sodium: 137 mmol/L (ref 134–144)
Total Protein: 6.5 g/dL (ref 6.0–8.5)

## 2019-08-14 LAB — LIPID PANEL
Chol/HDL Ratio: 2.5 ratio (ref 0.0–5.0)
Cholesterol, Total: 108 mg/dL (ref 100–199)
HDL: 44 mg/dL (ref >39)
LDL Chol Calc (NIH): 41 mg/dL (ref 0–99)
Triglycerides: 130 mg/dL (ref 0–149)
VLDL Cholesterol Cal: 23 mg/dL (ref 5–40)

## 2019-08-14 LAB — MICROALBUMIN / CREATININE URINE RATIO
Creatinine, Urine: 73.3 mg/dL
Microalb/Creat Ratio: 7 mg/g creat (ref 0–29)
Microalbumin, Urine: 5.3 ug/mL

## 2019-08-24 ENCOUNTER — Encounter: Payer: Self-pay | Admitting: Internal Medicine

## 2019-09-17 ENCOUNTER — Encounter: Payer: Self-pay | Admitting: Family Medicine

## 2019-09-29 DIAGNOSIS — H6240 Otitis externa in other diseases classified elsewhere, unspecified ear: Secondary | ICD-10-CM | POA: Insufficient documentation

## 2019-09-29 DIAGNOSIS — B369 Superficial mycosis, unspecified: Secondary | ICD-10-CM | POA: Insufficient documentation

## 2019-10-07 ENCOUNTER — Ambulatory Visit: Payer: Medicare PPO | Admitting: Internal Medicine

## 2019-10-07 ENCOUNTER — Telehealth: Payer: Self-pay | Admitting: Internal Medicine

## 2019-10-07 ENCOUNTER — Encounter: Payer: Self-pay | Admitting: Internal Medicine

## 2019-10-07 VITALS — BP 114/60 | HR 72 | Temp 97.4°F | Ht 69.0 in | Wt 170.0 lb

## 2019-10-07 DIAGNOSIS — Z86010 Personal history of colonic polyps: Secondary | ICD-10-CM | POA: Diagnosis not present

## 2019-10-07 DIAGNOSIS — R1310 Dysphagia, unspecified: Secondary | ICD-10-CM

## 2019-10-07 DIAGNOSIS — R1319 Other dysphagia: Secondary | ICD-10-CM

## 2019-10-07 NOTE — Progress Notes (Signed)
Joe [name] 79 y.o. 19-May-1941 [id number]  Assessment & Plan:   Encounter Diagnoses  Name Primary?  . Esophageal dysphagia Yes  . Hx of adenomatous polyp of colon     EGD with esophageal dilation for dysphagia suspect esophageal stricture/ring.  Dysmotility is possible as well.  Not thinking this is cancer though always in the differential.  Though he is 17 he had a very large polyp that required surgical removal years ago so I think 1 more colonoscopy is reasonable.  That is his preference so we will proceed.  The risks and benefits as well as alternatives of endoscopic procedure(s) have been discussed and reviewed. All questions answered. The patient agrees to proceed.  CC: Janora Norlander, DO     Subjective:   Chief Complaint: Dysphagia history of colon polyp  HPI [name] is here with his wife Joe [name], because of history of colon polyps with last colonoscopy 2015 - but remote history of surgical resection of a large tubulovillous adenoma that was not able to be completely removed using colonoscope.  In addition he is having some intermittent solid food dysphagia several times a month at least.  There is a remote history of dilation.  He says PPI controls heartburn.  He has lost weight but that has been attributed to diabetes mellitus not well controlled.  Wife reports and he agrees that he snacks throughout the day.  Hemoglobin A1c will be okay and then flare back up.  Metformin causes loose stools but that is not a quality of life issue.  Wt Readings from Last 3 Encounters:  10/07/19 170 lb (77.1 kg)  08/13/19 169 lb 8 oz (76.9 kg)  05/03/19 167 lb (75.8 kg)    Allergies  Allergen Reactions  . Ace Inhibitors Cough  . Trazamine [Trazodone & Diet Manage Prod] Other (See Comments)    nightmares   Current Meds  Medication Sig  . Accu-Chek Softclix Lancets lancets CHECK BLOOD SUGAR 4 TIMES A DAY OR AS DIRECTED  . aspirin 81 MG tablet Take 81 mg by mouth daily.      . Blood Glucose Monitoring Suppl w/Device KIT Test BS BID and PRN dx E11.9. Give test strips and lancets to match machine given  . cholecalciferol (VITAMIN D) 1000 UNITS tablet Take 2,000 Units by mouth daily.   . Cinnamon 500 MG TABS Take 1,000 tablets by mouth 2 (two) times daily.  Marland Kitchen doxylamine, Sleep, (UNISOM) 25 MG tablet Take 25 mg by mouth at bedtime as needed for sleep.   Marland Kitchen esomeprazole (NEXIUM) 40 MG capsule TAKE (1) CAPSULE DAILY  . glucose blood (ONETOUCH ULTRA) test strip Check blood sugar 4 times daily Dx E 11.40  . glucose blood (ONETOUCH ULTRA) test strip   . glucose blood (ONETOUCH ULTRA) test strip   . insulin degludec (TRESIBA FLEXTOUCH) 100 UNIT/ML SOPN FlexTouch Pen Inject 0.06-0.2 mLs (6-20 Units total) into the skin daily at 10 pm.  . Insulin Pen Needle (PEN NEEDLES) 32G X 5 MM MISC 1 Device by Does not apply route daily.  Marland Kitchen JARDIANCE 25 MG TABS tablet TAKE 1 TABLET ONCE A DAY  . mupirocin ointment (BACTROBAN) 2 % Apply 1 application topically 2 (two) times daily.  . Probiotic Product (PROBIOTIC PO) Take 1 tablet by mouth daily.  . simvastatin (ZOCOR) 40 MG tablet Take 1 tablet (40 mg total) by mouth every evening.  . sitaGLIPtin (JANUVIA) 100 MG tablet Take 1 tablet (100 mg total) by mouth daily.  Marland Kitchen triamcinolone ointment (  KENALOG) 0.5 % APPLY TO AFFECTED AREAS TWICE A DAY   Past Medical History:  Diagnosis Date  . Adenomatous colon polyp   . Arthritis   . Cataract   . Chest pain at rest 05/26/2016  . Diabetes mellitus without complication ([place])   . Dyslipidemia   . ESOPHAGITIS, REFLUX 07/09/2004   Qualifier: Diagnosis of  By: Hardin Negus CMA (AAMA), [place]    . Hiatal hernia   . HOH (hard of hearing)    has bilateral Hearing aids  . Hypertension   . Irritable bowel syndrome   . Kidney stone 1970  . Pneumonia   . Tick bites    took 5 rounds of Doxycycline this summer   Past Surgical History:  Procedure Laterality Date  . COLONOSCOPY    . ESOPHAGEAL  DILATION  1996  . ESOPHAGEAL DILATION  2003  . FLEXIBLE SIGMOIDOSCOPY    . HEMICOLECTOMY  08/2005   Right  . KNEE SURGERY Right   . 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   Review of Systems See HPI otherwise negative  Objective:   Physical Exam BP 114/60   Pulse 72   Temp (!) 97.4 F (36.3 C)   Ht 5' 9"  (1.753 m)   Wt 170 lb (77.1 kg)   BMI 25.10 kg/m  NAD Eyes anicteric Lungs cta CorNL abd soft and NT BS + Alert and oriented x 3

## 2019-10-07 NOTE — Telephone Encounter (Signed)
Patient's wife informed to do 1/2 the regular evening dose as he just takes it daily at 10pm at a dosage depending on his blood sugar reading.

## 2019-10-07 NOTE — Patient Instructions (Signed)
You have been scheduled for an endoscopy and colonoscopy. Please follow the written instructions given to you at your visit today. Please pick up your prep supplies at the pharmacy within the next 1-3 days. If you use inhalers (even only as needed), please bring them with you on the day of your procedure.  I appreciate the opportunity to care for you. Carl Gessner, MD, FACG 

## 2019-10-07 NOTE — Telephone Encounter (Signed)
Pt's wife called regarding insulin instructions for EGD/COL.  She stated that pt is taking [place] Flextouch which is not listed on instructions.

## 2019-11-08 ENCOUNTER — Other Ambulatory Visit: Payer: Self-pay

## 2019-11-08 ENCOUNTER — Encounter: Payer: Self-pay | Admitting: Internal Medicine

## 2019-11-08 ENCOUNTER — Ambulatory Visit (AMBULATORY_SURGERY_CENTER): Payer: Medicare PPO | Admitting: Internal Medicine

## 2019-11-08 VITALS — BP 141/73 | HR 57 | Temp 97.1°F | Resp 18 | Ht 69.0 in | Wt 170.0 lb

## 2019-11-08 DIAGNOSIS — D124 Benign neoplasm of descending colon: Secondary | ICD-10-CM

## 2019-11-08 DIAGNOSIS — R1319 Other dysphagia: Secondary | ICD-10-CM

## 2019-11-08 DIAGNOSIS — Z86010 Personal history of colonic polyps: Secondary | ICD-10-CM

## 2019-11-08 DIAGNOSIS — R1310 Dysphagia, unspecified: Secondary | ICD-10-CM | POA: Diagnosis not present

## 2019-11-08 MED ORDER — SODIUM CHLORIDE 0.9 % IV SOLN: 500.0000 mL | Freq: Once | INTRAVENOUS | Status: DC

## 2019-11-08 NOTE — Progress Notes (Signed)
A/ox3, pleased with MAC, report to RN 

## 2019-11-08 NOTE — Patient Instructions (Addendum)
I dilated the esophagus - let's see if that fixes the swallowing trouble. I did not see any strictures or other problems.  One tiny colon polyp removed. Will get it analyzed but not planning on any more routine colonoscopy in you.  I appreciate the opportunity to care for you. Gatha Mayer, MD, [facility] Handouts given:  Polyps, diverticulosis, post dilation diet   Follow post dilation diet instructions Resume prior diet tomorrow Await pathology results  YOU HAD AN ENDOSCOPIC PROCEDURE TODAY AT [facility]:   Refer to the procedure report that was given to you for any specific questions about what was found during the examination.  If the procedure report does not answer your questions, please call your gastroenterologist to clarify.  If you requested that your care partner not be given the details of your procedure findings, then the procedure report has been included in a sealed envelope for you to review at your convenience later.  YOU SHOULD EXPECT: Some feelings of bloating in the abdomen. Passage of more gas than usual.  Walking can help get rid of the air that was put into your GI tract during the procedure and reduce the bloating. If you had a lower endoscopy (such as a colonoscopy or flexible sigmoidoscopy) you may notice spotting of blood in your stool or on the toilet paper. If you underwent a bowel prep for your procedure, you may not have a normal bowel movement for a few days.  Please Note:  You might notice some irritation and congestion in your nose or some drainage.  This is from the oxygen used during your procedure.  There is no need for concern and it should clear up in a day or so.  SYMPTOMS TO REPORT IMMEDIATELY:   Following lower endoscopy (colonoscopy or flexible sigmoidoscopy):  Excessive amounts of blood in the stool  Significant tenderness or worsening of abdominal pains  Swelling of the abdomen that is new, acute  Fever of 100F or  higher   Following upper endoscopy (EGD)  Vomiting of blood or coffee ground material  New chest pain or pain under the shoulder blades  Painful or persistently difficult swallowing  New shortness of breath  Fever of 100F or higher  Black, tarry-looking stools  For urgent or emergent issues, a gastroenterologist can be reached at any hour by calling [phone]. Do not use MyChart messaging for urgent concerns.    DIET:  We do recommend a small meal at first, but then you may proceed to your regular diet.  Drink plenty of fluids but you should avoid alcoholic beverages for 24 hours.  ACTIVITY:  You should plan to take it easy for the rest of today and you should NOT DRIVE or use heavy machinery until tomorrow (because of the sedation medicines used during the test).    FOLLOW UP: Our staff will call the number listed on your records 48-72 hours following your procedure to check on you and address any questions or concerns that you may have regarding the information given to you following your procedure. If we do not reach you, we will leave a message.  We will attempt to reach you two times.  During this call, we will ask if you have developed any symptoms of COVID 19. If you develop any symptoms (ie: fever, flu-like symptoms, shortness of breath, cough etc.) before then, please call [phone].  If you test positive for Covid 19 in the 2 weeks post procedure, please call and  report this information to [place].    If any biopsies were taken you will be contacted by phone or by letter within the next 1-3 weeks.  Please call us at [phone] if you have not heard about the biopsies in 3 weeks.    SIGNATURES/CONFIDENTIALITY: You and/or your care partner have signed paperwork which will be entered into your electronic medical record.  These signatures attest to the fact that that the information above on your After Visit Summary has been reviewed and is understood.  Full responsibility of  the confidentiality of this discharge information lies with you and/or your care-partner.

## 2019-11-08 NOTE — Progress Notes (Signed)
Pt's states no medical or surgical changes since previsit or office visit. 

## 2019-11-08 NOTE — Op Note (Signed)
[facility] Patient Name: Joe [name] Procedure Date: 11/08/2019 2:28 PM MRN: [id number] Endoscopist: Gatha Mayer , MD Age: 79 Referring MD:  Date of Birth: March 29, 1941 Gender: Male Account #: [id number] Procedure:                Upper GI endoscopy Indications:              Dysphagia Medicines:                Propofol per Anesthesia, Monitored Anesthesia Care Procedure:                Pre-Anesthesia Assessment:                           - Prior to the procedure, a History and Physical                            was performed, and patient medications and                            allergies were reviewed. The patient's tolerance of                            previous anesthesia was also reviewed. The risks                            and benefits of the procedure and the sedation                            options and risks were discussed with the patient.                            All questions were answered, and informed consent                            was obtained. Prior Anticoagulants: The patient has                            taken no previous anticoagulant or antiplatelet                            agents. ASA Grade Assessment: III - A patient with                            severe systemic disease. After reviewing the risks                            and benefits, the patient was deemed in                            satisfactory condition to undergo the procedure.                           After obtaining informed consent, the endoscope was  passed under direct vision. Throughout the                            procedure, the patient's blood pressure, pulse, and                            oxygen saturations were monitored continuously. The                            Endoscope was introduced through the mouth, and                            advanced to the second part of duodenum. The upper                            GI endoscopy was  accomplished without difficulty.                            The patient tolerated the procedure well. Scope In: Scope Out: Findings:                 The examined esophagus was mildly tortuous.                           The entire examined stomach was normal.                           The examined duodenum was normal.                           The gastroesophageal flap valve was visualized                            endoscopically and classified as Hill Grade III                            (minimal fold, loose to endoscope, hiatal hernia                            likely).                           The scope was withdrawn. Dilation was performed in                            the entire esophagus with a Maloney dilator with no                            resistance at 52 Fr. Estimated blood loss: none. Complications:            No immediate complications. Estimated Blood Loss:     Estimated blood loss: none. Impression:               - Tortuous esophagus. ? some dysmotility                           -  Normal stomach.                           - Normal examined duodenum.                           - Gastroesophageal flap valve classified as Hill                            Grade III (minimal fold, loose to endoscope, hiatal                            hernia likely).                           - Dilation performed in the entire esophagus.                           - No specimens collected. Recommendation:           - Patient has a contact number available for                            emergencies. The signs and symptoms of potential                            delayed complications were discussed with the                            patient. Return to normal activities tomorrow.                            Written discharge instructions were provided to the                            patient.                           - Clear liquids x 1 hour then soft foods rest of                             day. Start prior diet tomorrow.                           - Continue present medications.                           - See the other procedure note for documentation of                            additional recommendations. Gatha Mayer, MD 11/08/2019 3:10:28 PM This report has been signed electronically.

## 2019-11-08 NOTE — Op Note (Signed)
[facility] Patient Name: Joe [name] Procedure Date: 11/08/2019 2:27 PM MRN: [id number] Endoscopist: Gatha Mayer , MD Age: 79 Referring MD:  Date of Birth: 29-Oct-1940 Gender: Male Account #: [id number] Procedure:                Colonoscopy Indications:              High risk colon cancer surveillance: Personal                            history of colonic polyps Medicines:                Propofol per Anesthesia, Monitored Anesthesia Care Procedure:                Pre-Anesthesia Assessment:                           - Prior to the procedure, a History and Physical                            was performed, and patient medications and                            allergies were reviewed. The patient's tolerance of                            previous anesthesia was also reviewed. The risks                            and benefits of the procedure and the sedation                            options and risks were discussed with the patient.                            All questions were answered, and informed consent                            was obtained. Prior Anticoagulants: The patient has                            taken no previous anticoagulant or antiplatelet                            agents. ASA Grade Assessment: III - A patient with                            severe systemic disease. After reviewing the risks                            and benefits, the patient was deemed in                            satisfactory condition to undergo the procedure.  After obtaining informed consent, the colonoscope                            was passed under direct vision. Throughout the                            procedure, the patient's blood pressure, pulse, and                            oxygen saturations were monitored continuously. The                            Colonoscope was introduced through the anus and                            advanced to the  the ileocolonic anastomosis. The                            colonoscopy was performed without difficulty. The                            patient tolerated the procedure well. The quality                            of the bowel preparation was excellent. The rectum                            and Ileocolonic anastomsis areas were photographed. Scope In: 2:51:14 PM Scope Out: 2:59:45 PM Scope Withdrawal Time: 0 hours 6 minutes 30 seconds  Total Procedure Duration: 0 hours 8 minutes 31 seconds  Findings:                 The perianal and digital rectal examinations were                            normal. Pertinent negatives include normal prostate                            (size, shape, and consistency).                           A diminutive polyp was found in the descending                            colon. The polyp was sessile. The polyp was removed                            with a cold snare. Resection and retrieval were                            complete. Verification of patient identification                            for the specimen was done. Estimated blood loss was  minimal.                           There was evidence of a prior end-to-side                            ileo-colonic anastomosis in the transverse colon.                            This was patent and was characterized by healthy                            appearing mucosa.                           A few small-mouthed diverticula were found in the                            sigmoid colon.                           The exam was otherwise without abnormality on                            direct and retroflexion views. Complications:            No immediate complications. Estimated Blood Loss:     Estimated blood loss was minimal. Impression:               - One diminutive polyp in the descending colon,                            removed with a cold snare. Resected and retrieved.                            - Patent end-to-side ileo-colonic anastomosis,                            characterized by healthy appearing mucosa.                           - Diverticulosis in the sigmoid colon.                           - The examination was otherwise normal on direct                            and retroflexion views.                           - Personal history of colonic polyps. Recommendation:           - Patient has a contact number available for                            emergencies. The signs and symptoms of potential  delayed complications were discussed with the                            patient. Return to normal activities tomorrow.                            Written discharge instructions were provided to the                            patient.                           - Clear liquids x 1 hour then soft foods rest of                            day. Start prior diet tomorrow.                           - Continue present medications.                           - Await pathology results.                           - No repeat colonoscopy due to age. Gatha Mayer, MD 11/08/2019 3:13:40 PM This report has been signed electronically.

## 2019-11-08 NOTE — Progress Notes (Signed)
Called to room to assist during endoscopic procedure.  Patient ID and intended procedure confirmed with present staff. Received instructions for my participation in the procedure from the performing physician.  

## 2019-11-10 ENCOUNTER — Telehealth: Payer: Self-pay

## 2019-11-10 NOTE — Telephone Encounter (Signed)
°  Follow up Call-  Call back number 11/08/2019 11/08/2019  Post procedure Call Back phone  # [phone] please call this number. [phone]  Permission to leave phone message - Yes  Some recent data might be hidden     Patient questions:  Do you have a fever, pain , or abdominal swelling? No. Pain Score  0 *  Have you tolerated food without any problems? Yes.    Have you been able to return to your normal activities? Yes.    Do you have any questions about your discharge instructions: Diet   No. Medications  No. Follow up visit  No.  Do you have questions or concerns about your Care? No.  Actions: * If pain score is 4 or above: No action needed, pain <4. 1. Have you developed a fever since your procedure? no  2.   Have you had an respiratory symptoms (SOB or cough) since your procedure? no  3.   Have you tested positive for COVID 19 since your procedure no  4.   Have you had any family members/close contacts diagnosed with the COVID 19 since your procedure?  no   If yes to any of these questions please route to Joylene John, RN and Erenest Rasher, RN

## 2019-11-15 ENCOUNTER — Ambulatory Visit: Payer: Medicare PPO | Admitting: Family Medicine

## 2019-11-15 ENCOUNTER — Encounter: Payer: Self-pay | Admitting: Family Medicine

## 2019-11-15 ENCOUNTER — Other Ambulatory Visit: Payer: Self-pay

## 2019-11-15 ENCOUNTER — Encounter: Payer: Self-pay | Admitting: Internal Medicine

## 2019-11-15 VITALS — BP 90/49 | HR 59 | Temp 97.5°F | Ht 69.0 in | Wt 169.0 lb

## 2019-11-15 DIAGNOSIS — Z794 Long term (current) use of insulin: Secondary | ICD-10-CM

## 2019-11-15 DIAGNOSIS — E1140 Type 2 diabetes mellitus with diabetic neuropathy, unspecified: Secondary | ICD-10-CM

## 2019-11-15 DIAGNOSIS — E1169 Type 2 diabetes mellitus with other specified complication: Secondary | ICD-10-CM | POA: Diagnosis not present

## 2019-11-15 DIAGNOSIS — E1122 Type 2 diabetes mellitus with diabetic chronic kidney disease: Secondary | ICD-10-CM

## 2019-11-15 DIAGNOSIS — E1159 Type 2 diabetes mellitus with other circulatory complications: Secondary | ICD-10-CM

## 2019-11-15 DIAGNOSIS — L84 Corns and callosities: Secondary | ICD-10-CM

## 2019-11-15 DIAGNOSIS — Z7689 Persons encountering health services in other specified circumstances: Secondary | ICD-10-CM

## 2019-11-15 DIAGNOSIS — I10 Essential (primary) hypertension: Secondary | ICD-10-CM

## 2019-11-15 DIAGNOSIS — W57XXXA Bitten or stung by nonvenomous insect and other nonvenomous arthropods, initial encounter: Secondary | ICD-10-CM

## 2019-11-15 DIAGNOSIS — E785 Hyperlipidemia, unspecified: Secondary | ICD-10-CM

## 2019-11-15 DIAGNOSIS — N183 Chronic kidney disease, stage 3 unspecified: Secondary | ICD-10-CM

## 2019-11-15 DIAGNOSIS — I700 Atherosclerosis of aorta: Secondary | ICD-10-CM

## 2019-11-15 LAB — BAYER DCA HB A1C WAIVED: HB A1C (BAYER DCA - WAIVED): 8 % — ABNORMAL HIGH (ref <7.0)

## 2019-11-15 MED ORDER — DOXYCYCLINE HYCLATE 100 MG PO TABS
ORAL_TABLET | ORAL | 0 refills | Status: DC
Start: 2019-11-15 — End: 2019-11-30

## 2019-11-15 MED ORDER — OLMESARTAN MEDOXOMIL 20 MG PO TABS: 20.0000 mg | ORAL_TABLET | Freq: Every day | ORAL | 0 refills | Status: DC

## 2019-11-15 NOTE — Patient Instructions (Signed)
Doxycycline sent.  You will take 2 tablets a single dose as needed for tick bites.  Recall that this has to be taken within 48 hours of tick bite.  Monitor for signs and symptoms of Lyme.  I will have Almyra Free, our pharmacist, reach out to you about Dexcom  Your A1c today was 8.0.  Your goal is 7.0.  I am going to have Almyra Free look over your medications to see if there are any areas of improvement that we can make.  Given your labile blood sugars I hesitate to adjust your insulin further at this time.  However, we may consider adding mealtime insulin.  You may take Tylenol arthritis up to 3 times daily safely for pain.  Recall the cut off for low blood sugar is below 70.  You had labs performed today.  You will be contacted with the results of the labs once they are available, usually in the next 3 business days for routine lab work.  If you have an active my chart account, they will be released to your MyChart.  If you prefer to have these labs released to you via telephone, please let us know.  If you had a pap smear or biopsy performed, expect to be contacted in about 7-10 days.

## 2019-11-15 NOTE — Progress Notes (Signed)
Subjective: CC: est care, DM PCP: Janora Norlander, DO ERD:EYCXK Joe [name] is a 79 y.o. male presenting to clinic today for:  1. Type 2 Diabetes w/ HTN, HLD, CKD3:  Patient reports blood sugars tend to run pretty good in the morning.  On average she has blood sugars around the low 100s and sometimes into the 70s and 80s.  No hypoglycemic episodes.  However, his blood sugars do tend to run higher in the evening times where they are typically above 200.  Taking medication(s): Januvia, Jardiance, Metformin BID (takes 500 mg in the morning and 1000 mg each evening), Tresiba 20 units but he notes that he does adjust this up and down per a sliding scale that was prescribed by his previous PCP, 1/2 tablet Benicar HCT 40-12.56m every other day, Zocor.  He did not know that Motrin was an NSAID and therefore did take this recently for his low back pain.  He was previously treated with a freestyle libre but unfortunate became allergic to the adhesive once it was changed.  He is currently using finger prick blood sugar checks but would be interested in Dexcom if this was available to him.  Last eye exam: Up-to-date Last foot exam: UTD Last A1c:  Lab Results  Component Value Date   HGBA1C 8.4 (H) 08/13/2019   Nephropathy screen indicated?:  On ARB Last flu, zoster and/or pneumovax:  Immunization History  Administered Date(s) Administered  . Fluad Quad(high Dose 65+) 03/16/2019  . Influenza, High Dose Seasonal PF 03/28/2016, 03/07/2017, 04/01/2018  . Influenza,inj,Quad PF,6+ Mos 03/23/2013, 03/26/2014, 03/17/2015  . Moderna SARS-COVID-2 Vaccination 06/21/2019, 07/19/2019  . Pneumococcal Conjugate-13 06/08/2013  . Pneumococcal Polysaccharide-23 04/03/2008  . Tdap 03/13/2011, 12/26/2018    ROS: No chest pain, shortness of breath, lower extremity edema.  He does have neuropathy of bilateral feet.  He points to all the toes.  No history of diabetic foot ulcer.  He would like to get diabetic  shoes.  2.  Tick bites Patient reports he sustained several tick bites over the last 3 to 4 days, most of which were on his rear 1 that was on his right hip.  He thinks he may still have the head embedded on the right hip as he still has mild surrounding erythema with a centralized dot.  No fevers, chills, myalgia, headache, nausea, vomiting.  No target lesions.  ROS: Per HPI  Allergies  Allergen Reactions  . Ace Inhibitors Cough  . Trazamine [Trazodone & Diet Manage Prod] Other (See Comments)    nightmares   Past Medical History:  Diagnosis Date  . Adenomatous colon polyp   . Arthritis   . Cataract   . Chest pain at rest 05/26/2016  . Diabetes mellitus without complication ([place]   . Dyslipidemia   . ESOPHAGITIS, REFLUX 07/09/2004   Qualifier: Diagnosis of  By: PHardin NegusCMA (AAMA), [place]   . Hiatal hernia   . HOH (hard of hearing)    has bilateral Hearing aids  . Hypertension   . Irritable bowel syndrome   . Kidney stone 1970  . Pneumonia   . Tick bites    took 5 rounds of Doxycycline this summer    Current Outpatient Medications:  .  Accu-Chek Softclix Lancets lancets, CHECK BLOOD SUGAR 4 TIMES A DAY OR AS DIRECTED, Disp: , Rfl:  .  aspirin 81 MG tablet, Take 81 mg by mouth daily.  , Disp: , Rfl:  .  Blood Glucose Monitoring Suppl w/Device KIT,  Test BS BID and PRN dx E11.9. Give test strips and lancets to match machine given, Disp: 1 each, Rfl: 1 .  cholecalciferol (VITAMIN D) 1000 UNITS tablet, Take 2,000 Units by mouth daily. , Disp: , Rfl:  .  Cinnamon 500 MG TABS, Take 1,000 tablets by mouth 2 (two) times daily., Disp: , Rfl:  .  doxylamine, Sleep, (UNISOM) 25 MG tablet, Take 25 mg by mouth at bedtime as needed for sleep. , Disp: , Rfl:  .  esomeprazole (NEXIUM) 40 MG capsule, TAKE (1) CAPSULE DAILY, Disp: 30 capsule, Rfl: 5 .  glucose blood (ONETOUCH ULTRA) test strip, Check blood sugar 4 times daily Dx E 11.40, Disp: 200 strip, Rfl: 11 .  glucose blood (ONETOUCH  ULTRA) test strip, , Disp: , Rfl:  .  glucose blood (ONETOUCH ULTRA) test strip, , Disp: , Rfl:  .  insulin degludec (TRESIBA FLEXTOUCH) 100 UNIT/ML SOPN FlexTouch Pen, Inject 0.06-0.2 mLs (6-20 Units total) into the skin daily at 10 pm., Disp: 2 pen, Rfl: 3 .  Insulin Pen Needle (PEN NEEDLES) 32G X 5 MM MISC, 1 Device by Does not apply route daily., Disp: 100 each, Rfl: 3 .  JARDIANCE 25 MG TABS tablet, TAKE 1 TABLET ONCE A DAY, Disp: 90 tablet, Rfl: 1 .  metFORMIN (GLUCOPHAGE-XR) 500 MG 24 hr tablet, Take 2 tablets (1,000 mg total) by mouth 2 (two) times daily with a meal. (Patient taking differently: Take 1,000 mg by mouth. ), Disp: 360 tablet, Rfl: 1 .  mupirocin ointment (BACTROBAN) 2 %, Apply 1 application topically 2 (two) times daily., Disp: 22 g, Rfl: 1 .  olmesartan-hydrochlorothiazide (BENICAR HCT) 40-12.5 MG tablet, Take 0.5 tablets by mouth daily., Disp: 15 tablet, Rfl: 6 .  Probiotic Product (PROBIOTIC PO), Take 1 tablet by mouth daily., Disp: , Rfl:  .  simvastatin (ZOCOR) 40 MG tablet, Take 1 tablet (40 mg total) by mouth every evening., Disp: 90 tablet, Rfl: 1 .  sitaGLIPtin (JANUVIA) 100 MG tablet, Take 1 tablet (100 mg total) by mouth daily., Disp: 90 tablet, Rfl: 2 .  triamcinolone ointment (KENALOG) 0.5 %, APPLY TO AFFECTED AREAS TWICE A DAY, Disp: 30 g, Rfl: 2 Social History   Socioeconomic History  . Marital status: Married    Spouse name: [name]  . Number of children: 1  . Years of education: 59  . Highest education level: Bachelor's degree (e.g., BA, AB, BS)  Occupational History  . Occupation: Retired Pharmacist, hospital  Tobacco Use  . Smoking status: Former Smoker    Packs/day: 2.00    Types: Cigarettes    Start date: 06/03/1954    Quit date: 08/24/1984    Years since quitting: 35.2  . Smokeless tobacco: Never Used  . Tobacco comment: Has not used to tobacco products for the past 20 years  Vaping Use  . Vaping Use: Never used  Substance and Sexual Activity  . Alcohol  use: Yes    Alcohol/week: 7.0 standard drinks    Types: 7 Standard drinks or equivalent per week    Comment: Bourbon at night when he sits on the deck  . Drug use: No  . Sexual activity: Yes    Partners: Female  Other Topics Concern  . Not on file  Social History Narrative   Lives in [place] with wife, both retired teachers   1 son   3 caffeinated beverages daily   1 alcoholic beverage daily   Former smoker no current tobacco no drug use   Social  Determinants of Health   Financial Resource Strain:   . Difficulty of Paying Living Expenses:   Food Insecurity:   . Worried About Charity fundraiser in the Last Year:   . Arboriculturist in the Last Year:   Transportation Needs:   . Film/video editor (Medical):   Marland Kitchen Lack of Transportation (Non-Medical):   Physical Activity: Insufficiently Active  . Days of Exercise per Week: 3 days  . Minutes of Exercise per Session: 30 min  Stress:   . Feeling of Stress :   Social Connections:   . Frequency of Communication with Friends and Family:   . Frequency of Social Gatherings with Friends and Family:   . Attends Religious Services:   . Active Member of Clubs or Organizations:   . Attends Archivist Meetings:   Marland Kitchen Marital Status:   Intimate Partner Violence:   . Fear of Current or Ex-Partner:   . Emotionally Abused:   Marland Kitchen Physically Abused:   . Sexually Abused:    Family History  Problem Relation Age of Onset  . Stroke Father 86  . Hyperlipidemia Father   . Hypertension Father   . Heart disease Brother   . Cancer Brother        lung   . Hyperlipidemia Brother   . Hypertension Brother   . Stroke Brother   . Diabetes Brother   . Breast cancer Mother   . Colon cancer Neg Hx   . Colon polyps Neg Hx   . Kidney disease Neg Hx   . Esophageal cancer Neg Hx   . Stomach cancer Neg Hx   . Rectal cancer Neg Hx     Objective: Office vital signs reviewed. BP (!) 90/49   Pulse (!) 59   Temp (!) 97.5 F (36.4 C)  (Temporal)   Ht 5' 9"  (1.753 m)   Wt 169 lb (76.7 kg)   SpO2 100%   BMI 24.96 kg/m   Physical Examination:  General: Awake, alert, well nourished, No acute distress HEENT: Normal; sclera white.  No goiter.  No exophthalmos Cardio: regular rate and rhythm, S1S2 heard, no murmurs appreciated Pulm: clear to auscultation bilaterally, no wheezes, rhonchi or rales; normal work of breathing on room air Extremities: warm, well perfused, No edema, cyanosis or clubbing; +2 pulses bilaterally  Feet: +2 pedal pulses.  He does have mild callus formation noted at the first plantar surface of MTPs bilaterally.  No ulcerations or discoloration MSK: normal gait and station Skin: Right hip with about a 2 mm area of mild blanching erythema and a centralized punctum with discoloration  Assessment/ Plan: 79 y.o. male   1. Type 2 diabetes mellitus with diabetic neuropathy, with long-term current use of insulin (HCC) Not at goal, though has improved A1c some from 8.4-8.0 today.  I think that he would benefit from [facility].  I am going to see if he qualifies for this.  He is to continue his current regimen now as I worry that increasing his long-acting would precipitate hypoglycemic episodes as he is already at goal in the morning.  I wonder if he might benefit from mealtime insulin and have asked him to check 2-hour postprandial blood sugars and record.  I will have our pharmacist, Almyra Free, reach out to him and we can make a plan going forward.  He is to follow-up in 3 months - Bayer DCA Hb A1c Waived - For Home Use Only DME Diabetic Shoe  2. Hyperlipidemia associated  with type 2 diabetes mellitus (HCC) Continue statin  3. CKD stage 3 due to type 2 diabetes mellitus (HCC) Avoid nephrotoxic agents.  Okay to use Tylenol for low back pains and arthralgias if needed - Basic Metabolic Panel  4. Hypertension associated with diabetes ([place]) Controlled/hypotensive.  For this reason I have reduced him to 20 mg every  other day of plain Benicar.  We discussed the possibility of needing half tablet of this versus discontinuing totally.  Encouraged p.o. hydration.  5. Abdominal aortic atherosclerosis (HCC) Sugar, blood pressure and cholesterol control as above  6. Establishing care with new doctor, encounter for  7. Tick bite, initial encounter No evidence of infection or Lyme disease.  I have given him a prescription for doxycycline for prophylaxis going forward.  We discussed the instructions and the prescription has been sent  8. Foot callus Diabetic shoes have been written and sent to pharmacy. - For Home Use Only DME Diabetic Shoe   No orders of the defined types were placed in this encounter.  No orders of the defined types were placed in this encounter.    Janora Norlander, DO [place] [phone]

## 2019-11-17 LAB — BASIC METABOLIC PANEL
BUN/Creatinine Ratio: 11 (ref 10–24)
BUN: 13 mg/dL (ref 8–27)
CO2: 22 mmol/L (ref 20–29)
Calcium: 9.2 mg/dL (ref 8.6–10.2)
Chloride: 101 mmol/L (ref 96–106)
Creatinine, Ser: 1.16 mg/dL (ref 0.76–1.27)
GFR calc Af Amer: 69 mL/min/{1.73_m2} (ref >59)
GFR calc non Af Amer: 60 mL/min/{1.73_m2} (ref >59)
Glucose: 92 mg/dL (ref 65–99)
Potassium: 3.9 mmol/L (ref 3.5–5.2)
Sodium: 136 mmol/L (ref 134–144)

## 2019-11-19 ENCOUNTER — Telehealth: Payer: Self-pay | Admitting: Pharmacist

## 2019-11-19 NOTE — Telephone Encounter (Signed)
Left message for patient to call back to schedule an appt with Clinical Pharacist

## 2019-11-19 NOTE — Telephone Encounter (Signed)
Have patient schedule with pharmD per PCP to attempt to get dexcom/libre--also may need meal time insulin  appt length 60 min

## 2019-11-23 ENCOUNTER — Other Ambulatory Visit: Payer: Self-pay

## 2019-11-23 ENCOUNTER — Ambulatory Visit: Payer: Medicare PPO | Admitting: Pharmacist

## 2019-11-23 DIAGNOSIS — Z794 Long term (current) use of insulin: Secondary | ICD-10-CM | POA: Diagnosis not present

## 2019-11-23 DIAGNOSIS — E1140 Type 2 diabetes mellitus with diabetic neuropathy, unspecified: Secondary | ICD-10-CM

## 2019-11-23 NOTE — Progress Notes (Signed)
    11/23/2019 Name: Joe [name] MRN: [id number] DOB: [birth year]   S:  43 yoF presents for diabetes evaluation, education, and management Patient was referred and last seen by Primary Care Provider on 11/15/19  Insurance coverage/medication affordability: [place]  Patient reports adherence with medications. . Current diabetes medications include: tresiba, metformin, jardiance, januvia . Current hypertension medications include: olmesartan Goal 130/80 . Current hyperlipidemia medications include: simvastatin   Patient denies hypoglycemic events.   Patient reported dietary habits: Eats 2-3 meals/day Discussed meal planning options and Plate method for healthy eating . Avoid sugary drinks and desserts . Incorporate balanced protein, non starchy veggies, 1 serving of carbohydrate with each meal . Increase water intake . Increase physical activity as able . Drinks: increased water intake  Patient-reported exercise habits: n/a, encouraged walking  O:  Lab Results  Component Value Date   HGBA1C 8.0 (H) 11/15/2019   Lipid Panel     Component Value Date/Time   CHOL 108 08/13/2019 0933   CHOL 105 12/24/2012 0850   TRIG 130 08/13/2019 0933   TRIG 207 (H) 10/29/2016 1100   TRIG 46 12/24/2012 0850   HDL 44 08/13/2019 0933   HDL 34 (L) 10/29/2016 1100   HDL 44 12/24/2012 0850   CHOLHDL 2.5 08/13/2019 0933   CHOLHDL 2.8 05/26/2016 0349   VLDL 20 05/26/2016 0349   LDLCALC 41 08/13/2019 0933   LDLCALC 59 02/03/2014 0840   LDLCALC 52 12/24/2012 0850    Home fasting blood sugars:  6/1-9: 78, 154, 85, 117, 126, 130, 113, 75, 123 6/10-19: 85, 97, 78, 87, 81, 70, 134, 82, 89, 84 6/19-22: 84, 92, 85, 149   A/P:  Diabetes T2Dm currently uncontrolled. Patient is able to verbalize appropriate hypoglycemia management plan. Patient is adherent with medication. Control is suboptimal due to diet. Patient would like to attempt diet and exercise control before switching any  medication.  Will attempt to get patient libre CGM system.  -Libre 2 CGM system given for patient to try since allergic to previous libre 2 adhesive.  It was recommended by the company to apply flonase to the skin, let dry, then apply libre 2   -unfortunately, [name] is not covered by insurance as patient is only injecting 1 time per day  -Continued basal insulin TRESIBA  -Continue metformin  -Continue Jardiance  -Continue Januvia  -Consider GLP1 at next visit, would d/c Januvia at that time  -Extensively discussed pathophysiology of diabetes, recommended lifestyle interventions, dietary effects on blood sugar control  -Counseled on s/sx of and management of hypoglycemia  -Next A1C anticipated 02/25/20.     Written patient instructions provided.  Total time in face to face counseling 30 minutes.   Follow up PCP Clinic Visit ON 02/25/20.    Regina Eck, PharmD, BCPS Clinical Pharmacist, [place]  II Phone [phone]

## 2019-11-26 ENCOUNTER — Telehealth: Payer: Self-pay | Admitting: *Deleted

## 2019-11-26 DIAGNOSIS — E1140 Type 2 diabetes mellitus with diabetic neuropathy, unspecified: Secondary | ICD-10-CM

## 2019-11-26 MED ORDER — METFORMIN HCL ER 500 MG PO TB24: 1000.0000 mg | ORAL_TABLET | Freq: Two times a day (BID) | ORAL | 1 refills | Status: DC

## 2019-11-26 NOTE — Telephone Encounter (Signed)
Last OV 11/15/19  Next OV 02/25/20

## 2019-11-30 ENCOUNTER — Encounter: Payer: Self-pay | Admitting: Nurse Practitioner

## 2019-11-30 ENCOUNTER — Ambulatory Visit: Payer: Medicare PPO | Admitting: Nurse Practitioner

## 2019-11-30 ENCOUNTER — Other Ambulatory Visit: Payer: Self-pay

## 2019-11-30 VITALS — BP 134/67 | HR 68 | Temp 97.7°F | Resp 20 | Ht 69.0 in | Wt 171.0 lb

## 2019-11-30 DIAGNOSIS — S30860A Insect bite (nonvenomous) of lower back and pelvis, initial encounter: Secondary | ICD-10-CM

## 2019-11-30 DIAGNOSIS — W57XXXA Bitten or stung by nonvenomous insect and other nonvenomous arthropods, initial encounter: Secondary | ICD-10-CM | POA: Insufficient documentation

## 2019-11-30 MED ORDER — DOXYCYCLINE HYCLATE 100 MG PO TABS: 100.0000 mg | ORAL_TABLET | Freq: Two times a day (BID) | ORAL | 0 refills | Status: DC

## 2019-11-30 NOTE — Assessment & Plan Note (Signed)
Patient is a 79 year old male who is in clinic today for tick bite.  Patient noticed tick 3 weeks ago stock on his left buttock.  Patient used a Q-tip and Dawn washing soap to remove tick.  Today patient's skin is red with the classic bull's-eye circle.  Skin is itchy dry and flaky.  This is not new for patient as patient has had several tick bites in the past.  Started patient on doxycycline twice a day for 21 days.  Lyme AB IgG/M ordered.  Results pending.  Provided education to patient on how to prevent tick bites in the future, printed handouts given to patient.  Patient verbalized understanding. Patient knows to follow-up with worsening or unresolved symptoms.

## 2019-11-30 NOTE — Patient Instructions (Addendum)
Tick bite Patient is a 79 year old male who is in clinic today for tick bite.  Patient noticed tick 3 weeks ago stock on his left buttock.  Patient used a Q-tip and Dawn washing soap to remove tick.  Today patient's skin is red with the classic bull's-eye circle.  Skin is itchy dry and flaky.  This is not new for patient as patient has had several tick bites in the past.  Started patient on doxycycline twice a day for 21 days.  Lyme AB IgG/M ordered.  Results pending.  Provided education to patient on how to prevent tick bites in the future, printed handouts given to patient.  Patient verbalized understanding. Patient knows to follow-up with worsening or unresolved symptoms.   Tick Bite Information, Adult  Ticks are insects that can bite. Most ticks live in shrubs and grassy areas. They climb onto people and animals that go by. Then they bite. Some ticks carry germs that can make you sick. How can I prevent tick bites?  Use an insect repellent that has 20% or higher of the ingredients DEET, picaridin, or IR3535. Put this insect repellent on: ? Bare skin. ? The tops of your boots. ? Your pant legs. ? The ends of your sleeves.  If you use an insect repellent that has the ingredient permethrin, make sure to follow the instructions on the bottle. Treat the following: ? Clothing. ? Supplies. ? Boots. ? Tents.  Wear long sleeves, long pants, and light colors.  Tuck your pant legs into your socks.  Stay in the middle of the trail.  Try not to walk through long grass.  Before going inside your house, check your clothes, hair, and skin for ticks. Make sure to check your head, neck, armpits, waist, groin, and joint areas.  Check for ticks every day.  When you come indoors: ? Wash your clothes right away. ? Shower right away. ? Dry your clothes in a dryer on high heat for 60 minutes or more. What is the right way to remove a tick? Remove a tick from your skin as soon as possible.  To  remove a tick that is crawling on your skin: ? Go outdoors and brush the tick off. ? Use tape or a lint roller.  To remove a tick that is biting: ? Wash your hands. ? If you have latex gloves, put them on. ? Use tweezers, curved forceps, or a tick-removal tool to grasp the tick. Grasp the tick as close to your skin and as close to the tick's head as possible. ? Gently pull up until the tick lets go.  Try to keep the tick's head attached to its body.  Do not twist or jerk the tick.  Do not squeeze or crush the tick. Do not try to remove a tick with heat, alcohol, petroleum jelly, or fingernail polish. How should I get rid of a tick? Here are some ways to get rid of a tick that is alive:  Place the tick in rubbing alcohol.  Place the tick in a bag or container you can close tightly.  Wrap the tick tightly in tape.  Flush the tick down the toilet. Contact a doctor if:  You have symptoms of a disease, such as: ? Pain in a muscle, joint, or bone. ? Trouble walking or moving your legs. ? Numbness in your legs. ? Inability to move (paralysis). ? A red rash that makes a circle (bull's-eye rash). ? Redness and swelling where the tick  bit you. ? A fever. ? Throwing up (vomiting) over and over. ? Diarrhea. ? Weight loss. ? Tender and swollen lymph glands. ? Shortness of breath. ? Cough. ? Belly pain (abdominal pain). ? Headache. ? Being more tired than normal. ? A change in how alert (conscious) you are. ? Confusion. Get help right away if:  You cannot remove a tick.  A part of a tick breaks off and gets stuck in your skin.  You are feeling worse. Summary  Ticks may carry germs that can make you sick.  To prevent tick bites, wear long sleeves, long pants, and light colors. Use insect repellent. Follow the instructions on the bottle.  If the tick is biting, do not try to remove it with heat, alcohol, petroleum jelly, or fingernail polish.  Use tweezers, curved  forceps, or a tick-removal tool to grasp the tick. Gently pull up until the tick lets go. Do not twist or jerk the tick. Do not squeeze or crush the tick.  If you have symptoms, contact a doctor. This information is not intended to replace advice given to you by your health care provider. Make sure you discuss any questions you have with your health care provider. Document Revised: 05/04/2018 Document Reviewed: 08/30/2016 Elsevier Patient Education  [place].

## 2019-11-30 NOTE — Progress Notes (Signed)
Acute Office Visit  Subjective:    [name], male    DOB: [birth year], 79 y.o.   MRN: [id number]  Chief Complaint  Patient presents with  . Tick Removal    noticed several weeks ago = still red, circular,large area      Patient is in today for for tick bite.  Patient noticed 3 weeks ago.  Patient had a tick stuck in her skin, he was able to remove tick with Dawn dish washing soap and a Q-tip.  Patient reports redness, itchy, dry/flaky skin, and a huge red ring around the bite.  This is not new for patient.  Patient has had several tick bites in the past.  Patient is concerned that this particular incident may be worse than prior incidents.  Past Medical History:  Diagnosis Date  . Adenomatous colon polyp   . Arthritis   . Cataract   . Chest pain at rest 05/26/2016  . Diabetes mellitus without complication ([place])   . Dyslipidemia   . ESOPHAGITIS, REFLUX 07/09/2004   Qualifier: Diagnosis of  By: Hardin Negus CMA (AAMA), [place]    . Hiatal hernia   . HOH (hard of hearing)    has bilateral Hearing aids  . Hypertension   . Irritable bowel syndrome   . Kidney stone 1970  . Pneumonia   . Tick bites    took 5 rounds of Doxycycline this summer    Past Surgical History:  Procedure Laterality Date  . COLONOSCOPY    . ESOPHAGEAL DILATION  1996  . ESOPHAGEAL DILATION  2003  . FLEXIBLE SIGMOIDOSCOPY    . HEMICOLECTOMY  08/2005   Right  . KNEE SURGERY Right   . TONSILLECTOMY      Family History  Problem Relation Age of Onset  . Stroke Father 54  . Hyperlipidemia Father   . Hypertension Father   . Heart disease Brother   . Cancer Brother        lung   . Hyperlipidemia Brother   . Hypertension Brother   . Stroke Brother   . Diabetes Brother   . Breast cancer Mother   . Colon cancer Neg Hx   . Colon polyps Neg Hx   . Kidney disease Neg Hx   . Esophageal cancer Neg Hx   . Stomach cancer Neg Hx   . Rectal cancer Neg Hx     Social History    Socioeconomic History  . Marital status: Married    Spouse name: [name]  . Number of children: 1  . Years of education: 43  . Highest education level: Bachelor's degree (e.g., BA, AB, BS)  Occupational History  . Occupation: Retired Pharmacist, hospital  Tobacco Use  . Smoking status: Former Smoker    Packs/day: 2.00    Types: Cigarettes    Start date: 06/03/1954    Quit date: 08/24/1984    Years since quitting: 35.2  . Smokeless tobacco: Never Used  . Tobacco comment: Has not used to tobacco products for the past 20 years  Vaping Use  . Vaping Use: Never used  Substance and Sexual Activity  . Alcohol use: Yes    Alcohol/week: 7.0 standard drinks    Types: 7 Standard drinks or equivalent per week    Comment: Bourbon at night when he sits on the deck  . Drug use: No  . Sexual activity: Yes    Partners: Female  Other Topics Concern  . Not on file  Social History Narrative  Lives in [place] with wife, both retired teachers   1 son   3 caffeinated beverages daily   1 alcoholic beverage daily   Former smoker no current tobacco no drug use   Social Determinants of Radio broadcast assistant Strain:   . Difficulty of Paying Living Expenses:   Food Insecurity:   . Worried About Charity fundraiser in the Last Year:   . Arboriculturist in the Last Year:   Transportation Needs:   . Film/video editor (Medical):   Marland Kitchen Lack of Transportation (Non-Medical):   Physical Activity: Insufficiently Active  . Days of Exercise per Week: 3 days  . Minutes of Exercise per Session: 30 min  Stress:   . Feeling of Stress :   Social Connections:   . Frequency of Communication with Friends and Family:   . Frequency of Social Gatherings with Friends and Family:   . Attends Religious Services:   . Active Member of Clubs or Organizations:   . Attends Archivist Meetings:   Marland Kitchen Marital Status:   Intimate Partner Violence:   . Fear of Current or Ex-Partner:   . Emotionally Abused:   Marland Kitchen  Physically Abused:   . Sexually Abused:     Outpatient Medications Prior to Visit  Medication Sig Dispense Refill  . Accu-Chek Softclix Lancets lancets CHECK BLOOD SUGAR 4 TIMES A DAY OR AS DIRECTED    . aspirin 81 MG tablet Take 81 mg by mouth daily.      . Blood Glucose Monitoring Suppl w/Device KIT Test BS BID and PRN dx E11.9. Give test strips and lancets to match machine given 1 each 1  . cholecalciferol (VITAMIN D) 1000 UNITS tablet Take 2,000 Units by mouth daily.     . Cinnamon 500 MG TABS Take 1,000 tablets by mouth 2 (two) times daily.    Marland Kitchen doxylamine, Sleep, (UNISOM) 25 MG tablet Take 25 mg by mouth at bedtime as needed for sleep.     Marland Kitchen esomeprazole (NEXIUM) 40 MG capsule TAKE (1) CAPSULE DAILY 30 capsule 5  . glucose blood (ONETOUCH ULTRA) test strip Check blood sugar 4 times daily Dx E 11.40 200 strip 11  . insulin degludec (TRESIBA FLEXTOUCH) 100 UNIT/ML SOPN FlexTouch Pen Inject 0.06-0.2 mLs (6-20 Units total) into the skin daily at 10 pm. 2 pen 3  . Insulin Pen Needle (PEN NEEDLES) 32G X 5 MM MISC 1 Device by Does not apply route daily. 100 each 3  . JARDIANCE 25 MG TABS tablet TAKE 1 TABLET ONCE A DAY 90 tablet 1  . metFORMIN (GLUCOPHAGE-XR) 500 MG 24 hr tablet Take 2 tablets (1,000 mg total) by mouth 2 (two) times daily with a meal. 360 tablet 1  . mupirocin ointment (BACTROBAN) 2 % Apply 1 application topically 2 (two) times daily. 22 g 1  . olmesartan (BENICAR) 20 MG tablet Take 1 tablet (20 mg total) by mouth daily. STOP the Benicar HCT 90 tablet 0  . Probiotic Product (PROBIOTIC PO) Take 1 tablet by mouth daily.    . simvastatin (ZOCOR) 40 MG tablet Take 1 tablet (40 mg total) by mouth every evening. 90 tablet 1  . sitaGLIPtin (JANUVIA) 100 MG tablet Take 1 tablet (100 mg total) by mouth daily. 90 tablet 2  . triamcinolone ointment (KENALOG) 0.5 % APPLY TO AFFECTED AREAS TWICE A DAY 30 g 2  . doxycycline (VIBRA-TABS) 100 MG tablet Take 2 tablets (277m) as a single dose  as  needed for tick bites.  Repeat as necessary 20 tablet 0   No facility-administered medications prior to visit.    Allergies  Allergen Reactions  . Ace Inhibitors Cough  . Trazamine [Trazodone & Diet Manage Prod] Other (See Comments)    nightmares    Review of Systems  Constitutional: Negative.   HENT: Negative.   Eyes: Negative.   Respiratory: Negative.   Cardiovascular: Negative.   Gastrointestinal: Negative.   Genitourinary: Negative.   Musculoskeletal: Negative for myalgias, neck pain and neck stiffness.  Skin: Positive for rash.       Bull eye rash on left buttock  Neurological: Negative for syncope, weakness, light-headedness and headaches.  Psychiatric/Behavioral: Negative for confusion.       Objective:    Physical Exam Constitutional:      Appearance: Normal appearance.  HENT:     Head: Normocephalic.  Eyes:     Conjunctiva/sclera: Conjunctivae normal.  Cardiovascular:     Rate and Rhythm: Normal rate and regular rhythm.     Pulses: Normal pulses.  Pulmonary:     Effort: Pulmonary effort is normal.     Breath sounds: Wheezing present.     Comments: Faint inspiratory wheeze on left lower lobe Musculoskeletal:        General: No tenderness.     Cervical back: Neck supple. No tenderness.  Skin:    General: Skin is dry.     Findings: Erythema and rash present.  Neurological:     Mental Status: He is alert and oriented to person, place, and time.  Psychiatric:        Mood and Affect: Mood normal.        Behavior: Behavior normal.     BP 134/67   Pulse 68   Temp 97.7 F (36.5 C)   Resp 20   Ht 5' 9"  (1.753 m)   Wt 171 lb (77.6 kg)   SpO2 98%   BMI 25.25 kg/m  Wt Readings from Last 3 Encounters:  11/30/19 171 lb (77.6 kg)  11/15/19 169 lb (76.7 kg)  11/08/19 170 lb (77.1 kg)    There are no preventive care reminders to display for this patient.  There are no preventive care reminders to display for this patient.   Lab Results   Component Value Date   TSH 0.918 03/07/2017   Lab Results  Component Value Date   WBC 5.4 08/13/2019   HGB 13.1 08/13/2019   HCT 38.4 08/13/2019   MCV 93 08/13/2019   PLT 145 (L) 08/13/2019   Lab Results  Component Value Date   NA 136 11/15/2019   K 3.9 11/15/2019   CO2 22 11/15/2019   GLUCOSE 92 11/15/2019   BUN 13 11/15/2019   CREATININE 1.16 11/15/2019   BILITOT 0.6 08/13/2019   ALKPHOS 60 08/13/2019   AST 18 08/13/2019   ALT 10 08/13/2019   PROT 6.5 08/13/2019   ALBUMIN 4.4 08/13/2019   CALCIUM 9.2 11/15/2019   ANIONGAP 9 05/26/2016   Lab Results  Component Value Date   CHOL 108 08/13/2019   Lab Results  Component Value Date   HDL 44 08/13/2019   Lab Results  Component Value Date   LDLCALC 41 08/13/2019   Lab Results  Component Value Date   TRIG 130 08/13/2019   Lab Results  Component Value Date   CHOLHDL 2.5 08/13/2019   Lab Results  Component Value Date   HGBA1C 8.0 (H) 11/15/2019       Assessment &  Plan:   Problem List Items Addressed This Visit      Musculoskeletal and Integument   Tick bite - Primary    Patient is a 79 year old male who is in clinic today for tick bite.  Patient noticed tick 3 weeks ago stock on his left buttock.  Patient used a Q-tip and Dawn washing soap to remove tick.  Today patient's skin is red with the classic bull's-eye circle.  Skin is itchy dry and flaky.  This is not new for patient as patient has had several tick bites in the past.  Started patient on doxycycline twice a day for 21 days.  Lyme AB IgG/M ordered.  Results pending.  Provided education to patient on how to prevent tick bites in the future, printed handouts given to patient.  Patient verbalized understanding. Patient knows to follow-up with worsening or unresolved symptoms.      Relevant Medications   doxycycline (VIBRA-TABS) 100 MG tablet   Other Relevant Orders   Lyme Ab (IgG/M) + RMSF( IgG/M)       Meds ordered this encounter  Medications   . doxycycline (VIBRA-TABS) 100 MG tablet    Sig: Take 1 tablet (100 mg total) by mouth 2 (two) times daily.    Dispense:  42 tablet    Refill:  0    Order Specific Question:   Supervising Provider    Answer:   Caryl Pina A [[id number]]     Ivy Lynn, NP

## 2019-12-01 ENCOUNTER — Telehealth: Payer: Self-pay | Admitting: Family Medicine

## 2019-12-01 DIAGNOSIS — L84 Corns and callosities: Secondary | ICD-10-CM

## 2019-12-01 NOTE — Telephone Encounter (Signed)
Called pharm - rx for shoes is in process = they state this can take awhile   Pt aware   Pharm aware - per [employer] - they have not received a request yet.  Pharm has mailed it - it goes to claims dept - and does take some time.

## 2019-12-02 LAB — LYMEAB(IGG/M)+RMSF(IGG/M)
LYME DISEASE AB, QUANT, IGM: <0.8 index (ref 0.00–0.79)
Lyme IgG/IgM Ab: <0.91 {ISR} (ref 0.00–0.90)
RMSF IgG: POSITIVE — AB
RMSF IgM: 0.47 index (ref 0.00–0.89)

## 2019-12-02 LAB — RMSF, IGG, IFA: RMSF, IGG, IFA: 1:128 {titer} — ABNORMAL HIGH

## 2019-12-03 ENCOUNTER — Telehealth: Payer: Self-pay | Admitting: Family Medicine

## 2019-12-03 NOTE — Telephone Encounter (Signed)
Pts wife called to request a nurse to call to explain her husbands lab results. Also states she received a VM that he needs to set up an appt with Almyra Free. She is unsure what the appt is for. Please advise.

## 2019-12-03 NOTE — Telephone Encounter (Signed)
Called the pt's wife back and she said she had gotten a VM from a "Lorriane Shire" about calling about an appt. I don't see a call in the chart and we don't have anyone named Lorriane Shire here. She did have questions regarding his labs so I reviewed those with her and answered all questions to her satisfaction.

## 2019-12-10 ENCOUNTER — Encounter: Payer: Self-pay | Admitting: Pharmacist

## 2019-12-17 ENCOUNTER — Ambulatory Visit: Payer: Medicare PPO | Admitting: Pharmacist

## 2019-12-17 ENCOUNTER — Other Ambulatory Visit: Payer: Self-pay

## 2019-12-17 DIAGNOSIS — E1140 Type 2 diabetes mellitus with diabetic neuropathy, unspecified: Secondary | ICD-10-CM | POA: Diagnosis not present

## 2019-12-17 MED ORDER — FREESTYLE LIBRE 2 SENSOR MISC: 6 refills | Status: DC

## 2019-12-17 MED ORDER — FREESTYLE LIBRE 2 READER DEVI: 0 refills | Status: DC

## 2019-12-17 NOTE — Progress Notes (Signed)
° ° °  12/17/2019 Name: Joe [name] MRN: [id number] DOB: [birth year]   S:  86 yoF presents for diabetes evaluation, education, and management Patient was referred and last seen by Primary Care Provider on 11/15/19  Insurance coverage/medication affordability: [place]  Patient reports adherence with medications.  Current diabetes medications include: tresiba, metformin, jardiance, januvia  Current hypertension medications include: olmesartan Goal 130/80  Current hyperlipidemia medications include: simvastatin   Patient denies hypoglycemic events.   Patient reported dietary habits: Eats 2-3 meals/day Discussed meal planning options and Plate method for healthy eating  Avoid sugary drinks and desserts  Incorporate balanced protein, non starchy veggies, 1 serving of carbohydrate with each meal  Increase water intake  Increase physical activity as able  Drinks: increased water intake  Patient-reported exercise habits: n/a, encouraged walking   O:  Lab Results  Component Value Date   HGBA1C 8.0 (H) 11/15/2019   Lipid Panel     Component Value Date/Time   CHOL 108 08/13/2019 0933   CHOL 105 12/24/2012 0850   TRIG 130 08/13/2019 0933   TRIG 207 (H) 10/29/2016 1100   TRIG 46 12/24/2012 0850   HDL 44 08/13/2019 0933   HDL 34 (L) 10/29/2016 1100   HDL 44 12/24/2012 0850   CHOLHDL 2.5 08/13/2019 0933   CHOLHDL 2.8 05/26/2016 0349   VLDL 20 05/26/2016 0349   LDLCALC 41 08/13/2019 0933   LDLCALC 59 02/03/2014 0840   LDLCALC 52 12/24/2012 0850    Home fasting blood sugars:  Last 7 days 169 Last 14 days 163 Last 30 days 164  2 hour post-meal/random blood sugars: post prandial breakfast inccreasing to 200s. Having rice crispies or cereal with added sugar REcommeded breakfast options such as eggs, with protein, protein bars, etc   A/P:  Diabetes T2DM currently uncontrolled. Patient is able to verbalize appropriate hypoglycemia management plan. Patient is  adherent with medication. Control is suboptimal due to diet.  -Libre 2 CGM system given for patient to try since allergic to previous libre 2 adhesive.  It was recommended by the company to apply flonase to the skin, let dry, then apply libre 2             -unfortunately, [name] is not covered by insurance as patient is only injecting 1 time per day, patient will pay $75 out of pocket using voucher at [employer] called in)  -patient has been tolerating libre 2 CGM system with the application of flonase on the skin prior  -Continued basal insulin TRESIBA  -Continue metformin  -Continue Jardiance  -Continue Januvia  -Consider GLP1 at next visit, would d/c Januvia at that time -Extensively discussed pathophysiology of diabetes, recommended lifestyle interventions, dietary effects on blood sugar control  -Counseled on s/sx of and management of hypoglycemia  -Next A1C anticipated 02/25/20.    Written patient instructions provided.  Total time in face to face counseling 30 minutes.   Follow upPCP Clinic Visit ON 02/25/20.    Regina Eck, PharmD, BCPS Clinical Pharmacist, [place]  II Phone [phone]

## 2019-12-20 ENCOUNTER — Encounter: Payer: Self-pay | Admitting: Pharmacist

## 2019-12-21 ENCOUNTER — Telehealth: Payer: Self-pay | Admitting: Family Medicine

## 2019-12-21 NOTE — Telephone Encounter (Signed)
Last night BG was 91 Gave grape juice BG 133 Then gave full dose of tresiba  This AM at 7:28 BG was 78  138 after lunch 250 banana pudding   Instructed patient to adjust basal at night to avoid hypoglycemia in the AM Tresiba 20 units nightly IF BGS: 100s 20 units  90s 18 units  80s 16 units  Patient and wife verbalize understanding

## 2020-01-19 ENCOUNTER — Encounter: Payer: Self-pay | Admitting: Family Medicine

## 2020-01-19 ENCOUNTER — Ambulatory Visit: Payer: Medicare PPO | Admitting: Family Medicine

## 2020-01-19 ENCOUNTER — Other Ambulatory Visit: Payer: Self-pay

## 2020-01-19 VITALS — BP 140/73 | HR 78 | Temp 98.6°F | Resp 20 | Ht 69.0 in | Wt 169.2 lb

## 2020-01-19 DIAGNOSIS — T63451A Toxic effect of venom of hornets, accidental (unintentional), initial encounter: Secondary | ICD-10-CM

## 2020-01-19 MED ORDER — BETAMETHASONE SOD PHOS & ACET 6 (3-3) MG/ML IJ SUSP
6.0000 mg | Freq: Once | INTRAMUSCULAR | Status: AC
Start: 2020-01-19 — End: 2020-01-19
  Administered 2020-01-19: 6 mg via INTRAMUSCULAR

## 2020-01-19 NOTE — Progress Notes (Signed)
Chief Complaint  Patient presents with  . Bee stings    HPI  Patient presents today for mu;tiple hornet stings on the right leg yesterday. Painful and red. Partial relief with hydrocortisone cream and oral benadryl. No palpitations, dyspnea or angioedema occurred.   PMH: Smoking status noted ROS: Per HPI  Objective: BP 140/73   Pulse 78   Temp 98.6 F (37 C) (Temporal)   Resp 20   Ht 5\' 9"  (1.753 m)   Wt 169 lb 4 oz (76.8 kg)   SpO2 100%   BMI 24.99 kg/m  Gen: NAD, alert, cooperative with exam HEENT: NCAT, EOMI, PERRL CV: RRR, good S1/S2, no murmur Resp: CTABL, no wheezes, non-labored Ext: No edema, warm. Blothcy erythema surrounding multiple wheals at RLE, calf Neuro: Alert and oriented, No gross deficits  Assessment and plan:  1. Hornet sting, accidental or unintentional, initial encounter     Meds ordered this encounter  Medications  . betamethasone acetate-betamethasone sodium phosphate (CELESTONE) injection 6 mg    Follow up as needed.  Claretta Fraise, MD

## 2020-01-24 ENCOUNTER — Other Ambulatory Visit: Payer: Self-pay

## 2020-01-24 DIAGNOSIS — Z794 Long term (current) use of insulin: Secondary | ICD-10-CM

## 2020-01-24 MED ORDER — TRESIBA FLEXTOUCH 100 UNIT/ML ~~LOC~~ SOPN: 6.0000 [IU] | PEN_INJECTOR | Freq: Every day | SUBCUTANEOUS | 3 refills | Status: DC

## 2020-02-09 ENCOUNTER — Ambulatory Visit (INDEPENDENT_AMBULATORY_CARE_PROVIDER_SITE_OTHER): Payer: Medicare PPO | Admitting: Pharmacist

## 2020-02-09 ENCOUNTER — Other Ambulatory Visit: Payer: Self-pay

## 2020-02-09 DIAGNOSIS — N183 Chronic kidney disease, stage 3 unspecified: Secondary | ICD-10-CM

## 2020-02-09 DIAGNOSIS — E1122 Type 2 diabetes mellitus with diabetic chronic kidney disease: Secondary | ICD-10-CM

## 2020-02-09 LAB — BAYER DCA HB A1C WAIVED: HB A1C (BAYER DCA - WAIVED): 7.2 % — ABNORMAL HIGH (ref <7.0)

## 2020-02-09 NOTE — Progress Notes (Signed)
    02/09/2020 Name: JADIER ROCKERS MRN: [id number] DOB: 01-14-1941   S:  87 yoF presents for diabetes evaluation, education, and management Patient was referred and last seen by Primary Care Provider on6/14/21.  Patient  Is using [employer] libre 2 CGM system.  Of note, he is using topical nasal steroid nasal spray (flonase) to avoid  Allergic reaction to CGM adhesive.  Insurance coverage/medication affordability:[employer] with medications.  Current diabetes medications include:tresiba, metformin, jardiance, januvia  Current hypertension medications include:olmesartan Goal 130/80  Current hyperlipidemia medications include:simvastatin  Patientdenieshypoglycemic events.  Patient reported dietary habits: Eats2-50meals/day Patient has made improvements to diet, especially at breakfast.  He is now eating eggs and protein in the mornings and avoiding post-breakfast increase in blood sugar Patient and wife keep a dedicated log of blood sugars and eating patterns daily  Discussed meal planning options and Plate method for healthy eating  Avoid sugary drinks and desserts  Incorporate balanced protein, non starchy veggies, 1 serving of carbohydrate with each meal  Increase water intake  Increase physical activity as able  Drinks:increased water intake  Patient-reported exercise habits:elliptical at home    O:  Lab Results  Component Value Date   HGBA1C 8.0 (H) 11/15/2019    There were no vitals filed for this visit.     Lipid Panel     Component Value Date/Time   CHOL 108 08/13/2019 0933   CHOL 105 12/24/2012 0850   TRIG 130 08/13/2019 0933   TRIG 207 (H) 10/29/2016 1100   TRIG 46 12/24/2012 0850   HDL 44 08/13/2019 0933   HDL 34 (L) 10/29/2016 1100   HDL 44 12/24/2012 0850   CHOLHDL 2.5 08/13/2019 0933   CHOLHDL 2.8 05/26/2016 0349   VLDL 20 05/26/2016 0349   LDLCALC 41 08/13/2019 0933   LDLCALC 59 02/03/2014  0840   LDLCALC 52 12/24/2012 0850     Home fasting blood sugars: 70s-100s  2 hour post-meal/random blood sugars: mostly <180  CGM system only had 2 days of data, given new sensor   A/P: Diabetes T2DM, A1c decreased to 7.2%.  Expect BGs/A1c to correct to <7% given recent (~33month) changes in diet, etc.  Patient is able to verbalize appropriate hypoglycemia management plan. Patient is adherent with medication.  -Continuedbasal insulin TRESIBA PM Blood Sugar Insulin dose  80-115   16 units >115   20 units Given patient's recent lower FBGs, will trial a reduced night time basal dose (as above) based on evening BG.  -Continue metformin  -Continue Jardiance  -Continue Januvia  -Consider GLP1 in the future (would d/c Januvia at this time)  -Extensively discussed pathophysiology of diabetes, recommended lifestyle interventions, dietary effects on blood sugar control  -Counseled on s/sx of and management of hypoglycemia  -Next A1C anticipated 3-6 months    Written patient instructions provided.  Total time in face to face counseling 30 minutes.   Follow up PCP Clinic Visit in 3 weeks.    Regina Eck, PharmD, BCPS Clinical Pharmacist, [place]  II Phone [phone]

## 2020-02-11 ENCOUNTER — Other Ambulatory Visit: Payer: Self-pay | Admitting: Family Medicine

## 2020-02-15 ENCOUNTER — Other Ambulatory Visit: Payer: Self-pay | Admitting: *Deleted

## 2020-02-15 DIAGNOSIS — I700 Atherosclerosis of aorta: Secondary | ICD-10-CM

## 2020-02-15 DIAGNOSIS — Z794 Long term (current) use of insulin: Secondary | ICD-10-CM

## 2020-02-15 DIAGNOSIS — E1169 Type 2 diabetes mellitus with other specified complication: Secondary | ICD-10-CM

## 2020-02-15 DIAGNOSIS — K219 Gastro-esophageal reflux disease without esophagitis: Secondary | ICD-10-CM

## 2020-02-15 MED ORDER — SITAGLIPTIN PHOSPHATE 100 MG PO TABS: 100.0000 mg | ORAL_TABLET | Freq: Every day | ORAL | 0 refills | Status: DC

## 2020-02-15 MED ORDER — ESOMEPRAZOLE MAGNESIUM 40 MG PO CPDR: DELAYED_RELEASE_CAPSULE | ORAL | 0 refills | Status: DC

## 2020-02-15 MED ORDER — EMPAGLIFLOZIN 25 MG PO TABS: 25.0000 mg | ORAL_TABLET | Freq: Every day | ORAL | 0 refills | Status: DC

## 2020-02-15 MED ORDER — SIMVASTATIN 40 MG PO TABS: 40.0000 mg | ORAL_TABLET | Freq: Every evening | ORAL | 0 refills | Status: DC

## 2020-02-25 ENCOUNTER — Encounter: Payer: Self-pay | Admitting: Family Medicine

## 2020-02-25 ENCOUNTER — Other Ambulatory Visit: Payer: Self-pay

## 2020-02-25 ENCOUNTER — Ambulatory Visit: Payer: Medicare PPO | Admitting: Family Medicine

## 2020-02-25 VITALS — BP 125/73 | HR 60 | Temp 98.1°F | Ht 69.0 in | Wt 170.0 lb

## 2020-02-25 DIAGNOSIS — N183 Chronic kidney disease, stage 3 unspecified: Secondary | ICD-10-CM

## 2020-02-25 DIAGNOSIS — E1169 Type 2 diabetes mellitus with other specified complication: Secondary | ICD-10-CM | POA: Diagnosis not present

## 2020-02-25 DIAGNOSIS — E1122 Type 2 diabetes mellitus with diabetic chronic kidney disease: Secondary | ICD-10-CM | POA: Diagnosis not present

## 2020-02-25 DIAGNOSIS — M25512 Pain in left shoulder: Secondary | ICD-10-CM

## 2020-02-25 DIAGNOSIS — I10 Essential (primary) hypertension: Secondary | ICD-10-CM | POA: Diagnosis not present

## 2020-02-25 DIAGNOSIS — E785 Hyperlipidemia, unspecified: Secondary | ICD-10-CM | POA: Diagnosis not present

## 2020-02-25 DIAGNOSIS — G8929 Other chronic pain: Secondary | ICD-10-CM

## 2020-02-25 DIAGNOSIS — E1159 Type 2 diabetes mellitus with other circulatory complications: Secondary | ICD-10-CM

## 2020-02-25 DIAGNOSIS — I700 Atherosclerosis of aorta: Secondary | ICD-10-CM

## 2020-02-25 MED ORDER — METHYLPREDNISOLONE ACETATE 40 MG/ML IJ SUSP
40.0000 mg | Freq: Once | INTRAMUSCULAR | Status: AC
  Administered 2020-02-25: 40 mg via INTRA_ARTICULAR

## 2020-02-25 NOTE — Progress Notes (Signed)
Subjective: CC: f/u DM PCP: Janora Norlander, DO Joe [name] is a 79 y.o. male presenting to clinic today for:  1. Type 2 Diabetes w/ HTN, HLD, CKD3:  Patient compliant with Metformin, Jardiance, Januvia and Tresiba.  A1c has dropped nicely to 7.2 from 8.0 after only 1 month of medication changes.  He has been doing well on th Freestyle Iibre 2 system.  Blood sugars are ranging typically in the low 90s to 100s in the morning.  He has some mid 100s in the evening times.  He has had 3 different episodes of blood sugars dropping into the 60s.  He does not report any symptoms.  However he did want to mention this.  Currently taking Benicar every other day.  Compliant with statin. Last eye exam: Up-to-date Last foot exam: needs Last A1c:  Lab Results  Component Value Date   HGBA1C 7.2 (H) 02/09/2020   Nephropathy screen indicated?:  On ARB Last flu, zoster and/or pneumovax:  Immunization History  Administered Date(s) Administered  . Fluad Quad(high Dose 65+) 03/16/2019  . Influenza, High Dose Seasonal PF 03/28/2016, 03/07/2017, 04/01/2018  . Influenza,inj,Quad PF,6+ Mos 03/23/2013, 03/26/2014, 03/17/2015  . Moderna SARS-COVID-2 Vaccination 06/21/2019, 07/19/2019  . Pneumococcal Conjugate-13 06/08/2013  . Pneumococcal Polysaccharide-23 04/03/2008  . Tdap 03/13/2011, 12/26/2018    ROS: No chest pain, shortness of breath, lower extremity edema.  He does have neuropathy of bilateral feet.   2. Chronic left shoulder pain Patient with chronic left shoulder pain.  No preceding injury.  He has had issues in the right shoulder but that has resolved.  He had been getting corticosteroid injections by his previous PCP and would like to repeat this today.  This does not inhibit his ability to function but is quite aggravating.  Does not report any sensory changes.  He has difficulty elevating past 90 degrees  ROS: Per HPI  Allergies  Allergen Reactions  . Ace Inhibitors Cough  .  Trazamine [Trazodone & Diet Manage Prod] Other (See Comments)    nightmares   Past Medical History:  Diagnosis Date  . Adenomatous colon polyp   . Arthritis   . Cataract   . Chest pain at rest 05/26/2016  . Diabetes mellitus without complication ([place])   . Dyslipidemia   . ESOPHAGITIS, REFLUX 07/09/2004   Qualifier: Diagnosis of  By: Hardin Negus CMA (AAMA), [place]    . Hiatal hernia   . HOH (hard of hearing)    has bilateral Hearing aids  . Hypertension   . Irritable bowel syndrome   . Kidney stone 1970  . Pneumonia   . Tick bites    took 5 rounds of Doxycycline this summer    Current Outpatient Medications:  .  Accu-Chek Softclix Lancets lancets, CHECK BLOOD SUGAR 4 TIMES A DAY OR AS DIRECTED, Disp: , Rfl:  .  aspirin 81 MG tablet, Take 81 mg by mouth daily.  , Disp: , Rfl:  .  Blood Glucose Monitoring Suppl w/Device KIT, Test BS BID and PRN dx E11.9. Give test strips and lancets to match machine given, Disp: 1 each, Rfl: 1 .  cholecalciferol (VITAMIN D) 1000 UNITS tablet, Take 2,000 Units by mouth daily. , Disp: , Rfl:  .  Cinnamon 500 MG TABS, Take 1,000 tablets by mouth 2 (two) times daily., Disp: , Rfl:  .  Continuous Blood Gluc Receiver (FREESTYLE LIBRE 2 READER) DEVI, Use to test blood sugars up to 6 times daily as directed. DX: E11.9, Disp: 1  each, Rfl: 0 .  Continuous Blood Gluc Sensor (FREESTYLE LIBRE 2 SENSOR) MISC, Use to test blood sugars up to 6 times daily as directed. DX: E11.9, Disp: 2 each, Rfl: 6 .  doxycycline (VIBRA-TABS) 100 MG tablet, Take 1 tablet (100 mg total) by mouth 2 (two) times daily., Disp: 42 tablet, Rfl: 0 .  doxylamine, Sleep, (UNISOM) 25 MG tablet, Take 25 mg by mouth at bedtime as needed for sleep. , Disp: , Rfl:  .  empagliflozin (JARDIANCE) 25 MG TABS tablet, Take 1 tablet (25 mg total) by mouth daily., Disp: 90 tablet, Rfl: 0 .  esomeprazole (NEXIUM) 40 MG capsule, TAKE (1) CAPSULE DAILY, Disp: 30 capsule, Rfl: 0 .  glucose blood (ONETOUCH  ULTRA) test strip, Check blood sugar 4 times daily Dx E 11.40, Disp: 200 strip, Rfl: 11 .  insulin degludec (TRESIBA FLEXTOUCH) 100 UNIT/ML FlexTouch Pen, Inject 0.06-0.2 mLs (6-20 Units total) into the skin daily at 10 pm., Disp: 15 mL, Rfl: 3 .  Insulin Pen Needle (PEN NEEDLES) 32G X 5 MM MISC, 1 Device by Does not apply route daily., Disp: 100 each, Rfl: 3 .  metFORMIN (GLUCOPHAGE-XR) 500 MG 24 hr tablet, Take 2 tablets (1,000 mg total) by mouth 2 (two) times daily with a meal., Disp: 360 tablet, Rfl: 1 .  mupirocin ointment (BACTROBAN) 2 %, Apply 1 application topically 2 (two) times daily., Disp: 22 g, Rfl: 1 .  olmesartan (BENICAR) 20 MG tablet, Take 1 tablet by mouth daily. STOP the Benicar HCT, Disp: 90 tablet, Rfl: 0 .  Probiotic Product (PROBIOTIC PO), Take 1 tablet by mouth daily., Disp: , Rfl:  .  simvastatin (ZOCOR) 40 MG tablet, Take 1 tablet (40 mg total) by mouth every evening., Disp: 90 tablet, Rfl: 0 .  sitaGLIPtin (JANUVIA) 100 MG tablet, Take 1 tablet (100 mg total) by mouth daily., Disp: 90 tablet, Rfl: 0 .  triamcinolone ointment (KENALOG) 0.5 %, APPLY TO AFFECTED AREAS TWICE A DAY, Disp: 30 g, Rfl: 2 Social History   Socioeconomic History  . Marital status: Married    Spouse name: [name]  . Number of children: 1  . Years of education: 1  . Highest education level: Bachelor's degree (e.g., BA, AB, BS)  Occupational History  . Occupation: Retired Pharmacist, hospital  Tobacco Use  . Smoking status: Former Smoker    Packs/day: 2.00    Types: Cigarettes    Start date: 06/03/1954    Quit date: 08/24/1984    Years since quitting: 35.5  . Smokeless tobacco: Never Used  . Tobacco comment: Has not used to tobacco products for the past 20 years  Vaping Use  . Vaping Use: Never used  Substance and Sexual Activity  . Alcohol use: Yes    Alcohol/week: 7.0 standard drinks    Types: 7 Standard drinks or equivalent per week    Comment: Bourbon at night when he sits on the deck  . Drug  use: No  . Sexual activity: Yes    Partners: Female  Other Topics Concern  . Not on file  Social History Narrative   Lives in [place] with wife, both retired teachers   1 son   3 caffeinated beverages daily   1 alcoholic beverage daily   Former smoker no current tobacco no drug use   Social Determinants of Radio broadcast assistant Strain:   . Difficulty of Paying Living Expenses: Not on file  Food Insecurity:   . Worried About Charity fundraiser  in the Last Year: Not on file  . Ran Out of Food in the Last Year: Not on file  Transportation Needs:   . Lack of Transportation (Medical): Not on file  . Lack of Transportation (Non-Medical): Not on file  Physical Activity:   . Days of Exercise per Week: Not on file  . Minutes of Exercise per Session: Not on file  Stress:   . Feeling of Stress : Not on file  Social Connections:   . Frequency of Communication with Friends and Family: Not on file  . Frequency of Social Gatherings with Friends and Family: Not on file  . Attends Religious Services: Not on file  . Active Member of Clubs or Organizations: Not on file  . Attends Archivist Meetings: Not on file  . Marital Status: Not on file  Intimate Partner Violence:   . Fear of Current or Ex-Partner: Not on file  . Emotionally Abused: Not on file  . Physically Abused: Not on file  . Sexually Abused: Not on file   Family History  Problem Relation Age of Onset  . Stroke Father 32  . Hyperlipidemia Father   . Hypertension Father   . Heart disease Brother   . Cancer Brother        lung   . Hyperlipidemia Brother   . Hypertension Brother   . Stroke Brother   . Diabetes Brother   . Breast cancer Mother   . Colon cancer Neg Hx   . Colon polyps Neg Hx   . Kidney disease Neg Hx   . Esophageal cancer Neg Hx   . Stomach cancer Neg Hx   . Rectal cancer Neg Hx     Objective: Office vital signs reviewed. BP 125/73   Pulse 60   Temp 98.1 F (36.7 C) (Temporal)    Ht _0  (1.753 m)   Wt 170 lb (77.1 kg)   SpO2 100%   BMI 25.10 kg/m   Physical Examination:  General: Awake, alert, well nourished, No acute distress HEENT: Normal; sclera white.  Cardio: regular rate and rhythm, S1S2 heard, no murmurs appreciated Pulm: clear to auscultation bilaterally, no wheezes, rhonchi or rales; normal work of breathing on room air Extremities: warm, well perfused, No edema, cyanosis or clubbing; +2 pulses   Left shoulder: Painful arc sign noted.  Unable to actively raise past 90 degrees.  No appreciable deformities or atrophy.  No tenderness palpation.  No soft tissue swelling, erythema or joint effusion appreciated  Diabetic Foot Exam - Simple   Simple Foot Form Diabetic Foot exam was performed with the following findings: Yes 02/25/2020  8:40 AM  Visual Inspection No deformities, no ulcerations, no other skin breakdown bilaterally: Yes Sensation Testing See comments: Yes Pulse Check Posterior Tibialis and Dorsalis pulse intact bilaterally: Yes Comments Mildly decreased vibratory and monofilament sensation in left foot.       JOINT INJECTION:  Patient denies allergy to antiseptics (including iodine) and anesthetics.  Patient has mildly uncontrolled diabetes. No frequent steroid use, use of blood thinners/ antiplatelets.  Patient was given informed consent and a signed copy has been placed in the chart. Appropriate time out was taken. Area prepped and draped in usual sterile fashion. Anatomic landmarks were identified and injection site was marked.  Ethyl chloride spray was used to numb the area and 1 cc of methylprednisolone 40 mg/ml plus  3 cc of 1% lidocaine without epinephrine was injected into the left shoulder using a(n) posterior approach. The  patient tolerated the procedure well and there were no immediate complications. Estimated blood loss is less than 1 cc.  Post procedure instructions were reviewed and handout outlining these instructions were  provided to patient.  Assessment/ Plan: 79 y.o. male   1. Diabetes mellitus with stage 3 chronic kidney disease (HCC) A1c coming down with diet modification.  I congratulated him on this and encouraged him to do this ongoing.  He had a couple of blood sugars that were into the 60s.  We discussed that if this becomes more frequent, we would need to back down on the [place].  He and his wife voiced good understanding.  Otherwise continue current regimen and follow-up in 4 months  2. Hyperlipidemia associated with type 2 diabetes mellitus ([place]) Continue current medications  3. Hypertension associated with diabetes ([place]) Controlled.  Continue current medications for now.  He does have 2 systolic blood pressures of less than 100 noted over the last several months.  We discussed need for hydration.  If he continues to have these we need to back down on the Benicar  4. Abdominal aortic atherosclerosis ([place]) Continue blood pressure, cholesterol and sugar control as above  5. Chronic left shoulder pain Given corticosteroid injection.  Home care instructions reviewed.  Red flags discussed.  If ineffective, may need to consider orthopedics and they will contact me about this   No orders of the defined types were placed in this encounter.  No orders of the defined types were placed in this encounter.    Janora Norlander, DO [place] [phone]

## 2020-02-25 NOTE — Patient Instructions (Signed)
Hydrate. Monitor Sugar and Blood pressure as we discussed.  If shot is not helpful, let me know and I will refer to orthopedics

## 2020-02-25 NOTE — Addendum Note (Signed)
Addended byCarrolyn Leigh on: 02/25/2020 12:17 PM   Modules accepted: Orders

## 2020-03-14 ENCOUNTER — Ambulatory Visit (INDEPENDENT_AMBULATORY_CARE_PROVIDER_SITE_OTHER): Payer: Medicare PPO

## 2020-03-14 DIAGNOSIS — Z0000 Encounter for general adult medical examination without abnormal findings: Secondary | ICD-10-CM

## 2020-03-14 NOTE — Patient Instructions (Signed)
  Joe [name] , Thank you for taking time to come for your Medicare Wellness Visit. I appreciate your ongoing commitment to your health goals. Please review the following plan we discussed and let me know if I can assist you in the future.   These are the goals we discussed: Goals    . DIET - EAT MORE FRUITS AND VEGETABLES    . DIET - INCREASE WATER INTAKE    . Exercise 150 min/wk Moderate Activity       This is a list of the screening recommended for you and due dates:  Health Maintenance  Topic Date Due  . Flu Shot  01/02/2020  . Eye exam for diabetics  08/01/2020  . Hemoglobin A1C  08/08/2020  . Complete foot exam   02/24/2021  . Tetanus Vaccine  12/25/2028  . COVID-19 Vaccine  Completed  .  Hepatitis C: One time screening is recommended by Center for Disease Control  (CDC) for  adults born from [birth year] through 1965.   Completed  . Pneumonia vaccines  Completed

## 2020-03-14 NOTE — Progress Notes (Signed)
MEDICARE ANNUAL WELLNESS VISIT  03/14/2020  Telephone Visit Disclaimer This Medicare AWV was conducted by telephone due to national recommendations for restrictions regarding the COVID-19 Pandemic (e.g. social distancing).  I verified, using two identifiers, that I am speaking with Joe [name] or their authorized healthcare agent. I discussed the limitations, risks, security, and privacy concerns of performing an evaluation and management service by telephone and the potential availability of an in-person appointment in the future. The patient expressed understanding and agreed to proceed.  Location of Patient: Home Location of Provider (nurse):  [facility]  Subjective:    Joe [name] is a 79 y.o. male patient of Janora Norlander, DO who had a Medicare Annual Wellness Visit today via telephone. Joe [name] is lives in nearby [facility] with his wife. They don't have any children together but he has a son from a previous relationship. 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 He enjoys baking at Christmas time, reading, and working crossword puzzles. He used to exercise with his wife on a regular basis until Covid came and now they don't do that any more. He wears hearing aids.   Patient Care Team: Janora Norlander, DO as PCP - General (Family Medicine) Irine Seal, MD (Urology) Gatha Mayer, MD (Gastroenterology) Minus Breeding, MD as Consulting Physician (Cardiology) Mincey, Neena Rhymes, MD as Consulting Physician (Ophthalmology) Lavera Guise, [facility] (Pharmacist)  Advanced Directives 03/14/2020 02/23/2019 02/17/2018 11/07/2016 05/26/2016 11/01/2015 04/06/2014  Does Patient Have a Medical Advance Directive? No No No No No Yes No  Type of Advance Directive - - - - - Press photographer;Living will -  Copy of [place] in Chart? - - - - - No - copy requested -  Would patient like information on creating a medical advance directive? No - Patient declined No - Patient declined No - Patient declined - - - [facility] Utilization Over the Past 12 Months: # of hospitalizations or ER visits: 0 # of surgeries: 0  Review of Systems    Patient reports that his overall health is unchanged compared to last year.  History obtained from chart review  Patient Reported Readings (BP, Pulse, CBG, Weight, etc) none  Pain Assessment Pain : No/denies pain     Current Medications & Allergies (verified) Allergies as of 03/14/2020      Reactions   Ace Inhibitors Cough   Trazamine [trazodone & Diet Manage Prod] Other (See Comments)   nightmares      Medication List       Accurate as of March 14, 2020  9:41 AM. If you have any questions, ask your nurse or doctor.        Accu-Chek Softclix Lancets lancets CHECK BLOOD SUGAR 4 TIMES A DAY OR AS DIRECTED   aspirin 81 MG tablet Take 81 mg by mouth daily.   Blood Glucose Monitoring Suppl w/Device Kit Test BS BID and PRN dx E11.9. Give test strips and lancets to match machine given   cholecalciferol 1000 units tablet Commonly known as: VITAMIN D Take 2,000 Units by mouth daily.   Cinnamon 500 MG Tabs Take 1,000 tablets by mouth 2 (two) times daily.   doxylamine (Sleep) 25 MG tablet Commonly known as: UNISOM Take 25 mg by mouth at bedtime as needed for sleep.  empagliflozin 25 MG Tabs tablet Commonly known as: Jardiance Take 1 tablet (25 mg total) by mouth daily.   esomeprazole 40 MG capsule Commonly known as: NEXIUM TAKE (1) CAPSULE DAILY   FreeStyle Libre 2 Reader Devi Use to test blood sugars up to 6 times daily as directed. DX: E11.9   FreeStyle Libre 2 Sensor Misc Use to test blood sugars up to 6 times daily as directed. DX: E11.9   metFORMIN 500 MG 24 hr tablet Commonly known as: GLUCOPHAGE-XR Take 2 tablets  (1,000 mg total) by mouth 2 (two) times daily with a meal.   mupirocin ointment 2 % Commonly known as: BACTROBAN Apply 1 application topically 2 (two) times daily.   olmesartan 20 MG tablet Commonly known as: BENICAR Take 1 tablet by mouth daily. STOP the Benicar HCT   OneTouch Ultra test strip Generic drug: glucose blood Check blood sugar 4 times daily Dx E 11.40   Pen Needles 32G X 5 MM Misc 1 Device by Does not apply route daily.   PROBIOTIC PO Take 1 tablet by mouth daily.   simvastatin 40 MG tablet Commonly known as: ZOCOR Take 1 tablet (40 mg total) by mouth every evening.   sitaGLIPtin 100 MG tablet Commonly known as: Januvia Take 1 tablet (100 mg total) by mouth daily.   Tyler Aas FlexTouch 100 UNIT/ML FlexTouch Pen Generic drug: insulin degludec Inject 0.06-0.2 mLs (6-20 Units total) into the skin daily at 10 pm.   triamcinolone ointment 0.5 % Commonly known as: KENALOG APPLY TO AFFECTED AREAS TWICE A DAY       History (reviewed): Past Medical History:  Diagnosis Date  . Adenomatous colon polyp   . Arthritis   . Cataract   . Chest pain at rest 05/26/2016  . Diabetes mellitus without complication ([place])   . Dyslipidemia   . ESOPHAGITIS, REFLUX 07/09/2004   Qualifier: Diagnosis of  By: Hardin Negus CMA (AAMA), [place]    . Hiatal hernia   . HOH (hard of hearing)    has bilateral Hearing aids  . Hypertension   . Irritable bowel syndrome   . Kidney stone 1970  . Pneumonia   . Tick bites    took 5 rounds of Doxycycline this summer   Past Surgical History:  Procedure Laterality Date  . COLONOSCOPY    . ESOPHAGEAL DILATION  1996  . ESOPHAGEAL DILATION  2003  . FLEXIBLE SIGMOIDOSCOPY    . HEMICOLECTOMY  08/2005   Right  . KNEE SURGERY Right   . TONSILLECTOMY     Family History  Problem Relation Age of Onset  . Stroke Father 51  . Hyperlipidemia Father   . Hypertension Father   . Heart disease Brother   . Cancer Brother        lung   .  Hyperlipidemia Brother   . Hypertension Brother   . Stroke Brother   . Diabetes Brother   . Breast cancer Mother   . Colon cancer Neg Hx   . Colon polyps Neg Hx   . Kidney disease Neg Hx   . Esophageal cancer Neg Hx   . Stomach cancer Neg Hx   . Rectal cancer Neg Hx    Social History   Socioeconomic History  . Marital status: Married    Spouse name: [name]  . Number of children: 1  . Years of education: 49  . Highest education level: Bachelor's degree (e.g., BA, AB, BS)  Occupational History  . Occupation: Retired Pharmacist, hospital  Tobacco Use  .  Smoking status: Former Smoker    Packs/day: 2.00    Types: Cigarettes    Start date: 06/03/1954    Quit date: 08/24/1984    Years since quitting: 35.5  . Smokeless tobacco: Never Used  . Tobacco comment: Has not used to tobacco products for the past 20 years  Vaping Use  . Vaping Use: Never used  Substance and Sexual Activity  . Alcohol use: Yes    Alcohol/week: 7.0 standard drinks    Types: 7 Standard drinks or equivalent per week    Comment: Bourbon at night when he sits on the deck  . Drug use: No  . Sexual activity: Yes    Partners: Female  Other Topics Concern  . 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 Difficulty of Paying Living Expenses: Not on file  Food Insecurity:   . Worried About Charity fundraiser in the Last Year: Not on file  . Ran Out of Food in the Last Year: Not on file  Transportation Needs:   . Lack of Transportation (Medical): Not on file  . Lack of Transportation (Non-Medical): Not on file  Physical Activity:   . Days of Exercise per Week: Not on file  . Minutes of Exercise per Session: Not on file  Stress:   . Feeling of Stress : Not on file  Social Connections:   .  Frequency of Communication with Friends and Family: Not on file  . Frequency of Social Gatherings with Friends and Family: Not on file  . Attends Religious Services: Not on file  . Active Member of Clubs or Organizations: Not on file  . Attends Archivist Meetings: Not on file  . Marital Status: Not on file    Activities of Daily Living In your present state of health, do you have any difficulty performing the following activities: 03/14/2020  Hearing? Y  Comment Wears hearing aids  Vision? N  Difficulty concentrating or making decisions? N  Walking or climbing stairs? N  Dressing or bathing? N  Doing errands, shopping? N  Preparing Food and eating ? N  Using the Toilet? N  In the past six months, have you accidently leaked urine? N  Do you have problems with loss of bowel control? N  Managing your Medications? N  Managing your Finances? N  Housekeeping or managing your Housekeeping? N  Some recent data might be hidden    Patient Education/ Literacy How often do you need to have someone help you when you read instructions, pamphlets, or other written materials from your doctor or pharmacy?: 1 - Never What is the last grade level you completed in school?: 4 years of college  Exercise Current Exercise Habits: The patient does not participate in regular exercise at present, Exercise limited by: None identified  Diet Patient reports consuming 3 meals a day and 2 snack(s) a day Patient reports that his primary diet is: Diabetic Patient reports that she does have regular access to food.   Depression Screen PHQ 2/9 Scores 02/25/2020 01/19/2020 11/30/2019 11/15/2019 08/13/2019 05/03/2019 04/07/2019  PHQ - 2 Score 0 0 1 0 0 0 0  PHQ- 9 Score 0 - - 0 - - -  Fall Risk Fall Risk  03/14/2020 01/19/2020 11/30/2019 11/15/2019 11/15/2019  Falls in the past year? 0 0 0 0 0  Number falls in past yr: - - - - -  Injury with Fall? - - - - -  Follow up - Falls evaluation completed - -  -  Comment - - - - -     Objective:  Joe [name] seemed alert and oriented and he participated appropriately during our telephone visit.  Blood Pressure Weight BMI  BP Readings from Last 3 Encounters:  02/25/20 125/73  01/19/20 140/73  11/30/19 134/67   Wt Readings from Last 3 Encounters:  02/25/20 170 lb (77.1 kg)  01/19/20 169 lb 4 oz (76.8 kg)  11/30/19 171 lb (77.6 kg)   BMI Readings from Last 1 Encounters:  02/25/20 25.10 kg/m    *Unable to obtain current vital signs, weight, and BMI due to telephone visit type  Hearing/Vision  . Joe [name] did not seem to have difficulty with hearing/understanding during the telephone conversation . Reports that he has had a formal eye exam by an eye care professional within the past year . Reports that he has not had a formal hearing evaluation within the past year *Unable to fully assess hearing and vision during telephone visit type  Cognitive Function: 6CIT Screen 03/14/2020 02/23/2019  What Year? 0 points 0 points  What month? 0 points 0 points  What time? 0 points 0 points  Count back from 20 0 points 0 points  Months in reverse 0 points 0 points  Repeat phrase 0 points 0 points  Total Score 0 0   (Normal:0-7, Significant for Dysfunction: >8)  Normal Cognitive Function Screening: Yes   Immunization & Health Maintenance Record Immunization History  Administered Date(s) Administered  . Fluad Quad(high Dose 65+) 03/16/2019  . Influenza, High Dose Seasonal PF 03/28/2016, 03/07/2017, 04/01/2018  . Influenza,inj,Quad PF,6+ Mos 03/23/2013, 03/26/2014, 03/17/2015  . Moderna SARS-COVID-2 Vaccination 06/21/2019, 07/19/2019  . Pneumococcal Conjugate-13 06/08/2013  . Pneumococcal Polysaccharide-23 04/03/2008  . Tdap 03/13/2011, 12/26/2018    Health Maintenance  Topic Date Due  . INFLUENZA VACCINE  01/02/2020  . OPHTHALMOLOGY EXAM  08/01/2020  . HEMOGLOBIN A1C  08/08/2020  . FOOT EXAM  02/24/2021  . TETANUS/TDAP  12/25/2028  .  COVID-19 Vaccine  Completed  . Hepatitis C Screening  Completed  . PNA vac Low Risk Adult  Completed       Assessment  This is a routine wellness examination for Joe [name].  Health Maintenance: Due or Overdue Health Maintenance Due  Topic Date Due  . INFLUENZA VACCINE  01/02/2020    Joe [name] does not need a referral for Community Assistance: Care Management:   no Social Work:    no Prescription Assistance:  no Nutrition/Diabetes Education:  no   Plan:  Personalized Goals Goals Addressed   None    Personalized Health Maintenance & Screening Recommendations  Influenza vaccine  Lung Cancer Screening Recommended: no (Low Dose CT Chest recommended if Age 30-80 years, 30 pack-year currently smoking OR have quit w/in past 15 years) Hepatitis C Screening recommended: Done 10-29-16 HIV Screening recommended: no  Advanced Directives: Written information was not prepared per patient's request.  Referrals & Orders No orders of the defined types were placed in this encounter.   Follow-up Plan . Follow-up with Janora Norlander, DO as planned . Schedule for a flu shot    I have personally reviewed and noted the following in the patient's chart:   .  Medical and social history . Use of alcohol, tobacco or illicit drugs  . Current medications and supplements . Functional ability and status . Nutritional status . Physical activity . Advanced directives . List of other physicians . Hospitalizations, surgeries, and ER visits in previous 12 months . Vitals . Screenings to include cognitive, depression, and falls . Referrals and appointments  In addition, I have reviewed and discussed with Joe [name] certain preventive protocols, quality metrics, and best practice recommendations. A written personalized care plan for preventive services as well as general preventive health recommendations is available and can be mailed to the patient at his request.      Rolena Infante LPN [id number]

## 2020-03-15 ENCOUNTER — Other Ambulatory Visit: Payer: Self-pay | Admitting: Family Medicine

## 2020-03-15 DIAGNOSIS — K219 Gastro-esophageal reflux disease without esophagitis: Secondary | ICD-10-CM

## 2020-04-05 ENCOUNTER — Telehealth: Payer: Self-pay

## 2020-04-05 NOTE — Telephone Encounter (Signed)
Pts wife called requesting to speak with Almyra Free regarding pt's sugar levels.

## 2020-04-05 NOTE — Telephone Encounter (Signed)
Reports AM lows Libre    64, fingerstick 60  69  82   90   81   80 PM  144  148  158   176  Diet has unchanged  64% 35% Patterns low in the morning avgBG 158 Goes high after AM, PM meals   Decrease tresiba to 18 units Discussed starting Rybelsus in the place of januvia; does not want meal time insulin; we need additional post-prandial control while avoiding AM lows

## 2020-04-17 ENCOUNTER — Ambulatory Visit (INDEPENDENT_AMBULATORY_CARE_PROVIDER_SITE_OTHER): Payer: Medicare PPO

## 2020-04-17 ENCOUNTER — Other Ambulatory Visit: Payer: Self-pay | Admitting: *Deleted

## 2020-04-17 ENCOUNTER — Other Ambulatory Visit: Payer: Self-pay

## 2020-04-17 DIAGNOSIS — Z23 Encounter for immunization: Secondary | ICD-10-CM | POA: Diagnosis not present

## 2020-04-17 MED ORDER — PEN NEEDLES 32G X 5 MM MISC: 1.0000 | Freq: Every day | 3 refills | Status: DC

## 2020-05-09 DIAGNOSIS — B369 Superficial mycosis, unspecified: Secondary | ICD-10-CM | POA: Diagnosis not present

## 2020-05-09 DIAGNOSIS — H938X2 Other specified disorders of left ear: Secondary | ICD-10-CM | POA: Diagnosis not present

## 2020-05-09 DIAGNOSIS — H6241 Otitis externa in other diseases classified elsewhere, right ear: Secondary | ICD-10-CM | POA: Diagnosis not present

## 2020-05-09 DIAGNOSIS — H9113 Presbycusis, bilateral: Secondary | ICD-10-CM | POA: Insufficient documentation

## 2020-05-11 ENCOUNTER — Other Ambulatory Visit: Payer: Self-pay | Admitting: Family Medicine

## 2020-05-11 DIAGNOSIS — K219 Gastro-esophageal reflux disease without esophagitis: Secondary | ICD-10-CM

## 2020-05-11 DIAGNOSIS — Z794 Long term (current) use of insulin: Secondary | ICD-10-CM

## 2020-05-11 DIAGNOSIS — E1169 Type 2 diabetes mellitus with other specified complication: Secondary | ICD-10-CM

## 2020-05-11 DIAGNOSIS — E785 Hyperlipidemia, unspecified: Secondary | ICD-10-CM

## 2020-05-11 DIAGNOSIS — E1140 Type 2 diabetes mellitus with diabetic neuropathy, unspecified: Secondary | ICD-10-CM

## 2020-05-11 DIAGNOSIS — I700 Atherosclerosis of aorta: Secondary | ICD-10-CM

## 2020-05-18 ENCOUNTER — Other Ambulatory Visit: Payer: Self-pay

## 2020-05-18 DIAGNOSIS — E1140 Type 2 diabetes mellitus with diabetic neuropathy, unspecified: Secondary | ICD-10-CM

## 2020-05-18 MED ORDER — EMPAGLIFLOZIN 25 MG PO TABS: 25.0000 mg | ORAL_TABLET | Freq: Every day | ORAL | 0 refills | Status: DC

## 2020-05-22 ENCOUNTER — Other Ambulatory Visit: Payer: Self-pay | Admitting: Family Medicine

## 2020-05-22 DIAGNOSIS — E1140 Type 2 diabetes mellitus with diabetic neuropathy, unspecified: Secondary | ICD-10-CM

## 2020-05-23 DIAGNOSIS — Z8669 Personal history of other diseases of the nervous system and sense organs: Secondary | ICD-10-CM | POA: Diagnosis not present

## 2020-05-23 DIAGNOSIS — Z09 Encounter for follow-up examination after completed treatment for conditions other than malignant neoplasm: Secondary | ICD-10-CM | POA: Diagnosis not present

## 2020-05-30 ENCOUNTER — Ambulatory Visit: Payer: Medicare PPO | Admitting: Family

## 2020-06-26 ENCOUNTER — Other Ambulatory Visit: Payer: Self-pay

## 2020-06-26 ENCOUNTER — Encounter: Payer: Self-pay | Admitting: Family Medicine

## 2020-06-26 ENCOUNTER — Ambulatory Visit: Payer: Medicare PPO | Admitting: Family Medicine

## 2020-06-26 VITALS — BP 122/88 | HR 59 | Temp 97.3°F | Ht 69.0 in | Wt 161.0 lb

## 2020-06-26 DIAGNOSIS — N183 Chronic kidney disease, stage 3 unspecified: Secondary | ICD-10-CM

## 2020-06-26 DIAGNOSIS — D696 Thrombocytopenia, unspecified: Secondary | ICD-10-CM

## 2020-06-26 DIAGNOSIS — E785 Hyperlipidemia, unspecified: Secondary | ICD-10-CM | POA: Diagnosis not present

## 2020-06-26 DIAGNOSIS — D171 Benign lipomatous neoplasm of skin and subcutaneous tissue of trunk: Secondary | ICD-10-CM | POA: Diagnosis not present

## 2020-06-26 DIAGNOSIS — I700 Atherosclerosis of aorta: Secondary | ICD-10-CM

## 2020-06-26 DIAGNOSIS — E1169 Type 2 diabetes mellitus with other specified complication: Secondary | ICD-10-CM

## 2020-06-26 DIAGNOSIS — E1159 Type 2 diabetes mellitus with other circulatory complications: Secondary | ICD-10-CM

## 2020-06-26 DIAGNOSIS — E1122 Type 2 diabetes mellitus with diabetic chronic kidney disease: Secondary | ICD-10-CM

## 2020-06-26 DIAGNOSIS — N400 Enlarged prostate without lower urinary tract symptoms: Secondary | ICD-10-CM

## 2020-06-26 DIAGNOSIS — I152 Hypertension secondary to endocrine disorders: Secondary | ICD-10-CM | POA: Diagnosis not present

## 2020-06-26 LAB — BAYER DCA HB A1C WAIVED: HB A1C (BAYER DCA - WAIVED): 8.1 % — ABNORMAL HIGH (ref <7.0)

## 2020-06-26 NOTE — Patient Instructions (Signed)
Three well balanced meals per day (protein plus vegetable; diabetic boost/ glucerna) Try and choose low carb options to prevent sugar spikes.  Goal blood sugar is around 150 or less We may consider trial of reduced metformin but I'd like you to track diarrheal stools and see if it correlates to specific foods you are eating.  Probiotic may be helpful as well.  Lesion on back looks like a lipoma.  We discussed differential includes liposarcoma (rare)   Lipoma  A lipoma is a noncancerous (benign) tumor that is made up of fat cells. This is a very common type of soft-tissue growth. Lipomas are usually found under the skin (subcutaneous). They may occur in any tissue of the body that contains fat. Common areas for lipomas to appear include the back, arms, shoulders, buttocks, and thighs. Lipomas grow slowly, and they are usually painless. Most lipomas do not cause problems and do not require treatment. What are the causes? The cause of this condition is not known. What increases the risk? You are more likely to develop this condition if:  You are 76-4 years old.  You have a family history of lipomas. What are the signs or symptoms? A lipoma usually appears as a small, round bump under the skin. In most cases, the lump will:  Feel soft or rubbery.  Not cause pain or other symptoms. However, if a lipoma is located in an area where it pushes on nerves, it can become painful or cause other symptoms. How is this diagnosed? A lipoma can usually be diagnosed with a physical exam. You may also have tests to confirm the diagnosis and to rule out other conditions. Tests may include:  Imaging tests, such as a CT scan or an MRI.  Removal of a tissue sample to be looked at under a microscope (biopsy). How is this treated? Treatment for this condition depends on the size of the lipoma and whether it is causing any symptoms.  For small lipomas that are not causing problems, no treatment is  needed.  If a lipoma is bigger or it causes problems, surgery may be done to remove the lipoma. Lipomas can also be removed to improve appearance. Most often, the procedure is done after applying a medicine that numbs the area (local anesthetic).  Liposuction may be done to reduce the size of the lipoma before it is removed through surgery, or it may be done to remove the lipoma. Lipomas are removed with this method in order to limit incision size and scarring. A liposuction tube is inserted through a small incision into the lipoma, and the contents of the lipoma are removed through the tube with suction. Follow these instructions at home:  Watch your lipoma for any changes.  Keep all follow-up visits as told by your health care provider. This is important. Contact a health care provider if:  Your lipoma becomes larger or hard.  Your lipoma becomes painful, red, or increasingly swollen. These could be signs of infection or a more serious condition. Get help right away if:  You develop tingling or numbness in an area near the lipoma. This could indicate that the lipoma is causing nerve damage. Summary  A lipoma is a noncancerous tumor that is made up of fat cells.  Most lipomas do not cause problems and do not require treatment.  If a lipoma is bigger or it causes problems, surgery may be done to remove the lipoma.  Contact a health care provider if your lipoma becomes larger  or hard, or if it becomes painful, red, or increasingly swollen. Pain, redness, and swelling could be signs of infection or a more serious condition. This information is not intended to replace advice given to you by your health care provider. Make sure you discuss any questions you have with your health care provider. Document Revised: 01/04/2019 Document Reviewed: 01/04/2019 Elsevier Patient Education  [place].

## 2020-06-26 NOTE — Progress Notes (Signed)
Subjective: CC: DM PCP: Janora Norlander, DO PRX:YVOPF Joe [name] is a 80 y.o. male presenting to clinic today for:  1. Type 2 Diabetes with hypertension, hyperlipidemia:  His blood sugars have been around 80s on average each morning.  Low has been in the 70s and high in the morning has been in the 110s.  Postprandial sugars at night range around 150s but occasionally into the 200s depending on what he has eaten.  This seems to heavily correlate with consumption of potatoes, beans and other carbs.  His wife is present with him today and admits that he has lost about 10 pound since his last visit.  He has not really changed what he eats but how frequently he eats, often skipping his lunch meals.  His breakfast typically consists of yogurt and some type of fruit.  He will skip lunch and sometimes snack in the afternoons.  Supper consists of some type of meat, vegetable and perhaps a starch.  She thinks that he is skipping his lunch meal so as to not elevate his blood sugar.  Last eye exam: Up-to-date Last foot exam: Up-to-date Last A1c:  Lab Results  Component Value Date   HGBA1C 7.2 (H) 02/09/2020   Nephropathy screen indicated?:  On ARB Last flu, zoster and/or pneumovax:  Immunization History  Administered Date(s) Administered  . Fluad Quad(high Dose 65+) 03/16/2019, 04/17/2020  . Influenza, High Dose Seasonal PF 03/28/2016, 03/07/2017, 04/01/2018  . Influenza,inj,Quad PF,6+ Mos 03/23/2013, 03/26/2014, 03/17/2015  . Moderna Sars-Covid-2 Vaccination 06/21/2019, 07/19/2019  . Pneumococcal Conjugate-13 06/08/2013  . Pneumococcal Polysaccharide-23 04/03/2008  . Tdap 03/13/2011, 12/26/2018    ROS: Denies chest pain, shortness of breath, visual disturbance.   2.  Soft tissue mass Patient has had a soft tissue mass on his left upper back.  It was first noticed shortly before the holidays.  It has not grown in size.  It does not bother him.  They had a family friend look at it and they  were told it might be a lipoma.  He denies any skin breakdown.    ROS: Per HPI  Allergies  Allergen Reactions  . Ace Inhibitors Cough  . Trazamine [Trazodone & Diet Manage Prod] Other (See Comments)    nightmares   Past Medical History:  Diagnosis Date  . Adenomatous colon polyp   . Arthritis   . Cataract   . Chest pain at rest 05/26/2016  . Diabetes mellitus without complication ([place])   . Dyslipidemia   . ESOPHAGITIS, REFLUX 07/09/2004   Qualifier: Diagnosis of  By: [place] Negus CMA (AAMA), [place]    . Hiatal hernia   . HOH (hard of hearing)    has bilateral Hearing aids  . Hypertension   . Irritable bowel syndrome   . Kidney stone 1970  . Pneumonia   . Tick bites    took 5 rounds of Doxycycline this summer    Current Outpatient Medications:  .  Accu-Chek Softclix Lancets lancets, CHECK BLOOD SUGAR 4 TIMES A DAY OR AS DIRECTED, Disp: , Rfl:  .  aspirin 81 MG tablet, Take 81 mg by mouth daily.  , Disp: , Rfl:  .  Blood Glucose Monitoring Suppl w/Device KIT, Test BS BID and PRN dx E11.9. Give test strips and lancets to match machine given, Disp: 1 each, Rfl: 1 .  cholecalciferol (VITAMIN D) 1000 UNITS tablet, Take 2,000 Units by mouth daily. , Disp: , Rfl:  .  Cinnamon 500 MG TABS, Take 1,000 tablets by  mouth 2 (two) times daily., Disp: , Rfl:  .  Continuous Blood Gluc Receiver (FREESTYLE LIBRE 2 READER) DEVI, Use to test blood sugars up to 6 times daily as directed. DX: E11.9, Disp: 1 each, Rfl: 0 .  Continuous Blood Gluc Sensor (FREESTYLE LIBRE 2 SENSOR) MISC, Use to test blood sugars up to 6 times daily as directed. DX: E11.9, Disp: 2 each, Rfl: 6 .  doxylamine, Sleep, (UNISOM) 25 MG tablet, Take 25 mg by mouth at bedtime as needed for sleep. , Disp: , Rfl:  .  empagliflozin (JARDIANCE) 25 MG TABS tablet, Take 1 tablet (25 mg total) by mouth daily., Disp: 90 tablet, Rfl: 0 .  esomeprazole (NEXIUM) 40 MG capsule, TAKE (1) CAPSULE DAILY, Disp: 30 capsule, Rfl: 0 .  glucose  blood (ONETOUCH ULTRA) test strip, Check blood sugar 4 times daily Dx E 11.40, Disp: 200 strip, Rfl: 11 .  insulin degludec (TRESIBA FLEXTOUCH) 100 UNIT/ML FlexTouch Pen, Inject 0.06-0.2 mLs (6-20 Units total) into the skin daily at 10 pm., Disp: 15 mL, Rfl: 3 .  Insulin Pen Needle (PEN NEEDLES) 32G X 5 MM MISC, 1 Device by Does not apply route daily., Disp: 100 each, Rfl: 3 .  JANUVIA 100 MG tablet, TAKE 1 TABLET DAILY, Disp: 90 tablet, Rfl: 0 .  metFORMIN (GLUCOPHAGE-XR) 500 MG 24 hr tablet, TAKE 2 TABLETS 2 TIMES A DAY WITH MEALS, Disp: 360 tablet, Rfl: 0 .  mupirocin ointment (BACTROBAN) 2 %, Apply 1 application topically 2 (two) times daily., Disp: 22 g, Rfl: 1 .  olmesartan (BENICAR) 20 MG tablet, TAKE 1 TABLET ONCE DAILY, Disp: 90 tablet, Rfl: 0 .  Probiotic Product (PROBIOTIC PO), Take 1 tablet by mouth daily., Disp: , Rfl:  .  simvastatin (ZOCOR) 40 MG tablet, TAKE 1 TABLET ONCE DAILY IN THE EVENING, Disp: 90 tablet, Rfl: 0 .  triamcinolone ointment (KENALOG) 0.5 %, APPLY TO AFFECTED AREAS TWICE A DAY, Disp: 30 g, Rfl: 2 Social History   Socioeconomic History  . Marital status: Married    Spouse name: [name]  . Number of children: 1  . Years of education: 27  . Highest education level: Bachelor's degree (e.g., BA, AB, BS)  Occupational History  . Occupation: Retired Pharmacist, hospital  Tobacco Use  . Smoking status: Former Smoker    Packs/day: 2.00    Types: Cigarettes    Start date: 06/03/1954    Quit date: 08/24/1984    Years since quitting: 35.8  . Smokeless tobacco: Never Used  . Tobacco comment: Has not used to tobacco products for the past 20 years  Vaping Use  . Vaping Use: Never used  Substance and Sexual Activity  . Alcohol use: Yes    Alcohol/week: 7.0 standard drinks    Types: 7 Standard drinks or equivalent per week    Comment: Bourbon at night when he sits on the deck  . Drug use: No  . Sexual activity: Yes    Partners: Female  Other Topics Concern  . 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 Stroke Father 71  . Hyperlipidemia Father   . Hypertension Father   . Heart disease Brother   . Cancer Brother        lung   . Hyperlipidemia Brother   . Hypertension Brother   . Stroke Brother   . Diabetes Brother   . Breast cancer Mother   . Colon cancer Neg Hx   . Colon polyps Neg Hx   . Kidney disease Neg Hx   . Esophageal cancer Neg Hx   . Stomach cancer Neg Hx   . Rectal cancer Neg Hx     Objective: Office vital signs reviewed. BP 122/88   Pulse (!) 59   Temp (!) 97.3 F (36.3 C) (Temporal)   Ht 5' 9"  (1.753 m)   Wt 161 lb (73 kg)   SpO2 100%   BMI 23.78 kg/m   Physical Examination:  General: Awake, alert, well appearing, No acute distress HEENT: Normal, sclera white, MMM Cardio: regular rate and rhythm, S1S2 heard, no murmurs appreciated Pulm: clear to auscultation bilaterally, no wheezes, rhonchi or rales; normal work of breathing on room air Extremities: warm, well perfused, No edema, cyanosis or clubbing; +2 pulses bilaterally MSK: normal gait and station Skin: 2-1/2 inch x 2-1/2 well-circumscribed, mobile, rubbery mass consistent with a lipoma noted in the left upper back just adjacent to the paraspinal muscles.  There is no associated skin breakdown, erythema or warmth; no appreciable punctum Assessment/ Plan: 80 y.o. male   Diabetes mellitus with stage 3 chronic kidney disease ([facility]) - Plan: Bayer DCA Hb A1c Waived  Hyperlipidemia  associated with type 2 diabetes mellitus ([facility]) - Plan: CMP14+EGFR, Lipid Panel  Hypertension associated with diabetes ([place] [facility]) - Plan: CMP14+EGFR  Abdominal aortic atherosclerosis ([place]) - Plan: CMP14+EGFR, Lipid Panel  Thrombocytopenia ([place]) - Plan: CBC with Differential  Benign prostatic hyperplasia, unspecified whether lower urinary tract symptoms present - Plan: PSA  Lipoma of torso  Blood sugar is not at goal.  A1c is 8.1 today.  This is a large jump from his check last visit.  I do worry that the content of food is causing this jump in blood sugar.  We discussed the catabolic nature of uncontrolled blood sugar on weight.  I do think that the combination of elevated blood sugars and skipping lunch is likely what is causing his weight loss.  However, we discussed that we would check for other metabolic etiology. I do think there would be pertinent it of his blood sugar does not become controlled with modification of food content we discuss mealtime insulin. I have encouraged him to eat protein and vegetables with each meal.  Would avoid simple sugars, starches if possible. Okay to supplement with Glucerna or similar  We will see him back in the next 3 to 4 weeks for recheck of his weight and blood sugars.  Lesion on back does appear to be a lipoma.  We discussed the possible differential diagnosis including liposarcoma I think this to be a much less likely.  Discussed red flag signs symptoms warranting further evaluation.  He voiced good understanding  No orders of the defined types were placed in this encounter.  No orders of the defined types were placed in this encounter.    Janora Norlander, DO [place] [phone]

## 2020-06-27 LAB — CBC WITH DIFFERENTIAL/PLATELET
Basophils Absolute: 0.1 10*3/uL (ref 0.0–0.2)
Basos: 2 %
EOS (ABSOLUTE): 0.5 10*3/uL — ABNORMAL HIGH (ref 0.0–0.4)
Eos: 9 %
Hematocrit: 35.7 % — ABNORMAL LOW (ref 37.5–51.0)
Hemoglobin: 12.3 g/dL — ABNORMAL LOW (ref 13.0–17.7)
Immature Grans (Abs): 0 10*3/uL (ref 0.0–0.1)
Immature Granulocytes: 0 %
Lymphocytes Absolute: 1.3 10*3/uL (ref 0.7–3.1)
Lymphs: 22 %
MCH: 31.4 pg (ref 26.6–33.0)
MCHC: 34.5 g/dL (ref 31.5–35.7)
MCV: 91 fL (ref 79–97)
Monocytes Absolute: 0.6 10*3/uL (ref 0.1–0.9)
Monocytes: 10 %
Neutrophils Absolute: 3.4 10*3/uL (ref 1.4–7.0)
Neutrophils: 57 %
Platelets: 139 10*3/uL — ABNORMAL LOW (ref 150–450)
RBC: 3.92 x10E6/uL — ABNORMAL LOW (ref 4.14–5.80)
RDW: 12.1 % (ref 11.6–15.4)
WBC: 5.8 10*3/uL (ref 3.4–10.8)

## 2020-06-27 LAB — CMP14+EGFR
ALT: 12 IU/L (ref 0–44)
AST: 15 IU/L (ref 0–40)
Albumin/Globulin Ratio: 2.1 (ref 1.2–2.2)
Albumin: 4.2 g/dL (ref 3.7–4.7)
Alkaline Phosphatase: 72 IU/L (ref 44–121)
BUN/Creatinine Ratio: 13 (ref 10–24)
BUN: 16 mg/dL (ref 8–27)
Bilirubin Total: 0.6 mg/dL (ref 0.0–1.2)
CO2: 22 mmol/L (ref 20–29)
Calcium: 8.9 mg/dL (ref 8.6–10.2)
Chloride: 101 mmol/L (ref 96–106)
Creatinine, Ser: 1.28 mg/dL — ABNORMAL HIGH (ref 0.76–1.27)
GFR calc Af Amer: 61 mL/min/{1.73_m2} (ref >59)
GFR calc non Af Amer: 53 mL/min/{1.73_m2} — ABNORMAL LOW (ref >59)
Globulin, Total: 2 g/dL (ref 1.5–4.5)
Glucose: 102 mg/dL — ABNORMAL HIGH (ref 65–99)
Potassium: 3.9 mmol/L (ref 3.5–5.2)
Sodium: 137 mmol/L (ref 134–144)
Total Protein: 6.2 g/dL (ref 6.0–8.5)

## 2020-06-27 LAB — LIPID PANEL
Chol/HDL Ratio: 2.8 ratio (ref 0.0–5.0)
Cholesterol, Total: 108 mg/dL (ref 100–199)
HDL: 39 mg/dL — ABNORMAL LOW (ref >39)
LDL Chol Calc (NIH): 48 mg/dL (ref 0–99)
Triglycerides: 114 mg/dL (ref 0–149)
VLDL Cholesterol Cal: 21 mg/dL (ref 5–40)

## 2020-06-27 LAB — PSA: Prostate Specific Ag, Serum: 3 ng/mL (ref 0.0–4.0)

## 2020-07-03 ENCOUNTER — Other Ambulatory Visit: Payer: Self-pay

## 2020-07-03 ENCOUNTER — Other Ambulatory Visit: Payer: Medicare PPO

## 2020-07-03 DIAGNOSIS — Z1211 Encounter for screening for malignant neoplasm of colon: Secondary | ICD-10-CM

## 2020-07-04 LAB — FECAL OCCULT BLOOD, IMMUNOCHEMICAL: Fecal Occult Bld: NEGATIVE

## 2020-07-10 ENCOUNTER — Other Ambulatory Visit: Payer: Self-pay | Admitting: Family Medicine

## 2020-07-10 DIAGNOSIS — K219 Gastro-esophageal reflux disease without esophagitis: Secondary | ICD-10-CM

## 2020-07-25 ENCOUNTER — Encounter: Payer: Self-pay | Admitting: Family Medicine

## 2020-07-25 ENCOUNTER — Ambulatory Visit: Payer: Medicare PPO | Admitting: Family Medicine

## 2020-07-25 ENCOUNTER — Other Ambulatory Visit: Payer: Self-pay

## 2020-07-25 VITALS — BP 107/51 | HR 68 | Temp 97.3°F | Ht 69.0 in | Wt 164.0 lb

## 2020-07-25 DIAGNOSIS — R634 Abnormal weight loss: Secondary | ICD-10-CM | POA: Diagnosis not present

## 2020-07-25 DIAGNOSIS — D649 Anemia, unspecified: Secondary | ICD-10-CM

## 2020-07-25 LAB — HEMOGLOBIN, FINGERSTICK: Hemoglobin: 11.8 g/dL — ABNORMAL LOW (ref 12.6–17.7)

## 2020-07-25 NOTE — Progress Notes (Signed)
Subjective: CC: Unplanned weight loss, uncontrolled diabetes PCP: Janora Norlander, DO WVP:XTGGY DAVEON ARPINO is a 80 y.o. male presenting to clinic today for:  1.  Unplanned weight loss Patient was seen 1 month ago and at that time he was reporting blood sugar fluctuations such that he would skip lunch and sometimes snacks in the afternoon in efforts to control blood sugar.  He had subsequently lost some weight.  There was concern that hypoglycemia may be causing a catabolic reaction and leading to weight loss.  He had eval for metabolic etiology which was fairly unremarkable.  He was noted to have slight anemia and FOBT was obtained but this was negative.  He was advised to reduce carbohydrate consumption but increase protein.  He is here for recheck.  He voices that he has been doing much better than last visit.  He has been eating regular scheduled meals and documenting these in a diary.  His blood sugars have fluctuated from 60s in the morning to 130s.  They vary in the evening from 110s to 200s depending on what he eats for supper.  He will drink juice if they are in the low 100s prior to bed.  He is compliant with his medication regimen.  He has not seen any blood in stool.  He denies any night sweats.  Overall he has been feeling well.  His wife on the other hand is quite concerned about his state of health and is very tearful during today's visit.  She has questions today about Metformin inducing an anemia.  ROS: Per HPI  Allergies  Allergen Reactions   Ace Inhibitors Cough   Trazamine [Trazodone & Diet Manage Prod] Other (See Comments)    nightmares   Past Medical History:  Diagnosis Date   Adenomatous colon polyp    Arthritis    Cataract    Chest pain at rest 05/26/2016   Diabetes mellitus without complication ([facility])    Dyslipidemia    ESOPHAGITIS, REFLUX 07/09/2004   Qualifier: Diagnosis of  By: [place] Negus CMA Deborra Medina), [place]     Hiatal hernia    HOH (hard of  hearing)    has bilateral Hearing aids   Hypertension    Irritable bowel syndrome    Kidney stone 1970   Pneumonia    Tick bites    took 5 rounds of Doxycycline this summer    Current Outpatient Medications:    Accu-Chek Softclix Lancets lancets, CHECK BLOOD SUGAR 4 TIMES A DAY OR AS DIRECTED, Disp: , Rfl:    aspirin 81 MG tablet, Take 81 mg by mouth daily., Disp: , Rfl:    Blood Glucose Monitoring Suppl w/Device KIT, Test BS BID and PRN dx E11.9. Give test strips and lancets to match machine given, Disp: 1 each, Rfl: 1   cholecalciferol (VITAMIN D) 1000 UNITS tablet, Take 2,000 Units by mouth daily., Disp: , Rfl:    Cinnamon 500 MG TABS, Take 1,000 tablets by mouth 2 (two) times daily., Disp: , Rfl:    Continuous Blood Gluc Receiver (FREESTYLE LIBRE 2 READER) DEVI, Use to test blood sugars up to 6 times daily as directed. DX: E11.9, Disp: 1 each, Rfl: 0   Continuous Blood Gluc Sensor (FREESTYLE LIBRE 2 SENSOR) MISC, Use to test blood sugars up to 6 times daily as directed. DX: E11.9, Disp: 2 each, Rfl: 6   doxylamine, Sleep, (UNISOM) 25 MG tablet, Take 25 mg by mouth at bedtime as needed for sleep. , Disp: , Rfl:  empagliflozin (JARDIANCE) 25 MG TABS tablet, Take 1 tablet (25 mg total) by mouth daily., Disp: 90 tablet, Rfl: 0   esomeprazole (NEXIUM) 40 MG capsule, TAKE (1) CAPSULE DAILY, Disp: 30 capsule, Rfl: 0   glucose blood (ONETOUCH ULTRA) test strip, Check blood sugar 4 times daily Dx E 11.40, Disp: 200 strip, Rfl: 11   insulin degludec (TRESIBA FLEXTOUCH) 100 UNIT/ML FlexTouch Pen, Inject 0.06-0.2 mLs (6-20 Units total) into the skin daily at 10 pm., Disp: 15 mL, Rfl: 3   Insulin Pen Needle (PEN NEEDLES) 32G X 5 MM MISC, 1 Device by Does not apply route daily., Disp: 100 each, Rfl: 3   JANUVIA 100 MG tablet, TAKE 1 TABLET DAILY, Disp: 90 tablet, Rfl: 0   metFORMIN (GLUCOPHAGE-XR) 500 MG 24 hr tablet, TAKE 2 TABLETS 2 TIMES A DAY WITH MEALS, Disp: 360 tablet,  Rfl: 0   mupirocin ointment (BACTROBAN) 2 %, Apply 1 application topically 2 (two) times daily., Disp: 22 g, Rfl: 1   olmesartan (BENICAR) 20 MG tablet, TAKE 1 TABLET ONCE DAILY, Disp: 90 tablet, Rfl: 0   Probiotic Product (PROBIOTIC PO), Take 1 tablet by mouth daily., Disp: , Rfl:    simvastatin (ZOCOR) 40 MG tablet, TAKE 1 TABLET ONCE DAILY IN THE EVENING, Disp: 90 tablet, Rfl: 0   triamcinolone ointment (KENALOG) 0.5 %, APPLY TO AFFECTED AREAS TWICE A DAY, Disp: 30 g, Rfl: 2 Social History   Socioeconomic History   Marital status: Married    Spouse name: [name]   Number of children: 1   Years of education: 16   Highest education level: Bachelor's degree (e.g., BA, AB, BS)  Occupational History   Occupation: Retired Pharmacist, hospital  Tobacco Use   Smoking status: Former Smoker    Packs/day: 2.00    Types: Cigarettes    Start date: 06/03/1954    Quit date: 08/24/1984    Years since quitting: 35.9   Smokeless tobacco: Never Used   Tobacco comment: Has not used to tobacco products for the past 20 years  Vaping Use   Vaping Use: Never used  Substance and Sexual Activity   Alcohol use: Yes    Alcohol/week: 7.0 standard drinks    Types: 7 Standard drinks or equivalent per week    Comment: Bourbon at night when he sits on the deck   Drug use: No   Sexual activity: Yes    Partners: Female  Other Topics Concern   Not on file  Social History Narrative   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, both retired teachers   1 son   3 caffeinated beverages daily   1 alcoholic beverage daily   Former smoker no current tobacco no drug use   Social Determinants of Radio broadcast assistant Strain: Not on file  Food Insecurity: Not on file  Transportation Needs: Not on file  Physical Activity: Not on file  Stress: Not on file  Social Connections: Not on file  Intimate Partner Violence: Not on file   Family History  Problem Relation Age of Onset   Stroke Father 53   Hyperlipidemia  Father    Hypertension Father    Heart disease Brother    Cancer Brother        lung    Hyperlipidemia Brother    Hypertension Brother    Stroke Brother    Diabetes Brother    Breast cancer Mother    Colon cancer Neg Hx    Colon polyps Neg Hx  Kidney disease Neg Hx    Esophageal cancer Neg Hx    Stomach cancer Neg Hx    Rectal cancer Neg Hx     Objective: Office vital signs reviewed. BP (!) 107/51    Pulse 68    Temp (!) 97.3 F (36.3 C) (Temporal)    Ht _0  (1.753 m)    Wt 164 lb (74.4 kg)    SpO2 96%    BMI 24.22 kg/m   Physical Examination:  General: Awake, alert, well nourished, No acute distress HEENT: Normal; sclera white.  Moist mucous membranes Cardio: regular rate and rhythm, S1S2 heard, no murmurs appreciated Pulm: clear to auscultation bilaterally, no wheezes, rhonchi or rales; normal work of breathing on room air Neuro: No tremor  Assessment/ Plan: 80 y.o. male   Unintended weight loss - Plan: Ambulatory referral to Gastroenterology  Anemia, unspecified type - Plan: Anemia panel, Hemoglobin, fingerstick, Ambulatory referral to Gastroenterology  He has gained 3 pounds in the last month on our scale and 5 pounds on his scale.  I reviewed his meals each day, which were presented as a food diary today.  He has had a couple of hypoglycemic episodes into the 60s and he does feel symptomatic during those times.  For this reason I have advised him to reduce his insulin to 19 units at bedtime and continue monitoring closely.  We rechecked his hemoglobin today and it continues to downtrend which does worry me.  He had a colonoscopy and EGD done in the last couple of years but given this downtrend and weight fluctuation I recommended that he see his gastroenterologist.  May need to consider imaging if their work-up is unremarkable.  I have sent off an anemia panel as well.  His wife is quite nervous about his fluctuating health.  While I do find that his  weight gain is reassuring, we should rule out any other etiologies of his weight fluctuations and worsening hemoglobin.  Of note he does have CKD 3 which may be impacting   Orders Placed This Encounter  Procedures   Anemia panel   Hemoglobin, fingerstick   No orders of the defined types were placed in this encounter.    Janora Norlander, DO [place] [phone]

## 2020-07-26 LAB — ANEMIA PANEL
Ferritin: 63 ng/mL (ref 30–400)
Folate, Hemolysate: 498 ng/mL
Folate, RBC: 1383 ng/mL (ref >498)
Hematocrit: 36 % — ABNORMAL LOW (ref 37.5–51.0)
Iron Saturation: 30 % (ref 15–55)
Iron: 77 ug/dL (ref 38–169)
Retic Ct Pct: 1.5 % (ref 0.6–2.6)
Total Iron Binding Capacity: 260 ug/dL (ref 250–450)
UIBC: 183 ug/dL (ref 111–343)
Vitamin B-12: 533 pg/mL (ref 232–1245)

## 2020-08-01 ENCOUNTER — Other Ambulatory Visit: Payer: Self-pay | Admitting: Family Medicine

## 2020-08-09 ENCOUNTER — Other Ambulatory Visit: Payer: Self-pay | Admitting: Family Medicine

## 2020-08-09 DIAGNOSIS — E1140 Type 2 diabetes mellitus with diabetic neuropathy, unspecified: Secondary | ICD-10-CM

## 2020-08-09 DIAGNOSIS — E1169 Type 2 diabetes mellitus with other specified complication: Secondary | ICD-10-CM

## 2020-08-09 DIAGNOSIS — I700 Atherosclerosis of aorta: Secondary | ICD-10-CM

## 2020-08-09 DIAGNOSIS — K219 Gastro-esophageal reflux disease without esophagitis: Secondary | ICD-10-CM

## 2020-08-14 DIAGNOSIS — S61012A Laceration without foreign body of left thumb without damage to nail, initial encounter: Secondary | ICD-10-CM | POA: Diagnosis not present

## 2020-08-15 ENCOUNTER — Other Ambulatory Visit: Payer: Self-pay

## 2020-08-15 ENCOUNTER — Ambulatory Visit: Payer: Medicare PPO | Admitting: Family Medicine

## 2020-08-15 ENCOUNTER — Encounter: Payer: Self-pay | Admitting: Family Medicine

## 2020-08-15 VITALS — BP 134/69 | HR 58 | Temp 97.7°F | Ht 69.0 in | Wt 166.0 lb

## 2020-08-15 DIAGNOSIS — S61012A Laceration without foreign body of left thumb without damage to nail, initial encounter: Secondary | ICD-10-CM | POA: Diagnosis not present

## 2020-08-15 DIAGNOSIS — D649 Anemia, unspecified: Secondary | ICD-10-CM

## 2020-08-15 DIAGNOSIS — R634 Abnormal weight loss: Secondary | ICD-10-CM | POA: Diagnosis not present

## 2020-08-15 DIAGNOSIS — S61012D Laceration without foreign body of left thumb without damage to nail, subsequent encounter: Secondary | ICD-10-CM

## 2020-08-15 LAB — HEMOGLOBIN, FINGERSTICK: Hemoglobin: 10.4 g/dL — ABNORMAL LOW (ref 12.6–17.7)

## 2020-08-15 NOTE — Progress Notes (Signed)
Subjective: CC: weight loss/ anemia PCP: Janora Norlander, DO Joe [name] is a 80 y.o. male presenting to clinic today for:  1.  Weight Patient has gained another 2 pounds since our visit 3 weeks ago.  His appetite is good.  He has been eating scheduled meals.  Blood sugars have been under fairly good control and he is pleased with this.  He notes that he did have a very brief episode of blood sugars below 70 during the night but this self corrected and he woke up with normal blood sugar the following morning.  2.  Anemia Patient has had hemoglobin levels anywhere from 11.9-13's.  This seems to be his baseline.  He denies any hematochezia, melena.  Colonoscopy last year was normal.  He has an appoint with Dr. Carlean Purl next month.  He does report having lacerated his left thumb yesterday afternoon on a saw and subsequently had to seek emergent care for stitches.  These were placed yesterday and he has an appointment with [facility] to get these removed at the appropriate interval.  He does note that it bled quite a bit yesterday.  To his knowledge the finger is hemostatic.  He was given dressings to use but has not changed the dressing yet.  Not on any oral antibiotics.  He is up-to-date on his tetanus shot   ROS: Per HPI  Allergies  Allergen Reactions  . Ace Inhibitors Cough  . Trazamine [Trazodone & Diet Manage Prod] Other (See Comments)    nightmares   Past Medical History:  Diagnosis Date  . Adenomatous colon polyp   . Arthritis   . Cataract   . Chest pain at rest 05/26/2016  . Diabetes mellitus without complication ([place])   . Dyslipidemia   . ESOPHAGITIS, REFLUX 07/09/2004   Qualifier: Diagnosis of  By: Hardin Negus CMA (AAMA), [place]    . Hiatal hernia   . HOH (hard of hearing)    has bilateral Hearing aids  . Hypertension   . Irritable bowel syndrome   . Kidney stone 1970  . Pneumonia   . Tick bites    took 5 rounds of Doxycycline this summer    Current  Outpatient Medications:  .  Accu-Chek Softclix Lancets lancets, CHECK BLOOD SUGAR 4 TIMES A DAY OR AS DIRECTED, Disp: , Rfl:  .  aspirin 81 MG tablet, Take 81 mg by mouth daily., Disp: , Rfl:  .  Blood Glucose Monitoring Suppl w/Device KIT, Test BS BID and PRN dx E11.9. Give test strips and lancets to match machine given, Disp: 1 each, Rfl: 1 .  cholecalciferol (VITAMIN D) 1000 UNITS tablet, Take 2,000 Units by mouth daily., Disp: , Rfl:  .  Cinnamon 500 MG TABS, Take 1,000 tablets by mouth 2 (two) times daily., Disp: , Rfl:  .  Continuous Blood Gluc Receiver (FREESTYLE LIBRE 2 READER) DEVI, Use to test blood sugars up to 6 times daily as directed. DX: E11.9, Disp: 1 each, Rfl: 0 .  Continuous Blood Gluc Sensor (FREESTYLE LIBRE 2 SENSOR) MISC, USE TO CHECK BLOOD SUGAR UP TO 6 TIMES DAILY AS DIRECTED, Disp: 2 each, Rfl: 1 .  doxylamine, Sleep, (UNISOM) 25 MG tablet, Take 25 mg by mouth at bedtime as needed for sleep. , Disp: , Rfl:  .  empagliflozin (JARDIANCE) 25 MG TABS tablet, Take 1 tablet (25 mg total) by mouth daily., Disp: 90 tablet, Rfl: 0 .  esomeprazole (NEXIUM) 40 MG capsule, TAKE (1) CAPSULE DAILY, Disp: 30  capsule, Rfl: 0 .  glucose blood (ONETOUCH ULTRA) test strip, Check blood sugar 4 times daily Dx E 11.40, Disp: 200 strip, Rfl: 11 .  insulin degludec (TRESIBA FLEXTOUCH) 100 UNIT/ML FlexTouch Pen, Inject 0.06-0.2 mLs (6-20 Units total) into the skin daily at 10 pm., Disp: 15 mL, Rfl: 3 .  Insulin Pen Needle (PEN NEEDLES) 32G X 5 MM MISC, 1 Device by Does not apply route daily., Disp: 100 each, Rfl: 3 .  JANUVIA 100 MG tablet, TAKE 1 TABLET DAILY, Disp: 90 tablet, Rfl: 0 .  metFORMIN (GLUCOPHAGE-XR) 500 MG 24 hr tablet, TAKE 2 TABLETS 2 TIMES A DAY WITH MEALS, Disp: 360 tablet, Rfl: 0 .  mupirocin ointment (BACTROBAN) 2 %, Apply 1 application topically 2 (two) times daily., Disp: 22 g, Rfl: 1 .  olmesartan (BENICAR) 20 MG tablet, TAKE 1 TABLET ONCE DAILY, Disp: 90 tablet, Rfl: 0 .   Probiotic Product (PROBIOTIC PO), Take 1 tablet by mouth daily., Disp: , Rfl:  .  simvastatin (ZOCOR) 40 MG tablet, TAKE 1 TABLET ONCE DAILY IN THE EVENING, Disp: 90 tablet, Rfl: 0 .  triamcinolone ointment (KENALOG) 0.5 %, APPLY TO AFFECTED AREAS TWICE A DAY, Disp: 30 g, Rfl: 2 Social History   Socioeconomic History  . Marital status: Married    Spouse name: [name]  . Number of children: 1  . Years of education: 34  . Highest education level: Bachelor's degree (e.g., BA, AB, BS)  Occupational History  . Occupation: Retired Pharmacist, hospital  Tobacco Use  . Smoking status: Former Smoker    Packs/day: 2.00    Types: Cigarettes    Start date: 06/03/1954    Quit date: 08/24/1984    Years since quitting: 36.0  . Smokeless tobacco: Never Used  . Tobacco comment: Has not used to tobacco products for the past 20 years  Vaping Use  . Vaping Use: Never used  Substance and Sexual Activity  . Alcohol use: Yes    Alcohol/week: 7.0 standard drinks    Types: 7 Standard drinks or equivalent per week    Comment: Bourbon at night when he sits on the deck  . Drug use: No  . Sexual activity: Yes    Partners: Female  Other Topics Concern  . 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 Stroke Father 56  . Hyperlipidemia Father   . Hypertension Father   . Heart disease Brother   . Cancer Brother        lung   . Hyperlipidemia Brother   . Hypertension Brother   . Stroke Brother   . Diabetes Brother   . Breast cancer  Mother   . Colon cancer Neg Hx   . Colon polyps Neg Hx   . Kidney disease Neg Hx   . Esophageal cancer Neg Hx   . Stomach cancer Neg Hx   . Rectal cancer Neg Hx     Objective: Office vital signs  reviewed. BP 134/69   Pulse (!) 58   Temp 97.7 F (36.5 C) (Temporal)   Ht _0  (1.753 m)   Wt 166 lb (75.3 kg)   SpO2 97%   BMI 24.51 kg/m   Physical Examination:  General: Awake, alert, well appearing male, No acute distress Cardio: regular rate  Pulm: Normal work of breathing on room air Skin: Irregular curvilinear laceration sustained to the left palmar aspect of the thumb.  No apparent nail involvement.  Bleed appears to be hemostatic.  There are 2 visible blue sutures noted at the apex.  No appreciable exudates.  Assessment/ Plan: 80 y.o. male   Unintended weight loss - Plan: Hemoglobin, fingerstick  Anemia, unspecified type - Plan: Hemoglobin, fingerstick, Hemoglobin, fingerstick  Laceration of left thumb without foreign body without damage to nail, subsequent encounter - Plan: Hemoglobin, fingerstick  He is gaining his weight back and is up another 2 pounds.  I am less concerned now about his weight since he has been consistently gaining and seems to be eating without difficulty.  I do think that the initial weight loss was likely related to changes in eating habits in the setting of uncontrolled blood sugar  His hemoglobin did drop down to 10.4 but I have a high suspicion this is due to the very obvious laceration he sustained to the left thumb.  I have placed future orders for hemoglobin in 2 weeks.  As long as he is stable/improving, no emergent evaluation is required.  However, if it continues to downtrend I will reach out to his GI doctor to see him sooner.  Laceration appeared quite severe but is status post repair with at least 2 visible stitches noted.  Apparently did not affect the bone so hand surgery was not consulted.  There is no evidence of secondary bacterial  infection.  There was dried blood on the bandage but no active oozing.  The lesion was cleaned lightly, triple antibiotic ointment was applied and the lesion was rewrapped and a pressure bandage.  Home care instructions were reviewed with the patient.  Reasons for reevaluation discussed.  Both he and his wife voiced good understanding.  Orders Placed This Encounter  Procedures  . Hemoglobin, fingerstick   No orders of the defined types were placed in this encounter.    Janora Norlander, DO [place] [phone]

## 2020-08-28 ENCOUNTER — Telehealth: Payer: Self-pay

## 2020-08-28 ENCOUNTER — Other Ambulatory Visit: Payer: Self-pay

## 2020-08-28 ENCOUNTER — Other Ambulatory Visit: Payer: Medicare PPO

## 2020-08-28 DIAGNOSIS — D649 Anemia, unspecified: Secondary | ICD-10-CM | POA: Diagnosis not present

## 2020-08-28 DIAGNOSIS — S61012D Laceration without foreign body of left thumb without damage to nail, subsequent encounter: Secondary | ICD-10-CM

## 2020-08-28 LAB — HEMOGLOBIN, FINGERSTICK: Hemoglobin: 12 g/dL — ABNORMAL LOW (ref 12.6–17.7)

## 2020-08-28 NOTE — Telephone Encounter (Signed)
Joe [name] pt spouse--is asking to talk to [name] or [name] please call back

## 2020-08-28 NOTE — Telephone Encounter (Signed)
Spoke to [place] (wife) and notified she states that his weight has been stable and she will talk with him appointment rather he wants to wait or not

## 2020-08-28 NOTE — Telephone Encounter (Signed)
Could consider holding off if his weight is stable.

## 2020-08-28 NOTE — Telephone Encounter (Signed)
Patients wife wants to know if he still needs to see Dr. Carlean Purl now that his Hgb is a 12.0

## 2020-09-08 ENCOUNTER — Other Ambulatory Visit: Payer: Self-pay | Admitting: Family Medicine

## 2020-09-11 ENCOUNTER — Telehealth: Payer: Self-pay

## 2020-09-11 ENCOUNTER — Encounter: Payer: Self-pay | Admitting: *Deleted

## 2020-09-11 MED ORDER — FREESTYLE LIBRE 2 SENSOR MISC: 11 refills | Status: DC

## 2020-09-11 NOTE — Telephone Encounter (Signed)
Sent - pt aware  

## 2020-09-12 ENCOUNTER — Other Ambulatory Visit: Payer: Self-pay

## 2020-09-12 ENCOUNTER — Ambulatory Visit: Payer: Medicare PPO | Admitting: Internal Medicine

## 2020-09-12 MED ORDER — FREESTYLE LIBRE 2 SENSOR MISC: 11 refills | Status: DC

## 2020-09-12 NOTE — Telephone Encounter (Signed)
Libre sent to [facility]

## 2020-09-12 NOTE — Telephone Encounter (Signed)
Wife said that the [place] needs to go to [facility]

## 2020-09-26 ENCOUNTER — Ambulatory Visit: Payer: Medicare PPO | Admitting: Family Medicine

## 2020-09-26 ENCOUNTER — Encounter: Payer: Self-pay | Admitting: Family Medicine

## 2020-09-26 ENCOUNTER — Other Ambulatory Visit: Payer: Self-pay

## 2020-09-26 VITALS — BP 128/70 | HR 57 | Temp 97.5°F | Ht 69.0 in | Wt 164.4 lb

## 2020-09-26 DIAGNOSIS — E1159 Type 2 diabetes mellitus with other circulatory complications: Secondary | ICD-10-CM

## 2020-09-26 DIAGNOSIS — I152 Hypertension secondary to endocrine disorders: Secondary | ICD-10-CM

## 2020-09-26 DIAGNOSIS — E1169 Type 2 diabetes mellitus with other specified complication: Secondary | ICD-10-CM | POA: Diagnosis not present

## 2020-09-26 DIAGNOSIS — E1122 Type 2 diabetes mellitus with diabetic chronic kidney disease: Secondary | ICD-10-CM | POA: Diagnosis not present

## 2020-09-26 DIAGNOSIS — E785 Hyperlipidemia, unspecified: Secondary | ICD-10-CM

## 2020-09-26 DIAGNOSIS — N183 Chronic kidney disease, stage 3 unspecified: Secondary | ICD-10-CM

## 2020-09-26 DIAGNOSIS — W57XXXD Bitten or stung by nonvenomous insect and other nonvenomous arthropods, subsequent encounter: Secondary | ICD-10-CM

## 2020-09-26 DIAGNOSIS — S61011S Laceration without foreign body of right thumb without damage to nail, sequela: Secondary | ICD-10-CM | POA: Diagnosis not present

## 2020-09-26 LAB — BAYER DCA HB A1C WAIVED: HB A1C (BAYER DCA - WAIVED): 7.3 % — ABNORMAL HIGH (ref <7.0)

## 2020-09-26 MED ORDER — DOXYCYCLINE HYCLATE 100 MG PO TABS: ORAL_TABLET | ORAL | 0 refills | Status: DC

## 2020-09-26 NOTE — Progress Notes (Signed)
Subjective: CC:DM PCP: Janora Norlander, DO CBS:[facility] Joe [name] is a 80 y.o. male presenting to clinic today for:  1. Type 2 Diabetes with hypertension, hyperlipidemia:  Patient notes that he is doing well with the addition of the [place].  He is injecting 19 units daily.  This is down from 20 units daily.  His blood sugars have run fasting anywhere between 80 and low 110s.  He has had 1 outlier blood sugar at 62.  Typically his evening blood sugars run between 150 and 200s with an occasional blood sugar above 300.  This seems to have occurred after he had eaten desserts.  He is eating regularly scheduled meals now and his weight has come back to his normal range.  Typically weighing between 165 and 167.  Both he and his wife are pleased on how things have gone.  He continues to take Jardiance, metformin, Januvia, Zocor, Benicar.  Last eye exam: Had done on 08/29/2020 Last foot exam: Up-to-date Last A1c:  Lab Results  Component Value Date   HGBA1C 7.3 (H) 09/26/2020   Nephropathy screen indicated?:  On ARB Last flu, zoster and/or pneumovax:  Immunization History  Administered Date(s) Administered  . Fluad Quad(high Dose 65+) 03/16/2019, 04/17/2020  . Influenza, High Dose Seasonal PF 03/28/2016, 03/07/2017, 04/01/2018  . Influenza,inj,Quad PF,6+ Mos 03/23/2013, 03/26/2014, 03/17/2015  . Moderna Sars-Covid-2 Vaccination 06/21/2019, 07/19/2019, 03/28/2020  . Pneumococcal Conjugate-13 06/08/2013  . Pneumococcal Polysaccharide-23 04/03/2008  . Tdap 03/13/2011, 12/26/2018   2.  Tick bites Patient reports that he sustains tick bites frequently.  He has had history of Lyme in the past and wonders how he can manage these tick bites.  He has not had any unusual muscle pains, joint aches, fevers or rashes.  He is very quick to remove the ticks and has not had any engorged ticks as of recently.  His old PCP used to give him a prescription for doxycycline to have on hand to use each time he was  bitten by a tick  ROS: Per HPI  Allergies  Allergen Reactions  . Ace Inhibitors Cough  . Trazamine [Trazodone & Diet Manage Prod] Other (See Comments)    nightmares   Past Medical History:  Diagnosis Date  . Adenomatous colon polyp   . Arthritis   . Cataract   . Chest pain at rest 05/26/2016  . Diabetes mellitus without complication ([place])   . Dyslipidemia   . ESOPHAGITIS, REFLUX 07/09/2004   Qualifier: Diagnosis of  By: Hardin Negus CMA (AAMA), [place]    . Hiatal hernia   . HOH (hard of hearing)    has bilateral Hearing aids  . Hypertension   . Irritable bowel syndrome   . Kidney stone 1970  . Pneumonia   . Tick bites    took 5 rounds of Doxycycline this summer    Current Outpatient Medications:  .  Accu-Chek Softclix Lancets lancets, CHECK BLOOD SUGAR 4 TIMES A DAY OR AS DIRECTED, Disp: , Rfl:  .  aspirin 81 MG tablet, Take 81 mg by mouth daily., Disp: , Rfl:  .  Blood Glucose Monitoring Suppl w/Device KIT, Test BS BID and PRN dx E11.9. Give test strips and lancets to match machine given, Disp: 1 each, Rfl: 1 .  cholecalciferol (VITAMIN D) 1000 UNITS tablet, Take 2,000 Units by mouth daily., Disp: , Rfl:  .  Cinnamon 500 MG TABS, Take 1,000 tablets by mouth 2 (two) times daily., Disp: , Rfl:  .  Continuous Blood Gluc  Receiver (FREESTYLE LIBRE 2 READER) DEVI, Use to test blood sugars up to 6 times daily as directed. DX: E11.9, Disp: 1 each, Rfl: 0 .  Continuous Blood Gluc Sensor (FREESTYLE LIBRE 2 SENSOR) MISC, USE 1 SENSOR TO CHECK BLOOD SUGAR UP TO SIX TIMES DAILY AS DIRECTED, Disp: 2 each, Rfl: 11 .  doxylamine, Sleep, (UNISOM) 25 MG tablet, Take 25 mg by mouth at bedtime as needed for sleep. , Disp: , Rfl:  .  empagliflozin (JARDIANCE) 25 MG TABS tablet, Take 1 tablet (25 mg total) by mouth daily., Disp: 90 tablet, Rfl: 0 .  esomeprazole (NEXIUM) 40 MG capsule, TAKE (1) CAPSULE DAILY, Disp: 30 capsule, Rfl: 0 .  glucose blood (ONETOUCH ULTRA) test strip, Check blood sugar 4  times daily Dx E 11.40, Disp: 200 strip, Rfl: 11 .  insulin degludec (TRESIBA FLEXTOUCH) 100 UNIT/ML FlexTouch Pen, Inject 0.06-0.2 mLs (6-20 Units total) into the skin daily at 10 pm., Disp: 15 mL, Rfl: 3 .  Insulin Pen Needle (PEN NEEDLES) 32G X 5 MM MISC, 1 Device by Does not apply route daily., Disp: 100 each, Rfl: 3 .  JANUVIA 100 MG tablet, TAKE 1 TABLET DAILY, Disp: 90 tablet, Rfl: 0 .  metFORMIN (GLUCOPHAGE-XR) 500 MG 24 hr tablet, TAKE 2 TABLETS 2 TIMES A DAY WITH MEALS, Disp: 360 tablet, Rfl: 0 .  mupirocin ointment (BACTROBAN) 2 %, Apply 1 application topically 2 (two) times daily., Disp: 22 g, Rfl: 1 .  olmesartan (BENICAR) 20 MG tablet, TAKE 1 TABLET ONCE DAILY, Disp: 90 tablet, Rfl: 0 .  Probiotic Product (PROBIOTIC PO), Take 1 tablet by mouth daily., Disp: , Rfl:  .  simvastatin (ZOCOR) 40 MG tablet, TAKE 1 TABLET ONCE DAILY IN THE EVENING, Disp: 90 tablet, Rfl: 0 .  triamcinolone ointment (KENALOG) 0.5 %, APPLY TO AFFECTED AREAS TWICE A DAY, Disp: 30 g, Rfl: 2 Social History   Socioeconomic History  . Marital status: Married    Spouse name: Joe [name]  . Number of children: 1  . Years of education: 73  . Highest education level: Bachelor's degree (e.g., BA, AB, BS)  Occupational History  . Occupation: Retired Pharmacist, hospital  Tobacco Use  . Smoking status: Former Smoker    Packs/day: 2.00    Types: Cigarettes    Start date: 06/03/1954    Quit date: 08/24/1984    Years since quitting: 36.1  . Smokeless tobacco: Never Used  . Tobacco comment: Has not used to tobacco products for the past 20 years  Vaping Use  . Vaping Use: Never used  Substance and Sexual Activity  . Alcohol use: Yes    Alcohol/week: 7.0 standard drinks    Types: 7 Standard drinks or equivalent per week    Comment: Bourbon at night when he sits on the deck  . Drug use: No  . Sexual activity: Yes    Partners: Female  Other Topics Concern  . 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 Stroke Father 74  . Hyperlipidemia Father   . Hypertension Father   . Heart disease Brother   . Cancer Brother        lung   . Hyperlipidemia Brother   . Hypertension Brother   . Stroke Brother   . Diabetes Brother   . Breast cancer Mother   . Colon cancer Neg Hx   . Colon polyps Neg Hx   . Kidney disease Neg Hx   . Esophageal cancer Neg Hx   . Stomach cancer Neg Hx   . Rectal cancer Neg Hx     Objective: Office vital signs reviewed. BP 128/70   Pulse (!) 57   Temp (!) 97.5 F (36.4 C)   Ht 5' 9" (1.753 m)   Wt 164 lb 6.4 oz (74.6 kg)   SpO2 100%   BMI 24.28 kg/m   Physical Examination:  General: Awake, alert, well nourished, No acute distress HEENT: Normal; sclera white.  Moist mucous membranes Cardio: regular rate and rhythm, S1S2 heard, no murmurs appreciated Pulm: clear to auscultation bilaterally, no wheezes, rhonchi or rales; normal work of breathing on room air Extremities: warm, well perfused, No edema, cyanosis or clubbing; +2 pulses bilaterally Skin: Right thumb with healing laceration.  No evidence of secondary bacterial infection but he does have a crack to the skin that is tender.  Lab Results  Component Value Date   HGBA1C 7.3 (H) 09/26/2020    Assessment/ Plan: 80 y.o. male   Diabetes mellitus with stage 3 chronic kidney disease ([place]) - Plan: Bayer DCA Hb A1c Waived, Renal Function Panel, CBC  Hyperlipidemia associated with type 2 diabetes mellitus ([place])  Hypertension associated with  diabetes ([place])  Tick bite, unspecified site, subsequent encounter - Plan: doxycycline (VIBRA-TABS) 100 MG tablet  Laceration of right thumb without foreign body without damage to nail, sequela  His sugar has come down fairly nicely.  A1c was at 7.3 today.  I think given his age that this is in acceptable range.  I hesitate to increase his insulin further given his hypoglycemic episode that has been experienced recently.  He will continue to watch his diet but eat regular scheduled meals.  I would like him to follow-up with [facility] to check on affordability of medication.  Blood pressure is at goal.  He will continue statin.  I have given him a prescription for as needed prophylactic dosing of doxycycline.  We discussed indication to follow-up in office should he develop any symptoms or signs of Lyme disease.  We discussed indications of prophylactic dosing of doxycycline  His thumb showed no evidence of infection.  I gave him Xeroform bandages to promote healing.  He may follow-up with me in 3 months, sooner if needed  No orders of the defined types were placed in this encounter.  No orders of the defined types were placed in this encounter.    Janora Norlander, DO [place] [phone]

## 2020-09-27 LAB — RENAL FUNCTION PANEL
Albumin: 4.5 g/dL (ref 3.7–4.7)
BUN/Creatinine Ratio: 22 (ref 10–24)
BUN: 27 mg/dL (ref 8–27)
CO2: 23 mmol/L (ref 20–29)
Calcium: 9.4 mg/dL (ref 8.6–10.2)
Chloride: 102 mmol/L (ref 96–106)
Creatinine, Ser: 1.22 mg/dL (ref 0.76–1.27)
Glucose: 118 mg/dL — ABNORMAL HIGH (ref 65–99)
Phosphorus: 4.1 mg/dL (ref 2.8–4.1)
Potassium: 4.4 mmol/L (ref 3.5–5.2)
Sodium: 138 mmol/L (ref 134–144)
eGFR: 60 mL/min/{1.73_m2} (ref >59)

## 2020-09-27 LAB — CBC
Hematocrit: 36.2 % — ABNORMAL LOW (ref 37.5–51.0)
Hemoglobin: 12.5 g/dL — ABNORMAL LOW (ref 13.0–17.7)
MCH: 31.6 pg (ref 26.6–33.0)
MCHC: 34.5 g/dL (ref 31.5–35.7)
MCV: 92 fL (ref 79–97)
Platelets: 152 10*3/uL (ref 150–450)
RBC: 3.95 x10E6/uL — ABNORMAL LOW (ref 4.14–5.80)
RDW: 12.5 % (ref 11.6–15.4)
WBC: 5.7 10*3/uL (ref 3.4–10.8)

## 2020-09-29 ENCOUNTER — Other Ambulatory Visit: Payer: Self-pay

## 2020-09-29 ENCOUNTER — Ambulatory Visit: Payer: Medicare PPO | Admitting: Pharmacist

## 2020-09-29 DIAGNOSIS — E119 Type 2 diabetes mellitus without complications: Secondary | ICD-10-CM | POA: Diagnosis not present

## 2020-09-29 NOTE — Progress Notes (Signed)
    09/29/2020 Name: Joe [name] MRN: [id number] DOB: 16-Sep-1940   S:  24 yoM Presents for diabetes evaluation, education, and management Patient was referred and last seen by Primary Care Provider on 09/26/20.   Patient  Uses freestlye libre 2 CGM system.  Of note, he is using topical nasal steroid nasal spray (flonase) to avoid Allergic reaction to CGM adhesive.  Insurance coverage/medication affordability: Veterinary surgeon with medications.  Current diabetes medications include:tresiba, metformin, jardiance, januvia  Current hypertension medications include:olmesartan Goal 130/80  Current hyperlipidemia medications include:simvastatin  Patientdenieshypoglycemic events.  Patient reported dietary habits: Eats52meals/day Patient has made improvements to diet, especially at breakfast.  He is now eating eggs and protein in the mornings to avoid post-breakfast increase in blood sugar Patient and wife keep a dedicated log of blood sugars and eating patterns daily--reviewed, diet has much improved  Discussed meal planning options and Plate method for healthy eating  Avoid sugary drinks and desserts  Incorporate balanced protein, non starchy veggies, 1 serving of carbohydrate with each meal  Increase water intake  Increase physical activity as able  Drinks:increased water intake  Patient-reported exercise habits:n/a due to recent weight loss  O:  Lab Results  Component Value Date   HGBA1C 7.3 (H) 09/26/2020     Lipid Panel     Component Value Date/Time   CHOL 108 06/26/2020 0943   CHOL 105 12/24/2012 0850   TRIG 114 06/26/2020 0943   TRIG 207 (H) 10/29/2016 1100   TRIG 46 12/24/2012 0850   HDL 39 (L) 06/26/2020 0943   HDL 34 (L) 10/29/2016 1100   HDL 44 12/24/2012 0850   CHOLHDL 2.8 06/26/2020 0943   CHOLHDL 2.8 05/26/2016 0349   VLDL 20 05/26/2016 0349   LDLCALC 48 06/26/2020 0943   LDLCALC 59 02/03/2014 0840   LDLCALC  52 12/24/2012 0850    Home fasting blood sugars: 70s-100s  2 hour post-meal/random blood sugars: mostly <180   A/P:  A/P: Diabetes T2DM, A1c decreased to 7.3%.  Expect BGs/A1c to correct to <7% given recent (~57month) changes in diet, etc.  Patient is able to verbalize appropriate hypoglycemia management plan. Patient is adherent with medication.  -Continuedbasal insulin TRESIBA PM Blood Sugar        Insulin dose  80-115                         16 units >115                             units  -Extensively discussed pathophysiology of diabetes, recommended lifestyle interventions, dietary effects on blood sugar control  -Counseled on s/sx of and management of hypoglycemia  -Next A1C anticipated 6 months.    Written patient instructions provided.  Total time in face to face counseling 25 minutes.  Thank you for allowing me to participate in this patient's care.  Regina Eck, PharmD, BCPS Clinical Pharmacist, [place]  II Phone [phone]

## 2020-10-02 DIAGNOSIS — H903 Sensorineural hearing loss, bilateral: Secondary | ICD-10-CM | POA: Diagnosis not present

## 2020-10-03 ENCOUNTER — Other Ambulatory Visit: Payer: Self-pay | Admitting: Family Medicine

## 2020-10-03 DIAGNOSIS — K219 Gastro-esophageal reflux disease without esophagitis: Secondary | ICD-10-CM

## 2020-10-04 DIAGNOSIS — Z23 Encounter for immunization: Secondary | ICD-10-CM | POA: Diagnosis not present

## 2020-10-24 ENCOUNTER — Telehealth: Payer: Self-pay | Admitting: Family Medicine

## 2020-10-24 DIAGNOSIS — E1140 Type 2 diabetes mellitus with diabetic neuropathy, unspecified: Secondary | ICD-10-CM

## 2020-10-24 NOTE — Telephone Encounter (Signed)
Last OV 09/26/20

## 2020-10-24 NOTE — Telephone Encounter (Signed)
  Prescription Request  10/24/2020  What is the name of the medication or equipment? Diabetic Shoes & insoles  Have you contacted your pharmacy to request a refill? (if applicable) no  Which pharmacy would you like this sent to? [facility]   Patient notified that their request is being sent to the clinical staff for review and that they should receive a response within 2 business days.   [facility] pt.  With diagnosis code w/RX  Please call pt.

## 2020-10-24 NOTE — Telephone Encounter (Signed)
Will insurance pay for this.  Has some decreased sensation but do not recall h/o ulcer. No callouses observed on last foot exam.  If exam has changed might need to recheck him in order for proper documentation for shoes.

## 2020-10-25 NOTE — Telephone Encounter (Signed)
I would say recheck before 7/26 appt then, on form I have some options are H/o previous ulcers which you ruled out H/o pre-ulcerative callus peripheral neuropathy w/ evidence of callus formation Poor circulation

## 2020-10-27 NOTE — Telephone Encounter (Signed)
Patient's wife informed that DME order for diabetic shoes and inserts has been completed and faxed to [facility].  Patient is diagnosed with diabetic neuropathy.

## 2020-10-29 DIAGNOSIS — E119 Type 2 diabetes mellitus without complications: Secondary | ICD-10-CM | POA: Diagnosis not present

## 2020-10-29 DIAGNOSIS — H40033 Anatomical narrow angle, bilateral: Secondary | ICD-10-CM | POA: Diagnosis not present

## 2020-11-03 DIAGNOSIS — E1140 Type 2 diabetes mellitus with diabetic neuropathy, unspecified: Secondary | ICD-10-CM | POA: Diagnosis not present

## 2020-11-21 ENCOUNTER — Other Ambulatory Visit: Payer: Self-pay | Admitting: Family Medicine

## 2020-11-21 DIAGNOSIS — E1140 Type 2 diabetes mellitus with diabetic neuropathy, unspecified: Secondary | ICD-10-CM

## 2020-11-29 ENCOUNTER — Telehealth: Payer: Self-pay | Admitting: Family Medicine

## 2020-11-29 NOTE — Telephone Encounter (Signed)
Blood sugar is high...550

## 2020-11-29 NOTE — Telephone Encounter (Signed)
Call re: diabetes: Breakfast: Blue berry waffle with whipped cream Orange juice  Lunch: Pimento cheese sandwich   High on [place], POC is 550  Down to 490, now down to 460 Drinking water Denies N/V/vision changes Patient extremely brittle-->Give Tresiba 3 units now, recheck, continue night time dose Roasted okra, stew beef for dinner  Go to ER if N/V/changes

## 2020-12-19 ENCOUNTER — Other Ambulatory Visit: Payer: Self-pay | Admitting: Family Medicine

## 2020-12-19 DIAGNOSIS — E1169 Type 2 diabetes mellitus with other specified complication: Secondary | ICD-10-CM

## 2020-12-19 DIAGNOSIS — E1140 Type 2 diabetes mellitus with diabetic neuropathy, unspecified: Secondary | ICD-10-CM

## 2020-12-19 DIAGNOSIS — I700 Atherosclerosis of aorta: Secondary | ICD-10-CM

## 2020-12-19 DIAGNOSIS — Z794 Long term (current) use of insulin: Secondary | ICD-10-CM

## 2020-12-26 ENCOUNTER — Ambulatory Visit: Payer: Medicare PPO | Admitting: Family Medicine

## 2021-01-05 ENCOUNTER — Ambulatory Visit: Payer: Medicare PPO | Admitting: Family Medicine

## 2021-01-05 ENCOUNTER — Other Ambulatory Visit: Payer: Self-pay

## 2021-01-05 ENCOUNTER — Encounter: Payer: Self-pay | Admitting: Family Medicine

## 2021-01-05 VITALS — BP 123/65 | HR 63 | Temp 97.9°F | Ht 69.0 in | Wt 165.2 lb

## 2021-01-05 DIAGNOSIS — E1122 Type 2 diabetes mellitus with diabetic chronic kidney disease: Secondary | ICD-10-CM | POA: Diagnosis not present

## 2021-01-05 DIAGNOSIS — E785 Hyperlipidemia, unspecified: Secondary | ICD-10-CM | POA: Diagnosis not present

## 2021-01-05 DIAGNOSIS — Z794 Long term (current) use of insulin: Secondary | ICD-10-CM | POA: Diagnosis not present

## 2021-01-05 DIAGNOSIS — K219 Gastro-esophageal reflux disease without esophagitis: Secondary | ICD-10-CM | POA: Diagnosis not present

## 2021-01-05 DIAGNOSIS — I152 Hypertension secondary to endocrine disorders: Secondary | ICD-10-CM

## 2021-01-05 DIAGNOSIS — L282 Other prurigo: Secondary | ICD-10-CM | POA: Diagnosis not present

## 2021-01-05 DIAGNOSIS — N183 Chronic kidney disease, stage 3 unspecified: Secondary | ICD-10-CM

## 2021-01-05 DIAGNOSIS — E1140 Type 2 diabetes mellitus with diabetic neuropathy, unspecified: Secondary | ICD-10-CM

## 2021-01-05 DIAGNOSIS — E1159 Type 2 diabetes mellitus with other circulatory complications: Secondary | ICD-10-CM

## 2021-01-05 DIAGNOSIS — D696 Thrombocytopenia, unspecified: Secondary | ICD-10-CM | POA: Diagnosis not present

## 2021-01-05 DIAGNOSIS — E1169 Type 2 diabetes mellitus with other specified complication: Secondary | ICD-10-CM

## 2021-01-05 LAB — BAYER DCA HB A1C WAIVED: HB A1C (BAYER DCA - WAIVED): 6.7 % (ref <7.0)

## 2021-01-05 MED ORDER — TRIAMCINOLONE ACETONIDE 0.5 % EX OINT: TOPICAL_OINTMENT | CUTANEOUS | 2 refills | Status: DC

## 2021-01-05 NOTE — Patient Instructions (Signed)

## 2021-01-05 NOTE — Progress Notes (Signed)
Subjective:  [name], male    DOB: 03-12-1941, 80 y.o.   MRN: [id number]  Patient Care Team: Baruch Gouty, FNP as PCP - General (Family Medicine) Irine Seal, MD (Urology) Gatha Mayer, MD (Gastroenterology) Minus Breeding, MD as Consulting Physician (Cardiology) Alanda Slim, Neena Rhymes, MD as Consulting Physician (Ophthalmology) Blanca Friend Royce Macadamia, [facility] (Pharmacist)   Chief Complaint:  Diabetes, Hypertension, Hyperlipidemia, and Medical Management of Chronic Issues   HPI: Joe [name] is a 80 y.o. male presenting on 01/05/2021 for Diabetes, Hypertension, Hyperlipidemia, and Medical Management of Chronic Issues   1. Type 2 diabetes mellitus with diabetic neuropathy, with long-term current use of insulin (HCC) Pt presents for follow up evaluation of Type 2 diabetes mellitus.  Current symptoms include none. Patient denies foot ulcerations, hypoglycemia , increased appetite, nausea, polydipsia, polyuria, visual disturbances, vomiting, and weight loss.  Compliant with meds - Yes  Current monitoring regimen: home blood tests - 2-3 times daily Home blood sugar records: trend: fluctuating a bit but stable Any episodes of hypoglycemia? no  Known diabetic complications: nephropathy, peripheral neuropathy, cardiovascular disease, and hyperlipidemia Cardiovascular risk factors: advanced age (older than 62 for men, 11 for women), diabetes mellitus, dyslipidemia, hypertension, and male gender Eye exam current (within one year): yes Podiatry yearly?  No Weight trend: stable Current diet: in general, a "healthy" diet  , well balanced Current exercise: walking  PNA Vaccine UTD?  Yes Hep B Vaccine?  Yes Tdap Vaccine UTD?  Yes Urine microalbumin UTD? Yes  Is He on ACE inhibitor or angiotensin II receptor blocker?  Yes, olmesartan Is He on statin? Yes simvastatin Is He on ASA 81 mg daily?  Yes   2. Hyperlipidemia associated with type 2 diabetes mellitus  ([place]) Compliant with medications - Yes Current medications - simvastatin Side effects from medications - No Diet - healthy Exercise - walking daily  Lab Results  Component Value Date   CHOL 108 06/26/2020   HDL 39 (L) 06/26/2020   LDLCALC 48 06/26/2020   TRIG 114 06/26/2020   CHOLHDL 2.8 06/26/2020     3. Hypertension associated with diabetes ([place]) Complaint with meds - Yes Current Medications - olmesartan Checking BP at home ranging 120/70 Exercising Regularly - Yes Watching Salt intake - Yes Pertinent ROS:  Headache - No Fatigue - No Visual Disturbances - No Chest pain - No Dyspnea - No Palpitations - No LE edema - No They report good compliance with medications and can restate their regimen by memory. No medication side effects.  BP Readings from Last 3 Encounters:  01/05/21 123/65  09/26/20 128/70  08/15/20 134/69   CMP Latest Ref Rng & Units 09/26/2020 06/26/2020 11/15/2019  Glucose 65 - 99 mg/dL 118(H) 102(H) 92  BUN 8 - 27 mg/dL _0 Creatinine 0.76 - 1.27 mg/dL 1.22 1.28(H) 1.16  Sodium 134 - 144 mmol/L 138 137 136  Potassium 3.5 - 5.2 mmol/L 4.4 3.9 3.9  Chloride 96 - 106 mmol/L 102 101 101  CO2 20 - 29 mmol/L _1 Calcium 8.6 - 10.2 mg/dL 9.4 8.9 9.2  Total Protein 6.0 - 8.5 g/dL - 6.2 -  Total Bilirubin 0.0 - 1.2 mg/dL - 0.6 -  Alkaline Phos 44 - 121 IU/L - 72 -  AST 0 - 40 IU/L - 15 -  ALT 0 - 44 IU/L - 12 -      4. Gastroesophageal reflux disease without esophagitis Compliant with medications -  Yes Current medications - Nexium Adverse side effects - No Cough - No Sore throat - No Voice change - No Hemoptysis - No Dysphagia or dyspepsia - No Water brash - No Red Flags (weight loss, hematochezia, melena, weight loss, early satiety, fevers, odynophagia, or persistent vomiting) - No   5. CKD stage 3 due to type 2 diabetes mellitus (HCC) No changes in urinary output. No swelling,confusion, weakness, or cramping.   6. Thrombocytopenia  (HCC) No abnormal bleeding or bruising.      Relevant past medical, surgical, family, and social history reviewed and updated as indicated.  Allergies and medications reviewed and updated. Date reviewed: Chart in Epic.   Past Medical History:  Diagnosis Date   Adenomatous colon polyp    Arthritis    Cataract    Chest pain at rest 05/26/2016   Diabetes mellitus without complication ([place])    Dyslipidemia    ESOPHAGITIS, REFLUX 07/09/2004   Qualifier: Diagnosis of  By: Hardin Negus CMA Deborra Medina), Stephanie     Hiatal hernia    HOH (hard of hearing)    has bilateral Hearing aids   Hypertension    Irritable bowel syndrome    Kidney stone 1970   Pneumonia    Tick bites    took 5 rounds of Doxycycline this summer    Past Surgical History:  Procedure Laterality Date   COLONOSCOPY     ESOPHAGEAL DILATION  1996   ESOPHAGEAL DILATION  2003   FLEXIBLE SIGMOIDOSCOPY     HEMICOLECTOMY  08/2005   Right   KNEE SURGERY Right    TONSILLECTOMY      Social History   Socioeconomic History   Marital status: Married    Spouse name: [name]   Number of children: 1   Years of education: 16   Highest education level: Bachelor's degree (e.g., BA, AB, BS)  Occupational History   Occupation: Retired Pharmacist, hospital  Tobacco Use   Smoking status: Former    Packs/day: 2.00    Types: Cigarettes    Start date: 06/03/1954    Quit date: 08/24/1984    Years since quitting: 36.3   Smokeless tobacco: Never   Tobacco comments:    Has not used to tobacco products for the past 20 years  Vaping Use   Vaping Use: Never used  Substance and Sexual Activity   Alcohol use: Yes    Alcohol/week: 7.0 standard drinks    Types: 7 Standard drinks or equivalent per week    Comment: [place] at night when he sits on the deck   Drug use: No   Sexual activity: Yes    Partners: Female  Other Topics Concern   Not on file  Social History Narrative   Lives in [place] with wife, both retired teachers   1 son   3  caffeinated beverages daily   1 alcoholic beverage daily   Former smoker no current tobacco no drug use   Social Determinants of Radio broadcast assistant Strain: Not on file  Food Insecurity: Not on file  Transportation Needs: Not on file  Physical Activity: Not on file  Stress: Not on file  Social Connections: Not on file  Intimate Partner Violence: Not on file    Outpatient Encounter Medications as of 01/05/2021  Medication Sig   Accu-Chek Softclix Lancets lancets CHECK BLOOD SUGAR 4 TIMES A DAY OR AS DIRECTED   aspirin 81 MG tablet Take 81 mg by mouth daily.   Blood Glucose Monitoring Suppl w/Device KIT  Test BS BID and PRN dx E11.9. Give test strips and lancets to match machine given   cholecalciferol (VITAMIN D) 1000 UNITS tablet Take 2,000 Units by mouth daily.   Cinnamon 500 MG TABS Take 1,000 tablets by mouth 2 (two) times daily.   Continuous Blood Gluc Receiver (FREESTYLE LIBRE 2 READER) DEVI Use to test blood sugars up to 6 times daily as directed. DX: E11.9   Continuous Blood Gluc Sensor (FREESTYLE LIBRE 2 SENSOR) MISC USE 1 SENSOR TO CHECK BLOOD SUGAR UP TO SIX TIMES DAILY AS DIRECTED   doxycycline (VIBRA-TABS) 100 MG tablet Take 2 tablets as a single dose ONCE per episode IF needed for tick that has been attached for more than 3 days or is engorged.   doxylamine, Sleep, (UNISOM) 25 MG tablet Take 25 mg by mouth at bedtime as needed for sleep.    esomeprazole (NEXIUM) 40 MG capsule TAKE (1) CAPSULE DAILY   glucose blood (ONETOUCH ULTRA) test strip Check blood sugar 4 times daily Dx E 11.40   insulin degludec (TRESIBA FLEXTOUCH) 100 UNIT/ML FlexTouch Pen Inject 0.06-0.2 mLs (6-20 Units total) into the skin daily at 10 pm.   Insulin Pen Needle (PEN NEEDLES) 32G X 5 MM MISC 1 Device by Does not apply route daily.   JANUVIA 100 MG tablet TAKE 1 TABLET DAILY   JARDIANCE 25 MG TABS tablet Take 1 tablet daily.   metFORMIN (GLUCOPHAGE-XR) 500 MG 24 hr tablet TAKE 2 TABLETS 2 TIMES  A DAY WITH MEALS   mupirocin ointment (BACTROBAN) 2 % Apply 1 application topically 2 (two) times daily.   olmesartan (BENICAR) 20 MG tablet TAKE 1 TABLET ONCE DAILY   Probiotic Product (PROBIOTIC PO) Take 1 tablet by mouth daily.   simvastatin (ZOCOR) 40 MG tablet TAKE 1 TABLET ONCE DAILY IN THE EVENING   triamcinolone ointment (KENALOG) 0.5 % APPLY TO AFFECTED AREAS TWICE A DAY   No facility-administered encounter medications on file as of 01/05/2021.    Allergies  Allergen Reactions   Ace Inhibitors Cough   Trazamine [Trazodone & Diet Manage Prod] Other (See Comments)    nightmares    Review of Systems  Constitutional:  Negative for activity change, appetite change, chills, diaphoresis, fatigue, fever and unexpected weight change.  HENT:  Positive for hearing loss.   Eyes: Negative.  Negative for photophobia and visual disturbance.  Respiratory:  Negative for cough, chest tightness and shortness of breath.   Cardiovascular:  Negative for chest pain, palpitations and leg swelling.  Gastrointestinal:  Negative for abdominal pain, blood in stool, constipation, diarrhea, nausea and vomiting.  Endocrine: Negative.  Negative for cold intolerance, heat intolerance, polydipsia, polyphagia and polyuria.  Genitourinary:  Negative for decreased urine volume, difficulty urinating, dysuria, frequency and urgency.  Musculoskeletal:  Negative for arthralgias and myalgias.  Skin: Negative.   Allergic/Immunologic: Negative.   Neurological:  Negative for dizziness, tremors, weakness, numbness and headaches.  Hematological: Negative.   Psychiatric/Behavioral:  Negative for confusion, hallucinations, sleep disturbance and suicidal ideas.   All other systems reviewed and are negative.      Objective:  BP 123/65   Pulse 63   Temp 97.9 F (36.6 C) (Temporal)   Ht _0  (1.753 m)   Wt 165 lb 3.2 oz (74.9 kg)   SpO2 97%   BMI 24.40 kg/m    Wt Readings from Last 3 Encounters:  01/05/21 165 lb  3.2 oz (74.9 kg)  09/26/20 164 lb 6.4 oz (74.6 kg)  08/15/20 166  lb (75.3 kg)    Physical Exam Vitals and nursing note reviewed.  Constitutional:      General: He is not in acute distress.    Appearance: Normal appearance. He is well-developed and well-groomed. He is not ill-appearing, toxic-appearing or diaphoretic.  HENT:     Head: Normocephalic and atraumatic.     Jaw: There is normal jaw occlusion.     Right Ear: Hearing normal.     Left Ear: Hearing normal.     Ears:     Comments: Bilateral hearing aids    Nose: Nose normal.     Mouth/Throat:     Lips: Pink.     Mouth: Mucous membranes are moist.     Pharynx: Oropharynx is clear. Uvula midline.  Eyes:     General: Lids are normal.     Extraocular Movements: Extraocular movements intact.     Conjunctiva/sclera: Conjunctivae normal.     Pupils: Pupils are equal, round, and reactive to light.  Neck:     Thyroid: No thyroid mass, thyromegaly or thyroid tenderness.     Vascular: No carotid bruit or JVD.     Trachea: Trachea and phonation normal.  Cardiovascular:     Rate and Rhythm: Normal rate and regular rhythm.     Chest Wall: PMI is not displaced.     Pulses: Normal pulses.     Heart sounds: Normal heart sounds. No murmur heard.   No friction rub. No gallop.  Pulmonary:     Effort: Pulmonary effort is normal. No respiratory distress.     Breath sounds: Normal breath sounds. No wheezing.  Abdominal:     General: Bowel sounds are normal. There is no distension or abdominal bruit.     Palpations: Abdomen is soft. There is no hepatomegaly or splenomegaly.     Tenderness: There is no abdominal tenderness. There is no right CVA tenderness or left CVA tenderness.     Hernia: No hernia is present.  Musculoskeletal:        General: Normal range of motion.     Cervical back: Normal range of motion and neck supple.     Right lower leg: No edema.     Left lower leg: No edema.  Lymphadenopathy:     Cervical: No cervical  adenopathy.  Skin:    General: Skin is warm and dry.     Capillary Refill: Capillary refill takes less than 2 seconds.     Coloration: Skin is not cyanotic, jaundiced or pale.     Findings: Lesion (small blister between right great toe and second toe, intact, no erythema.) present. No rash.  Neurological:     General: No focal deficit present.     Mental Status: He is alert and oriented to person, place, and time.     Cranial Nerves: Cranial nerves are intact.     Sensory: Sensation is intact.     Motor: Motor function is intact.     Coordination: Coordination is intact.     Gait: Gait is intact.     Deep Tendon Reflexes: Reflexes are normal and symmetric.  Psychiatric:        Attention and Perception: Attention and perception normal.        Mood and Affect: Mood and affect normal.        Speech: Speech normal.        Behavior: Behavior normal. Behavior is cooperative.        Thought Content: Thought content normal.  Cognition and Memory: Cognition and memory normal.        Judgment: Judgment normal.    Results for orders placed or performed in visit on 01/05/21  Bayer DCA Hb A1c Waived  Result Value Ref Range   HB A1C (BAYER DCA - WAIVED) 6.7 <7.0 %       Pertinent labs & imaging results that were available during my care of the patient were reviewed by me and considered in my medical decision making.  Assessment & Plan:  Joe [name] was seen today for diabetes, hypertension, hyperlipidemia and medical management of chronic issues.  Diagnoses and all orders for this visit:  Type 2 diabetes mellitus with diabetic neuropathy, with long-term current use of insulin (HCC) A1C 6.7 today, very well controlled. Labs pending. Continue current regimen. If A1C remains well controlled, consider titrating off of insulin.  -     CMP14+EGFR -     Bayer DCA Hb A1c Waived -     Microalbumin / creatinine urine ratio  Hyperlipidemia associated with type 2 diabetes mellitus ([place]) On  statin, labs pending. Will adjust regimen is warranted. Diet and exercise encouraged.  -     CMP14+EGFR -     Lipid panel  Hypertension associated with diabetes ([place]) Well controlled, continue current regimen. DASH diet and exercise encouraged. Labs pending.  -     CBC with Differential/Platelet -     CMP14+EGFR -     Lipid panel  Gastroesophageal reflux disease without esophagitis Well controlled. No red flags present. Check CBC today.  -     CBC with Differential/Platelet  CKD stage 3 due to type 2 diabetes mellitus ([place]) Labs pending. On ARB.  -     CBC with Differential/Platelet -     CMP14+EGFR -     Microalbumin / creatinine urine ratio  Thrombocytopenia (HCC) No abnormal bleeding or bruising, will check CBC today.  -     CBC with Differential/Platelet    Continue all other maintenance medications.  Follow up plan: Return in about 3 months (around 04/07/2021), or if symptoms worsen or fail to improve.   Continue healthy lifestyle choices, including diet (rich in fruits, vegetables, and lean proteins, and low in salt and simple carbohydrates) and exercise (at least 30 minutes of moderate physical activity daily).  Educational handout given for DM  The above assessment and management plan was discussed with the patient. The patient verbalized understanding of and has agreed to the management plan. Patient is aware to call the clinic if they develop any new symptoms or if symptoms persist or worsen. Patient is aware when to return to the clinic for a follow-up visit. Patient educated on when it is appropriate to go to the emergency department.   Monia Pouch, FNP-C [facility] [phone]

## 2021-01-06 LAB — LIPID PANEL
Chol/HDL Ratio: 2.7 ratio (ref 0.0–5.0)
Cholesterol, Total: 112 mg/dL (ref 100–199)
HDL: 41 mg/dL (ref >39)
LDL Chol Calc (NIH): 36 mg/dL (ref 0–99)
Triglycerides: 227 mg/dL — ABNORMAL HIGH (ref 0–149)
VLDL Cholesterol Cal: 35 mg/dL (ref 5–40)

## 2021-01-06 LAB — CMP14+EGFR
ALT: 12 IU/L (ref 0–44)
AST: 15 IU/L (ref 0–40)
Albumin/Globulin Ratio: 1.9 (ref 1.2–2.2)
Albumin: 4 g/dL (ref 3.7–4.7)
Alkaline Phosphatase: 56 IU/L (ref 44–121)
BUN/Creatinine Ratio: 18 (ref 10–24)
BUN: 23 mg/dL (ref 8–27)
Bilirubin Total: 0.3 mg/dL (ref 0.0–1.2)
CO2: 21 mmol/L (ref 20–29)
Calcium: 8.8 mg/dL (ref 8.6–10.2)
Chloride: 105 mmol/L (ref 96–106)
Creatinine, Ser: 1.31 mg/dL — ABNORMAL HIGH (ref 0.76–1.27)
Globulin, Total: 2.1 g/dL (ref 1.5–4.5)
Glucose: 218 mg/dL — ABNORMAL HIGH (ref 65–99)
Potassium: 4.8 mmol/L (ref 3.5–5.2)
Sodium: 139 mmol/L (ref 134–144)
Total Protein: 6.1 g/dL (ref 6.0–8.5)
eGFR: 55 mL/min/{1.73_m2} — ABNORMAL LOW (ref >59)

## 2021-01-06 LAB — CBC WITH DIFFERENTIAL/PLATELET
Basophils Absolute: 0.1 10*3/uL (ref 0.0–0.2)
Basos: 2 %
EOS (ABSOLUTE): 0.4 10*3/uL (ref 0.0–0.4)
Eos: 8 %
Hematocrit: 34.3 % — ABNORMAL LOW (ref 37.5–51.0)
Hemoglobin: 11.7 g/dL — ABNORMAL LOW (ref 13.0–17.7)
Immature Grans (Abs): 0 10*3/uL (ref 0.0–0.1)
Immature Granulocytes: 0 %
Lymphocytes Absolute: 0.9 10*3/uL (ref 0.7–3.1)
Lymphs: 18 %
MCH: 31.8 pg (ref 26.6–33.0)
MCHC: 34.1 g/dL (ref 31.5–35.7)
MCV: 93 fL (ref 79–97)
Monocytes Absolute: 0.4 10*3/uL (ref 0.1–0.9)
Monocytes: 8 %
Neutrophils Absolute: 3.3 10*3/uL (ref 1.4–7.0)
Neutrophils: 64 %
Platelets: 122 10*3/uL — ABNORMAL LOW (ref 150–450)
RBC: 3.68 x10E6/uL — ABNORMAL LOW (ref 4.14–5.80)
RDW: 12.4 % (ref 11.6–15.4)
WBC: 5.1 10*3/uL (ref 3.4–10.8)

## 2021-01-06 LAB — MICROALBUMIN / CREATININE URINE RATIO
Creatinine, Urine: 37.6 mg/dL
Microalb/Creat Ratio: <8 mg/g creat (ref 0–29)
Microalbumin, Urine: <3 ug/mL

## 2021-01-09 NOTE — Progress Notes (Signed)
Lm 8/9-jhb

## 2021-01-09 NOTE — Progress Notes (Signed)
Pt aware.

## 2021-01-09 NOTE — Progress Notes (Signed)
Pt r/c about labs - please call back

## 2021-03-07 ENCOUNTER — Other Ambulatory Visit: Payer: Self-pay | Admitting: Family Medicine

## 2021-03-07 DIAGNOSIS — E1140 Type 2 diabetes mellitus with diabetic neuropathy, unspecified: Secondary | ICD-10-CM

## 2021-03-07 DIAGNOSIS — Z794 Long term (current) use of insulin: Secondary | ICD-10-CM

## 2021-03-08 ENCOUNTER — Other Ambulatory Visit: Payer: Self-pay | Admitting: Family Medicine

## 2021-03-08 DIAGNOSIS — E785 Hyperlipidemia, unspecified: Secondary | ICD-10-CM

## 2021-03-08 DIAGNOSIS — E1140 Type 2 diabetes mellitus with diabetic neuropathy, unspecified: Secondary | ICD-10-CM

## 2021-03-08 DIAGNOSIS — I700 Atherosclerosis of aorta: Secondary | ICD-10-CM

## 2021-03-08 DIAGNOSIS — E1169 Type 2 diabetes mellitus with other specified complication: Secondary | ICD-10-CM

## 2021-03-14 DIAGNOSIS — Z23 Encounter for immunization: Secondary | ICD-10-CM | POA: Diagnosis not present

## 2021-03-23 ENCOUNTER — Ambulatory Visit (INDEPENDENT_AMBULATORY_CARE_PROVIDER_SITE_OTHER): Payer: Medicare PPO

## 2021-03-23 DIAGNOSIS — Z23 Encounter for immunization: Secondary | ICD-10-CM | POA: Diagnosis not present

## 2021-04-04 ENCOUNTER — Ambulatory Visit (INDEPENDENT_AMBULATORY_CARE_PROVIDER_SITE_OTHER): Payer: Medicare PPO

## 2021-04-04 VITALS — Ht 69.0 in | Wt 168.0 lb

## 2021-04-04 DIAGNOSIS — Z0000 Encounter for general adult medical examination without abnormal findings: Secondary | ICD-10-CM | POA: Diagnosis not present

## 2021-04-04 DIAGNOSIS — Z77098 Contact with and (suspected) exposure to other hazardous, chiefly nonmedicinal, chemicals: Secondary | ICD-10-CM | POA: Insufficient documentation

## 2021-04-04 DIAGNOSIS — Z7739 Contact with and (suspected) exposure to other war theater: Secondary | ICD-10-CM | POA: Insufficient documentation

## 2021-04-04 DIAGNOSIS — L219 Seborrheic dermatitis, unspecified: Secondary | ICD-10-CM | POA: Insufficient documentation

## 2021-04-04 NOTE — Progress Notes (Signed)
Subjective:   Joe [name] is a 80 y.o. male who presents for Medicare Annual/Subsequent preventive examination.  Review of Systems     Cardiac Risk Factors include: advanced age (>47mn, >>56women);diabetes mellitus;dyslipidemia;male gender;hypertension;sedentary lifestyle     Objective:    Today's Vitals   04/04/21 1046  Weight: 168 lb (76.2 kg)  Height: 5' 9"  (1.753 m)   Body mass index is 24.81 kg/m.  Advanced Directives 04/04/2021 03/14/2020 02/23/2019 02/17/2018 11/07/2016 05/26/2016 11/01/2015  Does Patient Have a Medical Advance Directive? No No No No No No Yes  Type of Advance Directive - - - - - - HPress photographerLiving will  Copy of HBenain Chart? - - - - - - No - copy requested  Would patient like information on creating a medical advance directive? No - Patient declined No - Patient declined No - Patient declined No - Patient declined - - -    Current Medications (verified) Outpatient Encounter Medications as of 04/04/2021  Medication Sig   Accu-Chek Softclix Lancets lancets CHECK BLOOD SUGAR 4 TIMES A DAY OR AS DIRECTED   aspirin 81 MG tablet Take 81 mg by mouth daily.   Blood Glucose Monitoring Suppl w/Device KIT Test BS BID and PRN dx E11.9. Give test strips and lancets to match machine given   cholecalciferol (VITAMIN D) 1000 UNITS tablet Take 2,000 Units by mouth daily.   Cinnamon 500 MG TABS Take 1,000 tablets by mouth 2 (two) times daily.   Continuous Blood Gluc Receiver (FREESTYLE LIBRE 2 READER) DEVI Use to test blood sugars up to 6 times daily as directed. DX: E11.9   Continuous Blood Gluc Sensor (FREESTYLE LIBRE 2 SENSOR) MISC USE 1 SENSOR TO CHECK BLOOD SUGAR UP TO SIX TIMES DAILY AS DIRECTED   doxycycline (VIBRA-TABS) 100 MG tablet Take 2 tablets as a single dose ONCE per episode IF needed for tick that has been attached for more than 3 days or is engorged.   doxylamine, Sleep, (UNISOM) 25 MG tablet Take 25 mg by mouth  at bedtime as needed for sleep.    esomeprazole (NEXIUM) 40 MG capsule TAKE (1) CAPSULE DAILY   glucose blood (ONETOUCH ULTRA) test strip Check blood sugar 4 times daily Dx E 11.40   Insulin Pen Needle (PEN NEEDLES) 32G X 5 MM MISC 1 Device by Does not apply route daily.   JANUVIA 100 MG tablet TAKE 1 TABLET DAILY   JARDIANCE 25 MG TABS tablet Take 1 tablet daily.   metFORMIN (GLUCOPHAGE-XR) 500 MG 24 hr tablet TAKE 2 TABLETS 2 TIMES A DAY WITH MEALS   mupirocin ointment (BACTROBAN) 2 % Apply 1 application topically 2 (two) times daily.   olmesartan (BENICAR) 20 MG tablet TAKE 1 TABLET ONCE DAILY   Probiotic Product (PROBIOTIC PO) Take 1 tablet by mouth daily.   simvastatin (ZOCOR) 40 MG tablet TAKE 1 TABLET ONCE DAILY IN THE EVENING   TRESIBA FLEXTOUCH 100 UNIT/ML FlexTouch Pen Inject 0.06-0.2 mLs (6-20 Units total) into the skin daily at 10 pm.   triamcinolone ointment (KENALOG) 0.5 % APPLY TO AFFECTED AREAS TWICE A DAY   No facility-administered encounter medications on file as of 04/04/2021.    Allergies (verified) Ace inhibitors and Trazamine [trazodone & diet manage prod]   History: Past Medical History:  Diagnosis Date   Adenomatous colon polyp    Arthritis    Cataract    Chest pain at rest 05/26/2016   Diabetes mellitus without complication (  [place])    Dyslipidemia    ESOPHAGITIS, REFLUX 07/09/2004   Qualifier: Diagnosis of  By: Hardin Negus CMA Deborra Medina), Stephanie     Hiatal hernia    HOH (hard of hearing)    has bilateral Hearing aids   Hypertension    Irritable bowel syndrome    Kidney stone 1970   Pneumonia    Tick bites    took 5 rounds of Doxycycline this summer   Past Surgical History:  Procedure Laterality Date   COLONOSCOPY     ESOPHAGEAL DILATION  1996   ESOPHAGEAL DILATION  2003   FLEXIBLE SIGMOIDOSCOPY     HEMICOLECTOMY  08/2005   Right   KNEE SURGERY Right    TONSILLECTOMY     Family History  Problem Relation Age of Onset   Stroke Father 52    Hyperlipidemia Father    Hypertension Father    Heart disease Brother    Cancer Brother        lung    Hyperlipidemia Brother    Hypertension Brother    Stroke Brother    Diabetes Brother    Breast cancer Mother    Colon cancer Neg Hx    Colon polyps Neg Hx    Kidney disease Neg Hx    Esophageal cancer Neg Hx    Stomach cancer Neg Hx    Rectal cancer Neg Hx    Social History   Socioeconomic History   Marital status: Married    Spouse name: [name]   Number of children: 1   Years of education: 16   Highest education level: Bachelor's degree (e.g., BA, AB, BS)  Occupational History   Occupation: Retired Pharmacist, hospital  Tobacco Use   Smoking status: Former    Packs/day: 2.00    Types: Cigarettes    Start date: 06/03/1954    Quit date: 08/24/1984    Years since quitting: 36.6   Smokeless tobacco: Never   Tobacco comments:    Has not used to tobacco products for the past 20 years  Vaping Use   Vaping Use: Never used  Substance and Sexual Activity   Alcohol use: Yes    Alcohol/week: 7.0 standard drinks    Types: 7 Standard drinks or equivalent per week    Comment: [place] at night when he sits on the deck   Drug use: No   Sexual activity: Yes    Partners: Female  Other Topics Concern   Not on file  Social History Narrative   Lives in [place] with wife, both retired teachers   1 son   3 caffeinated beverages daily   1 alcoholic beverage daily   Former smoker no current tobacco no drug use   Social Determinants of Radio broadcast assistant Strain: Low Risk    Difficulty of Paying Living Expenses: Not hard at all  Food Insecurity: No Food Insecurity   Worried About Charity fundraiser in the Last Year: Never true   Arboriculturist in the Last Year: Never true  Transportation Needs: No Transportation Needs   Lack of Transportation (Medical): No   Lack of Transportation (Non-Medical): No  Physical Activity: Insufficiently Active   Days of Exercise per Week: 3 days    Minutes of Exercise per Session: 20 min  Stress: No Stress Concern Present   Feeling of Stress : Not at all  Social Connections: Moderately Isolated   Frequency of Communication with Friends and Family: More than three times a week  Frequency of Social Gatherings with Friends and Family: Once a week   Attends Religious Services: Never   Marine scientist or Organizations: No   Attends Music therapist: Never   Marital Status: Married    Tobacco Counseling Counseling given: Not Answered Tobacco comments: Has not used to tobacco products for the past 20 years   Clinical Intake:  Pre-visit preparation completed: Yes  Pain : No/denies pain     BMI - recorded: 24.81 Nutritional Status: BMI of 19-24  Normal Nutritional Risks: None Diabetes: Yes CBG done?: No Did pt. bring in CBG monitor from home?: No  How often do you need to have someone help you when you read instructions, pamphlets, or other written materials from your doctor or pharmacy?: 1 - Never  Diabetic? Yes Nutrition Risk Assessment:  Has the patient had any N/V/D within the last 2 months?  No  Does the patient have any non-healing wounds?  No  Has the patient had any unintentional weight loss or weight gain?  No   Diabetes:  Is the patient diabetic?  Yes  If diabetic, was a CBG obtained today?  No  Did the patient bring in their glucometer from home?  No  How often do you monitor your CBG's? At least BID - has [employer].   Financial Strains and Diabetes Management:  Are you having any financial strains with the device, your supplies or your medication? No .  Does the patient want to be seen by Chronic Care Management for management of their diabetes?  No  Would the patient like to be referred to a Nutritionist or for Diabetic Management?  No   Diabetic Exams:  Diabetic Eye Exam: Completed 08/29/2020.  Diabetic Foot Exam: Completed 01/05/2021. Pt has been advised about the importance  in completing this exam. Pt is scheduled for diabetic foot exam on next year.    Interpreter Needed?: No  Information entered by :: Magdelyn Roebuck, LPN   Activities of Daily Living In your present state of health, do you have any difficulty performing the following activities: 04/04/2021  Hearing? Y  Comment wears hearing aids  Vision? N  Difficulty concentrating or making decisions? N  Walking or climbing stairs? N  Dressing or bathing? N  Doing errands, shopping? N  Preparing Food and eating ? N  Using the Toilet? N  In the past six months, have you accidently leaked urine? N  Do you have problems with loss of bowel control? N  Managing your Medications? N  Managing your Finances? N  Housekeeping or managing your Housekeeping? N  Some recent data might be hidden    Patient Care Team: Rakes, Connye Burkitt, FNP as PCP - General (Family Medicine) Irine Seal, MD (Urology) Gatha Mayer, MD (Gastroenterology) Minus Breeding, MD as Consulting Physician (Cardiology) Lavera Guise, [facility] (Pharmacist) Harlen Labs, MD as Referring Physician (Optometry)  Indicate any recent Medical Services you may have received from other than [facility] providers in the past year (date may be approximate).     Assessment:   This is a routine wellness examination for [place].  Hearing/Vision screen Hearing Screening - Comments:: Wears hearing aids - from [facility] Vision Screening - Comments:: Wears rx glasses - up to date with annual eye exams with [facility] in [place]  Dietary issues and exercise activities discussed: Current Exercise Habits: Home exercise routine, Type of exercise: Other - see comments (elliptical), Time (Minutes): 20, Frequency (Times/Week): 3, Weekly  Exercise (Minutes/Week): 60, Intensity: Mild, Exercise limited by: None identified   Goals Addressed             This Visit's Progress    DIET - INCREASE WATER INTAKE   Not on track    Exercise 150 min/wk Moderate  Activity   Not on track      Depression Screen [facility] 2/9 Scores 04/04/2021 01/05/2021 09/26/2020 08/15/2020 07/25/2020 06/26/2020 02/25/2020  PHQ - 2 Score 0 0 0 0 0 0 0  PHQ- 9 Score - - 0 0 0 0 0    Fall Risk Fall Risk  04/04/2021 01/05/2021 09/26/2020 08/15/2020 08/15/2020  Falls in the past year? 0 0 0 0 0  Number falls in past yr: 0 - - - -  Injury with Fall? 0 - - - -  Risk for fall due to : No Fall Risks - - - -  Follow up Falls prevention discussed - - - -  Comment - - - - -    FALL RISK PREVENTION PERTAINING TO THE HOME:  Any stairs in or around the home? Yes  If so, are there any without handrails? No  Home free of loose throw rugs in walkways, pet beds, electrical cords, etc? Yes  Adequate lighting in your home to reduce risk of falls? Yes   ASSISTIVE DEVICES UTILIZED TO PREVENT FALLS:  Life alert? No  Use of a cane, walker or w/c? No  Grab bars in the bathroom? No  Shower chair or bench in shower? Yes  Elevated toilet seat or a handicapped toilet? Yes   TIMED UP AND GO:  Was the test performed? No . Telephonic visit  Cognitive Function: Normal cognitive status assessed by direct observation by this Nurse Health Advisor. No abnormalities found.    MMSE - Mini Mental State Exam 02/17/2018 02/17/2018 11/07/2016 11/01/2015  Orientation to time 5 5 5 5   Orientation to Place 5 5 5 5   Registration 3 3 3 3   Attention/ Calculation 5 5 5 5   Recall 3 3 3 2   Language- name 2 objects 2 2 2 2   Language- repeat 1 1 1 1   Language- follow 3 step command 3 - 3 3  Language- read & follow direction 1 - 1 1  Write a sentence 1 - 1 1  Copy design 1 - 1 1  Total score 30 - 30 29     6CIT Screen 03/14/2020 02/23/2019  What Year? 0 points 0 points  What month? 0 points 0 points  What time? 0 points 0 points  Count back from 20 0 points 0 points  Months in reverse 0 points 0 points  Repeat phrase 0 points 0 points  Total Score 0 0    Immunizations Immunization History  Administered  Date(s) Administered   Fluad Quad(high Dose 65+) 03/16/2019, 04/17/2020, 03/23/2021   Influenza, High Dose Seasonal PF 03/28/2016, 03/07/2017, 04/01/2018   Influenza,inj,Quad PF,6+ Mos 03/23/2013, 03/26/2014, 03/17/2015   Influenza-Unspecified 03/28/2016, 03/07/2017, 04/01/2018   Moderna Covid-19 Vaccine Bivalent Booster 61yr & up 03/14/2021   Moderna Sars-Covid-2 Vaccination 06/21/2019, 07/19/2019, 03/28/2020, 10/04/2020   Pneumococcal Conjugate-13 06/08/2013   Pneumococcal Polysaccharide-23 04/03/2008   Tdap 03/13/2011, 12/26/2018    TDAP status: Up to date  Flu Vaccine status: Up to date  Pneumococcal vaccine status: Up to date  Covid-19 vaccine status: Completed vaccines  Qualifies for Shingles Vaccine? Yes   Zostavax completed No   Shingrix Completed?: No.    Education has been provided regarding the  importance of this vaccine. Patient has been advised to call insurance company to determine out of pocket expense if they have not yet received this vaccine. Advised may also receive vaccine at local pharmacy or Health Dept. Verbalized acceptance and understanding.  Screening Tests Health Maintenance  Topic Date Due   Zoster Vaccines- Shingrix (1 of 2) Never done   HEMOGLOBIN A1C  07/08/2021   OPHTHALMOLOGY EXAM  08/29/2021   FOOT EXAM  01/05/2022   TETANUS/TDAP  12/25/2028   Pneumonia Vaccine 11+ Years old  Completed   INFLUENZA VACCINE  Completed   COVID-19 Vaccine  Completed   Hepatitis C Screening  Completed   HPV VACCINES  Aged Out    Health Maintenance  Health Maintenance Due  Topic Date Due   Zoster Vaccines- Shingrix (1 of 2) Never done    Colorectal cancer screening: No longer required.   Lung Cancer Screening: (Low Dose CT Chest recommended if Age 60-80 years, 30 pack-year currently smoking OR have quit w/in 15years.) does not qualify.   Additional Screening:  Hepatitis C Screening: does not qualify; Completed 10/29/2016  Vision Screening: Recommended  annual ophthalmology exams for early detection of glaucoma and other disorders of the eye. Is the patient up to date with their annual eye exam?  Yes  Who is the provider or what is the name of the office in which the patient attends annual eye exams? [place] If pt is not established with a provider, would they like to be referred to a provider to establish care? No .   Dental Screening: Recommended annual dental exams for proper oral hygiene  Community Resource Referral / Chronic Care Management: CRR required this visit?  No   CCM required this visit?  No      Plan:     I have personally reviewed and noted the following in the patient's chart:   Medical and social history Use of alcohol, tobacco or illicit drugs  Current medications and supplements including opioid prescriptions. Patient is not currently taking opioid prescriptions. Functional ability and status Nutritional status Physical activity Advanced directives List of other physicians Hospitalizations, surgeries, and ER visits in previous 12 months Vitals Screenings to include cognitive, depression, and falls Referrals and appointments  In addition, I have reviewed and discussed with patient certain preventive protocols, quality metrics, and best practice recommendations. A written personalized care plan for preventive services as well as general preventive health recommendations were provided to patient.     Sandrea Hammond, LPN   [id number]   Nurse Notes: None

## 2021-04-04 NOTE — Patient Instructions (Signed)
Joe [name] , Thank you for taking time to come for your Medicare Wellness Visit. I appreciate your ongoing commitment to your health goals. Please review the following plan we discussed and let me know if I can assist you in the future.   Screening recommendations/referrals: Colonoscopy: Done 11/08/2019 - no repeat Recommended yearly ophthalmology/optometry visit for glaucoma screening and checkup Recommended yearly dental visit for hygiene and checkup  Vaccinations: Influenza vaccine: Done 03/23/2021 - Repeat annually  Pneumococcal vaccine: Done 06/08/2013 & 04/03/2008 Tdap vaccine: Done 12/26/2018 - Repeat in 10 years  Shingles vaccine: Due   Covid-19: Done 06/21/2019, 07/19/2019, 03/28/2020, 10/04/2020 & 03/14/2021  Advanced directives: Advance directive discussed with you today. Even though you declined this today, please call our office should you change your mind, and we can give you the proper paperwork for you to fill out.   Conditions/risks identified: Aim for 30 minutes of exercise or brisk walking each day, drink 6-8 glasses of water and eat lots of fruits and vegetables.   Next appointment: Follow up in one year for your annual wellness visit.   Preventive Care 80 Years and Older, Male  Preventive care refers to lifestyle choices and visits with your health care provider that can promote health and wellness. What does preventive care include? A yearly physical exam. This is also called an annual well check. Dental exams once or twice a year. Routine eye exams. Ask your health care provider how often you should have your eyes checked. Personal lifestyle choices, including: Daily care of your teeth and gums. Regular physical activity. Eating a healthy diet. Avoiding tobacco and drug use. Limiting alcohol use. Practicing safe sex. Taking low doses of aspirin every day. Taking vitamin and mineral supplements as recommended by your health care provider. What happens during an annual  well check? The services and screenings done by your health care provider during your annual well check will depend on your age, overall health, lifestyle risk factors, and family history of disease. Counseling  Your health care provider may ask you questions about your: Alcohol use. Tobacco use. Drug use. Emotional well-being. Home and relationship well-being. Sexual activity. Eating habits. History of falls. Memory and ability to understand (cognition). Work and work Statistician. Screening  You may have the following tests or measurements: Height, weight, and BMI. Blood pressure. Lipid and cholesterol levels. These may be checked every 5 years, or more frequently if you are over 85 years old. Skin check. Lung cancer screening. You may have this screening every year starting at age 68 if you have a 30-pack-year history of smoking and currently smoke or have quit within the past 15 years. Fecal occult blood test (FOBT) of the stool. You may have this test every year starting at age 40. Flexible sigmoidoscopy or colonoscopy. You may have a sigmoidoscopy every 5 years or a colonoscopy every 10 years starting at age 47. Prostate cancer screening. Recommendations will vary depending on your family history and other risks. Hepatitis C blood test. Hepatitis B blood test. Sexually transmitted disease (STD) testing. Diabetes screening. This is done by checking your blood sugar (glucose) after you have not eaten for a while (fasting). You may have this done every 1-3 years. Abdominal aortic aneurysm (AAA) screening. You may need this if you are a current or former smoker. Osteoporosis. You may be screened starting at age 35 if you are at high risk. Talk with your health care provider about your test results, treatment options, and if necessary, the need  for more tests. Vaccines  Your health care provider may recommend certain vaccines, such as: Influenza vaccine. This is recommended every  year. Tetanus, diphtheria, and acellular pertussis (Tdap, Td) vaccine. You may need a Td booster every 10 years. Zoster vaccine. You may need this after age 79. Pneumococcal 13-valent conjugate (PCV13) vaccine. One dose is recommended after age 81. Pneumococcal polysaccharide (PPSV23) vaccine. One dose is recommended after age 70. Talk to your health care provider about which screenings and vaccines you need and how often you need them. This information is not intended to replace advice given to you by your health care provider. Make sure you discuss any questions you have with your health care provider. Document Released: 06/16/2015 Document Revised: 02/07/2016 Document Reviewed: 03/21/2015 Elsevier Interactive Patient Education  2017 [place] Prevention in the Home Falls can cause injuries. They can happen to people of all ages. There are many things you can do to make your home safe and to help prevent falls. What can I do on the outside of my home? Regularly fix the edges of walkways and driveways and fix any cracks. Remove anything that might make you trip as you walk through a door, such as a raised step or threshold. Trim any bushes or trees on the path to your home. Use bright outdoor lighting. Clear any walking paths of anything that might make someone trip, such as rocks or tools. Regularly check to see if handrails are loose or broken. Make sure that both sides of any steps have handrails. Any raised decks and porches should have guardrails on the edges. Have any leaves, snow, or ice cleared regularly. Use sand or salt on walking paths during winter. Clean up any spills in your garage right away. This includes oil or grease spills. What can I do in the bathroom? Use night lights. Install grab bars by the toilet and in the tub and shower. Do not use towel bars as grab bars. Use non-skid mats or decals in the tub or shower. If you need to sit down in the shower, use a  plastic, non-slip stool. Keep the floor dry. Clean up any water that spills on the floor as soon as it happens. Remove soap buildup in the tub or shower regularly. Attach bath mats securely with double-sided non-slip rug tape. Do not have throw rugs and other things on the floor that can make you trip. What can I do in the bedroom? Use night lights. Make sure that you have a light by your bed that is easy to reach. Do not use any sheets or blankets that are too big for your bed. They should not hang down onto the floor. Have a firm chair that has side arms. You can use this for support while you get dressed. Do not have throw rugs and other things on the floor that can make you trip. What can I do in the kitchen? Clean up any spills right away. Avoid walking on wet floors. Keep items that you use a lot in easy-to-reach places. If you need to reach something above you, use a strong step stool that has a grab bar. Keep electrical cords out of the way. Do not use floor polish or wax that makes floors slippery. If you must use wax, use non-skid floor wax. Do not have throw rugs and other things on the floor that can make you trip. What can I do with my stairs? Do not leave any items on the stairs.  Make sure that there are handrails on both sides of the stairs and use them. Fix handrails that are broken or loose. Make sure that handrails are as long as the stairways. Check any carpeting to make sure that it is firmly attached to the stairs. Fix any carpet that is loose or worn. Avoid having throw rugs at the top or bottom of the stairs. If you do have throw rugs, attach them to the floor with carpet tape. Make sure that you have a light switch at the top of the stairs and the bottom of the stairs. If you do not have them, ask someone to add them for you. What else can I do to help prevent falls? Wear shoes that: Do not have high heels. Have rubber bottoms. Are comfortable and fit you  well. Are closed at the toe. Do not wear sandals. If you use a stepladder: Make sure that it is fully opened. Do not climb a closed stepladder. Make sure that both sides of the stepladder are locked into place. Ask someone to hold it for you, if possible. Clearly mark and make sure that you can see: Any grab bars or handrails. First and last steps. Where the edge of each step is. Use tools that help you move around (mobility aids) if they are needed. These include: Canes. Walkers. Scooters. Crutches. Turn on the lights when you go into a dark area. Replace any light bulbs as soon as they burn out. Set up your furniture so you have a clear path. Avoid moving your furniture around. If any of your floors are uneven, fix them. If there are any pets around you, be aware of where they are. Review your medicines with your doctor. Some medicines can make you feel dizzy. This can increase your chance of falling. Ask your doctor what other things that you can do to help prevent falls. This information is not intended to replace advice given to you by your health care provider. Make sure you discuss any questions you have with your health care provider. Document Released: 03/16/2009 Document Revised: 10/26/2015 Document Reviewed: 06/24/2014 Elsevier Interactive Patient Education  2017 [employer].

## 2021-04-10 ENCOUNTER — Other Ambulatory Visit: Payer: Self-pay

## 2021-04-10 ENCOUNTER — Ambulatory Visit: Payer: Medicare PPO | Admitting: Family Medicine

## 2021-04-10 ENCOUNTER — Encounter: Payer: Self-pay | Admitting: Family Medicine

## 2021-04-10 VITALS — BP 107/57 | HR 60 | Temp 97.9°F | Ht 69.0 in | Wt 164.0 lb

## 2021-04-10 DIAGNOSIS — D696 Thrombocytopenia, unspecified: Secondary | ICD-10-CM

## 2021-04-10 DIAGNOSIS — E1159 Type 2 diabetes mellitus with other circulatory complications: Secondary | ICD-10-CM | POA: Diagnosis not present

## 2021-04-10 DIAGNOSIS — Z794 Long term (current) use of insulin: Secondary | ICD-10-CM

## 2021-04-10 DIAGNOSIS — R3915 Urgency of urination: Secondary | ICD-10-CM | POA: Diagnosis not present

## 2021-04-10 DIAGNOSIS — I152 Hypertension secondary to endocrine disorders: Secondary | ICD-10-CM | POA: Diagnosis not present

## 2021-04-10 DIAGNOSIS — E1140 Type 2 diabetes mellitus with diabetic neuropathy, unspecified: Secondary | ICD-10-CM

## 2021-04-10 LAB — URINALYSIS, ROUTINE W REFLEX MICROSCOPIC
Bilirubin, UA: NEGATIVE
Ketones, UA: NEGATIVE
Leukocytes,UA: NEGATIVE
Nitrite, UA: NEGATIVE
Protein,UA: NEGATIVE
RBC, UA: NEGATIVE
Specific Gravity, UA: <1.005 — ABNORMAL LOW (ref 1.005–1.030)
Urobilinogen, Ur: 0.2 mg/dL (ref 0.2–1.0)
pH, UA: 5.5 (ref 5.0–7.5)

## 2021-04-10 LAB — MICROSCOPIC EXAMINATION
Bacteria, UA: NONE SEEN
Epithelial Cells (non renal): NONE SEEN /hpf (ref 0–10)
RBC, Urine: NONE SEEN /hpf (ref 0–2)
Renal Epithel, UA: NONE SEEN /hpf
WBC, UA: NONE SEEN /hpf (ref 0–5)

## 2021-04-10 LAB — BAYER DCA HB A1C WAIVED: HB A1C (BAYER DCA - WAIVED): 7.6 % — ABNORMAL HIGH (ref 4.8–5.6)

## 2021-04-10 NOTE — Progress Notes (Signed)
Subjective:  [name], male    DOB: 26-Sep-1940, 80 y.o.   MRN: [id number]  Patient Care Team: Baruch Gouty, FNP as PCP - General (Family Medicine) Irine Seal, MD (Urology) Gatha Mayer, MD (Gastroenterology) Minus Breeding, MD as Consulting Physician (Cardiology) Lavera Guise, [facility] (Pharmacist) Harlen Labs, MD as Referring Physician (Optometry)   Chief Complaint:  Diabetes (3 month follow up)   HPI: Joe [name] is a 80 y.o. male presenting on 04/10/2021 for Diabetes (3 month follow up)   Pt presents today for diabetes and hypertension follow up. Pt states he has a few concerns today. Reports he has had urinary urgency and dribbling. States ongoing for several weeks. No dysuria, hematuria, urinary retention, rectal pressure, scrotal pressure or pain, penile discharge, abdominal pain, or flank pain. States he has to get up at least 3 times a night to void.  He reports he does have itching to his torso and axilla, no associated rash or other symptoms. No recent changes in medications or household products.  Blood pressure has been very well controlled at home.  He is having some high blood sugar readings but reports this occurs when he adds additional snacks due to low readings. He is afraid his BS will drop so he will add more sugar to nighttime snack.   Diabetes He presents for his follow-up diabetic visit. He has type 2 diabetes mellitus. His disease course has been stable. There are no hypoglycemic associated symptoms. Pertinent negatives for diabetes include no blurred vision, no chest pain, no fatigue, no foot paresthesias, no foot ulcerations, no polydipsia, no polyphagia, no polyuria, no visual change, no weakness and no weight loss. There are no hypoglycemic complications. Diabetic complications include heart disease, nephropathy, peripheral neuropathy and PVD. Risk factors for coronary artery disease include diabetes mellitus, dyslipidemia,  hypertension and male sex. He is compliant with treatment all of the time. He is following a diabetic and generally healthy diet. He participates in exercise daily. He monitors blood glucose at home 3-4 x per day. Blood glucose monitoring compliance is excellent. His home blood glucose trend is fluctuating minimally. An ACE inhibitor/angiotensin II receptor blocker is being taken. Eye exam is current.    Relevant past medical, surgical, family, and social history reviewed and updated as indicated.  Allergies and medications reviewed and updated. Data reviewed: Chart in Epic.   Past Medical History:  Diagnosis Date   Adenomatous colon polyp    Arthritis    Cataract    Chest pain at rest 05/26/2016   Diabetes mellitus without complication ([place])    Dyslipidemia    ESOPHAGITIS, REFLUX 07/09/2004   Qualifier: Diagnosis of  By: Hardin Negus CMA Deborra Medina), Stephanie     Hiatal hernia    HOH (hard of hearing)    has bilateral Hearing aids   Hypertension    Irritable bowel syndrome    Kidney stone 1970   Pneumonia    Tick bites    took 5 rounds of Doxycycline this summer    Past Surgical History:  Procedure Laterality Date   COLONOSCOPY     ESOPHAGEAL DILATION  1996   ESOPHAGEAL DILATION  2003   FLEXIBLE SIGMOIDOSCOPY     HEMICOLECTOMY  08/2005   Right   KNEE SURGERY Right    TONSILLECTOMY      Social History   Socioeconomic History   Marital status: Married    Spouse name: [name]   Number of  children: 1   Years of education: 16   Highest education level: Bachelor's degree (e.g., BA, AB, BS)  Occupational History   Occupation: Retired Pharmacist, hospital  Tobacco Use   Smoking status: Former    Packs/day: 2.00    Types: Cigarettes    Start date: 06/03/1954    Quit date: 08/24/1984    Years since quitting: 36.6   Smokeless tobacco: Never   Tobacco comments:    Has not used to tobacco products for the past 20 years  Vaping Use   Vaping Use: Never used  Substance and Sexual Activity    Alcohol use: Yes    Alcohol/week: 7.0 standard drinks    Types: 7 Standard drinks or equivalent per week    Comment: [place] at night when he sits on the deck   Drug use: No   Sexual activity: Yes    Partners: Female  Other Topics Concern   Not on file  Social History Narrative   Lives in [place] with wife, both retired teachers   1 son   3 caffeinated beverages daily   1 alcoholic beverage daily   Former smoker no current tobacco no drug use   Social Determinants of Radio broadcast assistant Strain: Low Risk    Difficulty of Paying Living Expenses: Not hard at all  Food Insecurity: No Food Insecurity   Worried About Charity fundraiser in the Last Year: Never true   Arboriculturist in the Last Year: Never true  Transportation Needs: No Transportation Needs   Lack of Transportation (Medical): No   Lack of Transportation (Non-Medical): No  Physical Activity: Insufficiently Active   Days of Exercise per Week: 3 days   Minutes of Exercise per Session: 20 min  Stress: No Stress Concern Present   Feeling of Stress : Not at all  Social Connections: Moderately Isolated   Frequency of Communication with Friends and Family: More than three times a week   Frequency of Social Gatherings with Friends and Family: Once a week   Attends Religious Services: Never   Marine scientist or Organizations: No   Attends Music therapist: Never   Marital Status: Married  Human resources officer Violence: Not At Risk   Fear of Current or Ex-Partner: No   Emotionally Abused: No   Physically Abused: No   Sexually Abused: No    Outpatient Encounter Medications as of 04/10/2021  Medication Sig   Accu-Chek Softclix Lancets lancets CHECK BLOOD SUGAR 4 TIMES A DAY OR AS DIRECTED   aspirin 81 MG tablet Take 81 mg by mouth daily.   Blood Glucose Monitoring Suppl w/Device KIT Test BS BID and PRN dx E11.9. Give test strips and lancets to match machine given   cholecalciferol (VITAMIN D)  1000 UNITS tablet Take 2,000 Units by mouth daily.   Cinnamon 500 MG TABS Take 1,000 tablets by mouth 2 (two) times daily.   Continuous Blood Gluc Receiver (FREESTYLE LIBRE 2 READER) DEVI Use to test blood sugars up to 6 times daily as directed. DX: E11.9   Continuous Blood Gluc Sensor (FREESTYLE LIBRE 2 SENSOR) MISC USE 1 SENSOR TO CHECK BLOOD SUGAR UP TO SIX TIMES DAILY AS DIRECTED   doxylamine, Sleep, (UNISOM) 25 MG tablet Take 25 mg by mouth at bedtime as needed for sleep.    esomeprazole (NEXIUM) 40 MG capsule TAKE (1) CAPSULE DAILY   glucose blood (ONETOUCH ULTRA) test strip Check blood sugar 4 times daily Dx E 11.40  Insulin Pen Needle (PEN NEEDLES) 32G X 5 MM MISC 1 Device by Does not apply route daily.   JANUVIA 100 MG tablet TAKE 1 TABLET DAILY   JARDIANCE 25 MG TABS tablet Take 1 tablet daily.   metFORMIN (GLUCOPHAGE-XR) 500 MG 24 hr tablet TAKE 2 TABLETS 2 TIMES A DAY WITH MEALS   mupirocin ointment (BACTROBAN) 2 % Apply 1 application topically 2 (two) times daily.   olmesartan (BENICAR) 20 MG tablet TAKE 1 TABLET ONCE DAILY   Probiotic Product (PROBIOTIC PO) Take 1 tablet by mouth daily.   simvastatin (ZOCOR) 40 MG tablet TAKE 1 TABLET ONCE DAILY IN THE EVENING   TRESIBA FLEXTOUCH 100 UNIT/ML FlexTouch Pen Inject 0.06-0.2 mLs (6-20 Units total) into the skin daily at 10 pm.   triamcinolone ointment (KENALOG) 0.5 % APPLY TO AFFECTED AREAS TWICE A DAY   [DISCONTINUED] doxycycline (VIBRA-TABS) 100 MG tablet Take 2 tablets as a single dose ONCE per episode IF needed for tick that has been attached for more than 3 days or is engorged.   No facility-administered encounter medications on file as of 04/10/2021.    Allergies  Allergen Reactions   Ace Inhibitors Cough   Trazamine [Trazodone & Diet Manage Prod] Other (See Comments)    nightmares    Review of Systems  Constitutional:  Negative for activity change, appetite change, chills, diaphoresis, fatigue, fever, unexpected weight  change and weight loss.  HENT: Negative.    Eyes: Negative.  Negative for blurred vision.  Respiratory:  Negative for cough, chest tightness and shortness of breath.   Cardiovascular:  Negative for chest pain, palpitations and leg swelling.  Gastrointestinal:  Negative for blood in stool, constipation, diarrhea, nausea and vomiting.  Endocrine: Negative for polydipsia, polyphagia and polyuria.  Genitourinary:  Negative for dysuria, frequency and urgency.  Musculoskeletal:  Negative for arthralgias and myalgias.  Skin:        Pruritis of torso at times  Allergic/Immunologic: Negative.   Neurological:  Negative for weakness.  Hematological: Negative.   Psychiatric/Behavioral:  Negative for hallucinations, sleep disturbance and suicidal ideas.   All other systems reviewed and are negative.      Objective:  BP (!) 107/57   Pulse 60   Temp 97.9 F (36.6 C)   Ht _0  (1.753 m)   Wt 164 lb (74.4 kg)   SpO2 98%   BMI 24.22 kg/m    Wt Readings from Last 3 Encounters:  04/10/21 164 lb (74.4 kg)  04/04/21 168 lb (76.2 kg)  01/05/21 165 lb 3.2 oz (74.9 kg)    Physical Exam Vitals and nursing note reviewed.  Constitutional:      General: He is not in acute distress.    Appearance: Normal appearance. He is well-developed, well-groomed and normal weight. He is not ill-appearing, toxic-appearing or diaphoretic.  HENT:     Head: Normocephalic and atraumatic.     Jaw: There is normal jaw occlusion.     Right Ear: Decreased hearing noted.     Left Ear: Decreased hearing noted.     Ears:     Comments: Bilateral hearing aids    Nose: Nose normal.     Mouth/Throat:     Lips: Pink.     Mouth: Mucous membranes are moist.     Pharynx: Oropharynx is clear. Uvula midline.  Eyes:     General: Lids are normal.     Extraocular Movements: Extraocular movements intact.     Conjunctiva/sclera: Conjunctivae normal.  Pupils: Pupils are equal, round, and reactive to light.  Neck:      Thyroid: No thyroid mass, thyromegaly or thyroid tenderness.     Vascular: No carotid bruit or JVD.     Trachea: Trachea and phonation normal.  Cardiovascular:     Rate and Rhythm: Normal rate and regular rhythm.     Chest Wall: PMI is not displaced.     Pulses: Normal pulses.     Heart sounds: Normal heart sounds. No murmur heard.   No friction rub. No gallop.  Pulmonary:     Effort: Pulmonary effort is normal. No respiratory distress.     Breath sounds: Normal breath sounds. No wheezing.  Abdominal:     General: Bowel sounds are normal. There is no abdominal bruit.     Palpations: Abdomen is soft. There is no hepatomegaly or splenomegaly.  Musculoskeletal:        General: Normal range of motion.     Cervical back: Normal range of motion and neck supple.     Right lower leg: No edema.     Left lower leg: No edema.  Lymphadenopathy:     Cervical: No cervical adenopathy.  Skin:    General: Skin is warm and dry.     Capillary Refill: Capillary refill takes less than 2 seconds.     Coloration: Skin is not cyanotic, jaundiced or pale.     Findings: No rash.  Neurological:     General: No focal deficit present.     Mental Status: He is alert and oriented to person, place, and time.     Cranial Nerves: No cranial nerve deficit.     Sensory: Sensation is intact. No sensory deficit.     Motor: Motor function is intact. No weakness.     Coordination: Coordination is intact. Coordination normal.     Gait: Gait is intact. Gait normal.     Deep Tendon Reflexes: Reflexes are normal and symmetric. Reflexes normal.  Psychiatric:        Attention and Perception: Attention and perception normal.        Mood and Affect: Mood and affect normal.        Speech: Speech normal.        Behavior: Behavior normal. Behavior is cooperative.        Thought Content: Thought content normal.        Cognition and Memory: Cognition and memory normal.        Judgment: Judgment normal.    Results for  orders placed or performed in visit on 01/05/21  CBC with Differential/Platelet  Result Value Ref Range   WBC 5.1 3.4 - 10.8 x10E3/uL   RBC 3.68 (L) 4.14 - 5.80 x10E6/uL   Hemoglobin 11.7 (L) 13.0 - 17.7 g/dL   Hematocrit 34.3 (L) 37.5 - 51.0 %   MCV 93 79 - 97 fL   MCH 31.8 26.6 - 33.0 pg   MCHC 34.1 31.5 - 35.7 g/dL   RDW 12.4 11.6 - 15.4 %   Platelets 122 (L) 150 - 450 x10E3/uL   Neutrophils 64 Not Estab. %   Lymphs 18 Not Estab. %   Monocytes 8 Not Estab. %   Eos 8 Not Estab. %   Basos 2 Not Estab. %   Neutrophils Absolute 3.3 1.4 - 7.0 x10E3/uL   Lymphocytes Absolute 0.9 0.7 - 3.1 x10E3/uL   Monocytes Absolute 0.4 0.1 - 0.9 x10E3/uL   EOS (ABSOLUTE) 0.4 0.0 - 0.4 x10E3/uL  Basophils Absolute 0.1 0.0 - 0.2 x10E3/uL   Immature Granulocytes 0 Not Estab. %   Immature Grans (Abs) 0.0 0.0 - 0.1 x10E3/uL  CMP14+EGFR  Result Value Ref Range   Glucose 218 (H) 65 - 99 mg/dL   BUN 23 8 - 27 mg/dL   Creatinine, Ser 1.31 (H) 0.76 - 1.27 mg/dL   eGFR 55 (L) >59 mL/min/1.73   BUN/Creatinine Ratio 18 10 - 24   Sodium 139 134 - 144 mmol/L   Potassium 4.8 3.5 - 5.2 mmol/L   Chloride 105 96 - 106 mmol/L   CO2 21 20 - 29 mmol/L   Calcium 8.8 8.6 - 10.2 mg/dL   Total Protein 6.1 6.0 - 8.5 g/dL   Albumin 4.0 3.7 - 4.7 g/dL   Globulin, Total 2.1 1.5 - 4.5 g/dL   Albumin/Globulin Ratio 1.9 1.2 - 2.2   Bilirubin Total 0.3 0.0 - 1.2 mg/dL   Alkaline Phosphatase 56 44 - 121 IU/L   AST 15 0 - 40 IU/L   ALT 12 0 - 44 IU/L  Lipid panel  Result Value Ref Range   Cholesterol, Total 112 100 - 199 mg/dL   Triglycerides 227 (H) 0 - 149 mg/dL   HDL 41 >39 mg/dL   VLDL Cholesterol Cal 35 5 - 40 mg/dL   LDL Chol Calc (NIH) 36 0 - 99 mg/dL   Chol/HDL Ratio 2.7 0.0 - 5.0 ratio  Bayer DCA Hb A1c Waived  Result Value Ref Range   HB A1C (BAYER DCA - WAIVED) 6.7 <7.0 %  Microalbumin / creatinine urine ratio  Result Value Ref Range   Creatinine, Urine 37.6 Not Estab. mg/dL   Microalbumin, Urine  <3.0 Not Estab. ug/mL   Microalb/Creat Ratio <8 0 - 29 mg/g creat       Pertinent labs & imaging results that were available during my care of the patient were reviewed by me and considered in my medical decision making.  Assessment & Plan:  Altan was seen today for diabetes.  Diagnoses and all orders for this visit:  Type 2 diabetes mellitus with diabetic neuropathy, with long-term current use of insulin (HCC) A1C 7.6 today. BS readings at home fairly controlled. No hypoglycemic episodes, some elevated readings. Does not wish to change tresiba to ozempic. SS dosing of tresiba at night discussed in detail as pt is concerned when he has low readings. SS information provided to pt and wife. Diet and exercise discussed in detail. Follow up in 3 months for reevaluation.  -     CBC with Differential/Platelet -     Bayer DCA Hb A1c Waived  Urgency of urination Likely due to BPH. Will check urine and PSA. Further treatment pending results.  -     Urinalysis, Routine w reflex microscopic -     PSA, total and free  Thrombocytopenia  Will recheck CBC today. Denies abnormal bleeding or bruising.  Hypertension associated with diabetes  Blood pressure has been very well controlled at home, continue current regimen. DASH diet encouraged.   Continue all other maintenance medications.  Follow up plan: Return in about 3 months (around 07/11/2021), or if symptoms worsen or fail to improve, for DM.   Continue healthy lifestyle choices, including diet (rich in fruits, vegetables, and lean proteins, and low in salt and simple carbohydrates) and exercise (at least 30 minutes of moderate physical activity daily).  Educational handout given for DM  The above assessment and management plan was discussed with the patient. The patient  verbalized understanding of and has agreed to the management plan. Patient is aware to call the clinic if they develop any new symptoms or if symptoms persist or worsen.  Patient is aware when to return to the clinic for a follow-up visit. Patient educated on when it is appropriate to go to the emergency department.   Monia Pouch, FNP-C [facility] [phone]

## 2021-04-10 NOTE — Patient Instructions (Addendum)
SS Insulin 90-110  8 units 111-160 16 units 160+ 20 units    Continue to monitor your blood sugars as we discussed and record them. Bring the log to your next appointment.  Take your medications as directed.    Goal Blood glucose:    Fasting (before meals) = 80 to 130   Within 2 hours of eating = less than 180   Understanding your Hemoglobin A1c:     Diabetes Mellitus and Nutrition    I think that you would greatly benefit from seeing a nutritionist. If this is something you are interested in, please call Dr Jenne Campus at [phone] to schedule an appointment.   When you have diabetes (diabetes mellitus), it is very important to have healthy eating habits because your blood sugar (glucose) levels are greatly affected by what you eat and drink. Eating healthy foods in the appropriate amounts, at about the same times every day, can help you: Control your blood glucose. Lower your risk of heart disease. Improve your blood pressure. Reach or maintain a healthy weight.  Every person with diabetes is different, and each person has different needs for a meal plan. Your health care provider may recommend that you work with a diet and nutrition specialist (dietitian) to make a meal plan that is best for you. Your meal plan may vary depending on factors such as: The calories you need. The medicines you take. Your weight. Your blood glucose, blood pressure, and cholesterol levels. Your activity level. Other health conditions you have, such as heart or kidney disease.  How do carbohydrates affect me? Carbohydrates affect your blood glucose level more than any other type of food. Eating carbohydrates naturally increases the amount of glucose in your blood. Carbohydrate counting is a method for keeping track of how many carbohydrates you eat. Counting carbohydrates is important to keep your blood glucose at a healthy level, especially if you use insulin or take certain oral diabetes  medicines. It is important to know how many carbohydrates you can safely have in each meal. This is different for every person. Your dietitian can help you calculate how many carbohydrates you should have at each meal and for snack. Foods that contain carbohydrates include: Bread, cereal, rice, pasta, and crackers. Potatoes and corn. Peas, beans, and lentils. Milk and yogurt. Fruit and juice. Desserts, such as cakes, cookies, ice cream, and candy.  How does alcohol affect me? Alcohol can cause a sudden decrease in blood glucose (hypoglycemia), especially if you use insulin or take certain oral diabetes medicines. Hypoglycemia can be a life-threatening condition. Symptoms of hypoglycemia (sleepiness, dizziness, and confusion) are similar to symptoms of having too much alcohol. If your health care provider says that alcohol is safe for you, follow these guidelines: Limit alcohol intake to no more than 1 drink per day for nonpregnant women and 2 drinks per day for men. One drink equals 12 oz of beer, 5 oz of wine, or 1 oz of hard liquor. Do not drink on an empty stomach. Keep yourself hydrated with water, diet soda, or unsweetened iced tea. Keep in mind that regular soda, juice, and other mixers may contain a lot of sugar and must be counted as carbohydrates.  What are tips for following this plan?  Reading food labels Start by checking the serving size on the label. The amount of calories, carbohydrates, fats, and other nutrients listed on the label are based on one serving of the food. Many foods contain more than one serving per  package. Check the total grams (g) of carbohydrates in one serving. You can calculate the number of servings of carbohydrates in one serving by dividing the total carbohydrates by 15. For example, if a food has 30 g of total carbohydrates, it would be equal to 2 servings of carbohydrates. Check the number of grams (g) of saturated and trans fats in one serving. Choose  foods that have low or no amount of these fats. Check the number of milligrams (mg) of sodium in one serving. Most people should limit total sodium intake to less than 2,300 mg per day. Always check the nutrition information of foods labeled as "low-fat" or "nonfat". These foods may be higher in added sugar or refined carbohydrates and should be avoided. Talk to your dietitian to identify your daily goals for nutrients listed on the label.  Shopping Avoid buying canned, premade, or processed foods. These foods tend to be high in fat, sodium, and added sugar. Shop around the outside edge of the grocery store. This includes fresh fruits and vegetables, bulk grains, fresh meats, and fresh dairy.  Cooking Use low-heat cooking methods, such as baking, instead of high-heat cooking methods like deep frying. Cook using healthy oils, such as olive, canola, or sunflower oil. Avoid cooking with butter, cream, or high-fat meats.  Meal planning Eat meals and snacks regularly, preferably at the same times every day. Avoid going long periods of time without eating. Eat foods high in fiber, such as fresh fruits, vegetables, beans, and whole grains. Talk to your dietitian about how many servings of carbohydrates you can eat at each meal. Eat 4-6 ounces of lean protein each day, such as lean meat, chicken, fish, eggs, or tofu. 1 ounce is equal to 1 ounce of meat, chicken, or fish, 1 egg, or 1/4 cup of tofu. Eat some foods each day that contain healthy fats, such as avocado, nuts, seeds, and fish.  Lifestyle  Check your blood glucose regularly. Exercise at least 30 minutes 5 or more days each week, or as told by your health care provider. Take medicines as told by your health care provider. Do not use any products that contain nicotine or tobacco, such as cigarettes and e-cigarettes. If you need help quitting, ask your health care provider. Work with a Social worker or diabetes educator to identify strategies to  manage stress and any emotional and social challenges.  What are some questions to ask my health care provider? Do I need to meet with a diabetes educator? Do I need to meet with a dietitian? What number can I call if I have questions? When are the best times to check my blood glucose?  Where to find more information: American Diabetes Association: diabetes.org/food-and-fitness/food Academy of Nutrition and Dietetics: PokerClues.dk [employer] of Diabetes and Digestive and Kidney Diseases (NIH): ContactWire.be  Summary A healthy meal plan will help you control your blood glucose and maintain a healthy lifestyle. Working with a diet and nutrition specialist (dietitian) can help you make a meal plan that is best for you. Keep in mind that carbohydrates and alcohol have immediate effects on your blood glucose levels. It is important to count carbohydrates and to use alcohol carefully. This information is not intended to replace advice given to you by your health care provider. Make sure you discuss any questions you have with your health care provider. Document Released: 02/14/2005 Document Revised: 06/24/2016 Document Reviewed: 06/24/2016 Elsevier Interactive Patient Education  [employer].

## 2021-04-10 NOTE — Progress Notes (Signed)
Virtual Visit via Telephone Note  I connected with  CAMDYN BESKE on 04/04/2021 at 11:15 AM EDT by telephone and verified that I am speaking with the correct person using two identifiers.  Location: Patient: Home Provider: [facility] Persons participating in the virtual visit: patient/Nurse Health Advisor   I discussed the limitations, risks, security and privacy concerns of performing an evaluation and management service by telephone and the availability of in person appointments. The patient expressed understanding and agreed to proceed.  Interactive audio and video telecommunications were attempted between this nurse and patient, however failed, due to patient having technical difficulties OR patient did not have access to video capability.  We continued and completed visit with audio only.  Some vital signs may be absent or patient reported.   Darshay Deupree Dionne Ano, LPN

## 2021-04-11 LAB — CBC WITH DIFFERENTIAL/PLATELET
Basophils Absolute: 0.1 10*3/uL (ref 0.0–0.2)
Basos: 1 %
EOS (ABSOLUTE): 0.7 10*3/uL — ABNORMAL HIGH (ref 0.0–0.4)
Eos: 12 %
Hematocrit: 34.5 % — ABNORMAL LOW (ref 37.5–51.0)
Hemoglobin: 11.7 g/dL — ABNORMAL LOW (ref 13.0–17.7)
Immature Grans (Abs): 0 10*3/uL (ref 0.0–0.1)
Immature Granulocytes: 0 %
Lymphocytes Absolute: 1.1 10*3/uL (ref 0.7–3.1)
Lymphs: 18 %
MCH: 32.3 pg (ref 26.6–33.0)
MCHC: 33.9 g/dL (ref 31.5–35.7)
MCV: 95 fL (ref 79–97)
Monocytes Absolute: 0.5 10*3/uL (ref 0.1–0.9)
Monocytes: 9 %
Neutrophils Absolute: 3.6 10*3/uL (ref 1.4–7.0)
Neutrophils: 60 %
Platelets: 126 10*3/uL — ABNORMAL LOW (ref 150–450)
RBC: 3.62 x10E6/uL — ABNORMAL LOW (ref 4.14–5.80)
RDW: 12.6 % (ref 11.6–15.4)
WBC: 6 10*3/uL (ref 3.4–10.8)

## 2021-04-11 LAB — PSA, TOTAL AND FREE
PSA, Free Pct: 28.4 %
PSA, Free: 1.05 ng/mL
Prostate Specific Ag, Serum: 3.7 ng/mL (ref 0.0–4.0)

## 2021-04-17 ENCOUNTER — Ambulatory Visit: Payer: Medicare PPO | Admitting: Pharmacist

## 2021-04-23 ENCOUNTER — Other Ambulatory Visit: Payer: Self-pay

## 2021-04-23 ENCOUNTER — Ambulatory Visit: Payer: Medicare PPO | Admitting: Nurse Practitioner

## 2021-04-23 ENCOUNTER — Encounter: Payer: Self-pay | Admitting: Nurse Practitioner

## 2021-04-23 VITALS — BP 129/65 | HR 54 | Temp 97.7°F | Ht 69.0 in | Wt 163.0 lb

## 2021-04-23 DIAGNOSIS — M549 Dorsalgia, unspecified: Secondary | ICD-10-CM | POA: Insufficient documentation

## 2021-04-23 DIAGNOSIS — M5489 Other dorsalgia: Secondary | ICD-10-CM | POA: Diagnosis not present

## 2021-04-23 MED ORDER — PREDNISONE 10 MG (21) PO TBPK: ORAL_TABLET | ORAL | 0 refills | Status: DC

## 2021-04-23 MED ORDER — DICLOFENAC SODIUM 1 % EX GEL: 2.0000 g | Freq: Four times a day (QID) | CUTANEOUS | 0 refills | Status: DC

## 2021-04-23 NOTE — Assessment & Plan Note (Signed)
Back pain unresolved in the past 4 days.  Patient mentioned bending over and doing some work and started having worsening back pain after that.  Patient has had back pain in the past.  I advised patient to use heat pad as tolerated, Voltaren gel, prednisone taper.  Rest back and follow-up as needed.  Rx sent to pharmacy education provided to patient with printed handouts given.

## 2021-04-23 NOTE — Progress Notes (Signed)
Acute Office Visit  Subjective:    [name], male    DOB: 10-24-1940, 80 y.o.   MRN: [id number]  Chief Complaint  Patient presents with   Back Pain    x4 days    Back Pain This is a new problem. Episode onset: In the past 4 days. The problem occurs constantly. The problem is unchanged. The pain is present in the lumbar spine. The pain does not radiate. The pain is at a severity of 4/10. The pain is The same all the time. The symptoms are aggravated by bending. Stiffness is present All day. Pertinent negatives include no abdominal pain, bowel incontinence, fever, headaches, leg pain, numbness, paresthesias or tingling. He has tried nothing for the symptoms.    Past Medical History:  Diagnosis Date   Adenomatous colon polyp    Arthritis    Cataract    Chest pain at rest 05/26/2016   Diabetes mellitus without complication ([place])    Dyslipidemia    ESOPHAGITIS, REFLUX 07/09/2004   Qualifier: Diagnosis of  By: Hardin Negus CMA Deborra Medina), Stephanie     Hiatal hernia    HOH (hard of hearing)    has bilateral Hearing aids   Hypertension    Irritable bowel syndrome    Kidney stone 1970   Pneumonia    Tick bites    took 5 rounds of Doxycycline this summer    Past Surgical History:  Procedure Laterality Date   COLONOSCOPY     ESOPHAGEAL DILATION  1996   ESOPHAGEAL DILATION  2003   FLEXIBLE SIGMOIDOSCOPY     HEMICOLECTOMY  08/2005   Right   KNEE SURGERY Right    TONSILLECTOMY      Family History  Problem Relation Age of Onset   Stroke Father 78   Hyperlipidemia Father    Hypertension Father    Heart disease Brother    Cancer Brother        lung    Hyperlipidemia Brother    Hypertension Brother    Stroke Brother    Diabetes Brother    Breast cancer Mother    Colon cancer Neg Hx    Colon polyps Neg Hx    Kidney disease Neg Hx    Esophageal cancer Neg Hx    Stomach cancer Neg Hx    Rectal cancer Neg Hx     Social History   Socioeconomic History    Marital status: Married    Spouse name: [name]   Number of children: 1   Years of education: 16   Highest education level: Bachelor's degree (e.g., BA, AB, BS)  Occupational History   Occupation: Retired Pharmacist, hospital  Tobacco Use   Smoking status: Former    Packs/day: 2.00    Types: Cigarettes    Start date: 06/03/1954    Quit date: 08/24/1984    Years since quitting: 36.6   Smokeless tobacco: Never   Tobacco comments:    Has not used to tobacco products for the past 20 years  Vaping Use   Vaping Use: Never used  Substance and Sexual Activity   Alcohol use: Yes    Alcohol/week: 7.0 standard drinks    Types: 7 Standard drinks or equivalent per week    Comment: [place] at night when he sits on the deck   Drug use: No   Sexual activity: Yes    Partners: Female  Other Topics Concern   Not on file  Social History Narrative   Lives in  [place] with wife, both retired Tour manager   1 son   3 caffeinated beverages daily   1 alcoholic beverage daily   Former smoker no current tobacco no drug use   Social Determinants of Radio broadcast assistant Strain: Low Risk    Difficulty of Paying Living Expenses: Not hard at all  Food Insecurity: No Food Insecurity   Worried About Charity fundraiser in the Last Year: Never true   Arboriculturist in the Last Year: Never true  Transportation Needs: No Transportation Needs   Lack of Transportation (Medical): No   Lack of Transportation (Non-Medical): No  Physical Activity: Insufficiently Active   Days of Exercise per Week: 3 days   Minutes of Exercise per Session: 20 min  Stress: No Stress Concern Present   Feeling of Stress : Not at all  Social Connections: Moderately Isolated   Frequency of Communication with Friends and Family: More than three times a week   Frequency of Social Gatherings with Friends and Family: Once a week   Attends Religious Services: Never   Marine scientist or Organizations: No   Attends Arts administrator: Never   Marital Status: Married  Human resources officer Violence: Not At Risk   Fear of Current or Ex-Partner: No   Emotionally Abused: No   Physically Abused: No   Sexually Abused: No    Outpatient Medications Prior to Visit  Medication Sig Dispense Refill   Accu-Chek Softclix Lancets lancets CHECK BLOOD SUGAR 4 TIMES A DAY OR AS DIRECTED     aspirin 81 MG tablet Take 81 mg by mouth daily.     Blood Glucose Monitoring Suppl w/Device KIT Test BS BID and PRN dx E11.9. Give test strips and lancets to match machine given 1 each 1   cholecalciferol (VITAMIN D) 1000 UNITS tablet Take 2,000 Units by mouth daily.     Cinnamon 500 MG TABS Take 1,000 tablets by mouth 2 (two) times daily.     Continuous Blood Gluc Receiver (FREESTYLE LIBRE 2 READER) DEVI Use to test blood sugars up to 6 times daily as directed. DX: E11.9 1 each 0   Continuous Blood Gluc Sensor (FREESTYLE LIBRE 2 SENSOR) MISC USE 1 SENSOR TO CHECK BLOOD SUGAR UP TO SIX TIMES DAILY AS DIRECTED 2 each 11   doxylamine, Sleep, (UNISOM) 25 MG tablet Take 25 mg by mouth at bedtime as needed for sleep.      esomeprazole (NEXIUM) 40 MG capsule TAKE (1) CAPSULE DAILY 30 capsule 5   glucose blood (ONETOUCH ULTRA) test strip Check blood sugar 4 times daily Dx E 11.40 200 strip 11   Insulin Pen Needle (PEN NEEDLES) 32G X 5 MM MISC 1 Device by Does not apply route daily. 100 each 3   JANUVIA 100 MG tablet TAKE 1 TABLET DAILY 90 tablet 0   JARDIANCE 25 MG TABS tablet Take 1 tablet daily. 90 tablet 0   metFORMIN (GLUCOPHAGE-XR) 500 MG 24 hr tablet TAKE 2 TABLETS 2 TIMES A DAY WITH MEALS 360 tablet 0   mupirocin ointment (BACTROBAN) 2 % Apply 1 application topically 2 (two) times daily. 22 g 1   olmesartan (BENICAR) 20 MG tablet TAKE 1 TABLET ONCE DAILY 90 tablet 0   Probiotic Product (PROBIOTIC PO) Take 1 tablet by mouth daily.     simvastatin (ZOCOR) 40 MG tablet TAKE 1 TABLET ONCE DAILY IN THE EVENING 90 tablet 0   TRESIBA FLEXTOUCH 100  UNIT/ML FlexTouch  Pen Inject 0.06-0.2 mLs (6-20 Units total) into the skin daily at 10 pm. 15 mL 0   triamcinolone ointment (KENALOG) 0.5 % APPLY TO AFFECTED AREAS TWICE A DAY 30 g 2   No facility-administered medications prior to visit.    Allergies  Allergen Reactions   Ace Inhibitors Cough   Trazamine [Trazodone & Diet Manage Prod] Other (See Comments)    nightmares    Review of Systems  Constitutional:  Negative for fever.  HENT: Negative.    Eyes: Negative.   Respiratory: Negative.    Cardiovascular: Negative.   Gastrointestinal: Negative.  Negative for abdominal pain and bowel incontinence.  Genitourinary: Negative.   Musculoskeletal:  Positive for back pain.  Skin:  Negative for rash.  Neurological:  Negative for tingling, numbness, headaches and paresthesias.  Psychiatric/Behavioral: Negative.    All other systems reviewed and are negative.     Objective:    Physical Exam Vitals reviewed.  Constitutional:      Appearance: Normal appearance.  HENT:     Head: Normocephalic.     Right Ear: External ear normal.     Left Ear: External ear normal.     Nose: Nose normal.  Cardiovascular:     Rate and Rhythm: Normal rate and regular rhythm.  Pulmonary:     Effort: Pulmonary effort is normal.     Breath sounds: Normal breath sounds.  Abdominal:     General: Bowel sounds are normal.  Musculoskeletal:     Lumbar back: Tenderness present. Decreased range of motion.  Skin:    General: Skin is warm.     Findings: No rash.  Neurological:     Mental Status: He is alert and oriented to person, place, and time.    BP 129/65   Pulse (!) 54   Temp 97.7 F (36.5 C)   Ht 5' 9"  (1.753 m)   Wt 163 lb (73.9 kg)   SpO2 100%   BMI 24.07 kg/m  Wt Readings from Last 3 Encounters:  04/23/21 163 lb (73.9 kg)  04/10/21 164 lb (74.4 kg)  04/04/21 168 lb (76.2 kg)    Health Maintenance Due  Topic Date Due   Zoster Vaccines- Shingrix (1 of 2) Never done    There are no  preventive care reminders to display for this patient.   Lab Results  Component Value Date   TSH 0.918 03/07/2017   Lab Results  Component Value Date   WBC 6.0 04/10/2021   HGB 11.7 (L) 04/10/2021   HCT 34.5 (L) 04/10/2021   MCV 95 04/10/2021   PLT 126 (L) 04/10/2021   Lab Results  Component Value Date   NA 139 01/05/2021   K 4.8 01/05/2021   CO2 21 01/05/2021   GLUCOSE 218 (H) 01/05/2021   BUN 23 01/05/2021   CREATININE 1.31 (H) 01/05/2021   BILITOT 0.3 01/05/2021   ALKPHOS 56 01/05/2021   AST 15 01/05/2021   ALT 12 01/05/2021   PROT 6.1 01/05/2021   ALBUMIN 4.0 01/05/2021   CALCIUM 8.8 01/05/2021   ANIONGAP 9 05/26/2016   EGFR 55 (L) 01/05/2021   Lab Results  Component Value Date   CHOL 112 01/05/2021   Lab Results  Component Value Date   HDL 41 01/05/2021   Lab Results  Component Value Date   LDLCALC 36 01/05/2021   Lab Results  Component Value Date   TRIG 227 (H) 01/05/2021   Lab Results  Component Value Date   CHOLHDL 2.7 01/05/2021  Lab Results  Component Value Date   HGBA1C 7.6 (H) 04/10/2021       Assessment & Plan:   Problem List Items Addressed This Visit       Other   Back pain without sciatica - Primary    Back pain unresolved in the past 4 days.  Patient mentioned bending over and doing some work and started having worsening back pain after that.  Patient has had back pain in the past.  I advised patient to use heat pad as tolerated, Voltaren gel, prednisone taper.  Rest back and follow-up as needed.  Rx sent to pharmacy education provided to patient with printed handouts given.      Relevant Medications   diclofenac Sodium (VOLTAREN) 1 % GEL   predniSONE (STERAPRED UNI-PAK 21 TAB) 10 MG (21) TBPK tablet     Meds ordered this encounter  Medications   diclofenac Sodium (VOLTAREN) 1 % GEL    Sig: Apply 2 g topically 4 (four) times daily.    Dispense:  100 g    Refill:  0    Order Specific Question:   Supervising Provider     Answer:   Jeneen Rinks   predniSONE (STERAPRED UNI-PAK 21 TAB) 10 MG (21) TBPK tablet    Sig: 6  tablets day 1, 5 tablets day 2, 4 tablet day 3, 3 tablet day 4, 2 tablet day 5, 1 tablet 1    Dispense:  1 each    Refill:  0    Order Specific Question:   Supervising Provider    AnswerJeneen Rinks     Ivy Lynn, NP

## 2021-04-23 NOTE — Patient Instructions (Signed)

## 2021-05-01 ENCOUNTER — Ambulatory Visit: Payer: Medicare PPO | Admitting: Pharmacist

## 2021-05-01 DIAGNOSIS — E1140 Type 2 diabetes mellitus with diabetic neuropathy, unspecified: Secondary | ICD-10-CM

## 2021-05-01 DIAGNOSIS — Z794 Long term (current) use of insulin: Secondary | ICD-10-CM

## 2021-05-01 NOTE — Progress Notes (Signed)
    05/01/2021 Name: SLADEN PLANCARTE MRN: [id number] DOB: [birth year]   S:  2 yoM Presents for diabetes evaluation, education, and management Patient was referred and last seen by Primary Care Provider on 04/10/21.   Patient uses freestlye libre 2 CGM system.  Of note, he is using topical nasal steroid nasal spray (flonase) to avoid Allergic reaction to CGM adhesive.  Insurance coverage/medication affordability: HUMANA  Patient reports adherence with medications. Current diabetes medications include: JANUVIA 100MG , METFORMIN, TRESIBA, JARDIANCE 25MG  Current hypertension medications include: OLMESARTAN Goal 130/80 Current hyperlipidemia medications include: SIMVASTATIN   Patient reports hypoglycemic events.   Patient reported dietary habits: Eats 3 meals/day Discussed meal planning options and Plate method for healthy eating Avoid sugary drinks and desserts Incorporate balanced protein, non starchy veggies, 1 serving of carbohydrate with each meal Increase water intake Increase physical activity as able  Patient-reported exercise habits: n/a, elliptical at times   Patient reports nocturia (nighttime urination).  Patient reports neuropathy (nerve pain).  Patient denies visual changes.  Patient reports self foot exams.    O:  Lab Results  Component Value Date   HGBA1C 7.6 (H) 04/10/2021    Lipid Panel     Component Value Date/Time   CHOL 112 01/05/2021 1337   CHOL 105 12/24/2012 0850   TRIG 227 (H) 01/05/2021 1337   TRIG 207 (H) 10/29/2016 1100   TRIG 46 12/24/2012 0850   HDL 41 01/05/2021 1337   HDL 34 (L) 10/29/2016 1100   HDL 44 12/24/2012 0850   CHOLHDL 2.7 01/05/2021 1337   CHOLHDL 2.8 05/26/2016 0349   VLDL 20 05/26/2016 0349   LDLCALC 36 01/05/2021 1337   LDLCALC 59 02/03/2014 0840   LDLCALC 52 12/24/2012 0850     Home fasting blood sugars: 80-upper 100s (recent prednisone burst)  2 hour post-meal/random blood sugars: 200-300.    Clinical  Atherosclerotic Cardiovascular Disease (ASCVD): No   The ASCVD Risk score (Arnett DK, et al., 2019) failed to calculate for the following reasons:   The valid total cholesterol range is 130 to 320 mg/dL    A/P:  Diabetes T2DM currently UNCONTROLLED. Patient is able to verbalize appropriate hypoglycemia management plan. Patient is adherent with medication. Control is suboptimal due to DIET.  -Recent prednisone burst for back pain-->evening blood sugars via libre >350+.  Fastings have corrected to <160 for the past two mornings.  Would consider meal time insulin if numbers don't correct or patient needs additional steroids, but patient appears to be self-correcting.  On average he is giving 20 units of Tresiba 20 units nightly.  -Continue all other medications as prescribed.  No changes   -discussed eating tablespoon of peanut butter at night (before bed) if blood sugars decline to hold patient through the night  -discussed potential of transitioning from [place] to rybelsus in the future for more steady control, but patient concerned about weight lost  -Extensively discussed pathophysiology of diabetes, recommended lifestyle interventions, dietary effects on blood sugar control  -Counseled on s/sx of and management of hypoglycemia  -Next A1C anticipated 3 MONTHS.  Written patient instructions provided.  Total time in face to face counseling 25 minutes.     Regina Eck, PharmD, BCPS Clinical Pharmacist, [place]  II Phone [phone]

## 2021-05-08 ENCOUNTER — Other Ambulatory Visit: Payer: Self-pay | Admitting: Family Medicine

## 2021-05-08 DIAGNOSIS — E1140 Type 2 diabetes mellitus with diabetic neuropathy, unspecified: Secondary | ICD-10-CM | POA: Diagnosis not present

## 2021-06-04 ENCOUNTER — Other Ambulatory Visit: Payer: Self-pay | Admitting: Family Medicine

## 2021-06-04 DIAGNOSIS — I700 Atherosclerosis of aorta: Secondary | ICD-10-CM

## 2021-06-04 DIAGNOSIS — Z794 Long term (current) use of insulin: Secondary | ICD-10-CM

## 2021-06-04 DIAGNOSIS — E1169 Type 2 diabetes mellitus with other specified complication: Secondary | ICD-10-CM

## 2021-06-04 DIAGNOSIS — E1140 Type 2 diabetes mellitus with diabetic neuropathy, unspecified: Secondary | ICD-10-CM

## 2021-07-12 ENCOUNTER — Encounter: Payer: Self-pay | Admitting: Family Medicine

## 2021-07-12 ENCOUNTER — Ambulatory Visit: Payer: Medicare PPO | Admitting: Family Medicine

## 2021-07-12 ENCOUNTER — Telehealth: Payer: Self-pay | Admitting: Family Medicine

## 2021-07-12 VITALS — BP 127/66 | HR 62 | Temp 98.1°F | Ht 69.0 in | Wt 165.1 lb

## 2021-07-12 DIAGNOSIS — E1140 Type 2 diabetes mellitus with diabetic neuropathy, unspecified: Secondary | ICD-10-CM | POA: Diagnosis not present

## 2021-07-12 DIAGNOSIS — E1159 Type 2 diabetes mellitus with other circulatory complications: Secondary | ICD-10-CM | POA: Diagnosis not present

## 2021-07-12 DIAGNOSIS — I152 Hypertension secondary to endocrine disorders: Secondary | ICD-10-CM

## 2021-07-12 DIAGNOSIS — Z794 Long term (current) use of insulin: Secondary | ICD-10-CM

## 2021-07-12 DIAGNOSIS — I700 Atherosclerosis of aorta: Secondary | ICD-10-CM

## 2021-07-12 DIAGNOSIS — N183 Chronic kidney disease, stage 3 unspecified: Secondary | ICD-10-CM

## 2021-07-12 DIAGNOSIS — N401 Enlarged prostate with lower urinary tract symptoms: Secondary | ICD-10-CM

## 2021-07-12 DIAGNOSIS — E785 Hyperlipidemia, unspecified: Secondary | ICD-10-CM | POA: Diagnosis not present

## 2021-07-12 DIAGNOSIS — E1122 Type 2 diabetes mellitus with diabetic chronic kidney disease: Secondary | ICD-10-CM | POA: Diagnosis not present

## 2021-07-12 DIAGNOSIS — E1169 Type 2 diabetes mellitus with other specified complication: Secondary | ICD-10-CM

## 2021-07-12 DIAGNOSIS — D696 Thrombocytopenia, unspecified: Secondary | ICD-10-CM | POA: Diagnosis not present

## 2021-07-12 DIAGNOSIS — R350 Frequency of micturition: Secondary | ICD-10-CM

## 2021-07-12 LAB — LIPID PANEL

## 2021-07-12 LAB — BAYER DCA HB A1C WAIVED: HB A1C (BAYER DCA - WAIVED): 8.8 % — ABNORMAL HIGH (ref 4.8–5.6)

## 2021-07-12 MED ORDER — TAMSULOSIN HCL 0.4 MG PO CAPS: 0.4000 mg | ORAL_CAPSULE | Freq: Every day | ORAL | 3 refills | Status: DC

## 2021-07-12 NOTE — Patient Instructions (Addendum)
It was a pleasure seeing you today, Joe [name].  Information regarding what we discussed is included in this packet.  Please make an appointment to see me in 3 months.    If you had labs performed today, you will be contacted with the abnormal results once they are available, usually in the next 3 business days for routine lab work.  If you had STI testing, a pap smear, or a biopsy performed, expect to be contacted in about 7-10 days. If results are normal, you will not be notified.    In a few days you may receive a survey in the mail or online from [employer] regarding your visit with [place] today. Please take a moment to fill this out. Your feedback is very important to our office. It can help [place] better understand your needs as well as improve your experience and satisfaction. Thank you for taking your time to complete it. We care about you.   Please feel free to call our office if any questions or concerns arise.  Continue to monitor your blood sugars as we discussed and record them. Bring the log to your next appointment.  Take your medications as directed.    Goal Blood glucose:    Fasting (before meals) = 80 to 130   Within 2 hours of eating = less than 180   Understanding your Hemoglobin A1c: 8.8     Diabetes Mellitus and Nutrition    I think that you would greatly benefit from seeing a nutritionist. If this is something you are interested in, please call Dr Jenne Campus at [phone] to schedule an appointment.   When you have diabetes (diabetes mellitus), it is very important to have healthy eating habits because your blood sugar (glucose) levels are greatly affected by what you eat and drink. Eating healthy foods in the appropriate amounts, at about the same times every day, can help you: Control your blood glucose. Lower your risk of heart disease. Improve your blood pressure. Reach or maintain a healthy weight.  Every person with diabetes is different, and each person has  different needs for a meal plan. Your health care provider may recommend that you work with a diet and nutrition specialist (dietitian) to make a meal plan that is best for you. Your meal plan may vary depending on factors such as: The calories you need. The medicines you take. Your weight. Your blood glucose, blood pressure, and cholesterol levels. Your activity level. Other health conditions you have, such as heart or kidney disease.  How do carbohydrates affect me? Carbohydrates affect your blood glucose level more than any other type of food. Eating carbohydrates naturally increases the amount of glucose in your blood. Carbohydrate counting is a method for keeping track of how many carbohydrates you eat. Counting carbohydrates is important to keep your blood glucose at a healthy level, especially if you use insulin or take certain oral diabetes medicines. It is important to know how many carbohydrates you can safely have in each meal. This is different for every person. Your dietitian can help you calculate how many carbohydrates you should have at each meal and for snack. Foods that contain carbohydrates include: Bread, cereal, rice, pasta, and crackers. Potatoes and corn. Peas, beans, and lentils. Milk and yogurt. Fruit and juice. Desserts, such as cakes, cookies, ice cream, and candy.  How does alcohol affect me? Alcohol can cause a sudden decrease in blood glucose (hypoglycemia), especially if you use insulin or take certain oral diabetes medicines.  Hypoglycemia can be a life-threatening condition. Symptoms of hypoglycemia (sleepiness, dizziness, and confusion) are similar to symptoms of having too much alcohol. If your health care provider says that alcohol is safe for you, follow these guidelines: Limit alcohol intake to no more than 1 drink per day for nonpregnant women and 2 drinks per day for men. One drink equals 12 oz of beer, 5 oz of wine, or 1 oz of hard liquor. Do not drink  on an empty stomach. Keep yourself hydrated with water, diet soda, or unsweetened iced tea. Keep in mind that regular soda, juice, and other mixers may contain a lot of sugar and must be counted as carbohydrates.  What are tips for following this plan?  Reading food labels Start by checking the serving size on the label. The amount of calories, carbohydrates, fats, and other nutrients listed on the label are based on one serving of the food. Many foods contain more than one serving per package. Check the total grams (g) of carbohydrates in one serving. You can calculate the number of servings of carbohydrates in one serving by dividing the total carbohydrates by 15. For example, if a food has 30 g of total carbohydrates, it would be equal to 2 servings of carbohydrates. Check the number of grams (g) of saturated and trans fats in one serving. Choose foods that have low or no amount of these fats. Check the number of milligrams (mg) of sodium in one serving. Most people should limit total sodium intake to less than 2,300 mg per day. Always check the nutrition information of foods labeled as "low-fat" or "nonfat". These foods may be higher in added sugar or refined carbohydrates and should be avoided. Talk to your dietitian to identify your daily goals for nutrients listed on the label.  Shopping Avoid buying canned, premade, or processed foods. These foods tend to be high in fat, sodium, and added sugar. Shop around the outside edge of the grocery store. This includes fresh fruits and vegetables, bulk grains, fresh meats, and fresh dairy.  Cooking Use low-heat cooking methods, such as baking, instead of high-heat cooking methods like deep frying. Cook using healthy oils, such as olive, canola, or sunflower oil. Avoid cooking with butter, cream, or high-fat meats.  Meal planning Eat meals and snacks regularly, preferably at the same times every day. Avoid going long periods of time without  eating. Eat foods high in fiber, such as fresh fruits, vegetables, beans, and whole grains. Talk to your dietitian about how many servings of carbohydrates you can eat at each meal. Eat 4-6 ounces of lean protein each day, such as lean meat, chicken, fish, eggs, or tofu. 1 ounce is equal to 1 ounce of meat, chicken, or fish, 1 egg, or 1/4 cup of tofu. Eat some foods each day that contain healthy fats, such as avocado, nuts, seeds, and fish.  Lifestyle  Check your blood glucose regularly. Exercise at least 30 minutes 5 or more days each week, or as told by your health care provider. Take medicines as told by your health care provider. Do not use any products that contain nicotine or tobacco, such as cigarettes and e-cigarettes. If you need help quitting, ask your health care provider. Work with a Social worker or diabetes educator to identify strategies to manage stress and any emotional and social challenges.  What are some questions to ask my health care provider? Do I need to meet with a diabetes educator? Do I need to meet with a  dietitian? What number can I call if I have questions? When are the best times to check my blood glucose?  Where to find more information: American Diabetes Association: diabetes.org/food-and-fitness/food Academy of Nutrition and Dietetics: PokerClues.dk [employer] of Diabetes and Digestive and Kidney Diseases (NIH): ContactWire.be  Summary A healthy meal plan will help you control your blood glucose and maintain a healthy lifestyle. Working with a diet and nutrition specialist (dietitian) can help you make a meal plan that is best for you. Keep in mind that carbohydrates and alcohol have immediate effects on your blood glucose levels. It is important to count carbohydrates and to use alcohol carefully. This information is not intended  to replace advice given to you by your health care provider. Make sure you discuss any questions you have with your health care provider. Document Released: 02/14/2005 Document Revised: 06/24/2016 Document Reviewed: 06/24/2016 Elsevier Interactive Patient Education  2018 [place], Monia Pouch, FNP-C [facility] [address] [place], [place] [address] [phone]

## 2021-07-12 NOTE — Progress Notes (Addendum)
Subjective:  [name], male    DOB: 19-May-1941, 81 y.o.   MRN: [id number]  Patient Care Team: Baruch Gouty, FNP as PCP - General (Family Medicine) Irine Seal, MD (Urology) Gatha Mayer, MD (Gastroenterology) Minus Breeding, MD as Consulting Physician (Cardiology) Lavera Guise, [facility] (Pharmacist) Harlen Labs, MD as Referring Physician (Optometry)   Chief Complaint:  Medical Management of Chronic Issues, Hyperlipidemia, Diabetes, and Hypertension   HPI: Joe [name] is a 81 y.o. male presenting on 07/12/2021 for Medical Management of Chronic Issues, Hyperlipidemia, Diabetes, and Hypertension   Joe [name] presents for a chronic follow up. Joe [name] review shows daily average 223. A1C has increased to 8.8 today from   Hyperlipidemia Exacerbating diseases include chronic renal disease. Pertinent negatives include no chest pain or shortness of breath.  Diabetes He presents for his follow-up diabetic visit. He has type 2 diabetes mellitus. Pertinent negatives for hypoglycemia include no confusion. Associated symptoms include polyuria. Pertinent negatives for diabetes include no blurred vision, no chest pain, no fatigue, no polydipsia, no polyphagia and no weakness. Symptoms are stable. He is following a diabetic diet. His lunch blood glucose range is generally >200 mg/dl. He sees a podiatrist (appointment upcoming).Eye exam is current.  Hypertension Pertinent negatives include no blurred vision, chest pain, palpitations or shortness of breath. Identifiable causes of hypertension include chronic renal disease.       Relevant past medical, surgical, family, and social history reviewed and updated as indicated.  Allergies and medications reviewed and updated. Data reviewed: Chart in Epic.   Past Medical History:  Diagnosis Date   Adenomatous colon polyp    Arthritis    Cataract    Chest pain at rest 05/26/2016   Diabetes mellitus without complication ([place])     Dyslipidemia    ESOPHAGITIS, REFLUX 07/09/2004   Qualifier: Diagnosis of  By: Hardin Negus CMA Deborra Medina), Stephanie     Hiatal hernia    HOH (hard of hearing)    has bilateral Hearing aids   Hypertension    Irritable bowel syndrome    Kidney stone 1970   Pneumonia    Tick bites    took 5 rounds of Doxycycline this summer    Past Surgical History:  Procedure Laterality Date   COLONOSCOPY     ESOPHAGEAL DILATION  1996   ESOPHAGEAL DILATION  2003   FLEXIBLE SIGMOIDOSCOPY     HEMICOLECTOMY  08/2005   Right   KNEE SURGERY Right    TONSILLECTOMY      Social History   Socioeconomic History   Marital status: Married    Spouse name: Joe [name]   Number of children: 1   Years of education: 16   Highest education level: Bachelor's degree (e.g., BA, AB, BS)  Occupational History   Occupation: Retired Pharmacist, hospital  Tobacco Use   Smoking status: Former    Packs/day: 2.00    Types: Cigarettes    Start date: 06/03/1954    Quit date: 08/24/1984    Years since quitting: 36.9   Smokeless tobacco: Never   Tobacco comments:    Has not used to tobacco products for the past 20 years  Vaping Use   Vaping Use: Never used  Substance and Sexual Activity   Alcohol use: Yes    Alcohol/week: 7.0 standard drinks    Types: 7 Standard drinks or equivalent per week    Comment: [place] at night when he sits on the deck   Drug  use: No   Sexual activity: Yes    Partners: Female  Other Topics Concern   Not on file  Social History Narrative   Lives in [place] with wife, both retired teachers   1 son   3 caffeinated beverages daily   1 alcoholic beverage daily   Former smoker no current tobacco no drug use   Social Determinants of Radio broadcast assistant Strain: Low Risk    Difficulty of Paying Living Expenses: Not hard at all  Food Insecurity: No Food Insecurity   Worried About Charity fundraiser in the Last Year: Never true   Arboriculturist in the Last Year: Never true  Transportation Needs:  No Transportation Needs   Lack of Transportation (Medical): No   Lack of Transportation (Non-Medical): No  Physical Activity: Insufficiently Active   Days of Exercise per Week: 3 days   Minutes of Exercise per Session: 20 min  Stress: No Stress Concern Present   Feeling of Stress : Not at all  Social Connections: Moderately Isolated   Frequency of Communication with Friends and Family: More than three times a week   Frequency of Social Gatherings with Friends and Family: Once a week   Attends Religious Services: Never   Marine scientist or Organizations: No   Attends Music therapist: Never   Marital Status: Married  Human resources officer Violence: Not At Risk   Fear of Current or Ex-Partner: No   Emotionally Abused: No   Physically Abused: No   Sexually Abused: No    Outpatient Encounter Medications as of 07/12/2021  Medication Sig   Accu-Chek Softclix Lancets lancets CHECK BLOOD SUGAR 4 TIMES A DAY OR AS DIRECTED   aspirin 81 MG tablet Take 81 mg by mouth daily.   Blood Glucose Monitoring Suppl w/Device KIT Test BS BID and PRN dx E11.9. Give test strips and lancets to match machine given   cholecalciferol (VITAMIN D) 1000 UNITS tablet Take 2,000 Units by mouth daily.   Cinnamon 500 MG TABS Take 1,000 mg by mouth daily.   Continuous Blood Gluc Receiver (FREESTYLE LIBRE 2 READER) DEVI Use to test blood sugars up to 6 times daily as directed. DX: E11.9   Continuous Blood Gluc Sensor (FREESTYLE LIBRE 2 SENSOR) MISC USE 1 SENSOR TO CHECK BLOOD SUGAR UP TO SIX TIMES DAILY AS DIRECTED   diclofenac Sodium (VOLTAREN) 1 % GEL Apply 2 g topically 4 (four) times daily.   diphenhydrAMINE (BENADRYL) 25 MG tablet Take 25 mg by mouth every 6 (six) hours as needed.   doxylamine, Sleep, (UNISOM) 25 MG tablet Take 25 mg by mouth at bedtime as needed for sleep.    esomeprazole (NEXIUM) 40 MG capsule TAKE (1) CAPSULE DAILY   glucose blood (ONETOUCH ULTRA) test strip Check blood sugar 4  times daily Dx E 11.40   Insulin Pen Needle (PEN NEEDLES) 32G X 5 MM MISC 1 Device by Does not apply route daily.   JANUVIA 100 MG tablet TAKE 1 TABLET DAILY   JARDIANCE 25 MG TABS tablet Take 1 tablet daily.   MELATONIN PO Take by mouth.   metFORMIN (GLUCOPHAGE-XR) 500 MG 24 hr tablet TAKE 2 TABLETS 2 TIMES A DAY WITH MEALS   mupirocin ointment (BACTROBAN) 2 % Apply 1 application topically 2 (two) times daily.   olmesartan (BENICAR) 20 MG tablet TAKE 1 TABLET ONCE DAILY   Probiotic Product (PROBIOTIC PO) Take 1 tablet by mouth daily.   simvastatin (ZOCOR)  40 MG tablet TAKE 1 TABLET ONCE DAILY IN THE EVENING   tamsulosin (FLOMAX) 0.4 MG CAPS capsule Take 1 capsule (0.4 mg total) by mouth at bedtime.   TRESIBA FLEXTOUCH 100 UNIT/ML FlexTouch Pen Inject 0.06-0.2 mLs (6-20 Units total) into the skin daily at 10 pm.   triamcinolone ointment (KENALOG) 0.5 % APPLY TO AFFECTED AREAS TWICE A DAY   [DISCONTINUED] predniSONE (STERAPRED UNI-PAK 21 TAB) 10 MG (21) TBPK tablet 6  tablets day 1, 5 tablets day 2, 4 tablet day 3, 3 tablet day 4, 2 tablet day 5, 1 tablet 1   No facility-administered encounter medications on file as of 07/12/2021.    Allergies  Allergen Reactions   Ace Inhibitors Cough   Trazamine [Trazodone & Diet Manage Prod] Other (See Comments)    nightmares    Review of Systems  Constitutional:  Negative for activity change, appetite change, chills, diaphoresis, fatigue, fever and unexpected weight change.  Eyes:  Negative for blurred vision.  Respiratory:  Negative for shortness of breath.   Cardiovascular:  Negative for chest pain, palpitations and leg swelling.  Endocrine: Positive for polyuria. Negative for polydipsia and polyphagia.  Genitourinary:  Negative for decreased urine volume.  Neurological:  Positive for numbness (RLE, LLE). Negative for weakness.  Psychiatric/Behavioral:  Negative for confusion.   All other systems reviewed and are negative.      Objective:   BP 127/66    Pulse 62    Temp 98.1 F (36.7 C) (Temporal)    Ht 5' 9"  (1.753 m)    Wt 74.9 kg    BMI 24.38 kg/m    Wt Readings from Last 3 Encounters:  07/12/21 74.9 kg  04/23/21 73.9 kg  04/10/21 74.4 kg    Physical Exam Vitals and nursing note reviewed.  Constitutional:      General: He is not in acute distress.    Appearance: Normal appearance. He is normal weight. He is not ill-appearing, toxic-appearing or diaphoretic.  HENT:     Head: Normocephalic and atraumatic.  Eyes:     Conjunctiva/sclera: Conjunctivae normal.     Pupils: Pupils are equal, round, and reactive to light.  Cardiovascular:     Rate and Rhythm: Normal rate and regular rhythm.  Pulmonary:     Effort: Pulmonary effort is normal.     Breath sounds: Normal breath sounds.  Musculoskeletal:        General: Normal range of motion.  Skin:    General: Skin is warm and dry.     Capillary Refill: Capillary refill takes less than 2 seconds.  Neurological:     General: No focal deficit present.     Mental Status: He is alert. Mental status is at baseline.  Psychiatric:        Mood and Affect: Mood normal.        Behavior: Behavior normal.        Thought Content: Thought content normal.        Judgment: Judgment normal.    Results for orders placed or performed in visit on 07/12/21  Bayer DCA Hb A1c Waived  Result Value Ref Range   HB A1C (BAYER DCA - WAIVED) 8.8 (H) 4.8 - 5.6 %       Pertinent labs & imaging results that were available during my care of the patient were reviewed by me and considered in my medical decision making.  Assessment & Plan:  Yordy was seen today for medical management of chronic issues, hyperlipidemia, diabetes  and hypertension.  Diagnoses and all orders for this visit:  Type 2 diabetes mellitus with diabetic neuropathy, with long-term current use of insulin ([place]) -     CBC with Differential/Platelet -     CMP14+EGFR -     Lipid panel -     Bayer DCA Hb A1c Waived - A1C  elevated today at 8.8. Discussed with patient impact of prednisone use in late November. Freestyle Libre and daily food logs reviewed. Will continue current regimen. Encouraged continuing diabetes teaching at local pharmacy. He is current with his eye exams and has an upcoming appointment with podiatry.   Hyperlipidemia associated with type 2 diabetes mellitus ([place]) -     CBC with Differential/Platelet -     CMP14+EGFR -     Lipid panel -     Bayer DCA Hb A1c Waived - Patient managed with ASA and statin therapy. Will adjust dose if needed following lab work today.   Hypertension associated with diabetes ([place]) -     CBC with Differential/Platelet -     CMP14+EGFR -     Lipid panel -     Bayer DCA Hb A1c Waived - BP managed with olmesartan. Home BP log reviewed and revealed occasional hypotensive episodes. Patient to trial taking 20 mg every other day.   CKD stage 3 due to type 2 diabetes mellitus ([facility]) -     CBC with Differential/Platelet -     CMP14+EGFR -     Lipid panel -     Bayer DCA Hb A1c Waived -     Continue Jardiance. Creatinine has been stable, will review labs today. No decreased urinary output.   Abdominal aortic atherosclerosis ([facility]) -     Lipid panel       -    Continued ASA and statin therapy.   Thrombocytopenia ([place]) -     CBC with Differential/Platelet -     Will assess labs today   Benign Prostate Hyperplasia       -     Patient endorsing increased urinary urgency and frequency. Will trial flomax.     Continue all other maintenance medications.  Follow up plan: Return in about 3 months (around 10/09/2021), or if symptoms worsen or fail to improve, for DM.   Continue healthy lifestyle choices, including diet (rich in fruits, vegetables, and lean proteins, and low in salt and simple carbohydrates) and exercise (at least 30 minutes of moderate physical activity daily).    The above assessment and management plan was discussed with the patient. The patient  verbalized understanding of and has agreed to the management plan. Patient is aware to call the clinic if they develop any new symptoms or if symptoms persist or worsen. Patient is aware when to return to the clinic for a follow-up visit. Patient educated on when it is appropriate to go to the emergency department.   Deidre Ala, NP-S   I personally was present during the history, physical exam, and medical decision-making activities of this visit and have verified that the services and findings are accurately documented in the nurse practitioner student's note.  Monia Pouch, FNP-C [facility] [address] [place], [place] [address] [phone]

## 2021-07-13 ENCOUNTER — Telehealth: Payer: Self-pay

## 2021-07-13 LAB — LIPID PANEL
Chol/HDL Ratio: 2.8 ratio (ref 0.0–5.0)
Cholesterol, Total: 122 mg/dL (ref 100–199)
HDL: 43 mg/dL (ref >39)
LDL Chol Calc (NIH): 50 mg/dL (ref 0–99)
Triglycerides: 173 mg/dL — ABNORMAL HIGH (ref 0–149)
VLDL Cholesterol Cal: 29 mg/dL (ref 5–40)

## 2021-07-13 LAB — CBC WITH DIFFERENTIAL/PLATELET
Basophils Absolute: 0.1 10*3/uL (ref 0.0–0.2)
Basos: 1 %
EOS (ABSOLUTE): 0.3 10*3/uL (ref 0.0–0.4)
Eos: 6 %
Hematocrit: 33.8 % — ABNORMAL LOW (ref 37.5–51.0)
Hemoglobin: 11.4 g/dL — ABNORMAL LOW (ref 13.0–17.7)
Immature Grans (Abs): 0 10*3/uL (ref 0.0–0.1)
Immature Granulocytes: 0 %
Lymphocytes Absolute: 0.8 10*3/uL (ref 0.7–3.1)
Lymphs: 14 %
MCH: 31.7 pg (ref 26.6–33.0)
MCHC: 33.7 g/dL (ref 31.5–35.7)
MCV: 94 fL (ref 79–97)
Monocytes Absolute: 0.5 10*3/uL (ref 0.1–0.9)
Monocytes: 9 %
Neutrophils Absolute: 3.7 10*3/uL (ref 1.4–7.0)
Neutrophils: 70 %
Platelets: 142 10*3/uL — ABNORMAL LOW (ref 150–450)
RBC: 3.6 x10E6/uL — ABNORMAL LOW (ref 4.14–5.80)
RDW: 12.7 % (ref 11.6–15.4)
WBC: 5.4 10*3/uL (ref 3.4–10.8)

## 2021-07-13 LAB — CMP14+EGFR
ALT: 16 IU/L (ref 0–44)
AST: 17 IU/L (ref 0–40)
Albumin/Globulin Ratio: 2.4 — ABNORMAL HIGH (ref 1.2–2.2)
Albumin: 4.3 g/dL (ref 3.7–4.7)
Alkaline Phosphatase: 59 IU/L (ref 44–121)
BUN/Creatinine Ratio: 24 (ref 10–24)
BUN: 29 mg/dL — ABNORMAL HIGH (ref 8–27)
Bilirubin Total: 0.5 mg/dL (ref 0.0–1.2)
CO2: 19 mmol/L — ABNORMAL LOW (ref 20–29)
Calcium: 9 mg/dL (ref 8.6–10.2)
Chloride: 100 mmol/L (ref 96–106)
Creatinine, Ser: 1.23 mg/dL (ref 0.76–1.27)
Globulin, Total: 1.8 g/dL (ref 1.5–4.5)
Glucose: 162 mg/dL — ABNORMAL HIGH (ref 70–99)
Potassium: 4.4 mmol/L (ref 3.5–5.2)
Sodium: 134 mmol/L (ref 134–144)
Total Protein: 6.1 g/dL (ref 6.0–8.5)
eGFR: 59 mL/min/{1.73_m2} — ABNORMAL LOW (ref >59)

## 2021-07-13 NOTE — Telephone Encounter (Signed)
Hello,   Can you Please place a [id number] referral for the CCM Embedded team so I can schedule with Almyra Free. We have to have an order from PCP.  Thank you so much have a great day, Noreene Larsson, [place], [place], [place] [address] Direct Dial: [phone] [email or web address]: [email or web address]

## 2021-07-13 NOTE — Chronic Care Management (AMB) (Signed)
°  Chronic Care Management   Outreach Note  07/13/2021 Name: DAVINCI GLOTFELTY MRN: [id number] DOB: 1941/03/09  KENDAL RAFFO is a 81 y.o. year old male who is a primary care patient of Rakes, Connye Burkitt, FNP. I reached out to Hetty Blend by phone today in response to a referral sent by Mr. AXTEN PASCUCCI primary care provider.  An unsuccessful telephone outreach was attempted today. The patient was referred to the case management team for assistance with care management and care coordination.   Follow Up Plan: A HIPAA compliant phone message was left for the patient providing contact information and requesting a return call.  The care management team will reach out to the patient again over the next 7 days.  If patient returns call to provider office, please advise to call [facility]  at [place], [place], [place], [place] [address] Direct Dial: [phone] [email or web address] Website: [email or web address]

## 2021-07-13 NOTE — Telephone Encounter (Signed)
Thank you so much!  Have a great day, Noreene Larsson, [place], [place], [place] [address] Direct Dial: [phone] [email or web address]

## 2021-07-13 NOTE — Addendum Note (Signed)
Addended by: Baruch Gouty on: 07/13/2021 12:40 PM   Modules accepted: Orders

## 2021-07-21 ENCOUNTER — Other Ambulatory Visit: Payer: Self-pay | Admitting: Family Medicine

## 2021-07-21 DIAGNOSIS — E1140 Type 2 diabetes mellitus with diabetic neuropathy, unspecified: Secondary | ICD-10-CM

## 2021-07-26 NOTE — Chronic Care Management (AMB) (Signed)
Chronic Care Management   Note  07/26/2021 Name: [name] MRN: [id number] DOB: [birth year]  Joe [name] is a 81 y.o. year old male who is a primary care patient of Rakes, Connye Burkitt, FNP. I reached out to Hetty Blend by phone today in response to a referral sent by Joe [name] [facility] PCP.  Joe [name] was given information about Chronic Care Management services today including:  CCM service includes personalized support from designated clinical staff supervised by his physician, including individualized plan of care and coordination with other care providers 24/7 contact phone numbers for assistance for urgent and routine care needs. Service will only be billed when office clinical staff spend 20 minutes or more in a month to coordinate care. Only one practitioner may furnish and bill the service in a calendar month. The patient may stop CCM services at any time (effective at the end of the month) by phone call to the office staff. The patient is responsible for co-pay (up to 20% after annual deductible is met) if co-pay is required by the individual health plan.   Patient agreed to services and verbal consent obtained.   Follow up plan: Face to Face appointment with care management team member scheduled for: 08/17/2021  Noreene Larsson, [place], [place], [place] [address] Direct Dial: [phone] [email or web address] Website: [email or web address]

## 2021-08-06 DIAGNOSIS — H6123 Impacted cerumen, bilateral: Secondary | ICD-10-CM | POA: Diagnosis not present

## 2021-08-14 DIAGNOSIS — E1142 Type 2 diabetes mellitus with diabetic polyneuropathy: Secondary | ICD-10-CM | POA: Diagnosis not present

## 2021-08-14 DIAGNOSIS — B351 Tinea unguium: Secondary | ICD-10-CM | POA: Diagnosis not present

## 2021-08-14 DIAGNOSIS — L84 Corns and callosities: Secondary | ICD-10-CM | POA: Diagnosis not present

## 2021-08-14 DIAGNOSIS — M79676 Pain in unspecified toe(s): Secondary | ICD-10-CM | POA: Diagnosis not present

## 2021-08-17 ENCOUNTER — Ambulatory Visit (INDEPENDENT_AMBULATORY_CARE_PROVIDER_SITE_OTHER): Payer: Medicare PPO | Admitting: Pharmacist

## 2021-08-17 DIAGNOSIS — E1169 Type 2 diabetes mellitus with other specified complication: Secondary | ICD-10-CM

## 2021-08-17 DIAGNOSIS — E1140 Type 2 diabetes mellitus with diabetic neuropathy, unspecified: Secondary | ICD-10-CM

## 2021-08-17 MED ORDER — EMPAGLIFLOZIN 25 MG PO TABS: 25.0000 mg | ORAL_TABLET | Freq: Every morning | ORAL | 0 refills | Status: DC

## 2021-08-31 DIAGNOSIS — E1140 Type 2 diabetes mellitus with diabetic neuropathy, unspecified: Secondary | ICD-10-CM

## 2021-08-31 DIAGNOSIS — Z794 Long term (current) use of insulin: Secondary | ICD-10-CM

## 2021-08-31 DIAGNOSIS — E785 Hyperlipidemia, unspecified: Secondary | ICD-10-CM

## 2021-08-31 DIAGNOSIS — E1169 Type 2 diabetes mellitus with other specified complication: Secondary | ICD-10-CM

## 2021-09-03 ENCOUNTER — Other Ambulatory Visit: Payer: Self-pay | Admitting: Family Medicine

## 2021-09-03 DIAGNOSIS — E1140 Type 2 diabetes mellitus with diabetic neuropathy, unspecified: Secondary | ICD-10-CM

## 2021-09-05 ENCOUNTER — Ambulatory Visit: Payer: Medicare PPO | Admitting: Family Medicine

## 2021-09-05 ENCOUNTER — Encounter: Payer: Self-pay | Admitting: Family Medicine

## 2021-09-05 VITALS — BP 121/63 | HR 59 | Temp 97.8°F | Ht 69.0 in | Wt 159.5 lb

## 2021-09-05 DIAGNOSIS — B079 Viral wart, unspecified: Secondary | ICD-10-CM | POA: Diagnosis not present

## 2021-09-05 NOTE — Patient Instructions (Signed)
Cryoablation, Care After ?This sheet gives you information about how to care for yourself after your procedure. Your health care provider may also give you more specific instructions. If you have problems or questions, contact your health care provider. ?What can I expect after the procedure? ?After the procedure, it is common to have: ?Soreness around the treatment area. ?Mild pain and swelling in the treatment area. ?Follow these instructions at home: ?Treatment area care ? ?If you have an incision, follow instructions from your health care provider about how to take care of it. Make sure you: ?Wash your hands with soap and water for at least 20 seconds before and after you change your bandage (dressing). If soap and water are not available, use hand sanitizer. ?Change your dressing as told by your health care provider. ?Leave stitches (sutures), skin glue, or adhesive strips in place. These skin closures may need to stay in place for 2 weeks or longer. If adhesive strip edges start to loosen and curl up, you may trim the loose edges. Do not remove adhesive strips completely unless your health care provider tells you to do that. ?Check your treatment area every day for signs of infection. Check for: ?More redness, swelling, or pain. ?Fluid or blood. ?Warmth. ?Pus or a bad smell. ?Keep the treated area clean, dry, and covered with a dressing until it has healed. Clean the area with soap and water or as told by your health care provider. If your bandage gets wet, change it right away. ?Do not take baths, swim, or use a hot tub until your health care provider approves. Ask your health care provider if you may take showers. You may only be allowed to take sponge baths. ?Activity ? ?Follow instructions from your health care provider about any activity limitations, including lifting heavy objects. ?If you were given a sedative during the procedure, it can affect you for several hours. Do not drive or operate machinery  until your health care provider says that it is safe. ?General instructions ?Take over-the-counter and prescription medicines only as told by your health care provider. ?Do not use any products that contain nicotine or tobacco, such as cigarettes, e-cigarettes, and chewing tobacco. These can delay incision healing. If you need help quitting, ask your health care provider. ?Keep all follow-up visits as told by your health care provider. This is important. ?Contact a health care provider if: ?You have increased pain. ?You have a fever. ?You have nausea or vomiting. ?You have any of these signs of infection: ?More redness, swelling, or pain around your treatment area. ?Fluid or blood coming from your treatment area. ?Warmth coming from your treatment area. ?Pus or a bad smell coming from your treatment area. ?You do not have a bowel movement for 2 days. ?Get help right away if you have: ?Severe pain. ?Trouble swallowing or breathing. ?Severe weakness or dizziness. ?Chest pain or shortness of breath. ?These symptoms may represent a serious problem that is an emergency. Do not wait to see if the symptoms will go away. Get medical help right away. Call your local emergency services (911 in the [place] Do not drive yourself to the hospital. ?Summary ?After the procedure, it is common for the treatment area to be sore, mildly painful, and swollen. ?If you have a dressing, change it as told by your health care provider. ?Follow instructions from your health care provider about any activity limitations. ?Contact a health care provider if you have increased pain or a fever. ?This information is   not intended to replace advice given to you by your health care provider. Make sure you discuss any questions you have with your health care provider. ?Document Revised: 02/25/2019 Document Reviewed: 02/25/2019 ?Elsevier Patient Education ? 2022 [employer] ? ?

## 2021-09-05 NOTE — Progress Notes (Signed)
? ?Acute Office Visit ? ?Subjective:  ? ? [name], male    DOB: January 04, 1941, 81 y.o.   MRN: [id number] ? ?Chief Complaint  ?Patient presents with  ? Warts  ? ? ?HPI ?Here with wife. Patient is in today for a wart on his left index finger. It has been present for years. It is on the lateral aspect and often is sore. It has fallen off and grown back a few times. Denies signs of infection. He would like to have this frozen today.  ? ?Past Medical History:  ?Diagnosis Date  ? Adenomatous colon polyp   ? Arthritis   ? Cataract   ? Chest pain at rest 05/26/2016  ? Diabetes mellitus without complication ([place])   ? Dyslipidemia   ? ESOPHAGITIS, REFLUX 07/09/2004  ? Qualifier: Diagnosis of  By: Hardin Negus CMA Deborra Medina), [place]    ? Hiatal hernia   ? HOH (hard of hearing)   ? has bilateral Hearing aids  ? Hypertension   ? Irritable bowel syndrome   ? Kidney stone 1970  ? Pneumonia   ? Tick bites   ? took 5 rounds of Doxycycline this summer  ? ? ?Past Surgical History:  ?Procedure Laterality Date  ? COLONOSCOPY    ? ESOPHAGEAL DILATION  1996  ? ESOPHAGEAL DILATION  2003  ? FLEXIBLE SIGMOIDOSCOPY    ? HEMICOLECTOMY  08/2005  ? Right  ? KNEE SURGERY Right   ? TONSILLECTOMY    ? ? ?Family History  ?Problem Relation Age of Onset  ? Stroke Father 36  ? Hyperlipidemia Father   ? Hypertension Father   ? Heart disease Brother   ? Cancer Brother   ?     lung   ? Hyperlipidemia Brother   ? Hypertension Brother   ? Stroke Brother   ? Diabetes Brother   ? Breast cancer Mother   ? Colon cancer Neg Hx   ? Colon polyps Neg Hx   ? Kidney disease Neg Hx   ? Esophageal cancer Neg Hx   ? Stomach cancer Neg Hx   ? Rectal cancer Neg Hx   ? ? ?Social History  ? ?Socioeconomic History  ? Marital status: Married  ?  Spouse name: [name]  ? Number of children: 1  ? Years of education: 23  ? Highest education level: Bachelor's degree (e.g., BA, AB, BS)  ?Occupational History  ? Occupation: Retired Pharmacist, hospital  ?Tobacco Use  ? Smoking status:  Former  ?  Packs/day: 2.00  ?  Types: Cigarettes  ?  Start date: 06/03/1954  ?  Quit date: 08/24/1984  ?  Years since quitting: 37.0  ? Smokeless tobacco: Never  ? Tobacco comments:  ?  Has not used to tobacco products for the past 20 years  ?Vaping Use  ? Vaping Use: Never used  ?Substance and Sexual Activity  ? Alcohol use: Yes  ?  Alcohol/week: 7.0 standard drinks  ?  Types: 7 Standard drinks or equivalent per week  ?  Comment: Bourbon at night when he sits on the deck  ? Drug use: No  ? Sexual activity: Yes  ?  Partners: Female  ?Other Topics Concern  ? Not on file  ?Social History Narrative  ? 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 1 son  ? 3 caffeinated beverages daily  ? 1 alcoholic beverage daily  ? Former smoker no current tobacco no drug use  ? ?Social Determinants of Health  ? ?  Financial Resource Strain: Low Risk   ? Difficulty of Paying Living Expenses: Not hard at all  ?Food Insecurity: No Food Insecurity  ? Worried About Charity fundraiser in the Last Year: Never true  ? Ran Out of Food in the Last Year: Never true  ?Transportation Needs: No Transportation Needs  ? Lack of Transportation (Medical): No  ? Lack of Transportation (Non-Medical): No  ?Physical Activity: Insufficiently Active  ? Days of Exercise per Week: 3 days  ? Minutes of Exercise per Session: 20 min  ?Stress: No Stress Concern Present  ? Feeling of Stress : Not at all  ?Social Connections: Moderately Isolated  ? Frequency of Communication with Friends and Family: More than three times a week  ? Frequency of Social Gatherings with Friends and Family: Once a week  ? Attends Religious Services: Never  ? Active Member of Clubs or Organizations: No  ? Attends Archivist Meetings: Never  ? Marital Status: Married  ?Intimate Partner Violence: Not At Risk  ? Fear of Current or Ex-Partner: No  ? Emotionally Abused: No  ? Physically Abused: No  ? Sexually Abused: No  ? ? ?Outpatient Medications Prior to Visit  ?Medication  Sig Dispense Refill  ? Accu-Chek Softclix Lancets lancets CHECK BLOOD SUGAR 4 TIMES A DAY OR AS DIRECTED    ? aspirin 81 MG tablet Take 81 mg by mouth daily.    ? Blood Glucose Monitoring Suppl w/Device KIT Test BS BID and PRN dx E11.9. Give test strips and lancets to match machine given 1 each 1  ? cholecalciferol (VITAMIN D) 1000 UNITS tablet Take 2,000 Units by mouth daily.    ? Cinnamon 500 MG TABS Take 1,000 mg by mouth daily.    ? Continuous Blood Gluc Receiver (FREESTYLE LIBRE 2 READER) DEVI Use to test blood sugars up to 6 times daily as directed. DX: E11.9 1 each 0  ? Continuous Blood Gluc Sensor (FREESTYLE LIBRE 2 SENSOR) MISC USE 1 SENSOR TO CHECK BLOOD SUGAR UP TO SIX TIMES DAILY AS DIRECTED 2 each 11  ? diclofenac Sodium (VOLTAREN) 1 % GEL Apply 2 g topically 4 (four) times daily. 100 g 0  ? diphenhydrAMINE (BENADRYL) 25 MG tablet Take 25 mg by mouth every 6 (six) hours as needed.    ? doxylamine, Sleep, (UNISOM) 25 MG tablet Take 25 mg by mouth at bedtime as needed for sleep.     ? empagliflozin (JARDIANCE) 25 MG TABS tablet Take 1 tablet (25 mg total) by mouth in the morning. 90 tablet 0  ? esomeprazole (NEXIUM) 40 MG capsule TAKE (1) CAPSULE DAILY 30 capsule 5  ? glucose blood (ONETOUCH ULTRA) test strip Check blood sugar 4 times daily Dx E 11.40 200 strip 11  ? Insulin Pen Needle (PEN NEEDLES) 32G X 5 MM MISC 1 Device by Does not apply route daily. 100 each 3  ? JANUVIA 100 MG tablet TAKE 1 TABLET DAILY 90 tablet 0  ? MELATONIN PO Take by mouth.    ? metFORMIN (GLUCOPHAGE-XR) 500 MG 24 hr tablet TAKE 2 TABLETS 2 TIMES A DAY WITH MEALS 360 tablet 0  ? mupirocin ointment (BACTROBAN) 2 % Apply 1 application topically 2 (two) times daily. 22 g 1  ? olmesartan (BENICAR) 20 MG tablet TAKE 1 TABLET ONCE DAILY 90 tablet 0  ? Probiotic Product (PROBIOTIC PO) Take 1 tablet by mouth daily.    ? simvastatin (ZOCOR) 40 MG tablet TAKE 1 TABLET ONCE DAILY IN  THE EVENING 90 tablet 0  ? tamsulosin (FLOMAX) 0.4 MG  CAPS capsule Take 1 capsule (0.4 mg total) by mouth at bedtime. 30 capsule 3  ? TRESIBA FLEXTOUCH 100 UNIT/ML FlexTouch Pen Inject 0.06-0.2 mLs (6-20 Units total) into the skin daily at 10 pm. 15 mL 2  ? triamcinolone ointment (KENALOG) 0.5 % APPLY TO AFFECTED AREAS TWICE A DAY 30 g 2  ? ?No facility-administered medications prior to visit.  ? ? ?Allergies  ?Allergen Reactions  ? Ace Inhibitors Cough  ? Trazamine [Trazodone & Diet Manage Prod] Other (See Comments)  ?  nightmares  ? ? ?Review of Systems ?As per HPI.  ?   ?Objective:  ?  ?Physical Exam ?Vitals and nursing note reviewed.  ?Constitutional:   ?   General: He is not in acute distress. ?   Appearance: Normal appearance. He is not ill-appearing, toxic-appearing or diaphoretic.  ?HENT:  ?   Nose: Nose normal.  ?Pulmonary:  ?   Effort: Pulmonary effort is normal. No respiratory distress.  ?Skin: ?   General: Skin is warm and dry.  ?   Comments: Small wart present to left index finger on the lateral distal aspect.   ?Neurological:  ?   General: No focal deficit present.  ?   Mental Status: He is alert and oriented to person, place, and time.  ?   Gait: Gait normal.  ?Psychiatric:     ?   Mood and Affect: Mood normal.     ?   Behavior: Behavior normal.     ?   Thought Content: Thought content normal.     ?   Judgment: Judgment normal.  ? ?Skin excision ? ?Date/Time: 09/05/2021 10:41 AM ?Performed by: Gwenlyn Perking, FNP ?Authorized by: Gwenlyn Perking, FNP  ? ?Number of Lesions: 1 ?Lesion 1:  ?  Body area: upper extremity ?  Upper extremity location: L hand ?  Initial size (mm): 3 ?  Final defect size (mm): 4 ?  Malignancy: benign lesion   ?  Destruction method: cryotherapy   ?  Closure complexity: simple   ?  Closure complexity comment: simple bandage ? ? ?BP 121/63   Pulse (!) 59   Temp 97.8 ?F (36.6 ?C) (Temporal)   Ht 5' 9"  (1.753 m)   Wt 159 lb 8 oz (72.3 kg)   BMI 23.55 kg/m?  ?Wt Readings from Last 3 Encounters:  ?09/05/21 159 lb 8 oz (72.3 kg)   ?07/12/21 165 lb 2 oz (74.9 kg)  ?04/23/21 163 lb (73.9 kg)  ? ? ?Health Maintenance Due  ?Topic Date Due  ? Zoster Vaccines- Shingrix (1 of 2) Never done  ? OPHTHALMOLOGY EXAM  08/29/2021  ? ? ?There are no p

## 2021-09-11 ENCOUNTER — Ambulatory Visit: Payer: Medicare PPO | Admitting: Family Medicine

## 2021-09-11 ENCOUNTER — Encounter: Payer: Self-pay | Admitting: Family Medicine

## 2021-09-11 VITALS — BP 113/61 | HR 68 | Temp 97.9°F | Ht 69.0 in | Wt 162.8 lb

## 2021-09-11 DIAGNOSIS — B029 Zoster without complications: Secondary | ICD-10-CM

## 2021-09-11 NOTE — Progress Notes (Signed)
?  ? ?Subjective:  ?[name], male    DOB: March 08, 1941, 81 y.o.   MRN: [id number] ? ?Patient Care Team: ?Baruch Gouty, FNP as PCP - General (Family Medicine) ?Irine Seal, MD (Urology) ?Gatha Mayer, MD (Gastroenterology) ?Minus Breeding, MD as Consulting Physician (Cardiology) ?Lavera Guise, [facility] (Pharmacist) ?Harlen Labs, MD as Referring Physician (Optometry)  ? ?Chief Complaint:  Rash (Abd - x 3 days - does not itch or hurt ) ? ? ?HPI: ?Joe [name] is a 81 y.o. male presenting on 09/11/2021 for Rash (Abd - x 3 days - does not itch or hurt ) ? ? ?Pt presents today for evaluation of rash to right lower abdomen / flank, onset Saturday. Slightly painful and pruritic at beginning, greatly improved over last 2 days.  ? ?Rash ?This is a new problem. The current episode started in the past 7 days. The problem has been gradually improving since onset. Location: right lower abdomen / flank. The rash is characterized by redness, itchiness and blistering. He was exposed to nothing. Pertinent negatives include no anorexia, congestion, cough, diarrhea, eye pain, facial edema, fatigue, fever, joint pain, nail changes, rhinorrhea, shortness of breath, sore throat or vomiting.  ? ? ? ?Relevant past medical, surgical, family, and social history reviewed and updated as indicated.  ?Allergies and medications reviewed and updated. Data reviewed: Chart in Epic. ? ? ?Past Medical History:  ?Diagnosis Date  ? Adenomatous colon polyp   ? Arthritis   ? Cataract   ? Chest pain at rest 05/26/2016  ? Diabetes mellitus without complication ([place])   ? Dyslipidemia   ? ESOPHAGITIS, REFLUX 07/09/2004  ? Qualifier: Diagnosis of  By: Hardin Negus CMA Deborra Medina), [place]    ? Hiatal hernia   ? HOH (hard of hearing)   ? has bilateral Hearing aids  ? Hypertension   ? Irritable bowel syndrome   ? Kidney stone 1970  ? Pneumonia   ? Tick bites   ? took 5 rounds of Doxycycline this summer  ? ? ?Past Surgical History:  ?Procedure  Laterality Date  ? COLONOSCOPY    ? ESOPHAGEAL DILATION  1996  ? ESOPHAGEAL DILATION  2003  ? FLEXIBLE SIGMOIDOSCOPY    ? HEMICOLECTOMY  08/2005  ? Right  ? KNEE SURGERY Right   ? TONSILLECTOMY    ? ? ?Social History  ? ?Socioeconomic History  ? Marital status: Married  ?  Spouse name: [name]  ? Number of children: 1  ? Years of education: 72  ? Highest education level: Bachelor's degree (e.g., BA, AB, BS)  ?Occupational History  ? Occupation: Retired Pharmacist, hospital  ?Tobacco Use  ? Smoking status: Former  ?  Packs/day: 2.00  ?  Types: Cigarettes  ?  Start date: 06/03/1954  ?  Quit date: 08/24/1984  ?  Years since quitting: 37.0  ? Smokeless tobacco: Never  ? Tobacco comments:  ?  Has not used to tobacco products for the past 20 years  ?Vaping Use  ? Vaping Use: Never used  ?Substance and Sexual Activity  ? Alcohol use: Yes  ?  Alcohol/week: 7.0 standard drinks  ?  Types: 7 Standard drinks or equivalent per week  ?  Comment: Bourbon at night when he sits on the deck  ? Drug use: No  ? Sexual activity: Yes  ?  Partners: Female  ?Other Topics Concern  ? Not on file  ?Social History Narrative  ? 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 1 son  ? 3 caffeinated beverages daily  ? 1 alcoholic beverage daily  ? Former smoker no current tobacco no drug use  ? ?Social Determinants of Health  ? ?Financial Resource Strain: Low Risk   ? Difficulty of Paying Living Expenses: Not hard at all  ?Food Insecurity: No Food Insecurity  ? Worried About Charity fundraiser in the Last Year: Never true  ? Ran Out of Food in the Last Year: Never true  ?Transportation Needs: No Transportation Needs  ? Lack of Transportation (Medical): No  ? Lack of Transportation (Non-Medical): No  ?Physical Activity: Insufficiently Active  ? Days of Exercise per Week: 3 days  ? Minutes of Exercise per Session: 20 min  ?Stress: No Stress Concern Present  ? Feeling of Stress : Not at all  ?Social Connections: Moderately Isolated  ? Frequency of  Communication with Friends and Family: More than three times a week  ? Frequency of Social Gatherings with Friends and Family: Once a week  ? Attends Religious Services: Never  ? Active Member of Clubs or Organizations: No  ? Attends Archivist Meetings: Never  ? Marital Status: Married  ?Intimate Partner Violence: Not At Risk  ? Fear of Current or Ex-Partner: No  ? Emotionally Abused: No  ? Physically Abused: No  ? Sexually Abused: No  ? ? ?Outpatient Encounter Medications as of 09/11/2021  ?Medication Sig  ? Accu-Chek Softclix Lancets lancets CHECK BLOOD SUGAR 4 TIMES A DAY OR AS DIRECTED  ? aspirin 81 MG tablet Take 81 mg by mouth daily.  ? Blood Glucose Monitoring Suppl w/Device KIT Test BS BID and PRN dx E11.9. Give test strips and lancets to match machine given  ? cholecalciferol (VITAMIN D) 1000 UNITS tablet Take 2,000 Units by mouth daily.  ? Cinnamon 500 MG TABS Take 1,000 mg by mouth daily.  ? Continuous Blood Gluc Receiver (FREESTYLE LIBRE 2 READER) DEVI Use to test blood sugars up to 6 times daily as directed. DX: E11.9  ? Continuous Blood Gluc Sensor (FREESTYLE LIBRE 2 SENSOR) MISC USE 1 SENSOR TO CHECK BLOOD SUGAR UP TO SIX TIMES DAILY AS DIRECTED  ? diclofenac Sodium (VOLTAREN) 1 % GEL Apply 2 g topically 4 (four) times daily.  ? diphenhydrAMINE (BENADRYL) 25 MG tablet Take 25 mg by mouth every 6 (six) hours as needed.  ? doxylamine, Sleep, (UNISOM) 25 MG tablet Take 25 mg by mouth at bedtime as needed for sleep.   ? empagliflozin (JARDIANCE) 25 MG TABS tablet Take 1 tablet (25 mg total) by mouth in the morning.  ? esomeprazole (NEXIUM) 40 MG capsule TAKE (1) CAPSULE DAILY  ? glucose blood (ONETOUCH ULTRA) test strip Check blood sugar 4 times daily Dx E 11.40  ? Insulin Pen Needle (PEN NEEDLES) 32G X 5 MM MISC 1 Device by Does not apply route daily.  ? JANUVIA 100 MG tablet TAKE 1 TABLET DAILY  ? MELATONIN PO Take by mouth.  ? metFORMIN (GLUCOPHAGE-XR) 500 MG 24 hr tablet TAKE 2 TABLETS 2  TIMES A DAY WITH MEALS  ? mupirocin ointment (BACTROBAN) 2 % Apply 1 application topically 2 (two) times daily.  ? olmesartan (BENICAR) 20 MG tablet TAKE 1 TABLET ONCE DAILY  ? Probiotic Product (PROBIOTIC PO) Take 1 tablet by mouth daily.  ? simvastatin (ZOCOR) 40 MG tablet TAKE 1 TABLET ONCE DAILY IN THE EVENING  ? tamsulosin (FLOMAX) 0.4 MG CAPS capsule Take 1 capsule (0.4 mg  total) by mouth at bedtime.  ? TRESIBA FLEXTOUCH 100 UNIT/ML FlexTouch Pen Inject 0.06-0.2 mLs (6-20 Units total) into the skin daily at 10 pm.  ? triamcinolone ointment (KENALOG) 0.5 % APPLY TO AFFECTED AREAS TWICE A DAY  ? ?No facility-administered encounter medications on file as of 09/11/2021.  ? ? ?Allergies  ?Allergen Reactions  ? Ace Inhibitors Cough  ? Trazamine [Trazodone & Diet Manage Prod] Other (See Comments)  ?  nightmares  ? ? ?Review of Systems  ?Constitutional:  Negative for activity change, appetite change, chills, diaphoresis, fatigue, fever and unexpected weight change.  ?HENT: Negative.  Negative for congestion, rhinorrhea and sore throat.   ?Eyes: Negative.  Negative for photophobia, pain and visual disturbance.  ?Respiratory:  Negative for cough, chest tightness and shortness of breath.   ?Cardiovascular:  Negative for chest pain, palpitations and leg swelling.  ?Gastrointestinal:  Negative for abdominal pain, anorexia, blood in stool, constipation, diarrhea, nausea and vomiting.  ?Endocrine: Negative.   ?Genitourinary:  Negative for decreased urine volume, difficulty urinating, dysuria, enuresis, flank pain, frequency, genital sores, hematuria, penile discharge, penile pain, penile swelling, scrotal swelling, testicular pain and urgency.  ?Musculoskeletal:  Negative for arthralgias, joint pain and myalgias.  ?Skin:  Positive for color change and rash. Negative for nail changes, pallor and wound.  ?Allergic/Immunologic: Negative.   ?Neurological:  Negative for dizziness, tremors, seizures, syncope, facial asymmetry,  speech difficulty, weakness, light-headedness, numbness and headaches.  ?Hematological: Negative.   ?Psychiatric/Behavioral:  Negative for behavioral problems, confusion, hallucinations, sleep disturbance and suicidal id

## 2021-09-13 ENCOUNTER — Other Ambulatory Visit: Payer: Self-pay | Admitting: Family Medicine

## 2021-09-13 DIAGNOSIS — K219 Gastro-esophageal reflux disease without esophagitis: Secondary | ICD-10-CM

## 2021-09-14 NOTE — Progress Notes (Signed)
? ? ?Chronic Care Management ?Pharmacy Note ? ?08/17/2021 ?Name:  Joe [name] MRN:  [id number] DOB:  [birth year] ? ?Summary: ? ?Diabetes: New goal. ?Uncontrolled;  ?Current treatment: TRESIBA 20 units, JANUVIA 142m, METFORMIN, JARDIANCE 211m  ?A1c increased to 8.8% (recent steroids within the past 3 months for back pain) ?Patient does not wish to try a GLP1 (he does not want additional weight loss) ?Would like to try low dose trulicity at some point (in place of ja[place]ongaWould consider meal time insulin if numbers don't correct or patient needs additional steroids ?Current glucose readings: fasting glucose: usually <130, post prandial glucose: most within range, but can get in the 200s depending on diet; has been on prednisone for back pain ?Current meal patterns: patient/wife track food diary; he is eating a balanced diet for the most part (indulges in treats every now & then) ?Current exercise: OCCASIONAL  ?Recommended adding extra tablet of metformin with lunch to aid in BG lowering; patient does not wish to add more insulin at this time ? ?Subjective: ?Joe RETHs an [age].o. year old male who is a primary patient of Rakes, LiConnye BurkittFNP.  The CCM team was consulted for assistance with disease management and care coordination needs.   ? ?Engaged with patient face to face for follow up visit in response to provider referral for pharmacy case management and/or care coordination services.  ? ?Consent to Services:  ?The patient was given information about Chronic Care Management services, agreed to services, and gave verbal consent prior to initiation of services.  Please see initial visit note for detailed documentation.  ? ?Patient Care Team: ?RaBaruch GoutyFNP as PCP - General (Family Medicine) ?WrIrine SealMD (Urology) ?GeGatha MayerMD (Gastroenterology) ?HoMinus BreedingMD as Consulting Physician (Cardiology) ?PrLavera GuiseRPSanctuary At [facility] Woodlands, ThePharmacist) ?LeHarlen LabsMD as Referring Physician  (Optometry) ?PrLavera GuiseRPHouston [facility] Alexander Campuss Pharmacist (Family Medicine) ? ?Objective: ? ?Lab Results  ?Component Value Date  ? CREATININE 1.23 07/12/2021  ? CREATININE 1.31 (H) 01/05/2021  ? CREATININE 1.22 09/26/2020  ? ? ?Lab Results  ?Component Value Date  ? HGBA1C 8.8 (H) 07/12/2021  ? ?Last diabetic Eye exam:  ?Lab Results  ?Component Value Date/Time  ? HMDIABEYEEXA No Retinopathy 08/03/2018 12:00 AM  ?  ?Last diabetic Foot exam: No results found for: HMDIABFOOTEX  ? ?   ?Component Value Date/Time  ? CHOL 122 07/12/2021 1011  ? CHOL 105 12/24/2012 0850  ? TRIG 173 (H) 07/12/2021 1011  ? TRIG 207 (H) 10/29/2016 1100  ? TRIG 46 12/24/2012 0850  ? HDL 43 07/12/2021 1011  ? HDL 34 (L) 10/29/2016 1100  ? HDL 44 12/24/2012 0850  ? CHOLHDL 2.8 07/12/2021 1011  ? CHOLHDL 2.8 05/26/2016 0349  ? VLDL 20 05/26/2016 0349  ? LDLCALC 50 07/12/2021 1011  ? LDKingsland9 02/03/2014 0840  ? LDBlacklake2 12/24/2012 0850  ? ? ? ?  Latest Ref Rng & Units 07/12/2021  ? 10:11 AM 01/05/2021  ?  1:37 PM 09/26/2020  ? 11:07 AM  ?Hepatic Function  ?Total Protein 6.0 - 8.5 g/dL 6.1   6.1     ?Albumin 3.7 - 4.7 g/dL 4.3   4.0   4.5    ?AST 0 - 40 IU/L 17   15     ?ALT 0 - 44 IU/L 16   12     ?Alk Phosphatase 44 - 121 IU/L 59   56     ?Total  Bilirubin 0.0 - 1.2 mg/dL 0.5   0.3     ? ? ?Lab Results  ?Component Value Date/Time  ? TSH 0.918 03/07/2017 12:12 PM  ? ? ? ?  Latest Ref Rng & Units 07/12/2021  ? 10:11 AM 04/10/2021  ? 12:11 PM 01/05/2021  ?  1:37 PM  ?CBC  ?WBC 3.4 - 10.8 x10E3/uL 5.4   6.0   5.1    ?Hemoglobin 13.0 - 17.7 g/dL 11.4   11.7   11.7    ?Hematocrit 37.5 - 51.0 % 33.8   34.5   34.3    ?Platelets 150 - 450 x10E3/uL 142   126   122    ? ? ?Lab Results  ?Component Value Date/Time  ? VD25OH 54.3 10/02/2018 10:19 AM  ? VD25OH 46.8 04/01/2018 11:11 AM  ? ? ?Clinical ASCVD: No  ?The ASCVD Risk score (Arnett DK, et al., 2019) failed to calculate for the following reasons: ?  The 2019 ASCVD risk score is only valid for ages 15 to 63   ? ?Other:  (CHADS2VASc if Afib, PHQ9 if depression, MMRC or CAT for COPD, ACT, DEXA) ? ?Social History  ? ?Tobacco Use  ?Smoking Status Former  ? Packs/day: 2.00  ? Types: Cigarettes  ? Start date: 06/03/1954  ? Quit date: 08/24/1984  ? Years since quitting: 37.0  ?Smokeless Tobacco Never  ?Tobacco Comments  ? Has not used to tobacco products for the past 20 years  ? ?BP Readings from Last 3 Encounters:  ?09/11/21 113/61  ?09/05/21 121/63  ?07/12/21 127/66  ? ?Pulse Readings from Last 3 Encounters:  ?09/11/21 68  ?09/05/21 (!) 59  ?07/12/21 62  ? ?Wt Readings from Last 3 Encounters:  ?09/11/21 162 lb 12.8 oz (73.8 kg)  ?09/05/21 159 lb 8 oz (72.3 kg)  ?07/12/21 165 lb 2 oz (74.9 kg)  ? ? ?Assessment: Review of patient past medical history, allergies, medications, health status, including review of consultants reports, laboratory and other test data, was performed as part of comprehensive evaluation and provision of chronic care management services.  ? ?SDOH:  (Social Determinants of Health) assessments and interventions performed:  ? ? ?CCM Care Plan ? ?Allergies  ?Allergen Reactions  ? Ace Inhibitors Cough  ? Trazamine [Trazodone & Diet Manage Prod] Other (See Comments)  ?  nightmares  ? ? ?Medications Reviewed Today   ? ? Reviewed by Lavera Guise, [facility] (Pharmacist) on 09/14/21 at 1354  Med List Status: <None>  ? ?Medication Order Taking? Sig Documenting Provider Last Dose Status Informant  ?Accu-Chek Softclix Lancets lancets [id number] No CHECK BLOOD SUGAR 4 TIMES A DAY OR AS DIRECTED [provider] Taking Active   ?aspirin 81 MG tablet [id number] No Take 81 mg by mouth daily. [provider] Taking Active Spouse/Significant Other  ?Blood Glucose Monitoring Suppl w/Device KIT [id number] No Test BS BID and PRN dx E11.9. Give test strips and lancets to match machine given Chipper Herb, MD Taking Active   ?cholecalciferol (VITAMIN D) 1000 UNITS tablet [id number] No Take 2,000 Units by mouth daily. [provider] Taking Active Spouse/Significant Other  ?Cinnamon 500 MG TABS [id number] No Take 1,000 mg by mouth daily. [provider] Taking Active Spouse/Significant Other  ?Continuous Blood Gluc Receiver (FREESTYLE LIBRE 2 READER) DEVI [id number] No Use to test blood sugars up to 6 times daily as directed. DX: E11.9 Janora Norlander, DO Taking Active   ?Continuous Blood Gluc Sensor (FREESTYLE LIBRE 2 SENSOR) MISC [id number] No  USE 1 SENSOR TO CHECK BLOOD SUGAR UP TO SIX TIMES DAILY AS DIRECTED Ronnie Doss M, DO Taking Active   ?diclofenac Sodium (VOLTAREN) 1 % GEL [id number] No Apply 2 g topically 4 (four) times daily. Ivy Lynn, NP Taking Active   ?diphenhydrAMINE (BENADRYL) 25 MG tablet [id number] No Take 25 mg by mouth every 6 (six) hours as needed. [provider] Taking Active   ?doxylamine, Sleep, (UNISOM) 25 MG tablet [id number] No Take 25 mg by mouth at bedtime as needed for sleep.  [provider] Taking Active Spouse/Significant Other  ?empagliflozin (JARDIANCE) 25 MG TABS tablet [id number] No Take 1 tablet (25 mg total) by mouth in the morning. Baruch Gouty, FNP Taking Active   ?esomeprazole (NEXIUM) 40 MG capsule [id number]  TAKE (1) CAPSULE DAILY Ronnie Doss M, DO  Active   ?glucose blood (ONETOUCH ULTRA) test strip [id number] No Check blood sugar 4 times daily Dx E 11.40 Rakes, Connye Burkitt, FNP Taking Active   ?Insulin Pen Needle (PEN NEEDLES) 32G X 5 MM MISC [id number] No 1 Device by Does not apply route daily. Janora Norlander, DO Taking Active   ?JANUVIA 100 MG tablet [id number] No TAKE 1 TABLET DAILY Rakes, Connye Burkitt, FNP Taking Active   ?MELATONIN PO [id number] No Take by mouth. [provider] Taking Active   ?metFORMIN (GLUCOPHAGE-XR) 500 MG 24 hr tablet [id number] No TAKE 2 TABLETS 2 TIMES A DAY WITH MEALS Rakes, Connye Burkitt, FNP Taking Active   ?mupirocin ointment (BACTROBAN) 2 % [id number] No Apply 1 application topically 2 (two) times daily.  Timmothy Euler, MD Taking Active   ?olmesartan (BENICAR) 20 MG tablet [id number] No TAKE 1 TABLET ONCE DAILY Rakes, Connye Burkitt, FNP Taking Active   ?Probiotic Product (PROBIOTIC PO) [id number] No Take 1 tablet by mouth daily. P

## 2021-09-14 NOTE — Patient Instructions (Signed)
Visit Information ? ?Following are the goals we discussed today:  ?Current Barriers:  ?Unable to maintain control of T2DM ? ?Pharmacist Clinical Goal(s):  ?patient will maintain control of T2DM as evidenced by GOAL A1C<7% AND >70% TIME IN TARGET RANGE  through collaboration with PharmD and provider.  ? ? ?Interventions: ?1:1 collaboration with Rakes, Connye Burkitt, FNP regarding development and update of comprehensive plan of care as evidenced by provider attestation and co-signature ?Inter-disciplinary care team collaboration (see longitudinal plan of care) ?Comprehensive medication review performed; medication list updated in electronic medical record ? ?Diabetes: New goal. ?Uncontrolled;  ?Current treatment: TRESIBA 20 units, JANUVIA '100mg'$ , METFORMIN, JARDIANCE '25mg'$ ;  ?A1c increased to 8.8% (recent steroids within the past 3 months for back pain) ?Patient does not wish to try a GLP1 (he does not want additional weight loss) ?Would like to try low dose trulicity at some point (in place of [place] ?Would consider meal time insulin if numbers don't correct or patient needs additional steroids ?Current glucose readings: fasting glucose: usually <130, post prandial glucose: most within range, but can get in the 200s depending on diet; has been on prednisone for back pain ?Current meal patterns: patient/wife track food diary; he is eating a balanced diet for the most part (indulges in treats every now & then) ?Current exercise: OCCASIONAL  ?Recommended adding extra tablet of metformin with lunch to aid in BG lowering; patient does not wish to add more insulin at this time ? ? ?Patient Goals/Self-Care Activities ?patient will:  ?- take medications as prescribed as evidenced by patient report and record review ?check glucose USING LIBRE 2 CGM, document, and provide at future appointments ?target a minimum of 150 minutes of moderate intensity exercise weekly ?engage in dietary modifications by FOLLOWING A HEART HEALTHY  DIET/HEALTHY PLATE METHOD ? ? ? ?Plan: The patient has been provided with contact information for the care management team and has been advised to call with any health related questions or concerns.  ? ?Signature ?Regina Eck, PharmD, BCPS ?Clinical Pharmacist, [facility] ?[place]  [place] Phone [phone] ? ? ?Please call the care guide team at [phone] if you need to cancel or reschedule your appointment.  ? ?Patient verbalizes understanding of instructions and care plan provided today and agrees to view in [place]. Active MyChart status confirmed with patient.   ? ?

## 2021-10-01 ENCOUNTER — Other Ambulatory Visit: Payer: Self-pay | Admitting: Family Medicine

## 2021-10-02 ENCOUNTER — Other Ambulatory Visit: Payer: Self-pay | Admitting: Family Medicine

## 2021-10-03 NOTE — Telephone Encounter (Signed)
Pt went at lunch yesterday & [facility] said they did not have refill that was sent by [place] on 10/01/21. ?Sent again, went ahead and done for a years worth of refills ?

## 2021-10-06 ENCOUNTER — Other Ambulatory Visit: Payer: Self-pay | Admitting: Family Medicine

## 2021-10-06 DIAGNOSIS — E1169 Type 2 diabetes mellitus with other specified complication: Secondary | ICD-10-CM

## 2021-10-06 DIAGNOSIS — I700 Atherosclerosis of aorta: Secondary | ICD-10-CM

## 2021-10-09 ENCOUNTER — Encounter: Payer: Self-pay | Admitting: Family Medicine

## 2021-10-09 ENCOUNTER — Ambulatory Visit: Payer: Medicare PPO | Admitting: Family Medicine

## 2021-10-09 VITALS — BP 118/61 | HR 55 | Temp 97.9°F | Ht 69.0 in | Wt 163.0 lb

## 2021-10-09 DIAGNOSIS — W57XXXA Bitten or stung by nonvenomous insect and other nonvenomous arthropods, initial encounter: Secondary | ICD-10-CM

## 2021-10-09 DIAGNOSIS — E1140 Type 2 diabetes mellitus with diabetic neuropathy, unspecified: Secondary | ICD-10-CM

## 2021-10-09 DIAGNOSIS — R2681 Unsteadiness on feet: Secondary | ICD-10-CM | POA: Diagnosis not present

## 2021-10-09 DIAGNOSIS — N183 Chronic kidney disease, stage 3 unspecified: Secondary | ICD-10-CM

## 2021-10-09 DIAGNOSIS — I152 Hypertension secondary to endocrine disorders: Secondary | ICD-10-CM | POA: Diagnosis not present

## 2021-10-09 DIAGNOSIS — S70361A Insect bite (nonvenomous), right thigh, initial encounter: Secondary | ICD-10-CM | POA: Diagnosis not present

## 2021-10-09 DIAGNOSIS — E1122 Type 2 diabetes mellitus with diabetic chronic kidney disease: Secondary | ICD-10-CM

## 2021-10-09 DIAGNOSIS — E1159 Type 2 diabetes mellitus with other circulatory complications: Secondary | ICD-10-CM

## 2021-10-09 DIAGNOSIS — Z794 Long term (current) use of insulin: Secondary | ICD-10-CM

## 2021-10-09 LAB — BAYER DCA HB A1C WAIVED: HB A1C (BAYER DCA - WAIVED): 8.5 % — ABNORMAL HIGH (ref 4.8–5.6)

## 2021-10-09 MED ORDER — DOXYCYCLINE HYCLATE 100 MG PO TABS: 100.0000 mg | ORAL_TABLET | Freq: Two times a day (BID) | ORAL | 0 refills | Status: AC

## 2021-10-09 NOTE — Progress Notes (Signed)
?  ? ?Subjective:  ?[name], male    DOB: [birth year], 81 y.o.   MRN: [id number] ? ?Patient Care Team: ?Baruch Gouty, FNP as PCP - General (Family Medicine) ?Irine Seal, MD (Urology) ?Gatha Mayer, MD (Gastroenterology) ?Minus Breeding, MD as Consulting Physician (Cardiology) ?Lavera Guise, [facility] (Pharmacist) ?Harlen Labs, MD as Referring Physician (Optometry) ?Lavera Guise, [facility] as Pharmacist (Family Medicine)  ? ?Chief Complaint:  Medical Management of Chronic Issues ? ? ?HPI: ?Joe [name] is a 81 y.o. male presenting on 10/09/2021 for Medical Management of Chronic Issues ? ? ?1. Type 2 diabetes mellitus with diabetic neuropathy, with long-term current use of insulin ([place]) ?BS ranging from 90-250 at home, low to mid 100 range for the most part. Denies polyuria, polyphagia, or polydipsia. No neuropathy. Complaint with medications without associated side effects.  ? ?2. Hypertension associated with diabetes ([place]) ?Well controlled at home and in office. No headaches, chest pain, leg swelling, palpitations, dizziness, or syncope. Compliant with medications without associated side effects.  ? ?3. CKD stage 3 due to type 2 diabetes mellitus ([place]) ?No changes in urinary output. No swelling, weakness, or confusion.  ? ?4. Tick bite of right thigh, initial encounter ?Removed two ticks from right upper leg 2 days ago. Does have red rash around site.  ? ?5. Gait instability ?Ongoing for several months and worsening. He has a forward leaning gait with some instability. No falls or other associated symptoms.   ? ? ?Relevant past medical, surgical, family, and social history reviewed and updated as indicated.  ?Allergies and medications reviewed and updated. Data reviewed: Chart in Epic. ? ? ?Past Medical History:  ?Diagnosis Date  ? Adenomatous colon polyp   ? Arthritis   ? Cataract   ? Chest pain at rest 05/26/2016  ? Diabetes mellitus without complication ([place])   ? Dyslipidemia   ?  ESOPHAGITIS, REFLUX 07/09/2004  ? Qualifier: Diagnosis of  By: Hardin Negus CMA Deborra Medina), [place]    ? Hiatal hernia   ? HOH (hard of hearing)   ? has bilateral Hearing aids  ? Hypertension   ? Irritable bowel syndrome   ? Kidney stone 1970  ? Pneumonia   ? Tick bites   ? took 5 rounds of Doxycycline this summer  ? ? ?Past Surgical History:  ?Procedure Laterality Date  ? COLONOSCOPY    ? ESOPHAGEAL DILATION  1996  ? ESOPHAGEAL DILATION  2003  ? FLEXIBLE SIGMOIDOSCOPY    ? HEMICOLECTOMY  08/2005  ? Right  ? KNEE SURGERY Right   ? TONSILLECTOMY    ? ? ?Social History  ? ?Socioeconomic History  ? Marital status: Married  ?  Spouse name: [name]  ? Number of children: 1  ? Years of education: 42  ? Highest education level: Bachelor's degree (e.g., BA, AB, BS)  ?Occupational History  ? Occupation: Retired Pharmacist, hospital  ?Tobacco Use  ? Smoking status: Former  ?  Packs/day: 2.00  ?  Types: Cigarettes  ?  Start date: 06/03/1954  ?  Quit date: 08/24/1984  ?  Years since quitting: 37.1  ? Smokeless tobacco: Never  ? Tobacco comments:  ?  Has not used to tobacco products for the past 20 years  ?Vaping Use  ? Vaping Use: Never used  ?Substance and Sexual Activity  ? Alcohol use: Yes  ?  Alcohol/week: 7.0 standard drinks  ?  Types: 7 Standard drinks or equivalent per week  ?  Comment: [place] at night when he sits on the deck  ? Drug use: No  ? Sexual activity: Yes  ?  Partners: Female  ?Other Topics Concern  ? Not on file  ?Social History Narrative  ? 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 1 son  ? 3 caffeinated beverages daily  ? 1 alcoholic beverage daily  ? Former smoker no current tobacco no drug use  ? ?Social Determinants of Health  ? ?Financial Resource Strain: Low Risk   ? Difficulty of Paying Living Expenses: Not hard at all  ?Food Insecurity: No Food Insecurity  ? Worried About Charity fundraiser in the Last Year: Never true  ? Ran Out of Food in the Last Year: Never true  ?Transportation Needs: No Transportation  Needs  ? Lack of Transportation (Medical): No  ? Lack of Transportation (Non-Medical): No  ?Physical Activity: Insufficiently Active  ? Days of Exercise per Week: 3 days  ? Minutes of Exercise per Session: 20 min  ?Stress: No Stress Concern Present  ? Feeling of Stress : Not at all  ?Social Connections: Moderately Isolated  ? Frequency of Communication with Friends and Family: More than three times a week  ? Frequency of Social Gatherings with Friends and Family: Once a week  ? Attends Religious Services: Never  ? Active Member of Clubs or Organizations: No  ? Attends Archivist Meetings: Never  ? Marital Status: Married  ?Intimate Partner Violence: Not At Risk  ? Fear of Current or Ex-Partner: No  ? Emotionally Abused: No  ? Physically Abused: No  ? Sexually Abused: No  ? ? ?Outpatient Encounter Medications as of 10/09/2021  ?Medication Sig  ? Accu-Chek Softclix Lancets lancets CHECK BLOOD SUGAR 4 TIMES A DAY OR AS DIRECTED  ? aspirin 81 MG tablet Take 81 mg by mouth daily.  ? Blood Glucose Monitoring Suppl w/Device KIT Test BS BID and PRN dx E11.9. Give test strips and lancets to match machine given  ? cholecalciferol (VITAMIN D) 1000 UNITS tablet Take 2,000 Units by mouth daily.  ? Cinnamon 500 MG TABS Take 1,000 mg by mouth daily.  ? Continuous Blood Gluc Receiver (FREESTYLE LIBRE 2 READER) DEVI Use to test blood sugars up to 6 times daily as directed. DX: E11.9  ? Continuous Blood Gluc Sensor (FREESTYLE LIBRE 2 SENSOR) MISC USE AS DIRECTED UP TO SIX TIMES DAILY. CHANGE EVERY 14 DAYS  ? diclofenac Sodium (VOLTAREN) 1 % GEL Apply 2 g topically 4 (four) times daily.  ? diphenhydrAMINE (BENADRYL) 25 MG tablet Take 25 mg by mouth every 6 (six) hours as needed.  ? doxycycline (VIBRA-TABS) 100 MG tablet Take 1 tablet (100 mg total) by mouth 2 (two) times daily for 14 days. 1 po bid  ? doxylamine, Sleep, (UNISOM) 25 MG tablet Take 25 mg by mouth at bedtime as needed for sleep.   ? empagliflozin (JARDIANCE) 25  MG TABS tablet Take 1 tablet (25 mg total) by mouth in the morning.  ? esomeprazole (NEXIUM) 40 MG capsule TAKE (1) CAPSULE DAILY  ? glucose blood (ONETOUCH ULTRA) test strip Check blood sugar 4 times daily Dx E 11.40  ? Insulin Pen Needle (PEN NEEDLES) 32G X 5 MM MISC 1 Device by Does not apply route daily.  ? JANUVIA 100 MG tablet TAKE 1 TABLET DAILY  ? MELATONIN PO Take by mouth.  ? metFORMIN (GLUCOPHAGE-XR) 500 MG 24 hr tablet TAKE 2 TABLETS 2 TIMES A DAY WITH  MEALS  ? mupirocin ointment (BACTROBAN) 2 % Apply 1 application topically 2 (two) times daily.  ? olmesartan (BENICAR) 20 MG tablet TAKE 1 TABLET ONCE DAILY  ? Probiotic Product (PROBIOTIC PO) Take 1 tablet by mouth daily.  ? simvastatin (ZOCOR) 40 MG tablet TAKE 1 TABLET ONCE DAILY IN THE EVENING  ? tamsulosin (FLOMAX) 0.4 MG CAPS capsule Take 1 capsule (0.4 mg total) by mouth at bedtime.  ? TRESIBA FLEXTOUCH 100 UNIT/ML FlexTouch Pen Inject 0.06-0.2 mLs (6-20 Units total) into the skin daily at 10 pm.  ? triamcinolone ointment (KENALOG) 0.5 % APPLY TO AFFECTED AREAS TWICE A DAY  ? ?No facility-administered encounter medications on file as of 10/09/2021.  ? ? ?Allergies  ?Allergen Reactions  ? Ace Inhibitors Cough  ? Trazamine [Trazodone & Diet Manage Prod] Other (See Comments)  ?  nightmares  ? ? ?Review of Systems  ?Constitutional:  Negative for activity change, appetite change, chills, diaphoresis, fatigue, fever and unexpected weight change.  ?HENT: Negative.    ?Eyes: Negative.  Negative for photophobia and visual disturbance.  ?Respiratory:  Negative for apnea, cough, choking, chest tightness, shortness of breath, wheezing and stridor.   ?Cardiovascular:  Negative for chest pain, palpitations and leg swelling.  ?Gastrointestinal:  Negative for abdominal pain, blood in stool, constipation, diarrhea, nausea and vomiting.  ?Endocrine: Negative.  Negative for cold intolerance, heat intolerance, polydipsia, polyphagia and polyuria.  ?Genitourinary:   Negative for decreased urine volume, difficulty urinating, dysuria, frequency and urgency.  ?Musculoskeletal:  Positive for gait problem (unsteady and forward leaning). Negative for arthralgias and myalgias.  ?Skin:  Po

## 2021-10-09 NOTE — Patient Instructions (Addendum)
Continue to monitor your blood sugars as we discussed and record them. Bring the log to your next appointment.  Take your medications as directed.   ? ?Goal Blood glucose:  ?  Fasting (before meals) = 80 to 130 ?  Within 2 hours of eating = less than 180 ? ? ?Understanding your Hemoglobin A1c: 8.5 ? ? ? ? ?Diabetes Mellitus and Nutrition ? ? ? ?I think that you would greatly benefit from seeing a nutritionist. If this is something you are interested in, please call Dr Jenne Campus at [phone] to schedule an appointment. ? ? ?When you have diabetes (diabetes mellitus), it is very important to have healthy eating habits because your blood sugar (glucose) levels are greatly affected by what you eat and drink. Eating healthy foods in the appropriate amounts, at about the same times every day, can help you: ?Control your blood glucose. ?Lower your risk of heart disease. ?Improve your blood pressure. ?Reach or maintain a healthy weight. ? ?Every person with diabetes is different, and each person has different needs for a meal plan. Your health care provider may recommend that you work with a diet and nutrition specialist (dietitian) to make a meal plan that is best for you. Your meal plan may vary depending on factors such as: ?The calories you need. ?The medicines you take. ?Your weight. ?Your blood glucose, blood pressure, and cholesterol levels. ?Your activity level. ?Other health conditions you have, such as heart or kidney disease. ? ?How do carbohydrates affect me? ?Carbohydrates affect your blood glucose level more than any other type of food. Eating carbohydrates naturally increases the amount of glucose in your blood. Carbohydrate counting is a method for keeping track of how many carbohydrates you eat. Counting carbohydrates is important to keep your blood glucose at a healthy level, especially if you use insulin or take certain oral diabetes medicines. ?It is important to know how many carbohydrates you can safely  have in each meal. This is different for every person. Your dietitian can help you calculate how many carbohydrates you should have at each meal and for snack. ?Foods that contain carbohydrates include: ?Bread, cereal, rice, pasta, and crackers. ?Potatoes and corn. ?Peas, beans, and lentils. ?Milk and yogurt. ?Fruit and juice. ?Desserts, such as cakes, cookies, ice cream, and candy. ? ?How does alcohol affect me? ?Alcohol can cause a sudden decrease in blood glucose (hypoglycemia), especially if you use insulin or take certain oral diabetes medicines. Hypoglycemia can be a life-threatening condition. Symptoms of hypoglycemia (sleepiness, dizziness, and confusion) are similar to symptoms of having too much alcohol. ?If your health care provider says that alcohol is safe for you, follow these guidelines: ?Limit alcohol intake to no more than 1 drink per day for nonpregnant women and 2 drinks per day for men. One drink equals 12 oz of beer, 5 oz of wine, or 1? oz of hard liquor. ?Do not drink on an empty stomach. ?Keep yourself hydrated with water, diet soda, or unsweetened iced tea. ?Keep in mind that regular soda, juice, and other mixers may contain a lot of sugar and must be counted as carbohydrates. ? ?What are tips for following this plan? ? ?Reading food labels ?Start by checking the serving size on the label. The amount of calories, carbohydrates, fats, and other nutrients listed on the label are based on one serving of the food. Many foods contain more than one serving per package. ?Check the total grams (g) of carbohydrates in one serving. You can calculate  number of servings of carbohydrates in one serving by dividing the total carbohydrates by 15. For example, if a food has 30 g of total carbohydrates, it would be equal to 2 servings of carbohydrates. Check the number of grams (g) of saturated and trans fats in one serving. Choose foods that have low or no amount of these fats. Check the number of  milligrams (mg) of sodium in one serving. Most people should limit total sodium intake to less than 2,300 mg per day. Always check the nutrition information of foods labeled as "low-fat" or "nonfat". These foods may be higher in added sugar or refined carbohydrates and should be avoided. Talk to your dietitian to identify your daily goals for nutrients listed on the label.  Shopping Avoid buying canned, premade, or processed foods. These foods tend to be high in fat, sodium, and added sugar. Shop around the outside edge of the grocery store. This includes fresh fruits and vegetables, bulk grains, fresh meats, and fresh dairy.  Cooking Use low-heat cooking methods, such as baking, instead of high-heat cooking methods like deep frying. Cook using healthy oils, such as olive, canola, or sunflower oil. Avoid cooking with butter, cream, or high-fat meats.  Meal planning Eat meals and snacks regularly, preferably at the same times every day. Avoid going long periods of time without eating. Eat foods high in fiber, such as fresh fruits, vegetables, beans, and whole grains. Talk to your dietitian about how many servings of carbohydrates you can eat at each meal. Eat 4-6 ounces of lean protein each day, such as lean meat, chicken, fish, eggs, or tofu. 1 ounce is equal to 1 ounce of meat, chicken, or fish, 1 egg, or 1/4 cup of tofu. Eat some foods each day that contain healthy fats, such as avocado, nuts, seeds, and fish.  Lifestyle  Check your blood glucose regularly. Exercise at least 30 minutes 5 or more days each week, or as told by your health care provider. Take medicines as told by your health care provider. Do not use any products that contain nicotine or tobacco, such as cigarettes and e-cigarettes. If you need help quitting, ask your health care provider. Work with a counselor or diabetes educator to identify strategies to manage stress and any emotional and social challenges.  What are  some questions to ask my health care provider? Do I need to meet with a diabetes educator? Do I need to meet with a dietitian? What number can I call if I have questions? When are the best times to check my blood glucose?  Where to find more information: American Diabetes Association: diabetes.org/food-and-fitness/food Academy of Nutrition and Dietetics: www.eatright.org/resources/health/diseases-and-conditions/diabetes National Institute of Diabetes and Digestive and Kidney Diseases (NIH): www.niddk.nih.gov/health-information/diabetes/overview/diet-eating-physical-activity  Summary A healthy meal plan will help you control your blood glucose and maintain a healthy lifestyle. Working with a diet and nutrition specialist (dietitian) can help you make a meal plan that is best for you. Keep in mind that carbohydrates and alcohol have immediate effects on your blood glucose levels. It is important to count carbohydrates and to use alcohol carefully. This information is not intended to replace advice given to you by your health care provider. Make sure you discuss any questions you have with your health care provider. Document Released: 02/14/2005 Document Revised: 06/24/2016 Document Reviewed: 06/24/2016 Elsevier Interactive Patient Education  2018 Elsevier Inc.    

## 2021-10-17 ENCOUNTER — Ambulatory Visit: Payer: Medicare PPO | Admitting: Family Medicine

## 2021-10-17 ENCOUNTER — Encounter: Payer: Self-pay | Admitting: Family Medicine

## 2021-10-17 VITALS — BP 128/67 | HR 64 | Temp 97.4°F | Ht 69.0 in | Wt 164.0 lb

## 2021-10-17 DIAGNOSIS — H6121 Impacted cerumen, right ear: Secondary | ICD-10-CM | POA: Diagnosis not present

## 2021-10-17 DIAGNOSIS — S00412A Abrasion of left ear, initial encounter: Secondary | ICD-10-CM

## 2021-10-17 DIAGNOSIS — T148XXA Other injury of unspecified body region, initial encounter: Secondary | ICD-10-CM

## 2021-10-17 NOTE — Progress Notes (Signed)
?  ? ?Subjective:  ?[name], male    DOB: [birth year], 81 y.o.   MRN: [id number] ? ?Patient Care Team: ?Baruch Gouty, FNP as PCP - General (Family Medicine) ?Irine Seal, MD (Urology) ?Gatha Mayer, MD (Gastroenterology) ?Minus Breeding, MD as Consulting Physician (Cardiology) ?Lavera Guise, [facility] (Pharmacist) ?Harlen Labs, MD as Referring Physician (Optometry) ?Lavera [facility]ise, [facility] as Pharmacist (Family Medicine)  ? ?Chief Complaint:  Ear Fullness ? ? ?HPI: ?Joe [name] is a 81 y.o. male presenting on 10/17/2021 for Ear Fullness ? ? ?Pt presents today with complaints of decreased hearing and ear fullness, right ear. He also has abrasions to his left ear, states he fell causing the abrasion a few days ago. No other associated injuries. No LOC or headache. Tripped and fell into flower bush.  ? ?Ear Fullness  ?There is pain in the right ear. This is a new problem. The current episode started in the past 7 days. The problem occurs constantly. The problem has been unchanged. There has been no fever. The patient is experiencing no pain. He has tried nothing for the symptoms.  ? ? ?Relevant past medical, surgical, family, and social history reviewed and updated as indicated.  ?Allergies and medications reviewed and updated. Data reviewed: Chart in Epic. ? ? ?Past Medical History:  ?Diagnosis Date  ? Adenomatous colon polyp   ? Arthritis   ? Cataract   ? Chest pain at rest 05/26/2016  ? Diabetes mellitus without complication ([place])   ? Dyslipidemia   ? ESOPHAGITIS, REFLUX 07/09/2004  ? Qualifier: Diagnosis of  By: Hardin Negus CMA Deborra Medina), [place]    ? Hiatal hernia   ? HOH (hard of hearing)   ? has bilateral Hearing aids  ? Hypertension   ? Irritable bowel syndrome   ? Kidney stone 1970  ? Pneumonia   ? Tick bites   ? took 5 rounds of Doxycycline this summer  ? ? ?Past Surgical History:  ?Procedure Laterality Date  ? COLONOSCOPY    ? ESOPHAGEAL DILATION  1996  ? ESOPHAGEAL DILATION  2003  ?  FLEXIBLE SIGMOIDOSCOPY    ? HEMICOLECTOMY  08/2005  ? Right  ? KNEE SURGERY Right   ? TONSILLECTOMY    ? ? ?Social History  ? ?Socioeconomic History  ? Marital status: Married  ?  Spouse name: [name]  ? Number of children: 1  ? Years of education: 45  ? Highest education level: Bachelor's degree (e.g., BA, AB, BS)  ?Occupational History  ? Occupation: Retired Pharmacist, hospital  ?Tobacco Use  ? Smoking status: Former  ?  Packs/day: 2.00  ?  Types: Cigarettes  ?  Start date: 06/03/1954  ?  Quit date: 08/24/1984  ?  Years since quitting: 37.1  ? Smokeless tobacco: Never  ? Tobacco comments:  ?  Has not used to tobacco products for the past 20 years  ?Vaping Use  ? Vaping Use: Never used  ?Substance and Sexual Activity  ? Alcohol use: Yes  ?  Alcohol/week: 7.0 standard drinks  ?  Types: 7 Standard drinks or equivalent per week  ?  Comment: [place] at night when he sits on the deck  ? Drug use: No  ? Sexual activity: Yes  ?  Partners: Female  ?Other Topics Concern  ? Not on file  ?Social History Narrative  ? 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 1 son  ? 3 caffeinated beverages daily  ?  1 alcoholic beverage daily  ? Former smoker no current tobacco no drug use  ? ?Social Determinants of Health  ? ?Financial Resource Strain: Low Risk   ? Difficulty of Paying Living Expenses: Not hard at all  ?Food Insecurity: No Food Insecurity  ? Worried About Charity fundraiser in the Last Year: Never true  ? Ran Out of Food in the Last Year: Never true  ?Transportation Needs: No Transportation Needs  ? Lack of Transportation (Medical): No  ? Lack of Transportation (Non-Medical): No  ?Physical Activity: Insufficiently Active  ? Days of Exercise per Week: 3 days  ? Minutes of Exercise per Session: 20 min  ?Stress: No Stress Concern Present  ? Feeling of Stress : Not at all  ?Social Connections: Moderately Isolated  ? Frequency of Communication with Friends and Family: More than three times a week  ? Frequency of Social Gatherings  with Friends and Family: Once a week  ? Attends Religious Services: Never  ? Active Member of Clubs or Organizations: No  ? Attends Archivist Meetings: Never  ? Marital Status: Married  ?Intimate Partner Violence: Not At Risk  ? Fear of Current or Ex-Partner: No  ? Emotionally Abused: No  ? Physically Abused: No  ? Sexually Abused: No  ? ? ?Outpatient Encounter Medications as of 10/17/2021  ?Medication Sig  ? Accu-Chek Softclix Lancets lancets CHECK BLOOD SUGAR 4 TIMES A DAY OR AS DIRECTED  ? aspirin 81 MG tablet Take 81 mg by mouth daily.  ? Blood Glucose Monitoring Suppl w/Device KIT Test BS BID and PRN dx E11.9. Give test strips and lancets to match machine given  ? cholecalciferol (VITAMIN D) 1000 UNITS tablet Take 2,000 Units by mouth daily.  ? Cinnamon 500 MG TABS Take 1,000 mg by mouth daily.  ? Continuous Blood Gluc Receiver (FREESTYLE LIBRE 2 READER) DEVI Use to test blood sugars up to 6 times daily as directed. DX: E11.9  ? Continuous Blood Gluc Sensor (FREESTYLE LIBRE 2 SENSOR) MISC USE AS DIRECTED UP TO SIX TIMES DAILY. CHANGE EVERY 14 DAYS  ? diclofenac Sodium (VOLTAREN) 1 % GEL Apply 2 g topically 4 (four) times daily.  ? diphenhydrAMINE (BENADRYL) 25 MG tablet Take 25 mg by mouth every 6 (six) hours as needed.  ? doxycycline (VIBRA-TABS) 100 MG tablet Take 1 tablet (100 mg total) by mouth 2 (two) times daily for 14 days. 1 po bid  ? doxylamine, Sleep, (UNISOM) 25 MG tablet Take 25 mg by mouth at bedtime as needed for sleep.   ? empagliflozin (JARDIANCE) 25 MG TABS tablet Take 1 tablet (25 mg total) by mouth in the morning.  ? esomeprazole (NEXIUM) 40 MG capsule TAKE (1) CAPSULE DAILY  ? glucose blood (ONETOUCH ULTRA) test strip Check blood sugar 4 times daily Dx E 11.40  ? Insulin Pen Needle (PEN NEEDLES) 32G X 5 MM MISC 1 Device by Does not apply route daily.  ? JANUVIA 100 MG tablet TAKE 1 TABLET DAILY  ? MELATONIN PO Take by mouth.  ? metFORMIN (GLUCOPHAGE-XR) 500 MG 24 hr tablet TAKE 2  TABLETS 2 TIMES A DAY WITH MEALS  ? mupirocin ointment (BACTROBAN) 2 % Apply 1 application topically 2 (two) times daily.  ? olmesartan (BENICAR) 20 MG tablet TAKE 1 TABLET ONCE DAILY  ? Probiotic Product (PROBIOTIC PO) Take 1 tablet by mouth daily.  ? simvastatin (ZOCOR) 40 MG tablet TAKE 1 TABLET ONCE DAILY IN THE EVENING  ? tamsulosin (FLOMAX) 0.4  MG CAPS capsule Take 1 capsule (0.4 mg total) by mouth at bedtime.  ? TRESIBA FLEXTOUCH 100 UNIT/ML FlexTouch Pen Inject 0.06-0.2 mLs (6-20 Units total) into the skin daily at 10 pm.  ? triamcinolone ointment (KENALOG) 0.5 % APPLY TO AFFECTED AREAS TWICE A DAY  ? ?No facility-administered encounter medications on file as of 10/17/2021.  ? ? ?Allergies  ?Allergen Reactions  ? Ace Inhibitors Cough  ? Trazamine [Trazodone & Diet Manage Prod] Other (See Comments)  ?  nightmares  ? ? ?Review of Systems ? ?   ? ?Objective:  ?BP 128/67   Pulse 64   Temp (!) 97.4 ?F (36.3 ?C)   Ht 5' 9"  (1.753 m)   Wt 164 lb (74.4 kg)   SpO2 98%   BMI 24.22 kg/m?   ? ?Wt Readings from Last 3 Encounters:  ?10/17/21 164 lb (74.4 kg)  ?10/09/21 163 lb (73.9 kg)  ?09/11/21 162 lb 12.8 oz (73.8 kg)  ? ? ?Physical Exam ?Vitals and nursing note reviewed.  ?Constitutional:   ?   General: He is not in acute distress. ?   Appearance: Normal appearance. He is normal weight. He is not ill-appearing, toxic-appearing or diaphoretic.  ?HENT:  ?   Head: Normocephalic and atraumatic.  ?   Right Ear: Decreased hearing noted. There is impacted cerumen.  ?   Left Ear: Tympanic membrane, ear canal and external ear normal. Decreased hearing noted.  ?Eyes:  ?   Pupils: Pupils are equal, round, and reactive to light.  ?Cardiovascular:  ?   Rate and Rhythm: Normal rate.  ?Pulmonary:  ?   Effort: Pulmonary effort is normal.  ?Skin: ?   General: Skin is warm and dry.  ?   Capillary Refill: Capillary refill takes less than 2 seconds.  ?   Findings: Abrasion (Left ear. No swelling, erythema, or drainage) and wound  present.  ?Neurological:  ?   General: No focal deficit present.  ?   Mental Status: He is alert and oriented to person, place, and time.  ?   Gait: Gait abnormal (forward leaning).  ?Psychiatric:     ?   Mood and Affe

## 2021-10-18 ENCOUNTER — Ambulatory Visit: Payer: Medicare PPO | Attending: Family Medicine

## 2021-10-18 DIAGNOSIS — R2689 Other abnormalities of gait and mobility: Secondary | ICD-10-CM | POA: Insufficient documentation

## 2021-10-18 DIAGNOSIS — M6281 Muscle weakness (generalized): Secondary | ICD-10-CM | POA: Diagnosis not present

## 2021-10-18 DIAGNOSIS — R2681 Unsteadiness on feet: Secondary | ICD-10-CM | POA: Insufficient documentation

## 2021-10-18 NOTE — Therapy (Addendum)
[facility] [place], [place], [address] Phone: [phone]   Fax:  [phone]  Physical Therapy Evaluation  Patient Details  Name: Joe [name] MRN: [id number] Date of Birth: [birth year] Referring Provider (PT): Rakes, FNP   Encounter Date: 10/18/2021   PT End of Session - 10/18/21 0950     Visit Number 1    Number of Visits 12    Date for PT Re-Evaluation 12/28/21    PT Start Time 0951    PT Stop Time 1030    PT Time Calculation (min) 39 min    Activity Tolerance Patient tolerated treatment well    Behavior During Therapy [facility] for tasks assessed/performed             Past Medical History:  Diagnosis Date   Adenomatous colon polyp    Arthritis    Cataract    Chest pain at rest 05/26/2016   Diabetes mellitus without complication ([place])    Dyslipidemia    ESOPHAGITIS, REFLUX 07/09/2004   Qualifier: Diagnosis of  By: Hardin Negus CMA Deborra Medina), Stephanie     Hiatal hernia    HOH (hard of hearing)    has bilateral Hearing aids   Hypertension    Irritable bowel syndrome    Kidney stone 1970   Pneumonia    Tick bites    took 5 rounds of Doxycycline this summer    Past Surgical History:  Procedure Laterality Date   COLONOSCOPY     [place]   ESOPHAGEAL DILATION  2003   FLEXIBLE SIGMOIDOSCOPY     HEMICOLECTOMY  08/2005   Right   KNEE SURGERY Right    TONSILLECTOMY      There were no vitals filed for this visit.    Subjective Assessment - 10/18/21 0950     Subjective Patient reports that he began to have balance problems about 1 year ago with no known cause. He notes that he feels like he is gonna fall forward. His wife notes that he drags his feet and she tries to tell him to pick up his feet, but it has not helped. He fell Saturday while in the yard which caused a cut on his ear, but otherwise no injuries.    Patient is accompained by: Family member   [name])   Pertinent History  chronic back pain    Limitations Walking    Patient Stated Goals be safer with yardwork and household activities    Currently in Pain? No/denies                [facility] PT Assessment - 10/18/21 0001       Assessment   Medical Diagnosis Gait instability    Referring Provider (PT) Rakes, FNP    Onset Date/Surgical Date --   1 year ago   Next MD Visit 01/08/22    Prior Therapy No      Precautions   Precautions Fall      Restrictions   Weight Bearing Restrictions No      Balance Screen   Has the patient fallen in the past 6 months Yes    How many times? 1    Has the patient had a decrease in activity level because of a fear of falling?  No    Is the patient reluctant to leave their home because of a fear of falling?  No      Home Ecologist residence  Living Arrangements Spouse/significant other    Home Access Stairs to enter    Entrance Stairs-Number of Steps 2    Entrance Stairs-Rails Can reach both    Home Layout Two level    Alternate Level Stairs-Number of Steps 12    Alternate Level Stairs-Rails Right    Home Equipment Other (comment)   walking stick     Prior Function   Level of Independence Independent    Vocation Retired      Associate Professor   Overall Cognitive Status Within Functional Limits for tasks assessed    Attention Focused    Focused Attention Appears intact    Memory Appears intact    Awareness Appears intact    Problem Solving Appears intact      Sensation   Additional Comments Patient reports neuropathy in the toes of both feet      ROM / Strength   AROM / PROM / Strength Strength      Strength   Strength Assessment Site Hip;Knee;Ankle    Right/Left Hip Left;Right    Right Hip Flexion 4-/5    Left Hip Flexion 3+/5    Right/Left Knee Right;Left    Right Knee Flexion 4+/5    Right Knee Extension 5/5    Left Knee Flexion 4/5    Left Knee Extension 3+/5    Right/Left Ankle Left;Right    Right Ankle  Dorsiflexion 4/5    Left Ankle Dorsiflexion 4/5      Transfers   Transfers Sit to Stand;Stand to Sit    Sit to Stand 6: Modified independent (Device/Increase time);With upper extremity assist;With armrests    Stand to Sit 6: Modified independent (Device/Increase time);With upper extremity assist;With armrests      Ambulation/Gait   Ambulation/Gait Yes    Ambulation/Gait Assistance 6: Modified independent (Device/Increase time)    Gait Pattern Shuffle;Trunk flexed    Ambulation Surface Level;Indoor    Gait velocity 0.63 m/s   19.81 seconds to walk 41 feet     Balance   Balance Assessed Yes      Static Standing Balance   Static Standing Balance -  Activities  Romberg - Eyes Opened;Romberg - Eyes Closed;Tandam Stance - Right Leg;Tandam Stance - Left Leg    Static Standing - Comment/# of Minutes Romberg: 30 seconds each (EO: increased sway to the posterior left quadrant) (EC: increased anterior sway) Tandem: 3 seconds each      Standardized Balance Assessment   Standardized Balance Assessment Timed Up and Go Test      Timed Up and Go Test   TUG Normal TUG    Normal TUG (seconds) 20.38                        Objective measurements completed on examination: See above findings.                     PT Long Term Goals - 10/18/21 1419       PT LONG TERM GOAL #1   Title Patient will be independent with his HEP.    Time 6    Period Weeks    Status New    Target Date 11/29/21      PT LONG TERM GOAL #2   Title Patient will improve his TUG time to 14 seconds or less for improved safety and to reduce his fall risk.    Time 6    Period Weeks    Status New  Target Date 11/29/21      PT LONG TERM GOAL #3   Title Patient will be able to improve his gait speed to at least 0.8 m/s for improved household and community mobility.    Time 6    Period Weeks    Status New    Target Date 11/29/21                    Plan - 10/18/21 1412      Clinical Impression Statement Patient is a 81 year old male presenting to physical therapy with instability and difficulty walking. He is an elevated fall risk due to his history of falling, TUG time, gait speed, and lower extremity strength. He exhibited reduced left lower extremity muscular strength compared to the right. He also exhibited gait deviations while walking which included a shuffling pattern which resulted in reduced foot clearance. Recommend that he continue with skilled physical therapy to address his remaining impairments to maxmize his safety and functional mobility.    Personal Factors and Comorbidities Comorbidity 3+;Time since onset of injury/illness/exacerbation;Age    Comorbidities HTN, DM, neuropathy, CKD,    Examination-Activity Limitations Locomotion Level;Transfers;Lift;Stand;Stairs;Dressing    Examination-Participation Restrictions Yard Work;Cleaning;Community Activity    Stability/Clinical Decision Making Evolving/Moderate complexity    Clinical Decision Making Moderate    Rehab Potential Fair    PT Frequency 2x / week    PT Duration 6 weeks    PT Treatment/Interventions ADLs/Self Care Home Management;Neuromuscular re-education;Balance training;Therapeutic exercise;Therapeutic activities;Functional mobility training;Gait training;Stair training;Patient/family education    PT Next Visit Plan nustep, lower extremity strengthening and balance interventions    Consulted and Agree with Plan of Care Patient;Family member/caregiver    Family Member Consulted [name])             Patient will benefit from skilled therapeutic intervention in order to improve the following deficits and impairments:  Abnormal gait, Difficulty walking, Decreased activity tolerance, Decreased balance, Decreased strength, Decreased mobility  Visit Diagnosis: Other abnormalities of gait and mobility - Plan: PT plan of care cert/re-cert  Muscle weakness (generalized) - Plan: PT plan of  care cert/re-cert     Problem List Patient Active Problem List   Diagnosis Date Noted   Gait instability 10/09/2021   Exposure to Agent Orange 04/04/2021   Seborrheic dermatitis, unspecified 04/04/2021   Presbycusis of both ears 05/09/2020   Chronic mycotic otitis externa 09/29/2019   Chronic left shoulder pain 08/13/2019   CKD stage 3 due to type 2 diabetes mellitus ([place]) 01/27/2019   Erectile dysfunction due to arterial insufficiency 04/15/2018   Thrombocytopenia ([place]) 03/18/2015   Atopic dermatitis 01/23/2015   Thoracic disc herniation 06/20/2014   Left varicocele 06/20/2014   Abdominal aortic atherosclerosis ([place]) 06/20/2014   Ventral hernia without obstruction or gangrene 02/04/2014   History of colonic polyps 02/04/2014   Hyperlipidemia associated with type 2 diabetes mellitus ([place]) 12/30/2012   Hypertension associated with diabetes ([place]) 12/30/2012   Benign prostatic hyperplasia 12/30/2012   Type 2 diabetes mellitus with diabetic neuropathy, with long-term current use of insulin ([place]) 09/28/2012   RBBB 04/11/2010   Cardiovascular function study, abnormal 04/11/2010   Gastroesophageal reflux disease without esophagitis 09/14/2007   Nephrolithiasis 09/14/2007    Darlin Coco, PT 10/18/2021, 3:55 PM  [facility] [address] [place], [place], [address] Phone: [phone]   Fax:  [phone]  Name: Joe [name] MRN: [id number] Date of Birth: [birth year]

## 2021-10-22 ENCOUNTER — Encounter: Payer: Self-pay | Admitting: Physical Therapy

## 2021-10-22 ENCOUNTER — Ambulatory Visit: Payer: Medicare PPO | Admitting: Physical Therapy

## 2021-10-22 DIAGNOSIS — R2681 Unsteadiness on feet: Secondary | ICD-10-CM | POA: Diagnosis not present

## 2021-10-22 DIAGNOSIS — R2689 Other abnormalities of gait and mobility: Secondary | ICD-10-CM | POA: Diagnosis not present

## 2021-10-22 DIAGNOSIS — M6281 Muscle weakness (generalized): Secondary | ICD-10-CM | POA: Diagnosis not present

## 2021-10-22 NOTE — Therapy (Signed)
[facility] [place], [place], [address] Phone: [phone]   Fax:  [phone]  Physical Therapy Treatment  Patient Details  Name: Joe [name] MRN: [id number] Date of Birth: 09-23-1940 Referring Provider (PT): Rakes, FNP   Encounter Date: 10/22/2021   PT End of Session - 10/22/21 1037     Visit Number 2    Number of Visits 12    Date for PT Re-Evaluation 12/28/21    PT Start Time 9379    PT Stop Time 1122    PT Time Calculation (min) 45 min    Activity Tolerance Patient tolerated treatment well    Behavior During Therapy [facility] for tasks assessed/performed             Past Medical History:  Diagnosis Date   Adenomatous colon polyp    Arthritis    Cataract    Chest pain at rest 05/26/2016   Diabetes mellitus without complication ([place])    Dyslipidemia    ESOPHAGITIS, REFLUX 07/09/2004   Qualifier: Diagnosis of  By: Hardin Negus CMA Joe [name]), Joe [name]     Hiatal hernia    HOH (hard of hearing)    has bilateral Hearing aids   Hypertension    Irritable bowel syndrome    Kidney stone 1970   Pneumonia    Tick bites    took 5 rounds of Doxycycline this summer    Past Surgical History:  Procedure Laterality Date   COLONOSCOPY     [place]   ESOPHAGEAL DILATION  2003   FLEXIBLE SIGMOIDOSCOPY     HEMICOLECTOMY  08/2005   Right   KNEE SURGERY Right    TONSILLECTOMY      There were no vitals filed for this visit.   Subjective Assessment - 10/22/21 1343     Subjective reports only his normal LBP. His latest fall was when he missed a step while outside and fell backwards. He and his wife were going to the gym prior to [place] and have stopped.    Patient is accompained by: Family member   wife   Pertinent History chronic back pain    Limitations Walking    Patient Stated Goals be safer with yardwork and household activities    Currently in Pain? Yes    Pain Score 3     Pain Location Back     Pain Orientation Lower    Pain Descriptors / Indicators Discomfort    Pain Type Chronic pain    Pain Onset More than a month ago    Pain Frequency Constant                [facility] PT Assessment - 10/22/21 0001       Assessment   Medical Diagnosis Gait instability    Referring Provider (PT) Rakes, FNP    Next MD Visit 01/08/22    Prior Therapy No      Precautions   Precautions Fall      Restrictions   Weight Bearing Restrictions No                           [facility] Adult PT Treatment/Exercise - 10/22/21 0001       Exercises   Exercises Lumbar;Knee/Hip      Knee/Hip Exercises: Aerobic   Nustep L4 x15.5 min; 1000 steps      Knee/Hip Exercises: Machines for Strengthening   Cybex Knee Extension 10# 3x10 reps  Cybex Knee Flexion 30# 3x10 reps      Knee/Hip Exercises: Standing   Heel Raises Both;20 reps    Heel Raises Limitations B toe raise x20 reps    Hip Abduction AROM;Both;20 reps;Knee straight                 Balance Exercises - 10/22/21 0001       Balance Exercises: Standing   Standing Eyes Opened Narrow base of support (BOS);Foam/compliant surface;Time    Standing Eyes Opened Time x3 min    Tandem Stance Eyes open;Upper extremity support 1;Time    Tandem Stance Limitations x2 min                     PT Long Term Goals - 10/18/21 1419       PT LONG TERM GOAL #1   Title Patient will be independent with his HEP.    Time 6    Period Weeks    Status New    Target Date 11/29/21      PT LONG TERM GOAL #2   Title Patient will improve his TUG time to 14 seconds or less for improved safety and to reduce his fall risk.    Time 6    Period Weeks    Status New    Target Date 11/29/21      PT LONG TERM GOAL #3   Title Patient will be able to improve his gait speed to at least 0.8 m/s for improved household and community mobility.    Time 6    Period Weeks    Status New    Target Date 11/29/21                    Plan - 10/22/21 1348     Clinical Impression Statement Patient presented in clinic with his "normal" LBP. His wife was present during therex to see what activities to carryover at home. Patient and wife were previously members of a gym prior to [place] and have not reinlisted. Patient progressed with light strengthening of BLE with only reports of mod fatigue. Patient very challenged by NBOS. Patient is diabetic with some neuropathy per wife. Patient and his wife encouraged to complete the strengthening exercises today as well as walking with a walking stick. He also has a Dentist at home and was encouraged to use it instead of treadmill or elliptical.    Personal Factors and Comorbidities Comorbidity 3+;Time since onset of injury/illness/exacerbation;Age    Comorbidities HTN, DM, neuropathy, CKD,    Examination-Activity Limitations Locomotion Level;Transfers;Lift;Stand;Stairs;Dressing    Examination-Participation Restrictions Yard Work;Cleaning;Community Activity    Stability/Clinical Decision Making Evolving/Moderate complexity    Rehab Potential Fair    PT Frequency 2x / week    PT Duration 6 weeks    PT Treatment/Interventions ADLs/Self Care Home Management;Neuromuscular re-education;Balance training;Therapeutic exercise;Therapeutic activities;Functional mobility training;Gait training;Stair training;Patient/family education    PT Next Visit Plan nustep, lower extremity strengthening and balance interventions    Consulted and Agree with Plan of Care Patient;Family member/caregiver    Family Member Consulted wife Joe [name])             Patient will benefit from skilled therapeutic intervention in order to improve the following deficits and impairments:  Abnormal gait, Difficulty walking, Decreased activity tolerance, Decreased balance, Decreased strength, Decreased mobility  Visit Diagnosis: Other abnormalities of gait and mobility  Muscle weakness  (generalized)     Problem List Patient Active Problem List  Diagnosis Date Noted   Gait instability 10/09/2021   Exposure to Agent Orange 04/04/2021   Seborrheic dermatitis, unspecified 04/04/2021   Presbycusis of both ears 05/09/2020   Chronic mycotic otitis externa 09/29/2019   Chronic left shoulder pain 08/13/2019   CKD stage 3 due to type 2 diabetes mellitus ([place]) 01/27/2019   Erectile dysfunction due to arterial insufficiency 04/15/2018   Thrombocytopenia ([place]) 03/18/2015   Atopic dermatitis 01/23/2015   Thoracic disc herniation 06/20/2014   Left varicocele 06/20/2014   Abdominal aortic atherosclerosis ([place]) 06/20/2014   Ventral hernia without obstruction or gangrene 02/04/2014   History of colonic polyps 02/04/2014   Hyperlipidemia associated with type 2 diabetes mellitus ([place]) 12/30/2012   Hypertension associated with diabetes ([place]) 12/30/2012   Benign prostatic hyperplasia 12/30/2012   Type 2 diabetes mellitus with diabetic neuropathy, with long-term current use of insulin ([place]) 09/28/2012   RBBB 04/11/2010   Cardiovascular function study, abnormal 04/11/2010   Gastroesophageal reflux disease without esophagitis 09/14/2007   Nephrolithiasis 09/14/2007    Standley Brooking, PTA 10/22/2021, 1:53 PM  [facility] [address] [place], [place], [address] Phone: [phone]   Fax:  [phone]  Name: Joe [name] MRN: [id number] Date of Birth: 28-Nov-1940

## 2021-10-23 DIAGNOSIS — M79676 Pain in unspecified toe(s): Secondary | ICD-10-CM | POA: Diagnosis not present

## 2021-10-23 DIAGNOSIS — B351 Tinea unguium: Secondary | ICD-10-CM | POA: Diagnosis not present

## 2021-10-23 DIAGNOSIS — L84 Corns and callosities: Secondary | ICD-10-CM | POA: Diagnosis not present

## 2021-10-23 DIAGNOSIS — E1142 Type 2 diabetes mellitus with diabetic polyneuropathy: Secondary | ICD-10-CM | POA: Diagnosis not present

## 2021-10-25 ENCOUNTER — Ambulatory Visit: Payer: Medicare PPO | Admitting: Physical Therapy

## 2021-10-25 ENCOUNTER — Encounter: Payer: Self-pay | Admitting: Physical Therapy

## 2021-10-25 DIAGNOSIS — R2681 Unsteadiness on feet: Secondary | ICD-10-CM | POA: Diagnosis not present

## 2021-10-25 DIAGNOSIS — M6281 Muscle weakness (generalized): Secondary | ICD-10-CM | POA: Diagnosis not present

## 2021-10-25 DIAGNOSIS — R2689 Other abnormalities of gait and mobility: Secondary | ICD-10-CM | POA: Diagnosis not present

## 2021-10-25 NOTE — Therapy (Signed)
[facility] [place], [place], [address] Phone: [phone]   Fax:  [phone]  Physical Therapy Treatment  Patient Details  Name: Joe [name] MRN: [id number] Date of Birth: 1941-05-16 Referring Provider (PT): Rakes, FNP   Encounter Date: 10/25/2021   PT End of Session - 10/25/21 9833     Visit Number 3    Number of Visits 12    Date for PT Re-Evaluation 12/28/21    PT Start Time 0950    PT Stop Time 1029    PT Time Calculation (min) 39 min    Activity Tolerance Patient tolerated treatment well    Behavior During Therapy [facility] for tasks assessed/performed             Past Medical History:  Diagnosis Date   Adenomatous colon polyp    Arthritis    Cataract    Chest pain at rest 05/26/2016   Diabetes mellitus without complication ([place])    Dyslipidemia    ESOPHAGITIS, REFLUX 07/09/2004   Qualifier: Diagnosis of  By: Hardin Negus CMA Deborra Medina), Stephanie     Hiatal hernia    HOH (hard of hearing)    has bilateral Hearing aids   Hypertension    Irritable bowel syndrome    Kidney stone 1970   Pneumonia    Tick bites    took 5 rounds of Doxycycline this summer    Past Surgical History:  Procedure Laterality Date   COLONOSCOPY     ESOPHAGEAL DILATION  1996   ESOPHAGEAL DILATION  2003   FLEXIBLE SIGMOIDOSCOPY     HEMICOLECTOMY  08/2005   Right   KNEE SURGERY Right    TONSILLECTOMY      There were no vitals filed for this visit.   Subjective Assessment - 10/25/21 0951     Subjective Reports that his legs felt rubbery after last visit but now doing the standing exercises twice daily per his wife.    Patient is accompained by: Family member   WIfe   Pertinent History chronic back pain    Limitations Walking    Patient Stated Goals be safer with yardwork and household activities    Currently in Pain? No/denies                [facility] PT Assessment - 10/25/21 0001       Assessment   Medical Diagnosis  Gait instability    Referring Provider (PT) Rakes, FNP    Next MD Visit 01/08/22    Prior Therapy No      Precautions   Precautions Fall      Restrictions   Weight Bearing Restrictions No                           [facility] Adult PT Treatment/Exercise - 10/25/21 0001       Lumbar Exercises: Aerobic   Recumbent Bike L2 x18 min (3 mi)      Knee/Hip Exercises: Machines for Strengthening   Cybex Knee Extension 10# 3x10 reps    Cybex Knee Flexion 30# 3x10 reps      Knee/Hip Exercises: Standing   Heel Raises Both;20 reps    Heel Raises Limitations B toe raise x20 reps    Hip Abduction AROM;Both;20 reps;Knee straight    Rocker Board 3 minutes  PT Long Term Goals - 10/18/21 1419       PT LONG TERM GOAL #1   Title Patient will be independent with his HEP.    Time 6    Period Weeks    Status New    Target Date 11/29/21      PT LONG TERM GOAL #2   Title Patient will improve his TUG time to 14 seconds or less for improved safety and to reduce his fall risk.    Time 6    Period Weeks    Status New    Target Date 11/29/21      PT LONG TERM GOAL #3   Title Patient will be able to improve his gait speed to at least 0.8 m/s for improved household and community mobility.    Time 6    Period Weeks    Status New    Target Date 11/29/21                   Plan - 10/25/21 1214     Clinical Impression Statement Patient presented in clinic with reports of feeling good. His wife agreed that he had been doing his standing exercises at home twice daily. Patient experienced more muscle fatigue and burning with exercises but had no other limitation. Patient and his wife advised to the advancement of treatment and progression to advanced balance activities.    Personal Factors and Comorbidities Comorbidity 3+;Time since onset of injury/illness/exacerbation;Age    Comorbidities HTN, DM, neuropathy, CKD,    Examination-Activity  Limitations Locomotion Level;Transfers;Lift;Stand;Stairs;Dressing    Examination-Participation Restrictions Yard Work;Cleaning;Community Activity    Stability/Clinical Decision Making Evolving/Moderate complexity    Rehab Potential Fair    PT Frequency 2x / week    PT Duration 6 weeks    PT Treatment/Interventions ADLs/Self Care Home Management;Neuromuscular re-education;Balance training;Therapeutic exercise;Therapeutic activities;Functional mobility training;Gait training;Stair training;Patient/family education    PT Next Visit Plan nustep, lower extremity strengthening and balance interventions    Consulted and Agree with Plan of Care Patient;Family member/caregiver    Family Member Consulted [name])             Patient will benefit from skilled therapeutic intervention in order to improve the following deficits and impairments:  Abnormal gait, Difficulty walking, Decreased activity tolerance, Decreased balance, Decreased strength, Decreased mobility  Visit Diagnosis: Other abnormalities of gait and mobility  Muscle weakness (generalized)     Problem List Patient Active Problem List   Diagnosis Date Noted   Gait instability 10/09/2021   Exposure to Agent Orange 04/04/2021   Seborrheic dermatitis, unspecified 04/04/2021   Presbycusis of both ears 05/09/2020   Chronic mycotic otitis externa 09/29/2019   Chronic left shoulder pain 08/13/2019   CKD stage 3 due to type 2 diabetes mellitus ([place]) 01/27/2019   Erectile dysfunction due to arterial insufficiency 04/15/2018   Thrombocytopenia ([place]) 03/18/2015   Atopic dermatitis 01/23/2015   Thoracic disc herniation 06/20/2014   Left varicocele 06/20/2014   Abdominal aortic atherosclerosis ([place]) 06/20/2014   Ventral hernia without obstruction or gangrene 02/04/2014   History of colonic polyps 02/04/2014   Hyperlipidemia associated with type 2 diabetes mellitus ([place]) 12/30/2012   Hypertension associated with diabetes ([place])  12/30/2012   Benign prostatic hyperplasia 12/30/2012   Type 2 diabetes mellitus with diabetic neuropathy, with long-term current use of insulin ([place]) 09/28/2012   RBBB 04/11/2010   Cardiovascular function study, abnormal 04/11/2010   Gastroesophageal reflux disease without esophagitis 09/14/2007   Nephrolithiasis 09/14/2007  Standley Brooking, PTA 10/25/2021, 12:16 PM  [facility] [address] [place], [place], [address] Phone: [phone]   Fax:  [phone]  Name: JAISHON KRISHER MRN: [id number] Date of Birth: 1940-12-10

## 2021-10-30 ENCOUNTER — Other Ambulatory Visit: Payer: Self-pay | Admitting: Family Medicine

## 2021-10-30 DIAGNOSIS — E1140 Type 2 diabetes mellitus with diabetic neuropathy, unspecified: Secondary | ICD-10-CM

## 2021-11-01 ENCOUNTER — Ambulatory Visit: Payer: Medicare PPO | Attending: Family Medicine

## 2021-11-01 DIAGNOSIS — M6281 Muscle weakness (generalized): Secondary | ICD-10-CM | POA: Diagnosis not present

## 2021-11-01 DIAGNOSIS — M25512 Pain in left shoulder: Secondary | ICD-10-CM | POA: Insufficient documentation

## 2021-11-01 DIAGNOSIS — H40033 Anatomical narrow angle, bilateral: Secondary | ICD-10-CM | POA: Diagnosis not present

## 2021-11-01 DIAGNOSIS — R2689 Other abnormalities of gait and mobility: Secondary | ICD-10-CM | POA: Diagnosis not present

## 2021-11-01 DIAGNOSIS — E119 Type 2 diabetes mellitus without complications: Secondary | ICD-10-CM | POA: Diagnosis not present

## 2021-11-01 NOTE — Therapy (Signed)
[facility] [place], [place], [address] Phone: [phone]   Fax:  [phone]  Physical Therapy Treatment  Patient Details  Name: Joe [name] MRN: [id number] Date of Birth: [birth year] Referring Provider (PT): Rakes, FNP   Encounter Date: 11/01/2021   PT End of Session - 11/01/21 0904     Visit Number 4    Number of Visits 12    Date for PT Re-Evaluation 12/28/21    PT Start Time 0900    PT Stop Time 0945    PT Time Calculation (min) 45 min    Activity Tolerance Patient tolerated treatment well    Behavior During Therapy [facility] for tasks assessed/performed             Past Medical History:  Diagnosis Date   Adenomatous colon polyp    Arthritis    Cataract    Chest pain at rest 05/26/2016   Diabetes mellitus without complication ([place])    Dyslipidemia    ESOPHAGITIS, REFLUX 07/09/2004   Qualifier: Diagnosis of  By: Hardin Negus CMA Deborra Medina), Stephanie     Hiatal hernia    HOH (hard of hearing)    has bilateral Hearing aids   Hypertension    Irritable bowel syndrome    Kidney stone 1970   Pneumonia    Tick bites    took 5 rounds of Doxycycline this summer    Past Surgical History:  Procedure Laterality Date   COLONOSCOPY     ESOPHAGEAL DILATION  1996   ESOPHAGEAL DILATION  2003   FLEXIBLE SIGMOIDOSCOPY     HEMICOLECTOMY  08/2005   Right   KNEE SURGERY Right    TONSILLECTOMY      There were no vitals filed for this visit.   Subjective Assessment - 11/01/21 0903     Subjective Pt arrives for today's treatment session denying any pain.  Pt reports performing exercises at home with wife validating.    Patient is accompained by: Family member   WIfe   Pertinent History chronic back pain    Limitations Walking    Patient Stated Goals be safer with yardwork and household activities    Currently in Pain? No/denies                               [facility] Adult PT Treatment/Exercise -  11/01/21 0001       Lumbar Exercises: Aerobic   Recumbent Bike Lvl 3 x 20 mins      Knee/Hip Exercises: Machines for Strengthening   Cybex Knee Extension 20# 3x10 reps    Cybex Knee Flexion 40# 3x10 reps    Cybex Leg Press 2 plates x 30 reps      Knee/Hip Exercises: Standing   Heel Raises Both;20 reps;10 reps    Heel Raises Limitations B toe raises x 30 reps                 Balance Exercises - 11/01/21 0001       Balance Exercises: Standing   Standing Eyes Opened Narrow base of support (BOS);Foam/compliant surface;2 reps   1 min each rep   Tandem Stance Eyes open;Upper extremity support 1;Foam/compliant surface;4 reps;30 secs    Tandem Stance Limitations Alternating LE    Sidestepping Foam/compliant support;5 reps   Intermittent UE support                    PT Long  Term Goals - 10/18/21 1419       PT LONG TERM GOAL #1   Title Patient will be independent with his HEP.    Time 6    Period Weeks    Status New    Target Date 11/29/21      PT LONG TERM GOAL #2   Title Patient will improve his TUG time to 14 seconds or less for improved safety and to reduce his fall risk.    Time 6    Period Weeks    Status New    Target Date 11/29/21      PT LONG TERM GOAL #3   Title Patient will be able to improve his gait speed to at least 0.8 m/s for improved household and community mobility.    Time 6    Period Weeks    Status New    Target Date 11/29/21                   Plan - 11/01/21 0904     Clinical Impression Statement Pt arrives for today's treatment session denying any pain.  Pt able to tolerate increased weight on cybex knee flexion and extension without issue or complaint.  Pt instructed in use of leg press with pt requiring cues for eccentric control.  Pt progressed to balance activities on Airex today for increased challenge.  Pt requiring CGA with all balance activities and intermitted UE support with all tandem stance activities and  side-stepping on compliant balance beam.  Pt denied any pain at completion of today's treatment session, but does endorse LE "feeling like jello."    Personal Factors and Comorbidities Comorbidity 3+;Time since onset of injury/illness/exacerbation;Age    Comorbidities HTN, DM, neuropathy, CKD,    Examination-Activity Limitations Locomotion Level;Transfers;Lift;Stand;Stairs;Dressing    Examination-Participation Restrictions Yard Work;Cleaning;Community Activity    Stability/Clinical Decision Making Evolving/Moderate complexity    Rehab Potential Fair    PT Frequency 2x / week    PT Duration 6 weeks    PT Treatment/Interventions ADLs/Self Care Home Management;Neuromuscular re-education;Balance training;Therapeutic exercise;Therapeutic activities;Functional mobility training;Gait training;Stair training;Patient/family education    PT Next Visit Plan nustep, lower extremity strengthening and balance interventions    Consulted and Agree with Plan of Care Patient;Family member/caregiver    Family Member Consulted wife Joe [name])             Patient will benefit from skilled therapeutic intervention in order to improve the following deficits and impairments:  Abnormal gait, Difficulty walking, Decreased activity tolerance, Decreased balance, Decreased strength, Decreased mobility  Visit Diagnosis: Other abnormalities of gait and mobility  Muscle weakness (generalized)     Problem List Patient Active Problem List   Diagnosis Date Noted   Gait instability 10/09/2021   Exposure to Agent Orange 04/04/2021   Seborrheic dermatitis, unspecified 04/04/2021   Presbycusis of both ears 05/09/2020   Chronic mycotic otitis externa 09/29/2019   Chronic left shoulder pain 08/13/2019   CKD stage 3 due to type 2 diabetes mellitus ([place]) 01/27/2019   Erectile dysfunction due to arterial insufficiency 04/15/2018   Thrombocytopenia ([place]) 03/18/2015   Atopic dermatitis 01/23/2015   Thoracic disc  herniation 06/20/2014   Left varicocele 06/20/2014   Abdominal aortic atherosclerosis ([place]) 06/20/2014   Ventral hernia without obstruction or gangrene 02/04/2014   History of colonic polyps 02/04/2014   Hyperlipidemia associated with type 2 diabetes mellitus ([place]) 12/30/2012   Hypertension associated with diabetes ([place]) 12/30/2012   Benign prostatic hyperplasia 12/30/2012   Type  2 diabetes mellitus with diabetic neuropathy, with long-term current use of insulin ([place]) 09/28/2012   RBBB 04/11/2010   Cardiovascular function study, abnormal 04/11/2010   Gastroesophageal reflux disease without esophagitis 09/14/2007   Nephrolithiasis 09/14/2007   Rationale for Evaluation and Treatment Rehabilitation   Kathrynn Ducking, PTA 11/01/2021, 9:59 AM  [facility] [address] [place], [place], [address] Phone: [phone]   Fax:  [phone]  Name: Joe [name] MRN: [id number] Date of Birth: [birth year]

## 2021-11-05 ENCOUNTER — Ambulatory Visit: Payer: Medicare PPO

## 2021-11-05 DIAGNOSIS — M6281 Muscle weakness (generalized): Secondary | ICD-10-CM

## 2021-11-05 DIAGNOSIS — R2689 Other abnormalities of gait and mobility: Secondary | ICD-10-CM | POA: Diagnosis not present

## 2021-11-05 DIAGNOSIS — M25512 Pain in left shoulder: Secondary | ICD-10-CM | POA: Diagnosis not present

## 2021-11-05 NOTE — Therapy (Signed)
[facility] [place], [place], [address] Phone: [phone]   Fax:  [phone]  Physical Therapy Treatment  Patient Details  Name: Joe [name] MRN: [id number] Date of Birth: 1940/07/01 Referring Provider (PT): Rakes, FNP   Encounter Date: 11/05/2021   PT End of Session - 11/05/21 0951     Visit Number 5    Number of Visits 12    Date for PT Re-Evaluation 12/28/21    PT Start Time 0948    PT Stop Time 1030    PT Time Calculation (min) 42 min    Activity Tolerance Patient tolerated treatment well    Behavior During Therapy [facility] for tasks assessed/performed             Past Medical History:  Diagnosis Date   Adenomatous colon polyp    Arthritis    Cataract    Chest pain at rest 05/26/2016   Diabetes mellitus without complication ([place])    Dyslipidemia    ESOPHAGITIS, REFLUX 07/09/2004   Qualifier: Diagnosis of  By: Hardin Negus CMA Deborra Medina), Stephanie     Hiatal hernia    HOH (hard of hearing)    has bilateral Hearing aids   Hypertension    Irritable bowel syndrome    Kidney stone 1970   Pneumonia    Tick bites    took 5 rounds of Doxycycline this summer    Past Surgical History:  Procedure Laterality Date   COLONOSCOPY     [place]   ESOPHAGEAL DILATION  2003   FLEXIBLE SIGMOIDOSCOPY     HEMICOLECTOMY  08/2005   Right   KNEE SURGERY Right    TONSILLECTOMY      There were no vitals filed for this visit.   Subjective Assessment - 11/05/21 0951     Subjective Patient reports that he feels alright today.    Patient is accompained by: Family member   WIfe   Pertinent History chronic back pain    Limitations Walking    Patient Stated Goals be safer with yardwork and household activities    Currently in Pain? No/denies                               [facility] Adult PT Treatment/Exercise - 11/05/21 0001       Lumbar Exercises: Aerobic   Nustep L3 x 12.5 minutes       Knee/Hip Exercises: Machines for Strengthening   Cybex Knee Flexion 50# x 30 reps      Knee/Hip Exercises: Standing   Heel Raises Both;20 reps;10 reps    Heel Raises Limitations B toe raises x 30 reps    Hip Abduction Both;20 reps;Knee straight    Abduction Limitations green t-band around ankles    Forward Step Up Both;20 reps;Hand Hold: 2;Step Height: 6"    Functional Squat 20 reps;Other (comment)   with tap to elevated table   Other Standing Knee Exercises Marching   2 minutes (1 HHA) on foam; 2 minutes (2 HHA) on BOSU (ball up)                         PT Long Term Goals - 10/18/21 1419       PT LONG TERM GOAL #1   Title Patient will be independent with his HEP.    Time 6    Period Weeks    Status New  Target Date 11/29/21      PT LONG TERM GOAL #2   Title Patient will improve his TUG time to 14 seconds or less for improved safety and to reduce his fall risk.    Time 6    Period Weeks    Status New    Target Date 11/29/21      PT LONG TERM GOAL #3   Title Patient will be able to improve his gait speed to at least 0.8 m/s for improved household and community mobility.    Time 6    Period Weeks    Status New    Target Date 11/29/21                   Plan - 11/05/21 1031     Clinical Impression Statement Patient was progressed with multiple new and familiar interventions for improved lower extremity strength with moderate difficulty. He required cueing throughout treatment for upright stance for improved posture and safety with ambulating. He reported feeling good upon the conclusion of treatment. He continues to require skilled physical therapy to address his remaining impairments to maximize his safety and functional mobility.    Personal Factors and Comorbidities Comorbidity 3+;Time since onset of injury/illness/exacerbation;Age    Comorbidities HTN, DM, neuropathy, CKD,    Examination-Activity Limitations Locomotion  Level;Transfers;Lift;Stand;Stairs;Dressing    Examination-Participation Restrictions Yard Work;Cleaning;Community Activity    Stability/Clinical Decision Making Evolving/Moderate complexity    Rehab Potential Fair    PT Frequency 2x / week    PT Duration 6 weeks    PT Treatment/Interventions ADLs/Self Care Home Management;Neuromuscular re-education;Balance training;Therapeutic exercise;Therapeutic activities;Functional mobility training;Gait training;Stair training;Patient/family education    PT Next Visit Plan nustep, lower extremity strengthening and balance interventions    Consulted and Agree with Plan of Care Patient;Family member/caregiver    Family Member Consulted wife Joe [name])             Patient will benefit from skilled therapeutic intervention in order to improve the following deficits and impairments:  Abnormal gait, Difficulty walking, Decreased activity tolerance, Decreased balance, Decreased strength, Decreased mobility  Visit Diagnosis: Other abnormalities of gait and mobility  Muscle weakness (generalized)     Problem List Patient Active Problem List   Diagnosis Date Noted   Gait instability 10/09/2021   Exposure to Agent [facility] 04/04/2021   Seborrheic dermatitis, unspecified 04/04/2021   Presbycusis of both ears 05/09/2020   Chronic mycotic otitis externa 09/29/2019   Chronic left shoulder pain 08/13/2019   CKD stage 3 due to type 2 diabetes mellitus ([place]) 01/27/2019   Erectile dysfunction due to arterial insufficiency 04/15/2018   Thrombocytopenia ([place]) 03/18/2015   Atopic dermatitis 01/23/2015   Thoracic disc herniation 06/20/2014   Left varicocele 06/20/2014   Abdominal aortic atherosclerosis ([place]) 06/20/2014   Ventral hernia without obstruction or gangrene 02/04/2014   History of colonic polyps 02/04/2014   Hyperlipidemia associated with type 2 diabetes mellitus ([place]) 12/30/2012   Hypertension associated with diabetes ([place]) 12/30/2012   Benign  prostatic hyperplasia 12/30/2012   Type 2 diabetes mellitus with diabetic neuropathy, with long-term current use of insulin ([place]) 09/28/2012   RBBB 04/11/2010   Cardiovascular function study, abnormal 04/11/2010   Gastroesophageal reflux disease without esophagitis 09/14/2007   Nephrolithiasis 09/14/2007    Darlin Coco, PT 11/05/2021, 1:18 PM  [facility] [address] [place], [place], [address] Phone: [phone]   Fax:  [phone]  Name: Joe [name] MRN: [id number] Date of Birth: 07-Jan-1941

## 2021-11-08 ENCOUNTER — Ambulatory Visit: Payer: Medicare PPO | Admitting: *Deleted

## 2021-11-08 DIAGNOSIS — M6281 Muscle weakness (generalized): Secondary | ICD-10-CM

## 2021-11-08 DIAGNOSIS — R2689 Other abnormalities of gait and mobility: Secondary | ICD-10-CM

## 2021-11-08 DIAGNOSIS — M25512 Pain in left shoulder: Secondary | ICD-10-CM | POA: Diagnosis not present

## 2021-11-08 NOTE — Therapy (Signed)
[facility] [place], [place], [address] Phone: [phone]   Fax:  [phone]  Physical Therapy Treatment  Patient Details  Name: Joe [name] MRN: [id number] Date of Birth: 02-28-1941 Referring Provider (PT): Rakes, FNP   Encounter Date: 11/08/2021   PT End of Session - 11/08/21 0959     Visit Number 6    Number of Visits 12    Date for PT Re-Evaluation 12/28/21    PT Start Time 0945    PT Stop Time 6237    PT Time Calculation (min) 50 min             Past Medical History:  Diagnosis Date   Adenomatous colon polyp    Arthritis    Cataract    Chest pain at rest 05/26/2016   Diabetes mellitus without complication ([place])    Dyslipidemia    ESOPHAGITIS, REFLUX 07/09/2004   Qualifier: Diagnosis of  By: Hardin Negus CMA Deborra Medina), Stephanie     Hiatal hernia    HOH (hard of hearing)    has bilateral Hearing aids   Hypertension    Irritable bowel syndrome    Kidney stone 1970   Pneumonia    Tick bites    took 5 rounds of Doxycycline this summer    Past Surgical History:  Procedure Laterality Date   COLONOSCOPY     ESOPHAGEAL DILATION  1996   ESOPHAGEAL DILATION  2003   FLEXIBLE SIGMOIDOSCOPY     HEMICOLECTOMY  08/2005   Right   KNEE SURGERY Right    TONSILLECTOMY      There were no vitals filed for this visit.   Subjective Assessment - 11/08/21 0958     Subjective Patient doing Ok today    Pertinent History chronic back pain    Limitations Walking    Patient Stated Goals be safer with yardwork and household activities    Currently in Pain? No/denies                               [facility] Adult PT Treatment/Exercise - 11/08/21 0001       Lumbar Exercises: Aerobic   Nustep L4 x 72mnutes      Knee/Hip Exercises: Machines for Strengthening   Cybex Knee Extension 20# 3 x fatigue    Cybex Knee Flexion 50# 3 x fatigue      Knee/Hip Exercises: Standing   Heel Raises --    Heel  Raises Limitations --                 Balance Exercises - 11/08/21 0001       Balance Exercises: Standing   Tandem Stance Eyes open;Upper extremity support 1;Foam/compliant surface;4 reps;30 secs    Tandem Stance Limitations Alternating LE    Rockerboard Anterior/posterior;Intermittent UE support   x 5 mins   Sidestepping Foam/compliant support   Intermittent UE support PRN x 5 mins                    PT Long Term Goals - 10/18/21 1419       PT LONG TERM GOAL #1   Title Patient will be independent with his HEP.    Time 6    Period Weeks    Status New    Target Date 11/29/21      PT LONG TERM GOAL #2   Title Patient will improve his TUG time to 14  seconds or less for improved safety and to reduce his fall risk.    Time 6    Period Weeks    Status New    Target Date 11/29/21      PT LONG TERM GOAL #3   Title Patient will be able to improve his gait speed to at least 0.8 m/s for improved household and community mobility.    Time 6    Period Weeks    Status New    Target Date 11/29/21                   Plan - 11/08/21 1002     Clinical Impression Statement Pt arrived today doing fairly well and was able to continue with LE strengthening as well as balance act.'s. Pt with notable improvement with balance exs with less use of UE assist. SBA and CGA still needed during all balance act's.    Personal Factors and Comorbidities Comorbidity 3+;Time since onset of injury/illness/exacerbation;Age    Comorbidities HTN, DM, neuropathy, CKD,    Examination-Activity Limitations Locomotion Level;Transfers;Lift;Stand;Stairs;Dressing    Examination-Participation Restrictions Yard Work;Cleaning;Community Activity    Stability/Clinical Decision Making Evolving/Moderate complexity    Rehab Potential Fair    PT Frequency 2x / week    PT Duration 6 weeks    PT Treatment/Interventions ADLs/Self Care Home Management;Neuromuscular re-education;Balance  training;Therapeutic exercise;Therapeutic activities;Functional mobility training;Gait training;Stair training;Patient/family education    PT Next Visit Plan nustep, lower extremity strengthening and balance interventions    Consulted and Agree with Plan of Care Patient;Family member/caregiver    Family Member Consulted [name])             Patient will benefit from skilled therapeutic intervention in order to improve the following deficits and impairments:  Abnormal gait, Difficulty walking, Decreased activity tolerance, Decreased balance, Decreased strength, Decreased mobility  Visit Diagnosis: Other abnormalities of gait and mobility  Muscle weakness (generalized)     Problem List Patient Active Problem List   Diagnosis Date Noted   Gait instability 10/09/2021   Exposure to Agent Orange 04/04/2021   Seborrheic dermatitis, unspecified 04/04/2021   Presbycusis of both ears 05/09/2020   Chronic mycotic otitis externa 09/29/2019   Chronic left shoulder pain 08/13/2019   CKD stage 3 due to type 2 diabetes mellitus ([place]) 01/27/2019   Erectile dysfunction due to arterial insufficiency 04/15/2018   Thrombocytopenia ([place]) 03/18/2015   Atopic dermatitis 01/23/2015   Thoracic disc herniation 06/20/2014   Left varicocele 06/20/2014   Abdominal aortic atherosclerosis ([place]) 06/20/2014   Ventral hernia without obstruction or gangrene 02/04/2014   History of colonic polyps 02/04/2014   Hyperlipidemia associated with type 2 diabetes mellitus ([place]) 12/30/2012   Hypertension associated with diabetes ([place]) 12/30/2012   Benign prostatic hyperplasia 12/30/2012   Type 2 diabetes mellitus with diabetic neuropathy, with long-term current use of insulin ([place]) 09/28/2012   RBBB 04/11/2010   Cardiovascular function study, abnormal 04/11/2010   Gastroesophageal reflux disease without esophagitis 09/14/2007   Nephrolithiasis 09/14/2007  Rationale for Evaluation and Treatment Rehabilitation    Koleton Duchemin,CHRIS, PTA 11/08/2021, 6:16 PM  [facility] [place], [place], [address] Phone: [phone]   Fax:  [phone]  Name: EDILBERTO ROOSEVELT MRN: [id number] Date of Birth: August 16, 1940

## 2021-11-09 ENCOUNTER — Ambulatory Visit (INDEPENDENT_AMBULATORY_CARE_PROVIDER_SITE_OTHER): Payer: Medicare PPO | Admitting: Pharmacist

## 2021-11-09 DIAGNOSIS — E1140 Type 2 diabetes mellitus with diabetic neuropathy, unspecified: Secondary | ICD-10-CM

## 2021-11-09 DIAGNOSIS — E1169 Type 2 diabetes mellitus with other specified complication: Secondary | ICD-10-CM

## 2021-11-09 NOTE — Patient Instructions (Addendum)
Visit Information  Following are the goals we discussed today:  Current Barriers:  Unable to maintain control of T2DM  Pharmacist Clinical Goal(s):  patient will maintain control of T2DM as evidenced by GOAL A1C<7% AND >70% TIME IN TARGET RANGE  through collaboration with PharmD and provider.    Interventions: 1:1 collaboration with Rakes, Connye Burkitt, FNP regarding development and update of comprehensive plan of care as evidenced by provider attestation and co-signature Inter-disciplinary care team collaboration (see longitudinal plan of care) Comprehensive medication review performed; medication list updated in electronic medical record  Diabetes: Goal on Track (progressing): YES. Uncontrolled;  Current treatment: TRESIBA 16-20 units, JANUVIA '100mg'$ , METFORMIN, JARDIANCE '25mg'$ ;  Added low dose Humalog 5 units before lunch (starting low to see patient response; nervous about rapid acting insulin) A1c decreased to 8.8-->8.3% Patient does not wish to try a GLP1 (he does not want additional weight loss) Would like to try low dose trulicity at some point (in place of [place]) Current glucose readings: fasting glucose: usually <130, post prandial glucose: most within range, but can get in the 200s depending on diet; has been on prednisone for back pain Current meal patterns: patient/wife track food diary; he is eating a balanced diet for the most part (indulges in treats every now & then) Current exercise: OCCASIONAL  Recommended adding lunch time humalog    Patient Goals/Self-Care Activities patient will:  - take medications as prescribed as evidenced by patient report and record review check glucose USING LIBRE 2 CGM, document, and provide at future appointments target a minimum of 150 minutes of moderate intensity exercise weekly engage in dietary modifications by FOLLOWING A HEART HEALTHY DIET/HEALTHY PLATE METHOD    Plan: Face to Face appointment with care management team member  scheduled for: 02/08/22  Signature Regina Eck, PharmD, BCPS Clinical Pharmacist, [place]  II Phone [phone]   Please call the care guide team at [phone] if you need to cancel or reschedule your appointment.   The patient verbalized understanding of instructions, educational materials, and care plan provided today and DECLINED offer to receive copy of patient instructions, educational materials, and care plan.

## 2021-11-09 NOTE — Progress Notes (Signed)
Chronic Care Management Pharmacy Note  11/09/2021 Name:  Joe [name] MRN:  [id number] DOB:  10-26-1940  Summary:  Diabetes: Goal on Track (progressing): YES. Uncontrolled;  Current treatment: TRESIBA 16-20 units, JANUVIA 120m, METFORMIN, JARDIANCE 246m  Added low dose Humalog 5 units before lunch (starting low to see patient response; nervous about rapid acting insulin) A1c decreased to 8.8-->8.3% Patient does not wish to try a GLP1 (he does not want additional weight loss) Would like to try low dose trulicity at some point (in place of ja[place]ongaCurrent glucose readings: fasting glucose: usually <130, post prandial glucose: most within range, but can get in the 200s depending on diet; has been on prednisone for back pain Current meal patterns: patient/wife track food diary; he is eating a balanced diet for the most part (indulges in treats every now & then) Current exercise: OCCASIONAL  Recommended adding lunch time humalog     Subjective: Joe SIEBERTs an [age].o. year old male who is a primary patient of Rakes, LiConnye BurkittFNP.  The CCM team was consulted for assistance with disease management and care coordination needs.    Engaged with patient face to face for follow up visit in response to provider referral for pharmacy case management and/or care coordination services.   Consent to Services:  The patient was given information about Chronic Care Management services, agreed to services, and gave verbal consent prior to initiation of services.  Please see initial visit note for detailed documentation.   Patient Care Team: RaBaruch GoutyFNP as PCP - General (Family Medicine) WrIrine SealMD (Urology) GeGatha MayerMD (Gastroenterology) HoMinus BreedingMD as Consulting Physician (Cardiology) PrLavera GuiseRPHosp Upr CarolinaPharmacist) LeHarlen LabsMD as Referring Physician (Optometry) PrLavera GuiseRPOrthopaedic [facility] Pharmacist (Family Medicine)  Objective:  Lab Results   Component Value Date   CREATININE 1.23 07/12/2021   CREATININE 1.31 (H) 01/05/2021   CREATININE 1.22 09/26/2020    Lab Results  Component Value Date   HGBA1C 8.5 (H) 10/09/2021   Last diabetic Eye exam:  Lab Results  Component Value Date/Time   HMDIABEYEEXA No Retinopathy 08/03/2018 12:00 AM    Last diabetic Foot exam: No results found for: "HMDIABFOOTEX"      Component Value Date/Time   CHOL 122 07/12/2021 1011   CHOL 105 12/24/2012 0850   TRIG 173 (H) 07/12/2021 1011   TRIG 207 (H) 10/29/2016 1100   TRIG 46 12/24/2012 0850   HDL 43 07/12/2021 1011   HDL 34 (L) 10/29/2016 1100   HDL 44 12/24/2012 0850   CHOLHDL 2.8 07/12/2021 1011   CHOLHDL 2.8 05/26/2016 0349   VLDL 20 05/26/2016 0349   LDLCALC 50 07/12/2021 1011   LDLCALC 59 02/03/2014 0840   LDLCALC 52 12/24/2012 0850       Latest Ref Rng & Units 07/12/2021   10:11 AM 01/05/2021    1:37 PM 09/26/2020   11:07 AM  Hepatic Function  Total Protein 6.0 - 8.5 g/dL 6.1  6.1    Albumin 3.7 - 4.7 g/dL 4.3  4.0  4.5   AST 0 - 40 IU/L 17  15    ALT 0 - 44 IU/L 16  12    Alk Phosphatase 44 - 121 IU/L 59  56    Total Bilirubin 0.0 - 1.2 mg/dL 0.5  0.3      Lab Results  Component Value Date/Time   TSH 0.918 03/07/2017 12:12 PM  Latest Ref Rng & Units 07/12/2021   10:11 AM 04/10/2021   12:11 PM 01/05/2021    1:37 PM  CBC  WBC 3.4 - 10.8 x10E3/uL 5.4  6.0  5.1   Hemoglobin 13.0 - 17.7 g/dL 11.4  11.7  11.7   Hematocrit 37.5 - 51.0 % 33.8  34.5  34.3   Platelets 150 - 450 x10E3/uL 142  126  122     Lab Results  Component Value Date/Time   VD25OH 54.3 10/02/2018 10:19 AM   VD25OH 46.8 04/01/2018 11:11 AM    Clinical ASCVD: No  The ASCVD Risk score (Arnett DK, et al., 2019) failed to calculate for the following reasons:   The 2019 ASCVD risk score is only valid for ages 38 to 65    Other: (CHADS2VASc if Afib, PHQ9 if depression, MMRC or CAT for COPD, ACT, DEXA)  Social History   Tobacco Use  Smoking  Status Former   Packs/day: 2.00   Types: Cigarettes   Start date: 06/03/1954   Quit date: 08/24/1984   Years since quitting: 37.2  Smokeless Tobacco Never  Tobacco Comments   Has not used to tobacco products for the past 20 years   BP Readings from Last 3 Encounters:  11/16/21 136/66  10/17/21 128/67  10/09/21 118/61   Pulse Readings from Last 3 Encounters:  11/16/21 (!) 57  10/17/21 64  10/09/21 (!) 55   Wt Readings from Last 3 Encounters:  11/16/21 168 lb (76.2 kg)  10/17/21 164 lb (74.4 kg)  10/09/21 163 lb (73.9 kg)    Assessment: Review of patient past medical history, allergies, medications, health status, including review of consultants reports, laboratory and other test data, was performed as part of comprehensive evaluation and provision of chronic care management services.   SDOH:  (Social Determinants of Health) assessments and interventions performed:    CCM Care Plan  Allergies  Allergen Reactions   Ace Inhibitors Cough   Trazamine [Trazodone & Diet Manage Prod] Other (See Comments)    nightmares    Medications Reviewed Today     Reviewed by Baruch Gouty, FNP (Family Nurse Practitioner) on 11/16/21 at [phone]  Med List Status: <None>   Medication Order Taking? Sig Documenting Provider Last Dose Status Informant  Accu-Chek Softclix Lancets lancets [id number] Yes CHECK BLOOD SUGAR 4 TIMES A DAY OR AS DIRECTED [provider] Taking Active   aspirin 81 MG tablet [id number] Yes Take 81 mg by mouth daily. [provider] Taking Active Spouse/Significant Other  Blood Glucose Monitoring Suppl w/Device KIT [id number] Yes Test BS BID and PRN dx E11.9. Give test strips and lancets to match machine given Chipper Herb, MD Taking Active   cholecalciferol (VITAMIN D) 1000 UNITS tablet [id number] Yes Take 2,000 Units by mouth daily. [provider] Taking Active Spouse/Significant Other  Cinnamon 500 MG TABS [id number] Yes Take 1,000 mg by mouth daily.  [provider] Taking Active Spouse/Significant Other  Continuous Blood Gluc Receiver (FREESTYLE LIBRE 2 READER) DEVI [id number] Yes Use to test blood sugars up to 6 times daily as directed. DX: E11.9 Janora Norlander, DO Taking Active   Continuous Blood Gluc Sensor (FREESTYLE LIBRE 2 SENSOR) MISC [id number] Yes USE AS DIRECTED UP TO SIX TIMES DAILY. CHANGE EVERY 14 DAYS Rakes, Connye Burkitt, FNP Taking Active   diclofenac Sodium (VOLTAREN) 1 % GEL [id number] Yes Apply 2 g topically 4 (four) times daily. Ivy Lynn, NP Taking Active   diphenhydrAMINE (BENADRYL) 25 MG tablet  [id number] Yes Take 25 mg by mouth every 6 (six) hours as needed. [provider] Taking Active   doxylamine, Sleep, (UNISOM) 25 MG tablet [id number] Yes Take 25 mg by mouth at bedtime as needed for sleep.  [provider] Taking Active Spouse/Significant Other  empagliflozin (JARDIANCE) 25 MG TABS tablet [id number] Yes Take 1 tablet (25 mg total) by mouth in the morning. Baruch Gouty, FNP Taking Active   esomeprazole (NEXIUM) 40 MG capsule [id number] Yes TAKE (1) CAPSULE DAILY Ronnie Doss M, DO Taking Active   glucose blood (ONETOUCH ULTRA) test strip [id number] Yes Check blood sugar 4 times daily Dx E 11.40 Rakes, Connye Burkitt, FNP Taking Active   Insulin Pen Needle (PEN NEEDLES) 32G X 5 MM MISC [id number] Yes 1 Device by Does not apply route daily. Janora Norlander, DO Taking Active   JANUVIA 100 MG tablet [id number] Yes TAKE 1 TABLET DAILY Baruch Gouty, FNP Taking Active   MELATONIN PO [id number] Yes Take by mouth. [provider] Taking Active   metFORMIN (GLUCOPHAGE-XR) 500 MG 24 hr tablet [id number] Yes TAKE 2 TABLETS 2 TIMES A DAY WITH MEALS Rakes, Connye Burkitt, FNP Taking Active   mupirocin ointment (BACTROBAN) 2 % [id number] Yes Apply 1 application topically 2 (two) times daily. Timmothy Euler, MD Taking Active   naproxen (NAPROSYN) 500 MG tablet [id number] Yes Take 1 tablet (500 mg total)  by mouth 2 (two) times daily with a meal for 14 days. Baruch Gouty, FNP  Active   olmesartan (BENICAR) 20 MG tablet [id number] Yes TAKE 1 TABLET ONCE DAILY Rakes, Connye Burkitt, FNP Taking Active   Probiotic Product (PROBIOTIC PO) [id number] Yes Take 1 tablet by mouth daily. [provider] Taking Active Spouse/Significant Other  simvastatin (ZOCOR) 40 MG tablet [id number] Yes TAKE 1 TABLET ONCE DAILY IN THE EVENING Rakes, Connye Burkitt, FNP Taking Active   tamsulosin (FLOMAX) 0.4 MG CAPS capsule [id number] Yes Take 1 capsule (0.4 mg total) by mouth at bedtime. Baruch Gouty, FNP Taking Active   TRESIBA FLEXTOUCH 100 UNIT/ML FlexTouch Pen [id number] Yes Inject 0.06-0.2 mLs (6-20 Units total) into the skin daily at 10 pm. Baruch Gouty, FNP Taking Active   triamcinolone ointment (KENALOG) 0.5 % [id number] Yes APPLY TO AFFECTED AREAS TWICE A DAY Baruch Gouty, FNP Taking Active             Patient Active Problem List   Diagnosis Date Noted   Gait instability 10/09/2021   Exposure to Agent [facility] 04/04/2021   Seborrheic dermatitis, unspecified 04/04/2021   Presbycusis of both ears 05/09/2020   Chronic mycotic otitis externa 09/29/2019   Chronic left shoulder pain 08/13/2019   CKD stage 3 due to type 2 diabetes mellitus ([place]) 01/27/2019   Erectile dysfunction due to arterial insufficiency 04/15/2018   Thrombocytopenia ([place]) 03/18/2015   Atopic dermatitis 01/23/2015   Thoracic disc herniation 06/20/2014   Left varicocele 06/20/2014   Abdominal aortic atherosclerosis ([place]) 06/20/2014   Ventral hernia without obstruction or gangrene 02/04/2014   History of colonic polyps 02/04/2014   Hyperlipidemia associated with type 2 diabetes mellitus ([place]) 12/30/2012   Hypertension associated with diabetes ([place]) 12/30/2012   Benign prostatic hyperplasia 12/30/2012   Type 2 diabetes mellitus with diabetic neuropathy, with long-term current use of insulin ([place]) 09/28/2012   RBBB 04/11/2010    Cardiovascular function study, abnormal 04/11/2010   Gastroesophageal reflux disease without esophagitis 09/14/2007   Nephrolithiasis 09/14/2007    Immunization History  Administered Date(s)  Administered   Fluad Quad(high Dose 65+) 03/16/2019, 04/17/2020, 03/23/2021   Influenza, High Dose Seasonal PF 03/28/2016, 03/07/2017, 04/01/2018   Influenza,inj,Quad PF,6+ Mos 03/23/2013, 03/26/2014, 03/17/2015   Influenza-Unspecified 03/28/2016, 03/07/2017, 04/01/2018, 03/03/2021   Moderna Covid-19 Vaccine Bivalent Booster 41yr & up 03/03/2021, 03/14/2021   Moderna Sars-Covid-2 Vaccination 06/21/2019, 07/19/2019, 03/28/2020, 10/04/2020   Pneumococcal Conjugate-13 06/03/2013, 06/08/2013   Pneumococcal Polysaccharide-23 04/03/2008   Tdap 03/13/2011, 12/26/2018    Conditions to be addressed/monitored: DMII  Care Plan : PHARMD MEDICATION MANAGEMENT  Updates made by PLavera Guise RCoal Grovesince 11/16/2021 12:00 AM     Problem: DISEASE PROGRESSION PREVENTION      Long-Range Goal: T2DM PHARMD GOAL   Recent Progress: Not on track  Note:   Current Barriers:  Unable to maintain control of T2DM  Pharmacist Clinical Goal(s):  patient will maintain control of T2DM as evidenced by GOAL A1C<7% AND >70% TIME IN TARGET RANGE  through collaboration with PharmD and provider.    Interventions: 1:1 collaboration with Rakes, LConnye Burkitt FNP regarding development and update of comprehensive plan of care as evidenced by provider attestation and co-signature Inter-disciplinary care team collaboration (see longitudinal plan of care) Comprehensive medication review performed; medication list updated in electronic medical record  Diabetes: Goal on Track (progressing): YES. Uncontrolled;  Current treatment: TRESIBA 16-20 units, JANUVIA 1058m METFORMIN, JARDIANCE 2532m Added low dose Humalog 5 units before lunch (starting low to see patient response; nervous about rapid acting insulin) A1c decreased to  8.8-->8.3% Patient does not wish to try a GLP1 (he does not want additional weight loss) Would like to try low dose trulicity at some point (in place of janTongaurrent glucose readings: fasting glucose: usually <130, post prandial glucose: most within range, but can get in the 200s depending on diet; has been on prednisone for back pain Current meal patterns: patient/wife track food diary; he is eating a balanced diet for the most part (indulges in treats every now & then) Current exercise: OCCASIONAL  Recommended adding lunch time humalog   Patient Goals/Self-Care Activities patient will:  - take medications as prescribed as evidenced by patient report and record review check glucose USING LIBRE 2 CGM, document, and provide at future appointments target a minimum of 150 minutes of moderate intensity exercise weekly engage in dietary modifications by   FOLLOWING A HEART HEALTHY DIET/HEALTHY PLATE METHOD     Follow Up:  Patient agrees to Care Plan and Follow-up.  Plan: Face to Face appointment with care management team member scheduled for: 2 months   JulRegina EckharmD, BCPS Clinical Pharmacist, [place] Phone [phone]

## 2021-11-13 ENCOUNTER — Ambulatory Visit: Payer: Medicare PPO

## 2021-11-13 DIAGNOSIS — M6281 Muscle weakness (generalized): Secondary | ICD-10-CM | POA: Diagnosis not present

## 2021-11-13 DIAGNOSIS — R2689 Other abnormalities of gait and mobility: Secondary | ICD-10-CM

## 2021-11-13 DIAGNOSIS — M25512 Pain in left shoulder: Secondary | ICD-10-CM | POA: Diagnosis not present

## 2021-11-13 NOTE — Therapy (Signed)
[facility] [place], [place], [address] Phone: [phone]   Fax:  [phone]  Physical Therapy Treatment  Patient Details  Name: Joe [name] MRN: [id number] Date of Birth: [birth year] Referring Provider (PT): Rakes, FNP   Encounter Date: 11/13/2021   PT End of Session - 11/13/21 0952     Visit Number 7    Number of Visits 12    Date for PT Re-Evaluation 12/28/21    PT Start Time 0950    PT Stop Time 1038    PT Time Calculation (min) 48 min             Past Medical History:  Diagnosis Date   Adenomatous colon polyp    Arthritis    Cataract    Chest pain at rest 05/26/2016   Diabetes mellitus without complication ([place])    Dyslipidemia    ESOPHAGITIS, REFLUX 07/09/2004   Qualifier: Diagnosis of  By: Hardin Negus CMA Deborra Medina), Stephanie     Hiatal hernia    HOH (hard of hearing)    has bilateral Hearing aids   Hypertension    Irritable bowel syndrome    Kidney stone 1970   Pneumonia    Tick bites    took 5 rounds of Doxycycline this summer    Past Surgical History:  Procedure Laterality Date   COLONOSCOPY     ESOPHAGEAL DILATION  1996   ESOPHAGEAL DILATION  2003   FLEXIBLE SIGMOIDOSCOPY     HEMICOLECTOMY  08/2005   Right   KNEE SURGERY Right    TONSILLECTOMY      There were no vitals filed for this visit.   Subjective Assessment - 11/13/21 0950     Subjective Pt arrives for today's treatment session dneying any pain. Pt's wife states that he has been alternating performing HEP and riding the elliptical which is going well.    Pertinent History chronic back pain    Limitations Walking    Patient Stated Goals be safer with yardwork and household activities    Currently in Pain? No/denies                               [facility] Adult PT Treatment/Exercise - 11/13/21 0001       Lumbar Exercises: Aerobic   Recumbent Bike Lvl 4 x 20 mins      Knee/Hip Exercises: Machines for  Strengthening   Cybex Knee Extension 20# 3 x fatigue    Cybex Knee Flexion 50# 3 x fatigue                 Balance Exercises - 11/13/21 0001       Balance Exercises: Standing   Tandem Stance Eyes open;Foam/compliant surface;Intermittent upper extremity support;4 reps   60 secs   Tandem Stance Limitations Alternating LE    Rockerboard Anterior/posterior;Intermittent UE support   5 mins                    PT Long Term Goals - 10/18/21 1419       PT LONG TERM GOAL #1   Title Patient will be independent with his HEP.    Time 6    Period Weeks    Status New    Target Date 11/29/21      PT LONG TERM GOAL #2   Title Patient will improve his TUG time to 14 seconds or less for improved safety and  to reduce his fall risk.    Time 6    Period Weeks    Status New    Target Date 11/29/21      PT LONG TERM GOAL #3   Title Patient will be able to improve his gait speed to at least 0.8 m/s for improved household and community mobility.    Time 6    Period Weeks    Status New    Target Date 11/29/21                   Plan - 11/13/21 7564     Clinical Impression Statement Pt arrives for today's treatment session denying any pain.  Pt able to tolerate increased resistance on recumbent bike today without issue.  Pt challenged by tandem stance on Airex pad with marked unsteadiness.  Pt requiring intermittent UE support during rockerboard exercises.  Pt requiring SBA/CGA with all balance activities.  Pt reported increased fatigue at completion of today's treatment session, but denies any pain.    Personal Factors and Comorbidities Comorbidity 3+;Time since onset of injury/illness/exacerbation;Age    Comorbidities HTN, DM, neuropathy, CKD,    Examination-Activity Limitations Locomotion Level;Transfers;Lift;Stand;Stairs;Dressing    Examination-Participation Restrictions Yard Work;Cleaning;Community Activity    Stability/Clinical Decision Making Evolving/Moderate  complexity    Rehab Potential Fair    PT Frequency 2x / week    PT Duration 6 weeks    PT Treatment/Interventions ADLs/Self Care Home Management;Neuromuscular re-education;Balance training;Therapeutic exercise;Therapeutic activities;Functional mobility training;Gait training;Stair training;Patient/family education    PT Next Visit Plan nustep, lower extremity strengthening and balance interventions    Consulted and Agree with Plan of Care Patient;Family member/caregiver    Family Member Consulted [name])             Patient will benefit from skilled therapeutic intervention in order to improve the following deficits and impairments:  Abnormal gait, Difficulty walking, Decreased activity tolerance, Decreased balance, Decreased strength, Decreased mobility  Visit Diagnosis: Other abnormalities of gait and mobility  Muscle weakness (generalized)     Problem List Patient Active Problem List   Diagnosis Date Noted   Gait instability 10/09/2021   Exposure to Agent [facility] 04/04/2021   Seborrheic dermatitis, unspecified 04/04/2021   Presbycusis of both ears 05/09/2020   Chronic mycotic otitis externa 09/29/2019   Chronic left shoulder pain 08/13/2019   CKD stage 3 due to type 2 diabetes mellitus ([place]) 01/27/2019   Erectile dysfunction due to arterial insufficiency 04/15/2018   Thrombocytopenia ([place]) 03/18/2015   Atopic dermatitis 01/23/2015   Thoracic disc herniation 06/20/2014   Left varicocele 06/20/2014   Abdominal aortic atherosclerosis ([place]) 06/20/2014   Ventral hernia without obstruction or gangrene 02/04/2014   History of colonic polyps 02/04/2014   Hyperlipidemia associated with type 2 diabetes mellitus ([place]) 12/30/2012   Hypertension associated with diabetes ([place]) 12/30/2012   Benign prostatic hyperplasia 12/30/2012   Type 2 diabetes mellitus with diabetic neuropathy, with long-term current use of insulin ([place]) 09/28/2012   RBBB 04/11/2010   Cardiovascular  function study, abnormal 04/11/2010   Gastroesophageal reflux disease without esophagitis 09/14/2007   Nephrolithiasis 09/14/2007   Rationale for Evaluation and Treatment Rehabilitation  Kathrynn Ducking, PTA 11/13/2021, 10:49 AM  [facility] [address] [place], [place], [address] Phone: [phone]   Fax:  [phone]  Name: Joe [name] MRN: [id number] Date of Birth: November 06, 1940

## 2021-11-15 ENCOUNTER — Ambulatory Visit: Payer: Medicare PPO

## 2021-11-15 DIAGNOSIS — M6281 Muscle weakness (generalized): Secondary | ICD-10-CM

## 2021-11-15 DIAGNOSIS — R2689 Other abnormalities of gait and mobility: Secondary | ICD-10-CM

## 2021-11-15 DIAGNOSIS — M25512 Pain in left shoulder: Secondary | ICD-10-CM | POA: Diagnosis not present

## 2021-11-15 NOTE — Therapy (Signed)
[facility] [place], [place], [address] Phone: [phone]   Fax:  [phone]  Physical Therapy Treatment  Patient Details  Name: Joe [name] MRN: [id number] Date of Birth: 1940-07-30 Referring Provider (PT): Rakes, FNP   Encounter Date: 11/15/2021   PT End of Session - 11/15/21 0955     Visit Number 8    Number of Visits 12    Date for PT Re-Evaluation 12/28/21    PT Start Time 0947    PT Stop Time 1030    PT Time Calculation (min) 43 min             Past Medical History:  Diagnosis Date   Adenomatous colon polyp    Arthritis    Cataract    Chest pain at rest 05/26/2016   Diabetes mellitus without complication ([place])    Dyslipidemia    ESOPHAGITIS, REFLUX 07/09/2004   Qualifier: Diagnosis of  By: Hardin Negus CMA Deborra Medina), Stephanie     Hiatal hernia    HOH (hard of hearing)    has bilateral Hearing aids   Hypertension    Irritable bowel syndrome    Kidney stone 1970   Pneumonia    Tick bites    took 5 rounds of Doxycycline this summer    Past Surgical History:  Procedure Laterality Date   COLONOSCOPY     ESOPHAGEAL DILATION  1996   ESOPHAGEAL DILATION  2003   FLEXIBLE SIGMOIDOSCOPY     HEMICOLECTOMY  08/2005   Right   KNEE SURGERY Right    TONSILLECTOMY      There were no vitals filed for this visit.   Subjective Assessment - 11/15/21 0954     Subjective Pt arrives for today's treatment session reporting 8/10 left shoulder pain.  Pt unable to identify cause of pain.  Pt encouraged to call MD tomorrow if pain was not better.    Pertinent History chronic back pain    Limitations Walking    Patient Stated Goals be safer with yardwork and household activities    Currently in Pain? Yes    Pain Score 8     Pain Location Shoulder    Pain Orientation Left                               [facility] Adult PT Treatment/Exercise - 11/15/21 0001       Lumbar Exercises: Aerobic    Recumbent Bike Lvl 5 x 20 mins      Knee/Hip Exercises: Machines for Strengthening   Cybex Knee Extension 30# 3 sets of 10    Cybex Knee Flexion 60# x 30 reps    Cybex Leg Press 3 plates x 30 reps                 Balance Exercises - 11/15/21 0001       Balance Exercises: Standing   Step Ups Forward;UE support 2   BOSU (ball up); 20 reps: Bil   Other Standing Exercises BOSU (ball down) x 5 mins                     PT Long Term Goals - 10/18/21 1419       PT LONG TERM GOAL #1   Title Patient will be independent with his HEP.    Time 6    Period Weeks    Status New    Target Date  11/29/21      PT LONG TERM GOAL #2   Title Patient will improve his TUG time to 14 seconds or less for improved safety and to reduce his fall risk.    Time 6    Period Weeks    Status New    Target Date 11/29/21      PT LONG TERM GOAL #3   Title Patient will be able to improve his gait speed to at least 0.8 m/s for improved household and community mobility.    Time 6    Period Weeks    Status New    Target Date 11/29/21                   Plan - 11/15/21 0955     Clinical Impression Statement Pt arrives for today's treatment session reporting 8/10 left shoulder pain.  Pt unable to identify cause of increased pain, but encouraged to call MD tomorrow if pain does not subside.  Pt able to tolerate increased weight on all cybex machinery today with increased fatigue, but no pain.  Pt introduced to BOSU for balance activities today with good result.  Pt challenged most by ball side down activities.  Pt denied any increase in pain at completion of today's treamtent session.    Personal Factors and Comorbidities Comorbidity 3+;Time since onset of injury/illness/exacerbation;Age    Comorbidities HTN, DM, neuropathy, CKD,    Examination-Activity Limitations Locomotion Level;Transfers;Lift;Stand;Stairs;Dressing    Examination-Participation Restrictions Yard  Work;Cleaning;Community Activity    Stability/Clinical Decision Making Evolving/Moderate complexity    Rehab Potential Fair    PT Frequency 2x / week    PT Duration 6 weeks    PT Treatment/Interventions ADLs/Self Care Home Management;Neuromuscular re-education;Balance training;Therapeutic exercise;Therapeutic activities;Functional mobility training;Gait training;Stair training;Patient/family education    PT Next Visit Plan nustep, lower extremity strengthening and balance interventions    Consulted and Agree with Plan of Care Patient;Family member/caregiver    Family Member Consulted [name])             Patient will benefit from skilled therapeutic intervention in order to improve the following deficits and impairments:  Abnormal gait, Difficulty walking, Decreased activity tolerance, Decreased balance, Decreased strength, Decreased mobility  Visit Diagnosis: Other abnormalities of gait and mobility  Muscle weakness (generalized)     Problem List Patient Active Problem List   Diagnosis Date Noted   Gait instability 10/09/2021   Exposure to Agent Orange 04/04/2021   Seborrheic dermatitis, unspecified 04/04/2021   Presbycusis of both ears 05/09/2020   Chronic mycotic otitis externa 09/29/2019   Chronic left shoulder pain 08/13/2019   CKD stage 3 due to type 2 diabetes mellitus ([place]) 01/27/2019   Erectile dysfunction due to arterial insufficiency 04/15/2018   Thrombocytopenia ([place]) 03/18/2015   Atopic dermatitis 01/23/2015   Thoracic disc herniation 06/20/2014   Left varicocele 06/20/2014   Abdominal aortic atherosclerosis ([place]) 06/20/2014   Ventral hernia without obstruction or gangrene 02/04/2014   History of colonic polyps 02/04/2014   Hyperlipidemia associated with type 2 diabetes mellitus ([place]) 12/30/2012   Hypertension associated with diabetes ([place]) 12/30/2012   Benign prostatic hyperplasia 12/30/2012   Type 2 diabetes mellitus with diabetic neuropathy, with  long-term current use of insulin ([place]) 09/28/2012   RBBB 04/11/2010   Cardiovascular function study, abnormal 04/11/2010   Gastroesophageal reflux disease without esophagitis 09/14/2007   Nephrolithiasis 09/14/2007   Rationale for Evaluation and Treatment Rehabilitation  Kathrynn Ducking, PTA 11/15/2021, 10:42 AM  [facility] [place], [place], [address] Phone: [phone]   Fax:  [phone]  Name: Joe [name] MRN: [id number] Date of Birth: 1940-06-29

## 2021-11-16 ENCOUNTER — Encounter: Payer: Self-pay | Admitting: Family Medicine

## 2021-11-16 ENCOUNTER — Ambulatory Visit: Payer: Medicare PPO

## 2021-11-16 ENCOUNTER — Ambulatory Visit (INDEPENDENT_AMBULATORY_CARE_PROVIDER_SITE_OTHER): Payer: Medicare PPO

## 2021-11-16 ENCOUNTER — Ambulatory Visit: Payer: Medicare PPO | Admitting: Family Medicine

## 2021-11-16 VITALS — BP 136/66 | HR 57 | Temp 97.9°F | Ht 69.0 in | Wt 168.0 lb

## 2021-11-16 DIAGNOSIS — M25512 Pain in left shoulder: Secondary | ICD-10-CM

## 2021-11-16 DIAGNOSIS — M7542 Impingement syndrome of left shoulder: Secondary | ICD-10-CM | POA: Diagnosis not present

## 2021-11-16 MED ORDER — NAPROXEN 500 MG PO TABS: 500.0000 mg | ORAL_TABLET | Freq: Two times a day (BID) | ORAL | 0 refills | Status: AC

## 2021-11-16 NOTE — Progress Notes (Signed)
Subjective:  [name], male    DOB: [birth year], 81 y.o.   MRN: [id number]  Patient Care Team: Baruch Gouty, FNP as PCP - General (Family Medicine) Irine Seal, MD (Urology) Gatha Mayer, MD (Gastroenterology) Minus Breeding, MD as Consulting Physician (Cardiology) Lavera Guise, [facility] (Pharmacist) Harlen Labs, MD as Referring Physician (Optometry) Blanca Friend Royce Macadamia, [facility] as Pharmacist (Family Medicine)   Chief Complaint:  Shoulder Pain   HPI: Joe [name] is a 81 y.o. male presenting on 11/16/2021 for Shoulder Pain   Pt presents today for left shoulder pain since Wednesday. He was moving some heavy objects and doing yard work on Wednesday, states that night the pain was significant and he could not sleep. He did not fall and does not recall a specific injury.   Shoulder Pain  The pain is present in the left shoulder. This is a new problem. The current episode started in the past 7 days. The problem occurs constantly. The problem has been waxing and waning. The quality of the pain is described as aching and sharp. The pain is moderate. Associated symptoms include a limited range of motion and stiffness. Pertinent negatives include no fever, inability to bear weight, itching, joint locking, joint swelling, numbness or tingling. The symptoms are aggravated by activity and lying down. He has tried heat and acetaminophen for the symptoms. The treatment provided no relief.     Relevant past medical, surgical, family, and social history reviewed and updated as indicated.  Allergies and medications reviewed and updated. Data reviewed: Chart in Epic.   Past Medical History:  Diagnosis Date   Adenomatous colon polyp    Arthritis    Cataract    Chest pain at rest 05/26/2016   Diabetes mellitus without complication ([place])    Dyslipidemia    ESOPHAGITIS, REFLUX 07/09/2004   Qualifier: Diagnosis of  By: Hardin Negus CMA Deborra Medina), Stephanie     Hiatal hernia    HOH  (hard of hearing)    has bilateral Hearing aids   Hypertension    Irritable bowel syndrome    Kidney stone 1970   Pneumonia    Tick bites    took 5 rounds of Doxycycline this summer    Past Surgical History:  Procedure Laterality Date   COLONOSCOPY     ESOPHAGEAL DILATION  1996   ESOPHAGEAL DILATION  2003   FLEXIBLE SIGMOIDOSCOPY     HEMICOLECTOMY  08/2005   Right   KNEE SURGERY Right    TONSILLECTOMY      Social History   Socioeconomic History   Marital status: Married    Spouse name: Joe [name]   Number of children: 1   Years of education: 16   Highest education level: Bachelor's degree (e.g., BA, AB, BS)  Occupational History   Occupation: Retired Pharmacist, hospital  Tobacco Use   Smoking status: Former    Packs/day: 2.00    Types: Cigarettes    Start date: 06/03/1954    Quit date: 08/24/1984    Years since quitting: 37.2   Smokeless tobacco: Never   Tobacco comments:    Has not used to tobacco products for the past 20 years  Vaping Use   Vaping Use: Never used  Substance and Sexual Activity   Alcohol use: Yes    Alcohol/week: 7.0 standard drinks of alcohol    Types: 7 Standard drinks or equivalent per week    Comment: [place] at night when he sits on  the deck   Drug use: No   Sexual activity: Yes    Partners: Female  Other Topics Concern   Not on file  Social History Narrative   Lives in [place] with wife, both retired teachers   1 son   3 caffeinated beverages daily   1 alcoholic beverage daily   Former smoker no current tobacco no drug use   Social Determinants of Radio broadcast assistant Strain: Low Risk  (04/04/2021)   Overall Financial Resource Strain (CARDIA)    Difficulty of Paying Living Expenses: Not hard at all  Food Insecurity: No Food Insecurity (04/04/2021)   Hunger Vital Sign    Worried About Running Out of Food in the Last Year: Never true    [place] in the Last Year: Never true  Transportation Needs: No Transportation Needs  (04/04/2021)   PRAPARE - Hydrologist (Medical): No    Lack of Transportation (Non-Medical): No  Physical Activity: Insufficiently Active (04/04/2021)   Exercise Vital Sign    Days of Exercise per Week: 3 days    Minutes of Exercise per Session: 20 min  Stress: No Stress Concern Present (04/04/2021)   [place]    Feeling of Stress : Not at all  Social Connections: Moderately Isolated (04/04/2021)   Social Connection and Isolation Panel [NHANES]    Frequency of Communication with Friends and Family: More than three times a week    Frequency of Social Gatherings with Friends and Family: Once a week    Attends Religious Services: Never    Marine scientist or Organizations: No    Attends Archivist Meetings: Never    Marital Status: Married  Human resources officer Violence: Not At Risk (04/04/2021)   Humiliation, Afraid, Rape, and Kick questionnaire    Fear of Current or Ex-Partner: No    Emotionally Abused: No    Physically Abused: No    Sexually Abused: No    Outpatient Encounter Medications as of 11/16/2021  Medication Sig   Accu-Chek Softclix Lancets lancets CHECK BLOOD SUGAR 4 TIMES A DAY OR AS DIRECTED   aspirin 81 MG tablet Take 81 mg by mouth daily.   Blood Glucose Monitoring Suppl w/Device KIT Test BS BID and PRN dx E11.9. Give test strips and lancets to match machine given   cholecalciferol (VITAMIN D) 1000 UNITS tablet Take 2,000 Units by mouth daily.   Cinnamon 500 MG TABS Take 1,000 mg by mouth daily.   Continuous Blood Gluc Receiver (FREESTYLE LIBRE 2 READER) DEVI Use to test blood sugars up to 6 times daily as directed. DX: E11.9   Continuous Blood Gluc Sensor (FREESTYLE LIBRE 2 SENSOR) MISC USE AS DIRECTED UP TO SIX TIMES DAILY. CHANGE EVERY 14 DAYS   diclofenac Sodium (VOLTAREN) 1 % GEL Apply 2 g topically 4 (four) times daily.   diphenhydrAMINE (BENADRYL) 25 MG tablet  Take 25 mg by mouth every 6 (six) hours as needed.   doxylamine, Sleep, (UNISOM) 25 MG tablet Take 25 mg by mouth at bedtime as needed for sleep.    empagliflozin (JARDIANCE) 25 MG TABS tablet Take 1 tablet (25 mg total) by mouth in the morning.   esomeprazole (NEXIUM) 40 MG capsule TAKE (1) CAPSULE DAILY   glucose blood (ONETOUCH ULTRA) test strip Check blood sugar 4 times daily Dx E 11.40   Insulin Pen Needle (PEN NEEDLES) 32G X 5 MM MISC 1  Device by Does not apply route daily.   JANUVIA 100 MG tablet TAKE 1 TABLET DAILY   MELATONIN PO Take by mouth.   metFORMIN (GLUCOPHAGE-XR) 500 MG 24 hr tablet TAKE 2 TABLETS 2 TIMES A DAY WITH MEALS   mupirocin ointment (BACTROBAN) 2 % Apply 1 application topically 2 (two) times daily.   naproxen (NAPROSYN) 500 MG tablet Take 1 tablet (500 mg total) by mouth 2 (two) times daily with a meal for 14 days.   olmesartan (BENICAR) 20 MG tablet TAKE 1 TABLET ONCE DAILY   Probiotic Product (PROBIOTIC PO) Take 1 tablet by mouth daily.   simvastatin (ZOCOR) 40 MG tablet TAKE 1 TABLET ONCE DAILY IN THE EVENING   tamsulosin (FLOMAX) 0.4 MG CAPS capsule Take 1 capsule (0.4 mg total) by mouth at bedtime.   TRESIBA FLEXTOUCH 100 UNIT/ML FlexTouch Pen Inject 0.06-0.2 mLs (6-20 Units total) into the skin daily at 10 pm.   triamcinolone ointment (KENALOG) 0.5 % APPLY TO AFFECTED AREAS TWICE A DAY   No facility-administered encounter medications on file as of 11/16/2021.    Allergies  Allergen Reactions   Ace Inhibitors Cough   Trazamine [Trazodone & Diet Manage Prod] Other (See Comments)    nightmares    Review of Systems  Constitutional:  Negative for activity change, appetite change, chills, diaphoresis, fatigue, fever and unexpected weight change.  HENT: Negative.    Eyes: Negative.   Respiratory:  Negative for cough, chest tightness and shortness of breath.   Cardiovascular:  Negative for chest pain, palpitations and leg swelling.  Gastrointestinal:   Negative for abdominal pain, blood in stool, constipation, diarrhea, nausea and vomiting.  Endocrine: Negative.   Genitourinary:  Negative for decreased urine volume, difficulty urinating, dysuria, frequency and urgency.  Musculoskeletal:  Positive for arthralgias and stiffness. Negative for myalgias.  Skin: Negative.  Negative for itching.  Allergic/Immunologic: Negative.   Neurological:  Negative for dizziness, tingling, numbness and headaches.  Hematological: Negative.   Psychiatric/Behavioral:  Negative for confusion, hallucinations, sleep disturbance and suicidal ideas.   All other systems reviewed and are negative.       Objective:  BP 136/66   Pulse (!) 57   Temp 97.9 F (36.6 C)   Ht _0  (1.753 m)   Wt 168 lb (76.2 kg)   SpO2 100%   BMI 24.81 kg/m    Wt Readings from Last 3 Encounters:  11/16/21 168 lb (76.2 kg)  10/17/21 164 lb (74.4 kg)  10/09/21 163 lb (73.9 kg)    Physical Exam Vitals and nursing note reviewed.  Constitutional:      General: He is not in acute distress.    Appearance: Normal appearance. He is not ill-appearing, toxic-appearing or diaphoretic.  HENT:     Head: Normocephalic and atraumatic.     Ears:     Comments: Bilateral hearing aids    Mouth/Throat:     Mouth: Mucous membranes are moist.  Eyes:     Pupils: Pupils are equal, round, and reactive to light.  Cardiovascular:     Rate and Rhythm: Normal rate and regular rhythm.     Pulses: Normal pulses.     Heart sounds: Normal heart sounds.  Pulmonary:     Effort: Pulmonary effort is normal.     Breath sounds: Normal breath sounds.  Musculoskeletal:     Right shoulder: Normal.     Left shoulder: Tenderness present. No swelling, deformity, effusion, laceration, bony tenderness or crepitus. Decreased range of motion. Normal  strength. Normal pulse.     Left upper arm: Normal.     Cervical back: Normal and neck supple.     Comments: Left shoulder: limited abduction and adduction due to  discomfort. Negative empty can sign. Positive Hawkins and Neer signs. No deformity or crepitus   Neurological:     Mental Status: He is alert.     Results for orders placed or performed in visit on 10/09/21  Bayer DCA Hb A1c Waived  Result Value Ref Range   HB A1C (BAYER DCA - WAIVED) 8.5 (H) 4.8 - 5.6 %     X-Ray: left shoulder: degenerative changes, slight AC joint separation, no acute findings. Preliminary x-ray reading by Monia Pouch, FNP-C, [facility].   Pertinent labs & imaging results that were available during my care of the patient were reviewed by me and considered in my medical decision making.  Assessment & Plan:  Nil was seen today for shoulder pain.  Diagnoses and all orders for this visit:  Acute pain of left shoulder Shoulder impingement syndrome, left Imaging without acute findings. Physical exam consistent with shoulder impingement. Will refer to PT and start naproxen twice daily for 14 days. Pt aware to report new, worsening, or persistent symptoms.  Naproxen instead of prednisone as we do not want blood sugars to rise and renal function has been normal. May need referral to ortho and MRI. -     DG Shoulder Left -     naproxen (NAPROSYN) 500 MG tablet; Take 1 tablet (500 mg total) by mouth 2 (two) times daily with a meal for 14 days. -     Ambulatory referral to Physical Therapy  Continue all other maintenance medications.  Follow up plan: Return in about 4 weeks (around 12/14/2021), or if symptoms worsen or fail to improve, for shoulder.   Continue healthy lifestyle choices, including diet (rich in fruits, vegetables, and lean proteins, and low in salt and simple carbohydrates) and exercise (at least 30 minutes of moderate physical activity daily).  Educational handout given for shoulder impingement   The above assessment and management plan was discussed with the patient. The patient verbalized understanding of and has agreed to the management plan. Patient  is aware to call the clinic if they develop any new symptoms or if symptoms persist or worsen. Patient is aware when to return to the clinic for a follow-up visit. Patient educated on when it is appropriate to go to the emergency department.   Monia Pouch, FNP-C [facility] [phone]

## 2021-11-17 ENCOUNTER — Other Ambulatory Visit: Payer: Self-pay | Admitting: Family Medicine

## 2021-11-17 DIAGNOSIS — K219 Gastro-esophageal reflux disease without esophagitis: Secondary | ICD-10-CM

## 2021-11-20 ENCOUNTER — Ambulatory Visit: Payer: Medicare PPO

## 2021-11-20 DIAGNOSIS — M25512 Pain in left shoulder: Secondary | ICD-10-CM | POA: Diagnosis not present

## 2021-11-20 DIAGNOSIS — M6281 Muscle weakness (generalized): Secondary | ICD-10-CM | POA: Diagnosis not present

## 2021-11-20 DIAGNOSIS — R2689 Other abnormalities of gait and mobility: Secondary | ICD-10-CM | POA: Diagnosis not present

## 2021-11-20 NOTE — Therapy (Signed)
[facility] [place], [place], [address] Phone: [phone]   Fax:  [phone]  Physical Therapy Treatment  Patient Details  Name: Joe [name] MRN: [id number] Date of Birth: 02-25-1941 Referring Provider (PT): Rakes, FNP   Encounter Date: 11/20/2021   PT End of Session - 11/20/21 1001     Visit Number 9    Number of Visits 12    Date for PT Re-Evaluation 12/28/21    PT Start Time 0956    PT Stop Time 1033    PT Time Calculation (min) 37 min             Past Medical History:  Diagnosis Date   Adenomatous colon polyp    Arthritis    Cataract    Chest pain at rest 05/26/2016   Diabetes mellitus without complication ([place])    Dyslipidemia    ESOPHAGITIS, REFLUX 07/09/2004   Qualifier: Diagnosis of  By: Hardin Negus CMA Deborra Medina), Stephanie     Hiatal hernia    HOH (hard of hearing)    has bilateral Hearing aids   Hypertension    Irritable bowel syndrome    Kidney stone 1970   Pneumonia    Tick bites    took 5 rounds of Doxycycline this summer    Past Surgical History:  Procedure Laterality Date   COLONOSCOPY     ESOPHAGEAL DILATION  1996   ESOPHAGEAL DILATION  2003   FLEXIBLE SIGMOIDOSCOPY     HEMICOLECTOMY  08/2005   Right   KNEE SURGERY Right    TONSILLECTOMY      There were no vitals filed for this visit.   Subjective Assessment - 11/20/21 0959     Subjective Pt arrives for today's treatment session denying any pain at rest, but reports 5/10 pain in left shoulder with flexion.  Pt has seen MD and has a new order for shoulder to be re-evaled at next appointment.    Currently in Pain? Yes    Pain Score 5     Pain Location Shoulder    Pain Orientation Left                               [facility] Adult PT Treatment/Exercise - 11/20/21 0001       Lumbar Exercises: Aerobic   Recumbent Bike Lvl 5-7 x 15 mins      Knee/Hip Exercises: Machines for Strengthening   Cybex Knee  Extension 30# 3 sets of 10    Cybex Knee Flexion 60# x 30 reps                 Balance Exercises - 11/20/21 0001       Balance Exercises: Standing   Step Ups Forward;UE support 2   BOSU (ball up) x 25 reps B   Tandem Gait Foam/compliant surface;5 reps;Forward;Retro   LUE support   Sidestepping Foam/compliant support;5 reps   BUE support   Other Standing Exercises BOSU (ball down) x 5 mins    Other Standing Exercises Comments Functional Squat x 15 reps; BOSU (ball down)                     PT Long Term Goals - 10/18/21 1419       PT LONG TERM GOAL #1   Title Patient will be independent with his HEP.    Time 6    Period Weeks  Status New    Target Date 11/29/21      PT LONG TERM GOAL #2   Title Patient will improve his TUG time to 14 seconds or less for improved safety and to reduce his fall risk.    Time 6    Period Weeks    Status New    Target Date 11/29/21      PT LONG TERM GOAL #3   Title Patient will be able to improve his gait speed to at least 0.8 m/s for improved household and community mobility.    Time 6    Period Weeks    Status New    Target Date 11/29/21                   Plan - 11/20/21 1001     Clinical Impression Statement Pt arrives for today's treatment session denying any pain at rest, but does report 5/10 left shoulder pain with flexion.  Pt arrives for today's treatment session 10 min late.  Pt able to tolerate additional balance activities on Airex balance beam with constant CGA +1.  Pt continues to be challenged by BOSU ball activities, but does demonstrate decrease unsteadiness as compared to last session.  Pt denied any pain at completion of today's treatment session.    Personal Factors and Comorbidities Comorbidity 3+;Time since onset of injury/illness/exacerbation;Age    Comorbidities HTN, DM, neuropathy, CKD,    Examination-Activity Limitations Locomotion Level;Transfers;Lift;Stand;Stairs;Dressing     Examination-Participation Restrictions Yard Work;Cleaning;Community Activity    Stability/Clinical Decision Making Evolving/Moderate complexity    Rehab Potential Fair    PT Frequency 2x / week    PT Duration 6 weeks    PT Treatment/Interventions ADLs/Self Care Home Management;Neuromuscular re-education;Balance training;Therapeutic exercise;Therapeutic activities;Functional mobility training;Gait training;Stair training;Patient/family education    PT Next Visit Plan nustep, lower extremity strengthening and balance interventions    Consulted and Agree with Plan of Care Patient;Family member/caregiver    Family Member Consulted [name])             Patient will benefit from skilled therapeutic intervention in order to improve the following deficits and impairments:  Abnormal gait, Difficulty walking, Decreased activity tolerance, Decreased balance, Decreased strength, Decreased mobility  Visit Diagnosis: Other abnormalities of gait and mobility  Muscle weakness (generalized)     Problem List Patient Active Problem List   Diagnosis Date Noted   Gait instability 10/09/2021   Exposure to Agent Orange 04/04/2021   Seborrheic dermatitis, unspecified 04/04/2021   Presbycusis of both ears 05/09/2020   Chronic mycotic otitis externa 09/29/2019   Chronic left shoulder pain 08/13/2019   CKD stage 3 due to type 2 diabetes mellitus ([place]) 01/27/2019   Erectile dysfunction due to arterial insufficiency 04/15/2018   Thrombocytopenia ([place]) 03/18/2015   Atopic dermatitis 01/23/2015   Thoracic disc herniation 06/20/2014   Left varicocele 06/20/2014   Abdominal aortic atherosclerosis ([place]) 06/20/2014   Ventral hernia without obstruction or gangrene 02/04/2014   History of colonic polyps 02/04/2014   Hyperlipidemia associated with type 2 diabetes mellitus ([place]) 12/30/2012   Hypertension associated with diabetes ([place]) 12/30/2012   Benign prostatic hyperplasia 12/30/2012   Type 2  diabetes mellitus with diabetic neuropathy, with long-term current use of insulin ([place]) 09/28/2012   RBBB 04/11/2010   Cardiovascular function study, abnormal 04/11/2010   Gastroesophageal reflux disease without esophagitis 09/14/2007   Nephrolithiasis 09/14/2007   Rationale for Evaluation and Treatment Rehabilitation  Kathrynn Ducking, PTA 11/20/2021, 10:37 AM  [facility] [place], [place], [address] Phone: [phone]   Fax:  [phone]  Name: Joe [name] MRN: [id number] Date of Birth: 1941-03-26

## 2021-11-23 ENCOUNTER — Ambulatory Visit: Payer: Medicare PPO

## 2021-11-23 DIAGNOSIS — M6281 Muscle weakness (generalized): Secondary | ICD-10-CM | POA: Diagnosis not present

## 2021-11-23 DIAGNOSIS — R2689 Other abnormalities of gait and mobility: Secondary | ICD-10-CM

## 2021-11-23 DIAGNOSIS — M25512 Pain in left shoulder: Secondary | ICD-10-CM

## 2021-11-28 ENCOUNTER — Ambulatory Visit: Payer: Medicare PPO

## 2021-11-28 DIAGNOSIS — R2689 Other abnormalities of gait and mobility: Secondary | ICD-10-CM | POA: Diagnosis not present

## 2021-11-28 DIAGNOSIS — M6281 Muscle weakness (generalized): Secondary | ICD-10-CM | POA: Diagnosis not present

## 2021-11-28 DIAGNOSIS — M25512 Pain in left shoulder: Secondary | ICD-10-CM

## 2021-11-28 NOTE — Therapy (Signed)
[facility] [place], [place], [address] Phone: [phone]   Fax:  [phone]  Physical Therapy Treatment  Patient Details  Name: Joe [name] MRN: [id number] Date of Birth: 11/18/1940 Referring Provider (PT): Rakes, FNP   Encounter Date: 11/28/2021   PT End of Session - 11/28/21 0951     Visit Number 11    Number of Visits 16    Date for PT Re-Evaluation 12/28/21    Authorization - Number of Visits 12    PT Start Time 0945    PT Stop Time 1025    PT Time Calculation (min) 40 min    Activity Tolerance Patient tolerated treatment well    Behavior During Therapy [facility] for tasks assessed/performed             Past Medical History:  Diagnosis Date   Adenomatous colon polyp    Arthritis    Cataract    Chest pain at rest 05/26/2016   Diabetes mellitus without complication ([place])    Dyslipidemia    ESOPHAGITIS, REFLUX 07/09/2004   Qualifier: Diagnosis of  By: Hardin Negus CMA Deborra Medina), Stephanie     Hiatal hernia    HOH (hard of hearing)    has bilateral Hearing aids   Hypertension    Irritable bowel syndrome    Kidney stone 1970   Pneumonia    Tick bites    took 5 rounds of Doxycycline this summer    Past Surgical History:  Procedure Laterality Date   COLONOSCOPY     [place]   ESOPHAGEAL DILATION  2003   FLEXIBLE SIGMOIDOSCOPY     HEMICOLECTOMY  08/2005   Right   KNEE SURGERY Right    TONSILLECTOMY      There were no vitals filed for this visit.   Subjective Assessment - 11/28/21 0951     Subjective Pt arrives for today's treatment session denying any pain.  Pt reports that shoulder feels much better.    Currently in Pain? No/denies    Pain Onset 1 to 4 weeks ago                               [facility] Adult PT Treatment/Exercise - 11/28/21 0001       Lumbar Exercises: Aerobic   Recumbent Bike Lvl 5 x 15 mins      Knee/Hip Exercises: Machines for Strengthening    Cybex Knee Extension 30# 3 sets of 10    Cybex Knee Flexion 60# x 30 reps    Cybex Leg Press 3 plates x 30 reps      Shoulder Exercises: Standing   Protraction Left;20 reps    Theraband Level (Shoulder Protraction) Level 3 (Green)    Horizontal ABduction Left;20 reps    Theraband Level (Shoulder Horizontal ABduction) Level 3 (Green)    External Rotation Left;20 reps    Theraband Level (Shoulder External Rotation) Level 3 (Green)    Internal Rotation Left;20 reps    Theraband Level (Shoulder Internal Rotation) Level 3 (Green)    Flexion Left;20 reps    Shoulder Flexion Weight (lbs) 5    ABduction Left;20 reps    Shoulder ABduction Weight (lbs) 5    Extension Left;20 reps    Theraband Level (Shoulder Extension) Level 3 (Green)    Row [employer] reps    Theraband Level (Shoulder Row) Level 3 (Green)    Other [employer] Ups,  left, 3# x 20 reps    Other Standing Exercises D1, left, red tband, 2 sets of 10                          PT Long Term Goals - 11/23/21 1041       PT LONG TERM GOAL #1   Title Patient will be independent with his HEP.    Time 6    Period Weeks    Status On-going    Target Date 11/29/21      PT LONG TERM GOAL #2   Title Patient will improve his TUG time to 14 seconds or less for improved safety and to reduce his fall risk.    Baseline 12.37 seconds    Time 6    Period Weeks    Status Achieved    Target Date 11/29/21      PT LONG TERM GOAL #3   Title Patient will be able to improve his gait speed to at least 0.8 m/s for improved household and community mobility.    Baseline 0.79 m/s    Time 6    Period Weeks    Status On-going    Target Date 11/29/21      PT LONG TERM GOAL #4   Title Patient will be able to demonstrate at least 120 degrees of left shoulder abduction for improved function reaching overhead.    Time 3    Period Weeks    Status New    Target Date 12/14/21      PT LONG TERM GOAL #5   Title Patient  will be able to complete his daily activities without is left shoulder pain exceeding 2/10.    Time 3    Period Weeks    Status New    Target Date 12/14/21                   Plan - 11/28/21 6387     Clinical Impression Statement Pt arrives for today's treatment session denying any pain.  Pt requiring cues for eccentric control with all cybex exercises during today's treatment session.  Pt also requiring cues to go through full ROM with each rep.  Pt introduced to numerous standing right upper extremity exercises to increase strength and function of RUE while also challenging his balance.  Pt without any unsteadiness with all standing exercises.  Pt requiring heavy cues for compensatory movements in later reps of each exercise.  Pt denied any pain at completion of today's treatment session, but does report increased right UE fatigue.    Personal Factors and Comorbidities Comorbidity 3+;Time since onset of injury/illness/exacerbation;Age;Other    Comorbidities HTN, DM, neuropathy, CKD,    Examination-Activity Limitations Locomotion Level;Transfers;Lift;Stand;Stairs;Dressing    Examination-Participation Restrictions Yard Work;Cleaning;Community Activity    Stability/Clinical Decision Making Evolving/Moderate complexity    Rehab Potential Fair    PT Frequency 2x / week    PT Duration 3 weeks    PT Treatment/Interventions ADLs/Self Care Home Management;Neuromuscular re-education;Balance training;Therapeutic exercise;Therapeutic activities;Functional mobility training;Gait training;Stair training;Patient/family education;Cryotherapy;Electrical Stimulation;Moist Heat;Passive range of motion;Manual techniques;Taping;Vasopneumatic Device    PT Next Visit Plan nustep, upper and  lower extremity strengthening and balance interventions; AVOID MANUAL THERAPY AND MODALITIES UNTIL AFTER VISIT #12    PT Home Exercise Plan Access Code: [email or web address]  URL: [email or web address]  Date: 11/23/2021   Prepared by: Jacqulynn Cadet    Exercises  - Seated Scapular Retraction  - 1 x daily - 7  x weekly - 3 sets - 10 reps  - Corner Pec Minor Stretch  - 1 x daily - 7 x weekly - 3 sets - 30 seconds  hold  - Seated Shoulder Flexion Towel Slide at Table Top  - 1 x daily - 7 x weekly - 3 sets - 10 reps    Consulted and Agree with Plan of Care Patient;Family member/caregiver    Family Member Consulted [name])             Patient will benefit from skilled therapeutic intervention in order to improve the following deficits and impairments:  Abnormal gait, Difficulty walking, Decreased activity tolerance, Decreased balance, Decreased strength, Decreased mobility  Visit Diagnosis: Other abnormalities of gait and mobility  Muscle weakness (generalized)  Acute pain of left shoulder     Problem List Patient Active Problem List   Diagnosis Date Noted   Gait instability 10/09/2021   Exposure to Agent [facility] 04/04/2021   Seborrheic dermatitis, unspecified 04/04/2021   Presbycusis of both ears 05/09/2020   Chronic mycotic otitis externa 09/29/2019   Chronic left shoulder pain 08/13/2019   CKD stage 3 due to type 2 diabetes mellitus ([place]) 01/27/2019   Erectile dysfunction due to arterial insufficiency 04/15/2018   Thrombocytopenia ([place]) 03/18/2015   Atopic dermatitis 01/23/2015   Thoracic disc herniation 06/20/2014   Left varicocele 06/20/2014   Abdominal aortic atherosclerosis ([place]) 06/20/2014   Ventral hernia without obstruction or gangrene 02/04/2014   History of colonic polyps 02/04/2014   Hyperlipidemia associated with type 2 diabetes mellitus ([place]) 12/30/2012   Hypertension associated with diabetes ([place]) 12/30/2012   Benign prostatic hyperplasia 12/30/2012   Type 2 diabetes mellitus with diabetic neuropathy, with long-term current use of insulin ([place]) 09/28/2012   RBBB 04/11/2010   Cardiovascular function study, abnormal 04/11/2010   Gastroesophageal reflux disease without  esophagitis 09/14/2007   Nephrolithiasis 09/14/2007   Rationale for Evaluation and Treatment [place], PTA 11/28/2021, 10:30 AM  [facility] [address] [place], [place], [address] Phone: [phone]   Fax:  [phone]  Name: Joe [name] MRN: [id number] Date of Birth: 11-May-1941

## 2021-11-30 ENCOUNTER — Encounter: Payer: Self-pay | Admitting: *Deleted

## 2021-11-30 ENCOUNTER — Ambulatory Visit: Payer: Medicare PPO | Admitting: *Deleted

## 2021-11-30 DIAGNOSIS — M6281 Muscle weakness (generalized): Secondary | ICD-10-CM | POA: Diagnosis not present

## 2021-11-30 DIAGNOSIS — Z794 Long term (current) use of insulin: Secondary | ICD-10-CM

## 2021-11-30 DIAGNOSIS — R2689 Other abnormalities of gait and mobility: Secondary | ICD-10-CM | POA: Diagnosis not present

## 2021-11-30 DIAGNOSIS — E1140 Type 2 diabetes mellitus with diabetic neuropathy, unspecified: Secondary | ICD-10-CM

## 2021-11-30 DIAGNOSIS — E785 Hyperlipidemia, unspecified: Secondary | ICD-10-CM

## 2021-11-30 DIAGNOSIS — M25512 Pain in left shoulder: Secondary | ICD-10-CM | POA: Diagnosis not present

## 2021-11-30 DIAGNOSIS — E1169 Type 2 diabetes mellitus with other specified complication: Secondary | ICD-10-CM

## 2021-11-30 NOTE — Therapy (Signed)
OUTPATIENT PHYSICAL THERAPY TREATMENT NOTE   Patient Name: Joe [name] MRN: [id number] DOB:03/16/1941, 81 y.o., male Today's Date: 11/30/2021  REFERRING PROVIDER: Thayer Ohm, FNP   PT End of Session - 11/30/21 0950     Visit Number 12    Number of Visits 16    Date for PT Re-Evaluation 12/28/21    PT Start Time 0945    PT Stop Time 1030    PT Time Calculation (min) 45 min             Past Medical History:  Diagnosis Date   Adenomatous colon polyp    Arthritis    Cataract    Chest pain at rest 05/26/2016   Diabetes mellitus without complication ([place])    Dyslipidemia    ESOPHAGITIS, REFLUX 07/09/2004   Qualifier: Diagnosis of  By: Hardin Negus CMA Deborra Medina), Stephanie     Hiatal hernia    HOH (hard of hearing)    has bilateral Hearing aids   Hypertension    Irritable bowel syndrome    Kidney stone 1970   Pneumonia    Tick bites    took 5 rounds of Doxycycline this summer   Past Surgical History:  Procedure Laterality Date   COLONOSCOPY     [place]  2003   FLEXIBLE SIGMOIDOSCOPY     HEMICOLECTOMY  08/2005   Right   KNEE SURGERY Right    TONSILLECTOMY     Patient Active Problem List   Diagnosis Date Noted   Gait instability 10/09/2021   Exposure to Agent Orange 04/04/2021   Seborrheic dermatitis, unspecified 04/04/2021   Presbycusis of both ears 05/09/2020   Chronic mycotic otitis externa 09/29/2019   Chronic left shoulder pain 08/13/2019   CKD stage 3 due to type 2 diabetes mellitus ([place]) 01/27/2019   Erectile dysfunction due to arterial insufficiency 04/15/2018   Thrombocytopenia ([place]) 03/18/2015   Atopic dermatitis 01/23/2015   Thoracic disc herniation 06/20/2014   Left varicocele 06/20/2014   Abdominal aortic atherosclerosis ([place]) 06/20/2014   Ventral hernia without obstruction or gangrene 02/04/2014   History of colonic polyps 02/04/2014   Hyperlipidemia associated with type 2 diabetes mellitus ([place]) 12/30/2012    Hypertension associated with diabetes ([place]) 12/30/2012   Benign prostatic hyperplasia 12/30/2012   Type 2 diabetes mellitus with diabetic neuropathy, with long-term current use of insulin ([place]) 09/28/2012   RBBB 04/11/2010   Cardiovascular function study, abnormal 04/11/2010   Gastroesophageal reflux disease without esophagitis 09/14/2007   Nephrolithiasis 09/14/2007    REFERRING DIAG: Gait instability   THERAPY DIAG:  Other abnormalities of gait and mobility  Muscle weakness (generalized)  Acute pain of left shoulder  Rationale for Evaluation and Treatment Rehabilitation  PERTINENT HISTORY:  Comorbidity 3+;Time since onset of injury/illness/exacerbation;Age   chronic back pain        Comorbidities HTN, DM, neuropathy, CKD,    PRECAUTIONS: Fall   SUBJECTIVE: Pt reports LT shldr soreness, but doi Ng okay PAIN:  Are you having pain? Yes: NPRS scale: 3/10 Pain location: LT shldr Pain description: sore Aggravating factors:   Relieving factors:       TODAY'S TREATMENT:                                     EXERCISE LOG  Exercise Repetitions and Resistance Comments  Bike L5 x 17mns  Handouts given for HEP for today's exs  RED Tband punches /Rows 2x10   RED tband IR and ER, EXT 2x10   Bicep curls 5# 2x10   Brachialis curls 5# 2x10   OH DB press  2x10        Blank cell = exercise not performed today    PATIENT EDUCATION:    HOME EXERCISE PROGRAM: Pt given HEP for LT shldr today with handout     PT Long Term Goals - 11/30/21 0973       PT LONG TERM GOAL #1   Title Patient will be independent with his HEP.    Time 6    Period Weeks    Status On-going    Target Date 11/29/21      PT LONG TERM GOAL #2   Title Patient will improve his TUG time to 14 seconds or less for improved safety and to reduce his fall risk.    Baseline 12.37 seconds    Time 6    Period Weeks    Status Achieved    Target Date 11/29/21      PT LONG TERM GOAL #3   Title Patient  will be able to improve his gait speed to at least 0.8 m/s for improved household and community mobility.    Baseline 0.79 m/s    Time 6    Period Weeks    Status On-going    Target Date 11/29/21      PT LONG TERM GOAL #4   Title Patient will be able to demonstrate at least 120 degrees of left shoulder abduction for improved function reaching overhead.    Time 3    Period Weeks    Status New    Target Date 12/14/21      PT LONG TERM GOAL #5   Title Patient will be able to complete his daily activities without is left shoulder pain exceeding 2/10.    Time 3    Period Weeks    Status New    Target Date 12/14/21              Plan - 11/30/21 5329     Clinical Impression Statement Pt arrived today doing fairly well, but reports some shldr pain LT side. Rx focused on LE and LT shldr exs with Pt being instructed in HEP and handout given. Pt did well with Red tband exs for LT shldr   Personal Factors and Comorbidities Comorbidity 3+;Time since onset of injury/illness/exacerbation;Age;Other    Comorbidities HTN, DM, neuropathy, CKD,    Examination-Activity Limitations Locomotion Level;Transfers;Lift;Stand;Stairs;Dressing    Examination-Participation Restrictions Yard Work;Cleaning;Community Activity    Stability/Clinical Decision Making Evolving/Moderate complexity    Rehab Potential Fair    PT Frequency 2x / week    PT Duration 3 weeks    PT Treatment/Interventions ADLs/Self Care Home Management;Neuromuscular re-education;Balance training;Therapeutic exercise;Therapeutic activities;Functional mobility training;Gait training;Stair training;Patient/family education;Cryotherapy;Electrical Stimulation;Moist Heat;Passive range of motion;Manual techniques;Taping;Vasopneumatic Device    PT Next Visit Plan nustep, upper and  lower extremity strengthening and balance interventions; AVOID MANUAL THERAPY AND MODALITIES UNTIL AFTER VISIT #12               Yang Rack,CHRIS, PTA 11/30/2021,  12:59 PM

## 2021-12-05 ENCOUNTER — Ambulatory Visit: Payer: Medicare PPO | Attending: Family Medicine | Admitting: Physical Therapy

## 2021-12-05 ENCOUNTER — Encounter: Payer: Self-pay | Admitting: Physical Therapy

## 2021-12-05 ENCOUNTER — Other Ambulatory Visit: Payer: Self-pay | Admitting: Family Medicine

## 2021-12-05 DIAGNOSIS — M25512 Pain in left shoulder: Secondary | ICD-10-CM | POA: Diagnosis not present

## 2021-12-05 DIAGNOSIS — E1140 Type 2 diabetes mellitus with diabetic neuropathy, unspecified: Secondary | ICD-10-CM

## 2021-12-05 DIAGNOSIS — R2689 Other abnormalities of gait and mobility: Secondary | ICD-10-CM | POA: Insufficient documentation

## 2021-12-05 DIAGNOSIS — M6281 Muscle weakness (generalized): Secondary | ICD-10-CM | POA: Insufficient documentation

## 2021-12-05 NOTE — Therapy (Addendum)
OUTPATIENT PHYSICAL THERAPY TREATMENT NOTE   Patient Name: Joe [name] MRN: [id number] DOB:05/11/1941, 81 y.o., male Today's Date: 12/05/2021  REFERRING PROVIDER: Thayer Ohm, FNP   PT End of Session - 12/05/21 0949     Visit Number 13    Number of Visits 16    Date for PT Re-Evaluation 12/28/21    Authorization - Number of Visits 12    PT Start Time 0948    PT Stop Time 1027    PT Time Calculation (min) 39 min    Activity Tolerance Patient tolerated treatment well    Behavior During Therapy [facility] for tasks assessed/performed             Past Medical History:  Diagnosis Date   Adenomatous colon polyp    Arthritis    Cataract    Chest pain at rest 05/26/2016   Diabetes mellitus without complication ([place])    Dyslipidemia    ESOPHAGITIS, REFLUX 07/09/2004   Qualifier: Diagnosis of  By: Hardin Negus CMA Deborra Medina), Stephanie     Hiatal hernia    HOH (hard of hearing)    has bilateral Hearing aids   Hypertension    Irritable bowel syndrome    Kidney stone 1970   Pneumonia    Tick bites    took 5 rounds of Doxycycline this summer   Past Surgical History:  Procedure Laterality Date   COLONOSCOPY     [place]  2003   FLEXIBLE SIGMOIDOSCOPY     HEMICOLECTOMY  08/2005   Right   KNEE SURGERY Right    TONSILLECTOMY     Patient Active Problem List   Diagnosis Date Noted   Gait instability 10/09/2021   Exposure to Agent [facility] 04/04/2021   Seborrheic dermatitis, unspecified 04/04/2021   Presbycusis of both ears 05/09/2020   Chronic mycotic otitis externa 09/29/2019   Chronic left shoulder pain 08/13/2019   CKD stage 3 due to type 2 diabetes mellitus ([place]) 01/27/2019   Erectile dysfunction due to arterial insufficiency 04/15/2018   Thrombocytopenia ([place]) 03/18/2015   Atopic dermatitis 01/23/2015   Thoracic disc herniation 06/20/2014   Left varicocele 06/20/2014   Abdominal aortic atherosclerosis ([place]) 06/20/2014   Ventral hernia without  obstruction or gangrene 02/04/2014   History of colonic polyps 02/04/2014   Hyperlipidemia associated with type 2 diabetes mellitus ([place]) 12/30/2012   Hypertension associated with diabetes ([place]) 12/30/2012   Benign prostatic hyperplasia 12/30/2012   Type 2 diabetes mellitus with diabetic neuropathy, with long-term current use of insulin ([place]) 09/28/2012   RBBB 04/11/2010   Cardiovascular function study, abnormal 04/11/2010   Gastroesophageal reflux disease without esophagitis 09/14/2007   Nephrolithiasis 09/14/2007    REFERRING DIAG: Gait instability   THERAPY DIAG:  Other abnormalities of gait and mobility  Muscle weakness (generalized)  Acute pain of left shoulder  Rationale for Evaluation and Treatment Rehabilitation  PERTINENT HISTORY:  Comorbidity 3+;Time since onset of injury/illness/exacerbation;Age   chronic back pain        Comorbidities HTN, DM, neuropathy, CKD,    PRECAUTIONS: Fall   SUBJECTIVE: Doing good.  PAIN:  Are you having pain? No     TODAY'S TREATMENT:                                     EXERCISE LOG  Exercise Repetitions and Resistance Comments  Bike  L5 x15 min   RED  Tband punches  3x10   RED tband IR and ER, EXT 3x10   Bicep curls 5# 2x10   Brachialis curls 5# 2x10   OH DB press  3# 2x10   Wall pushups  2x10 reps    Blank cell = exercise not performed today    PATIENT EDUCATION:    HOME EXERCISE PROGRAM: Pt given HEP for LT shldr today with handout     PT Long Term Goals - 11/30/21 6734       PT LONG TERM GOAL #1   Title Patient will be independent with his HEP.    Time 6    Period Weeks    Status Achieved   Target Date 11/29/21      PT LONG TERM GOAL #2   Title Patient will improve his TUG time to 14 seconds or less for improved safety and to reduce his fall risk.    Baseline 12.37 seconds    Time 6    Period Weeks    Status Achieved    Target Date 11/29/21      PT LONG TERM GOAL #3   Title Patient will be able  to improve his gait speed to at least 0.8 m/s for improved household and community mobility.    Baseline 0.79 m/s    Time 6    Period Weeks    Status On-going    Target Date 11/29/21      PT LONG TERM GOAL #4   Title Patient will be able to demonstrate at least 120 degrees of left shoulder abduction for improved function reaching overhead.    Time 3    Period Weeks    Status On-going   Target Date 12/14/21      PT LONG TERM GOAL #5   Title Patient will be able to complete his daily activities without is left shoulder pain exceeding 2/10.    Time 3    Period Weeks    Status Achieved   Target Date 12/14/21              Plan - 11/30/21 1937     Clinical Impression Statement Patient presented in clinic with no pain of the L shoulder. Patient can indicate the proximal bicep tendon that is particularly tender. Patient able to complete all strengthening as directed but muscle fatigue present. Patient compliant with HEP for shoulder strengthening as well.   Personal Factors and Comorbidities Comorbidity 3+;Time since onset of injury/illness/exacerbation;Age;Other    Comorbidities HTN, DM, neuropathy, CKD,    Examination-Activity Limitations Locomotion Level;Transfers;Lift;Stand;Stairs;Dressing    Examination-Participation Restrictions Yard Work;Cleaning;Community Activity    Stability/Clinical Decision Making Evolving/Moderate complexity    Rehab Potential Fair    PT Frequency 2x / week    PT Duration 3 weeks    PT Treatment/Interventions ADLs/Self Care Home Management;Neuromuscular re-education;Balance training;Therapeutic exercise;Therapeutic activities;Functional mobility training;Gait training;Stair training;Patient/family education;Cryotherapy;Electrical Stimulation;Moist Heat;Passive range of motion;Manual techniques;Taping;Vasopneumatic Device    PT Next Visit Plan nustep, upper and  lower extremity strengthening and balance interventions; AVOID MANUAL THERAPY AND MODALITIES  UNTIL AFTER VISIT #12               Standley Brooking, PTA 12/05/21 12:16 PM

## 2021-12-06 ENCOUNTER — Encounter: Payer: Self-pay | Admitting: Family Medicine

## 2021-12-06 ENCOUNTER — Ambulatory Visit: Payer: Medicare PPO | Admitting: Family Medicine

## 2021-12-06 VITALS — BP 112/62 | HR 60 | Temp 98.0°F | Ht 69.0 in | Wt 162.0 lb

## 2021-12-06 DIAGNOSIS — L282 Other prurigo: Secondary | ICD-10-CM

## 2021-12-06 MED ORDER — TRIAMCINOLONE ACETONIDE 0.1 % EX CREA: 1.0000 | TOPICAL_CREAM | Freq: Two times a day (BID) | CUTANEOUS | 0 refills | Status: DC

## 2021-12-06 MED ORDER — NYSTATIN 100000 UNIT/GM EX CREA: 1.0000 | TOPICAL_CREAM | Freq: Two times a day (BID) | CUTANEOUS | 0 refills | Status: DC

## 2021-12-06 NOTE — Progress Notes (Signed)
Subjective:  [name], male    DOB: [birth year], 81 y.o.   MRN: [id number]  Patient Care Team: Baruch Gouty, FNP as PCP - General (Family Medicine) Irine Seal, MD (Urology) Gatha Mayer, MD (Gastroenterology) Minus Breeding, MD as Consulting Physician (Cardiology) Lavera Guise, [facility] (Pharmacist) Harlen Labs, MD as Referring Physician (Optometry) Blanca Friend Royce Macadamia, [facility] as Pharmacist (Family Medicine)   Chief Complaint:  Rash   HPI: Joe [name] is a 81 y.o. male presenting on 12/06/2021 for Rash   Pt presents today for evaluation of a rash / wound to the top of his left foot. States this has been there for about 2 months. States started as a small bump and he has scratched it until it has become larger. No surrounding erythema. Admits to wearing old tennis shoes without socks, states they do get wet at times. No known cause of rash.   Rash This is a new problem. The current episode started more than 1 month ago. The problem has been waxing and waning since onset. The affected locations include the left foot. The rash is characterized by itchiness, redness and scaling. It is unknown if there was an exposure to a precipitant. Pertinent negatives include no anorexia, congestion, cough, diarrhea, eye pain, facial edema, fatigue, fever, joint pain, nail changes, rhinorrhea, shortness of breath, sore throat or vomiting. Past treatments include antihistamine. The treatment provided no relief.     Relevant past medical, surgical, family, and social history reviewed and updated as indicated.  Allergies and medications reviewed and updated. Data reviewed: Chart in Epic.   Past Medical History:  Diagnosis Date   Adenomatous colon polyp    Arthritis    Cataract    Chest pain at rest 05/26/2016   Diabetes mellitus without complication ([place])    Dyslipidemia    ESOPHAGITIS, REFLUX 07/09/2004   Qualifier: Diagnosis of  By: Hardin Negus CMA Deborra Medina), Stephanie      Hiatal hernia    HOH (hard of hearing)    has bilateral Hearing aids   Hypertension    Irritable bowel syndrome    Kidney stone 1970   Pneumonia    Tick bites    took 5 rounds of Doxycycline this summer    Past Surgical History:  Procedure Laterality Date   COLONOSCOPY     ESOPHAGEAL DILATION  1996   ESOPHAGEAL DILATION  2003   FLEXIBLE SIGMOIDOSCOPY     HEMICOLECTOMY  08/2005   Right   KNEE SURGERY Right    TONSILLECTOMY      Social History   Socioeconomic History   Marital status: Married    Spouse name: [name]   Number of children: 1   Years of education: 16   Highest education level: Bachelor's degree (e.g., BA, AB, BS)  Occupational History   Occupation: Retired Pharmacist, hospital  Tobacco Use   Smoking status: Former    Packs/day: 2.00    Types: Cigarettes    Start date: 06/03/1954    Quit date: 08/24/1984    Years since quitting: 37.3   Smokeless tobacco: Never   Tobacco comments:    Has not used to tobacco products for the past 20 years  Vaping Use   Vaping Use: Never used  Substance and Sexual Activity   Alcohol use: Yes    Alcohol/week: 7.0 standard drinks of alcohol    Types: 7 Standard drinks or equivalent per week    Comment: [place] at night  when he sits on the deck   Drug use: No   Sexual activity: Yes    Partners: Female  Other Topics Concern   Not on file  Social History Narrative   Lives in [place] with wife, both retired teachers   1 son   3 caffeinated beverages daily   1 alcoholic beverage daily   Former smoker no current tobacco no drug use   Social Determinants of Radio broadcast assistant Strain: Low Risk  (04/04/2021)   Overall Financial Resource Strain (CARDIA)    Difficulty of Paying Living Expenses: Not hard at all  Food Insecurity: No Food Insecurity (04/04/2021)   Hunger Vital Sign    Worried About Running Out of Food in the Last Year: Never true    [place] in the Last Year: Never true  Transportation Needs: No  Transportation Needs (04/04/2021)   PRAPARE - Hydrologist (Medical): No    Lack of Transportation (Non-Medical): No  Physical Activity: Insufficiently Active (04/04/2021)   Exercise Vital Sign    Days of Exercise per Week: 3 days    Minutes of Exercise per Session: 20 min  Stress: No Stress Concern Present (04/04/2021)   [place]    Feeling of Stress : Not at all  Social Connections: Moderately Isolated (04/04/2021)   Social Connection and Isolation Panel [NHANES]    Frequency of Communication with Friends and Family: More than three times a week    Frequency of Social Gatherings with Friends and Family: Once a week    Attends Religious Services: Never    Marine scientist or Organizations: No    Attends Archivist Meetings: Never    Marital Status: Married  Human resources officer Violence: Not At Risk (04/04/2021)   Humiliation, Afraid, Rape, and Kick questionnaire    Fear of Current or Ex-Partner: No    Emotionally Abused: No    Physically Abused: No    Sexually Abused: No    Outpatient Encounter Medications as of 12/06/2021  Medication Sig   Accu-Chek Softclix Lancets lancets CHECK BLOOD SUGAR 4 TIMES A DAY OR AS DIRECTED   aspirin 81 MG tablet Take 81 mg by mouth daily.   Blood Glucose Monitoring Suppl w/Device KIT Test BS BID and PRN dx E11.9. Give test strips and lancets to match machine given   cholecalciferol (VITAMIN D) 1000 UNITS tablet Take 2,000 Units by mouth daily.   Cinnamon 500 MG TABS Take 1,000 mg by mouth daily.   Continuous Blood Gluc Receiver (FREESTYLE LIBRE 2 READER) DEVI Use to test blood sugars up to 6 times daily as directed. DX: E11.9   Continuous Blood Gluc Sensor (FREESTYLE LIBRE 2 SENSOR) MISC USE AS DIRECTED UP TO SIX TIMES DAILY. CHANGE EVERY 14 DAYS   diclofenac Sodium (VOLTAREN) 1 % GEL Apply 2 g topically 4 (four) times daily.   diphenhydrAMINE  (BENADRYL) 25 MG tablet Take 25 mg by mouth every 6 (six) hours as needed.   doxylamine, Sleep, (UNISOM) 25 MG tablet Take 25 mg by mouth at bedtime as needed for sleep.    empagliflozin (JARDIANCE) 25 MG TABS tablet Take 1 tablet (25 mg total) by mouth in the morning.   esomeprazole (NEXIUM) 40 MG capsule TAKE (1) CAPSULE DAILY   glucose blood (ONETOUCH ULTRA) test strip Check blood sugar 4 times daily Dx E 11.40   Insulin Pen Needle (PEN NEEDLES) 32G X  5 MM MISC 1 Device by Does not apply route daily.   JANUVIA 100 MG tablet TAKE 1 TABLET DAILY   MELATONIN PO Take by mouth.   metFORMIN (GLUCOPHAGE-XR) 500 MG 24 hr tablet TAKE 2 TABLETS 2 TIMES A DAY WITH MEALS   mupirocin ointment (BACTROBAN) 2 % Apply 1 application topically 2 (two) times daily.   nystatin cream (MYCOSTATIN) Apply 1 Application topically 2 (two) times daily.   olmesartan (BENICAR) 20 MG tablet TAKE 1 TABLET ONCE DAILY   Probiotic Product (PROBIOTIC PO) Take 1 tablet by mouth daily.   simvastatin (ZOCOR) 40 MG tablet TAKE 1 TABLET ONCE DAILY IN THE EVENING   tamsulosin (FLOMAX) 0.4 MG CAPS capsule Take 1 capsule (0.4 mg total) by mouth at bedtime.   TRESIBA FLEXTOUCH 100 UNIT/ML FlexTouch Pen Inject 0.06-0.2 mLs (6-20 Units total) into the skin daily at 10 pm.   triamcinolone cream (KENALOG) 0.1 % Apply 1 Application topically 2 (two) times daily.   [DISCONTINUED] triamcinolone ointment (KENALOG) 0.5 % APPLY TO AFFECTED AREAS TWICE A DAY   No facility-administered encounter medications on file as of 12/06/2021.    Allergies  Allergen Reactions   Ace Inhibitors Cough   Trazamine [Trazodone & Diet Manage Prod] Other (See Comments)    nightmares    Review of Systems  Constitutional:  Negative for activity change, appetite change, chills, diaphoresis, fatigue, fever and unexpected weight change.  HENT:  Negative for congestion, rhinorrhea, sore throat, trouble swallowing and voice change.   Eyes:  Negative for  photophobia, pain and visual disturbance.  Respiratory:  Negative for cough and shortness of breath.   Cardiovascular:  Negative for chest pain, palpitations and leg swelling.  Gastrointestinal:  Negative for abdominal distention, anorexia, diarrhea, nausea and vomiting.  Genitourinary:  Negative for decreased urine volume and difficulty urinating.  Musculoskeletal:  Negative for joint pain.  Skin:  Positive for color change, rash and wound. Negative for nail changes and pallor.  Neurological:  Negative for dizziness, weakness, light-headedness and headaches.  Psychiatric/Behavioral:  Negative for confusion.   All other systems reviewed and are negative.       Objective:  BP 112/62   Pulse 60   Temp 98 F (36.7 C)   Ht _0  (1.753 m)   Wt 162 lb (73.5 kg)   SpO2 99%   BMI 23.92 kg/m    Wt Readings from Last 3 Encounters:  12/06/21 162 lb (73.5 kg)  11/16/21 168 lb (76.2 kg)  10/17/21 164 lb (74.4 kg)    Physical Exam Vitals and nursing note reviewed.  Constitutional:      General: He is not in acute distress.    Appearance: Normal appearance. He is normal weight. He is not ill-appearing, toxic-appearing or diaphoretic.  HENT:     Head: Normocephalic and atraumatic.     Comments: Bilateral hearing aids    Mouth/Throat:     Mouth: Mucous membranes are moist.  Eyes:     Pupils: Pupils are equal, round, and reactive to light.  Cardiovascular:     Rate and Rhythm: Normal rate and regular rhythm.     Heart sounds: Normal heart sounds.  Pulmonary:     Effort: Pulmonary effort is normal.     Breath sounds: Normal breath sounds.  Musculoskeletal:     Right lower leg: No edema.     Left lower leg: No edema.  Skin:    General: Skin is warm and dry.     Capillary Refill:  Capillary refill takes less than 2 seconds.     Findings: Erythema, rash and wound present. Rash is crusting and vesicular.       Neurological:     General: No focal deficit present.     Mental  Status: He is alert and oriented to person, place, and time.  Psychiatric:        Mood and Affect: Mood normal.        Behavior: Behavior normal.        Thought Content: Thought content normal.        Judgment: Judgment normal.     Results for orders placed or performed in visit on 10/09/21  Bayer DCA Hb A1c Waived  Result Value Ref Range   HB A1C (BAYER DCA - WAIVED) 8.5 (H) 4.8 - 5.6 %       Pertinent labs & imaging results that were available during my care of the patient were reviewed by me and considered in my medical decision making.  Assessment & Plan:  Esvin was seen today for rash.  Diagnoses and all orders for this visit:  Pruritic rash Concerning for fungal due to wearing wet shoes without socks. Will treat with antifungal and steroid cream as prescribed. No indications of bacterial infection. Report new, worsening, or persistent symptoms.  -     triamcinolone cream (KENALOG) 0.1 %; Apply 1 Application topically 2 (two) times daily. -     nystatin cream (MYCOSTATIN); Apply 1 Application topically 2 (two) times daily.     Continue all other maintenance medications.  Follow up plan: Return if symptoms worsen or fail to improve.   Continue healthy lifestyle choices, including diet (rich in fruits, vegetables, and lean proteins, and low in salt and simple carbohydrates) and exercise (at least 30 minutes of moderate physical activity daily).  Educational handout given for rash  The above assessment and management plan was discussed with the patient. The patient verbalized understanding of and has agreed to the management plan. Patient is aware to call the clinic if they develop any new symptoms or if symptoms persist or worsen. Patient is aware when to return to the clinic for a follow-up visit. Patient educated on when it is appropriate to go to the emergency department.   Monia Pouch, FNP-C [facility] [phone]

## 2021-12-11 ENCOUNTER — Ambulatory Visit: Payer: Medicare PPO | Admitting: Physical Therapy

## 2021-12-11 ENCOUNTER — Encounter: Payer: Self-pay | Admitting: Physical Therapy

## 2021-12-11 ENCOUNTER — Other Ambulatory Visit: Payer: Self-pay | Admitting: Family Medicine

## 2021-12-11 DIAGNOSIS — M25512 Pain in left shoulder: Secondary | ICD-10-CM | POA: Diagnosis not present

## 2021-12-11 DIAGNOSIS — M6281 Muscle weakness (generalized): Secondary | ICD-10-CM

## 2021-12-11 DIAGNOSIS — E1140 Type 2 diabetes mellitus with diabetic neuropathy, unspecified: Secondary | ICD-10-CM

## 2021-12-11 DIAGNOSIS — R2689 Other abnormalities of gait and mobility: Secondary | ICD-10-CM | POA: Diagnosis not present

## 2021-12-11 NOTE — Therapy (Addendum)
OUTPATIENT PHYSICAL THERAPY TREATMENT NOTE   Patient Name: Joe [name] MRN: [id number] DOB:1941-05-03, 81 y.o., male Today's Date: 12/11/2021  REFERRING PROVIDER: Thayer Ohm, FNP   PT End of Session - 12/05/21 0949     Visit Number 14   Number of Visits 16    Date for PT Re-Evaluation 12/28/21    Authorization - Number of Visits 12    PT Start Time 0946   PT Stop Time 1034   PT Time Calculation (min) 48 min    Activity Tolerance Patient tolerated treatment well    Behavior During Therapy [facility] for tasks assessed/performed             Past Medical History:  Diagnosis Date   Adenomatous colon polyp    Arthritis    Cataract    Chest pain at rest 05/26/2016   Diabetes mellitus without complication ([place])    Dyslipidemia    ESOPHAGITIS, REFLUX 07/09/2004   Qualifier: Diagnosis of  By: Hardin Negus CMA Deborra Medina), Stephanie     Hiatal hernia    HOH (hard of hearing)    has bilateral Hearing aids   Hypertension    Irritable bowel syndrome    Kidney stone 1970   Pneumonia    Tick bites    took 5 rounds of Doxycycline this summer   Past Surgical History:  Procedure Laterality Date   COLONOSCOPY     [place]  2003   FLEXIBLE SIGMOIDOSCOPY     HEMICOLECTOMY  08/2005   Right   KNEE SURGERY Right    TONSILLECTOMY     Patient Active Problem List   Diagnosis Date Noted   Gait instability 10/09/2021   Exposure to Agent [facility] 04/04/2021   Seborrheic dermatitis, unspecified 04/04/2021   Presbycusis of both ears 05/09/2020   Chronic mycotic otitis externa 09/29/2019   Chronic left shoulder pain 08/13/2019   CKD stage 3 due to type 2 diabetes mellitus ([place]) 01/27/2019   Erectile dysfunction due to arterial insufficiency 04/15/2018   Thrombocytopenia ([place]) 03/18/2015   Atopic dermatitis 01/23/2015   Thoracic disc herniation 06/20/2014   Left varicocele 06/20/2014   Abdominal aortic atherosclerosis ([place]) 06/20/2014   Ventral hernia without  obstruction or gangrene 02/04/2014   History of colonic polyps 02/04/2014   Hyperlipidemia associated with type 2 diabetes mellitus ([place]) 12/30/2012   Hypertension associated with diabetes ([place]) 12/30/2012   Benign prostatic hyperplasia 12/30/2012   Type 2 diabetes mellitus with diabetic neuropathy, with long-term current use of insulin ([place]) 09/28/2012   RBBB 04/11/2010   Cardiovascular function study, abnormal 04/11/2010   Gastroesophageal reflux disease without esophagitis 09/14/2007   Nephrolithiasis 09/14/2007    REFERRING DIAG: Gait instability   THERAPY DIAG:  Other abnormalities of gait and mobility  Muscle weakness (generalized)  Acute pain of left shoulder  Rationale for Evaluation and Treatment Rehabilitation  PERTINENT HISTORY:  Comorbidity 3+;Time since onset of injury/illness/exacerbation;Age   chronic back pain        Comorbidities HTN, DM, neuropathy, CKD,    PRECAUTIONS: Fall   SUBJECTIVE: Requests to work more on balance today but his shoulder doesn't bother him anymore.  PAIN:  Are you having pain? No     TODAY'S TREATMENT:                                     EXERCISE LOG  Exercise Repetitions and Resistance Comments  Bike  L5 x15 min   NBOS on airex X6 min total (static, head turns, cross body reaching)   Toes on beam X2 min with visual distraction   NBOS on horizontal beam  X5 min with critical thinking, recollection, visual distraction   NBOS on horizontal beam With shoulder flexion 1# for functional training   Tandem on beam X5 min with visual distraction   Sidestepping on beam X3 RT        Blank cell = exercise not performed today   AROM Right date  Left 12/11/2021 date  Shoulder flexion    Shoulder extension    Shoulder abduction  147  Shoulder adduction    Shoulder internal rotation    Shoulder external rotation    Elbow flexion    Elbow extension    Wrist flexion    Wrist extension    Wrist ulnar deviation    Wrist radial  deviation    Wrist pronation    Wrist supination    (Blank rows = not tested)   PATIENT EDUCATION:    HOME EXERCISE PROGRAM: Pt given HEP for LT shldr today with handout     PT Long Term Goals - 11/30/21 2952       PT LONG TERM GOAL #1   Title Patient will be independent with his HEP.    Time 6    Period Weeks    Status Achieved   Target Date 11/29/21      PT LONG TERM GOAL #2   Title Patient will improve his TUG time to 14 seconds or less for improved safety and to reduce his fall risk.    Baseline 12.37 seconds    Time 6    Period Weeks    Status Achieved    Target Date 11/29/21      PT LONG TERM GOAL #3   Title Patient will be able to improve his gait speed to at least 0.8 m/s for improved household and community mobility.    Baseline 0.79 m/s    Time 6    Period Weeks    Status On-going    Target Date 11/29/21      PT LONG TERM GOAL #4   Title Patient will be able to demonstrate at least 120 degrees of left shoulder abduction for improved function reaching overhead.    Time 3    Period Weeks    Status Achieved (147 deg)   Target Date 12/14/21      PT LONG TERM GOAL #5   Title Patient will be able to complete his daily activities without is left shoulder pain exceeding 2/10.    Time 3    Period Weeks    Status Achieved   Target Date 12/14/21              Plan - 11/30/21 8413     Clinical Impression Statement Patient requested to work on balance more as he is no longer experiencing shoulder pain. L shoulder abduction goal achieved as he was able to achieve 147 deg. Patient and wife have both noticed that as he walks the angle of trunk flexion increases. Patient progressed through multiple balance activities with functional involvement such as head turns, visual distraction via enviroment and shoulder flexion. Patient also has noticed his COG is more anteriorly and feels more through his anterior foot. Patient does have neuropathy and reports that his  toes are numb.    Personal Factors and Comorbidities Comorbidity 3+;Time since onset of  injury/illness/exacerbation;Age;Other    Comorbidities HTN, DM, neuropathy, CKD,    Examination-Activity Limitations Locomotion Level;Transfers;Lift;Stand;Stairs;Dressing    Examination-Participation Restrictions Yard Work;Cleaning;Community Activity    Stability/Clinical Decision Making Evolving/Moderate complexity    Rehab Potential Fair    PT Frequency 2x / week    PT Duration 3 weeks    PT Treatment/Interventions ADLs/Self Care Home Management;Neuromuscular re-education;Balance training;Therapeutic exercise;Therapeutic activities;Functional mobility training;Gait training;Stair training;Patient/family education;Cryotherapy;Electrical Stimulation;Moist Heat;Passive range of motion;Manual techniques;Taping;Vasopneumatic Device    PT Next Visit Plan nustep, upper and  lower extremity strengthening and balance interventions;               Standley Brooking, PTA 12/11/21 10:59 AM

## 2021-12-14 ENCOUNTER — Ambulatory Visit: Payer: Medicare PPO | Admitting: *Deleted

## 2021-12-14 ENCOUNTER — Encounter: Payer: Self-pay | Admitting: *Deleted

## 2021-12-14 DIAGNOSIS — M6281 Muscle weakness (generalized): Secondary | ICD-10-CM | POA: Diagnosis not present

## 2021-12-14 DIAGNOSIS — M25512 Pain in left shoulder: Secondary | ICD-10-CM | POA: Diagnosis not present

## 2021-12-14 DIAGNOSIS — R2689 Other abnormalities of gait and mobility: Secondary | ICD-10-CM

## 2021-12-14 NOTE — Therapy (Signed)
OUTPATIENT PHYSICAL THERAPY TREATMENT NOTE   Patient Name: Joe [name] MRN: [id number] DOB:1940/10/11, 81 y.o., male Today's Date: 12/14/2021  REFERRING PROVIDER: Thayer Ohm, FNP   PT End of Session - 12/05/21 0949     Visit Number 15   Number of Visits 16    Date for PT Re-Evaluation 12/28/21    Authorization - Number of Visits 12    PT Start Time 0946   PT Stop Time 1033   PT Time Calculation (min) 48 min    Activity Tolerance Patient tolerated treatment well    Behavior During Therapy [facility] for tasks assessed/performed             Past Medical History:  Diagnosis Date   Adenomatous colon polyp    Arthritis    Cataract    Chest pain at rest 05/26/2016   Diabetes mellitus without complication ([place])    Dyslipidemia    ESOPHAGITIS, REFLUX 07/09/2004   Qualifier: Diagnosis of  By: Hardin Negus CMA Deborra Medina), Stephanie     Hiatal hernia    HOH (hard of hearing)    has bilateral Hearing aids   Hypertension    Irritable bowel syndrome    Kidney stone 1970   Pneumonia    Tick bites    took 5 rounds of Doxycycline this summer   Past Surgical History:  Procedure Laterality Date   COLONOSCOPY     [place]  2003   FLEXIBLE SIGMOIDOSCOPY     HEMICOLECTOMY  08/2005   Right   KNEE SURGERY Right    TONSILLECTOMY     Patient Active Problem List   Diagnosis Date Noted   Gait instability 10/09/2021   Exposure to Agent Orange 04/04/2021   Seborrheic dermatitis, unspecified 04/04/2021   Presbycusis of both ears 05/09/2020   Chronic mycotic otitis externa 09/29/2019   Chronic left shoulder pain 08/13/2019   CKD stage 3 due to type 2 diabetes mellitus ([place]) 01/27/2019   Erectile dysfunction due to arterial insufficiency 04/15/2018   Thrombocytopenia ([place]) 03/18/2015   Atopic dermatitis 01/23/2015   Thoracic disc herniation 06/20/2014   Left varicocele 06/20/2014   Abdominal aortic atherosclerosis ([place]) 06/20/2014   Ventral hernia without  obstruction or gangrene 02/04/2014   History of colonic polyps 02/04/2014   Hyperlipidemia associated with type 2 diabetes mellitus ([place]) 12/30/2012   Hypertension associated with diabetes ([place]) 12/30/2012   Benign prostatic hyperplasia 12/30/2012   Type 2 diabetes mellitus with diabetic neuropathy, with long-term current use of insulin ([place]) 09/28/2012   RBBB 04/11/2010   Cardiovascular function study, abnormal 04/11/2010   Gastroesophageal reflux disease without esophagitis 09/14/2007   Nephrolithiasis 09/14/2007    REFERRING DIAG: Gait instability   THERAPY DIAG:  Other abnormalities of gait and mobility  Muscle weakness (generalized)  Rationale for Evaluation and Treatment Rehabilitation  PERTINENT HISTORY:  Comorbidity 3+;Time since onset of injury/illness/exacerbation;Age   chronic back pain        Comorbidities HTN, DM, neuropathy, CKD,    PRECAUTIONS: Fall   SUBJECTIVE:   Did good after last Rx  PAIN:  Are you having pain? No     TODAY'S TREATMENT:                                     EXERCISE LOG    12-14-21  Exercise Repetitions and Resistance Comments  Bike  L5 x15 min   NBOS  on airex X6 min total with reaches x 20, 4# ball reach outs 2x10   Toes on beam    NBOS on horizontal beam     NBOS on horizontal beam With shoulder flexion 1# for functional training   Tandem stance  X5 with each foot forward   Sidestepping on beam  X 4 mins    Rocker board  X 5 mins        Blank cell = exercise not performed today   AROM Right date  Left 12/11/2021 date  Shoulder flexion    Shoulder extension    Shoulder abduction  147  Shoulder adduction    Shoulder internal rotation    Shoulder external rotation    Elbow flexion    Elbow extension    Wrist flexion    Wrist extension    Wrist ulnar deviation    Wrist radial deviation    Wrist pronation    Wrist supination    (Blank rows = not tested)   PATIENT EDUCATION:    HOME EXERCISE PROGRAM: Pt given  HEP for LT shldr today with handout     PT Long Term Goals - 11/30/21 4782       PT LONG TERM GOAL #1   Title Patient will be independent with his HEP.    Time 6    Period Weeks    Status Achieved   Target Date 11/29/21      PT LONG TERM GOAL #2   Title Patient will improve his TUG time to 14 seconds or less for improved safety and to reduce his fall risk.    Baseline 12.37 seconds    Time 6    Period Weeks    Status Achieved    Target Date 11/29/21      PT LONG TERM GOAL #3   Title Patient will be able to improve his gait speed to at least 0.8 m/s for improved household and community mobility.    Baseline 0.79 m/s    Time 6    Period Weeks    Status On-going    Target Date 11/29/21      PT LONG TERM GOAL #4   Title Patient will be able to demonstrate at least 120 degrees of left shoulder abduction for improved function reaching overhead.    Time 3    Period Weeks    Status Achieved (147 deg)   Target Date 12/14/21      PT LONG TERM GOAL #5   Title Patient will be able to complete his daily activities without is left shoulder pain exceeding 2/10.    Time 3    Period Weeks    Status Achieved   Target Date 12/14/21              Plan - 12/14/21 9562     Clinical Impression Statement Pt arrived today doing fairly well and wanted to continue with focus on balance ACT.'s and has 1 visit left and DC   Personal Factors and Comorbidities Comorbidity 3+;Time since onset of injury/illness/exacerbation;Age;Other    Comorbidities HTN, DM, neuropathy, CKD,    Examination-Activity Limitations Locomotion Level;Transfers;Lift;Stand;Stairs;Dressing    Examination-Participation Restrictions Yard Work;Cleaning;Community Activity    Stability/Clinical Decision Making Evolving/Moderate complexity    Rehab Potential Fair    PT Frequency 2x / week    PT Duration 3 weeks    PT Treatment/Interventions ADLs/Self Care Home Management;Neuromuscular re-education;Balance  training;Therapeutic exercise;Therapeutic activities;Functional mobility training;Gait training;Stair training;Patient/family education;Cryotherapy;Electrical Stimulation;Moist Heat;Passive range of  motion;Manual techniques;Taping;Vasopneumatic Device    PT Next Visit Plan DC to [employer], PTA 12/14/21 10:48 AM  12/14/21 10:48 AM

## 2021-12-18 ENCOUNTER — Ambulatory Visit: Payer: Medicare PPO | Admitting: Family Medicine

## 2021-12-19 ENCOUNTER — Ambulatory Visit: Payer: Medicare PPO

## 2021-12-19 DIAGNOSIS — M6281 Muscle weakness (generalized): Secondary | ICD-10-CM

## 2021-12-19 DIAGNOSIS — M25512 Pain in left shoulder: Secondary | ICD-10-CM | POA: Diagnosis not present

## 2021-12-19 DIAGNOSIS — R2689 Other abnormalities of gait and mobility: Secondary | ICD-10-CM | POA: Diagnosis not present

## 2021-12-19 NOTE — Therapy (Addendum)
OUTPATIENT PHYSICAL THERAPY TREATMENT NOTE   Patient Name: Joe [name] MRN: [id number] DOB:[birth year], 81 y.o., male Today's Date: 12/19/2021  REFERRING PROVIDER: Thayer Ohm, FNP   PT End of Session - 12/05/21 0949     Visit Number 15   Number of Visits 16    Date for PT Re-Evaluation 12/28/21    Authorization - Number of Visits 12    PT Start Time 0946   PT Stop Time 1033   PT Time Calculation (min) 48 min    Activity Tolerance Patient tolerated treatment well    Behavior During Therapy [facility] for tasks assessed/performed             Past Medical History:  Diagnosis Date   Adenomatous colon polyp    Arthritis    Cataract    Chest pain at rest 05/26/2016   Diabetes mellitus without complication ([place])    Dyslipidemia    ESOPHAGITIS, REFLUX 07/09/2004   Qualifier: Diagnosis of  By: Hardin Negus CMA Deborra Medina), Stephanie     Hiatal hernia    HOH (hard of hearing)    has bilateral Hearing aids   Hypertension    Irritable bowel syndrome    Kidney stone 1970   Pneumonia    Tick bites    took 5 rounds of Doxycycline this summer   Past Surgical History:  Procedure Laterality Date   COLONOSCOPY     [place]  2003   FLEXIBLE SIGMOIDOSCOPY     HEMICOLECTOMY  08/2005   Right   KNEE SURGERY Right    TONSILLECTOMY     Patient Active Problem List   Diagnosis Date Noted   Gait instability 10/09/2021   Exposure to Agent [facility] 04/04/2021   Seborrheic dermatitis, unspecified 04/04/2021   Presbycusis of both ears 05/09/2020   Chronic mycotic otitis externa 09/29/2019   Chronic left shoulder pain 08/13/2019   CKD stage 3 due to type 2 diabetes mellitus ([place]) 01/27/2019   Erectile dysfunction due to arterial insufficiency 04/15/2018   Thrombocytopenia ([place]) 03/18/2015   Atopic dermatitis 01/23/2015   Thoracic disc herniation 06/20/2014   Left varicocele 06/20/2014   Abdominal aortic atherosclerosis ([place]) 06/20/2014   Ventral hernia without  obstruction or gangrene 02/04/2014   History of colonic polyps 02/04/2014   Hyperlipidemia associated with type 2 diabetes mellitus ([place]) 12/30/2012   Hypertension associated with diabetes ([place]) 12/30/2012   Benign prostatic hyperplasia 12/30/2012   Type 2 diabetes mellitus with diabetic neuropathy, with long-term current use of insulin ([place]) 09/28/2012   RBBB 04/11/2010   Cardiovascular function study, abnormal 04/11/2010   Gastroesophageal reflux disease without esophagitis 09/14/2007   Nephrolithiasis 09/14/2007    REFERRING DIAG: Gait instability   THERAPY DIAG:  Other abnormalities of gait and mobility  Muscle weakness (generalized)  Acute pain of left shoulder  Rationale for Evaluation and Treatment Rehabilitation  PERTINENT HISTORY:  Comorbidity 3+;Time since onset of injury/illness/exacerbation;Age   chronic back pain        Comorbidities HTN, DM, neuropathy, CKD,    PRECAUTIONS: Fall   SUBJECTIVE:   Pt ready for discharge.  PAIN:  Are you having pain? No     TODAY'S TREATMENT:                                     EXERCISE LOG    12-19-21  Exercise Repetitions and Resistance Comments  Bike  L5-7  x15 min   NBOS on airex X6 min total with reaches x 20, 4# ball reach outs 2x10   Toes on beam    NBOS on horizontal beam     NBOS on horizontal beam With shoulder flexion 1# for functional training x 3 mins   Tandem gait on beam X 5 mins    Sidestepping on beam X 5 mins    Rocker board  X 5 mins   SLS 3 reps with 15 sec hold, each side    Blank cell = exercise not performed today   AROM Right date  Left 12/11/2021 date  Shoulder flexion    Shoulder extension    Shoulder abduction  147  Shoulder adduction    Shoulder internal rotation    Shoulder external rotation    Elbow flexion    Elbow extension    Wrist flexion    Wrist extension    Wrist ulnar deviation    Wrist radial deviation    Wrist pronation    Wrist supination    (Blank rows = not  tested)   PATIENT EDUCATION:    HOME EXERCISE PROGRAM: Pt given HEP for LT shldr today with handout     PT Long Term Goals - 11/30/21 9449       PT LONG TERM GOAL #1   Title Patient will be independent with his HEP.    Time 6    Period Weeks    Status Achieved   Target Date 11/29/21      PT LONG TERM GOAL #2   Title Patient will improve his TUG time to 14 seconds or less for improved safety and to reduce his fall risk.    Baseline 12.37 seconds    Time 6    Period Weeks    Status Achieved    Target Date 11/29/21      PT LONG TERM GOAL #3   Title Patient will be able to improve his gait speed to at least 0.8 m/s for improved household and community mobility.    Baseline 0.79 m/s; 12/19/21: 0.79 m/s   Time 6    Period Weeks    Status On-going    Target Date 11/29/21      PT LONG TERM GOAL #4   Title Patient will be able to demonstrate at least 120 degrees of left shoulder abduction for improved function reaching overhead.    Time 3    Period Weeks    Status Achieved (147 deg)   Target Date 12/14/21      PT LONG TERM GOAL #5   Title Patient will be able to complete his daily activities without is left shoulder pain exceeding 2/10.    Time 3    Period Weeks    Status Achieved   Target Date 12/14/21              Plan - 12/14/21 6759     Clinical Impression Statement Pt arrives for today's treatment session denying any pain.  Pt and wife reports that pt is feeling good and moving better and ready to discharge today.  Pt has met all goals set forth for him at this time except gait speed, which he missed by .01 m/s.  Pt requiring intermittent UE support with side-stepping on Airex balance beam as well as during SLS.  All pt's questions encouraged and answered.  Pt instructed to call the facility with any issues, questions, or concerns.  Pt denied any pain at completion  of today's treatment session.    Personal Factors and Comorbidities Comorbidity 3+;Time since  onset of injury/illness/exacerbation;Age;Other    Comorbidities HTN, DM, neuropathy, CKD,    Examination-Activity Limitations Locomotion Level;Transfers;Lift;Stand;Stairs;Dressing    Examination-Participation Restrictions Yard Work;Cleaning;Community Activity    Stability/Clinical Decision Making Evolving/Moderate complexity    Rehab Potential Fair    PT Frequency 2x / week    PT Duration 3 weeks    PT Treatment/Interventions ADLs/Self Care Home Management;Neuromuscular re-education;Balance training;Therapeutic exercise;Therapeutic activities;Functional mobility training;Gait training;Stair training;Patient/family education;Cryotherapy;Electrical Stimulation;Moist Heat;Passive range of motion;Manual techniques;Taping;Vasopneumatic Device    PT Next Visit Plan DC to HEP              Eda Paschal, PTA 12/19/21 10:07 AM   PHYSICAL THERAPY DISCHARGE SUMMARY  Visits from Start of Care: 16  Current functional level related to goals / functional outcomes: Patient was able to meet almost all of his goals for physical therapy excluding his gait speed. However, this was within 0.01 m/s of his goal. He reported feeling comfortable being discharged to his HEP at this time.    Remaining deficits: Gait speed   Education / Equipment: HEP   Patient agrees to discharge. Patient goals were partially met. Patient is being discharged due to being pleased with the current functional level.     Jacqulynn Cadet, PT, DPT

## 2021-12-20 ENCOUNTER — Other Ambulatory Visit: Payer: Self-pay | Admitting: Family Medicine

## 2021-12-20 DIAGNOSIS — K219 Gastro-esophageal reflux disease without esophagitis: Secondary | ICD-10-CM

## 2021-12-20 DIAGNOSIS — E1140 Type 2 diabetes mellitus with diabetic neuropathy, unspecified: Secondary | ICD-10-CM

## 2021-12-21 ENCOUNTER — Telehealth: Payer: Self-pay | Admitting: Family Medicine

## 2021-12-21 NOTE — Telephone Encounter (Signed)
**  [facility] After Hours/ Emergency Line Call**  Patient: Joe [name].  PCP: Baruch Gouty, FNP  Patient has reported missing his mealtime insulin this afternoon.  Approximately 1 hour prior to call, patient's BG >500.  He injected 5u of Humalog (not on his med list) and is is now in the 400s.  Calling for advice.  He is pushing oral fluids.  No delirium, CP, SOB reported.  Recommended that he proceed with another 2u of Humalog and his scheduled Tresiba injection and call back if BGs do not drop below 400.  MedHx is complicated by RVA4Q.  He is to resume normal insulin schedule tomorrow.  Red flags discussed.  Will forward to PCP.  Ishmail Mcmanamon M. Lajuana Ripple, DO

## 2021-12-24 DIAGNOSIS — E1122 Type 2 diabetes mellitus with diabetic chronic kidney disease: Secondary | ICD-10-CM | POA: Diagnosis not present

## 2022-01-01 DIAGNOSIS — M79676 Pain in unspecified toe(s): Secondary | ICD-10-CM | POA: Diagnosis not present

## 2022-01-01 DIAGNOSIS — E1142 Type 2 diabetes mellitus with diabetic polyneuropathy: Secondary | ICD-10-CM | POA: Diagnosis not present

## 2022-01-01 DIAGNOSIS — L84 Corns and callosities: Secondary | ICD-10-CM | POA: Diagnosis not present

## 2022-01-01 DIAGNOSIS — B351 Tinea unguium: Secondary | ICD-10-CM | POA: Diagnosis not present

## 2022-01-08 ENCOUNTER — Ambulatory Visit: Payer: Medicare PPO | Admitting: Family Medicine

## 2022-01-08 ENCOUNTER — Encounter: Payer: Self-pay | Admitting: Family Medicine

## 2022-01-08 VITALS — BP 127/65 | HR 60 | Temp 97.8°F | Ht 69.0 in | Wt 169.0 lb

## 2022-01-08 DIAGNOSIS — Z794 Long term (current) use of insulin: Secondary | ICD-10-CM

## 2022-01-08 DIAGNOSIS — E785 Hyperlipidemia, unspecified: Secondary | ICD-10-CM

## 2022-01-08 DIAGNOSIS — E1140 Type 2 diabetes mellitus with diabetic neuropathy, unspecified: Secondary | ICD-10-CM | POA: Diagnosis not present

## 2022-01-08 DIAGNOSIS — I152 Hypertension secondary to endocrine disorders: Secondary | ICD-10-CM

## 2022-01-08 DIAGNOSIS — S0081XA Abrasion of other part of head, initial encounter: Secondary | ICD-10-CM | POA: Diagnosis not present

## 2022-01-08 DIAGNOSIS — E1169 Type 2 diabetes mellitus with other specified complication: Secondary | ICD-10-CM

## 2022-01-08 DIAGNOSIS — E1122 Type 2 diabetes mellitus with diabetic chronic kidney disease: Secondary | ICD-10-CM | POA: Diagnosis not present

## 2022-01-08 DIAGNOSIS — N183 Chronic kidney disease, stage 3 unspecified: Secondary | ICD-10-CM | POA: Diagnosis not present

## 2022-01-08 DIAGNOSIS — D696 Thrombocytopenia, unspecified: Secondary | ICD-10-CM | POA: Diagnosis not present

## 2022-01-08 DIAGNOSIS — T148XXA Other injury of unspecified body region, initial encounter: Secondary | ICD-10-CM

## 2022-01-08 DIAGNOSIS — E1159 Type 2 diabetes mellitus with other circulatory complications: Secondary | ICD-10-CM

## 2022-01-08 LAB — BAYER DCA HB A1C WAIVED: HB A1C (BAYER DCA - WAIVED): 7.7 % — ABNORMAL HIGH (ref 4.8–5.6)

## 2022-01-08 NOTE — Progress Notes (Signed)
Subjective:  [name], male    DOB: 18-Mar-1941, 81 y.o.   MRN: [id number]  Patient Care Team: Baruch Gouty, FNP as PCP - General (Family Medicine) Irine Seal, MD (Urology) Gatha Mayer, MD (Gastroenterology) Minus Breeding, MD as Consulting Physician (Cardiology) Lavera Guise, [facility] (Pharmacist) Harlen Labs, MD as Referring Physician (Optometry) Blanca Friend Royce Macadamia, [facility] as Pharmacist (Family Medicine)   Chief Complaint:  Medical Management of Chronic Issues   HPI: Joe [name] is a 81 y.o. male presenting on 01/08/2022 for Medical Management of Chronic Issues   1. Type 2 diabetes mellitus with diabetic neuropathy, with long-term current use of insulin (HCC) Compliant with medications and tolerating well. Has had some high readings but reports this was after eating things he should not have. Denies hypoglycemic spells. Asymptomatic when hyperglycemic. No polyuria, polyphagia, or polydipsia. Had eye exam with Dr. Nicoletta Dress in April 2023, those records have been requested. On statin, ASA, and ARB therapy.   2. Hyperlipidemia associated with type 2 diabetes mellitus ([place]) On statin therapy and tolerating well. No reported myalgias. Does try to follow a healthy diet. Very active on a daily basis.   3. Hypertension associated with diabetes ([place]) Compliant with medications without associated side effects. Denies chest pain, headaches, dizziness, leg swelling, weakness, or confusion.   4. CKD stage 3 due to type 2 diabetes mellitus ([place]) Denies changes in urine output. No leg swelling, weakness, fatigue, or confusion.   5. Thrombocytopenia ([place]) Denies abnormal bleeding or bruising.      Relevant past medical, surgical, family, and social history reviewed and updated as indicated.  Allergies and medications reviewed and updated. Data reviewed: Chart in Epic.   Past Medical History:  Diagnosis Date   Adenomatous colon polyp    Arthritis    Cataract    Chest  pain at rest 05/26/2016   Diabetes mellitus without complication ([place])    Dyslipidemia    ESOPHAGITIS, REFLUX 07/09/2004   Qualifier: Diagnosis of  By: Hardin Negus CMA Deborra Medina), Stephanie     Hiatal hernia    HOH (hard of hearing)    has bilateral Hearing aids   Hypertension    Irritable bowel syndrome    Kidney stone 1970   Pneumonia    Tick bites    took 5 rounds of Doxycycline this summer    Past Surgical History:  Procedure Laterality Date   COLONOSCOPY     ESOPHAGEAL DILATION  1996   ESOPHAGEAL DILATION  2003   FLEXIBLE SIGMOIDOSCOPY     HEMICOLECTOMY  08/2005   Right   KNEE SURGERY Right    TONSILLECTOMY      Social History   Socioeconomic History   Marital status: Married    Spouse name: [name]   Number of children: 1   Years of education: 16   Highest education level: Bachelor's degree (e.g., BA, AB, BS)  Occupational History   Occupation: Retired Pharmacist, hospital  Tobacco Use   Smoking status: Former    Packs/day: 2.00    Types: Cigarettes    Start date: 06/03/1954    Quit date: 08/24/1984    Years since quitting: 37.4   Smokeless tobacco: Never   Tobacco comments:    Has not used to tobacco products for the past 20 years  Vaping Use   Vaping Use: Never used  Substance and Sexual Activity   Alcohol use: Yes    Alcohol/week: 7.0 standard drinks of alcohol  Types: 7 Standard drinks or equivalent per week    Comment: [place] at night when he sits on the deck   Drug use: No   Sexual activity: Yes    Partners: Female  Other Topics Concern   Not on file  Social History Narrative   Lives in [place] with wife, both retired teachers   1 son   3 caffeinated beverages daily   1 alcoholic beverage daily   Former smoker no current tobacco no drug use   Social Determinants of Radio broadcast assistant Strain: Low Risk  (04/04/2021)   Overall Financial Resource Strain (CARDIA)    Difficulty of Paying Living Expenses: Not hard at all  Food Insecurity: No Food  Insecurity (04/04/2021)   Hunger Vital Sign    Worried About Running Out of Food in the Last Year: Never true    [place] in the Last Year: Never true  Transportation Needs: No Transportation Needs (04/04/2021)   PRAPARE - Hydrologist (Medical): No    Lack of Transportation (Non-Medical): No  Physical Activity: Insufficiently Active (04/04/2021)   Exercise Vital Sign    Days of Exercise per Week: 3 days    Minutes of Exercise per Session: 20 min  Stress: No Stress Concern Present (04/04/2021)   [place]    Feeling of Stress : Not at all  Social Connections: Moderately Isolated (04/04/2021)   Social Connection and Isolation Panel [NHANES]    Frequency of Communication with Friends and Family: More than three times a week    Frequency of Social Gatherings with Friends and Family: Once a week    Attends Religious Services: Never    Marine scientist or Organizations: No    Attends Archivist Meetings: Never    Marital Status: Married  Human resources officer Violence: Not At Risk (04/04/2021)   Humiliation, Afraid, Rape, and Kick questionnaire    Fear of Current or Ex-Partner: No    Emotionally Abused: No    Physically Abused: No    Sexually Abused: No    Outpatient Encounter Medications as of 01/08/2022  Medication Sig   Accu-Chek Softclix Lancets lancets CHECK BLOOD SUGAR 4 TIMES A DAY OR AS DIRECTED   aspirin 81 MG tablet Take 81 mg by mouth daily.   Blood Glucose Monitoring Suppl w/Device KIT Test BS BID and PRN dx E11.9. Give test strips and lancets to match machine given   cholecalciferol (VITAMIN D) 1000 UNITS tablet Take 2,000 Units by mouth daily.   Cinnamon 500 MG TABS Take 1,000 mg by mouth daily.   Continuous Blood Gluc Receiver (FREESTYLE LIBRE 2 READER) DEVI Use to test blood sugars up to 6 times daily as directed. DX: E11.9   Continuous Blood Gluc Sensor  (FREESTYLE LIBRE 2 SENSOR) MISC USE AS DIRECTED UP TO SIX TIMES DAILY. CHANGE EVERY 14 DAYS   diclofenac Sodium (VOLTAREN) 1 % GEL Apply 2 g topically 4 (four) times daily.   diphenhydrAMINE (BENADRYL) 25 MG tablet Take 25 mg by mouth every 6 (six) hours as needed.   doxylamine, Sleep, (UNISOM) 25 MG tablet Take 25 mg by mouth at bedtime as needed for sleep.    esomeprazole (NEXIUM) 40 MG capsule TAKE (1) CAPSULE DAILY   glucose blood (ONETOUCH ULTRA) test strip Check blood sugar 4 times daily Dx E 11.40   insulin lispro (HUMALOG) 100 UNIT/ML injection Inject 10 Units into the  skin once.   Insulin Pen Needle (PEN NEEDLES) 32G X 5 MM MISC 1 Device by Does not apply route daily.   JANUVIA 100 MG tablet TAKE 1 TABLET DAILY   JARDIANCE 25 MG TABS tablet TAKE ONE TABLET BY MOUTH EVERY MORNING   MELATONIN PO Take by mouth.   metFORMIN (GLUCOPHAGE-XR) 500 MG 24 hr tablet TAKE 2 TABLETS 2 TIMES A DAY WITH MEALS   nystatin cream (MYCOSTATIN) Apply 1 Application topically 2 (two) times daily.   olmesartan (BENICAR) 20 MG tablet TAKE 1 TABLET ONCE DAILY   Probiotic Product (PROBIOTIC PO) Take 1 tablet by mouth daily.   simvastatin (ZOCOR) 40 MG tablet TAKE 1 TABLET ONCE DAILY IN THE EVENING   TRESIBA FLEXTOUCH 100 UNIT/ML FlexTouch Pen Inject 0.06-0.2 mLs (6-20 Units total) into the skin daily at 10 pm.   [DISCONTINUED] mupirocin ointment (BACTROBAN) 2 % Apply 1 application topically 2 (two) times daily.   [DISCONTINUED] tamsulosin (FLOMAX) 0.4 MG CAPS capsule Take 1 capsule (0.4 mg total) by mouth at bedtime.   [DISCONTINUED] triamcinolone cream (KENALOG) 0.1 % Apply 1 Application topically 2 (two) times daily.   No facility-administered encounter medications on file as of 01/08/2022.    Allergies  Allergen Reactions   Ace Inhibitors Cough   Trazamine [Trazodone & Diet Manage Prod] Other (See Comments)    nightmares    Review of Systems  Constitutional:  Negative for activity change, appetite  change, chills, diaphoresis, fatigue, fever and unexpected weight change.  HENT: Negative.    Eyes: Negative.  Negative for photophobia and visual disturbance.  Respiratory:  Negative for cough, chest tightness and shortness of breath.   Cardiovascular:  Negative for chest pain, palpitations and leg swelling.  Gastrointestinal:  Negative for abdominal pain, blood in stool, constipation, diarrhea, nausea and vomiting.  Endocrine: Negative.  Negative for polydipsia, polyphagia and polyuria.  Genitourinary:  Negative for decreased urine volume, difficulty urinating, dysuria, enuresis, flank pain, frequency and urgency.  Musculoskeletal:  Negative for arthralgias and myalgias.  Skin:  Positive for wound. Negative for color change.  Allergic/Immunologic: Negative.   Neurological:  Negative for dizziness, tremors, seizures, syncope, facial asymmetry, speech difficulty, weakness, light-headedness, numbness and headaches.  Hematological: Negative.   Psychiatric/Behavioral:  Negative for confusion, hallucinations, sleep disturbance and suicidal ideas.   All other systems reviewed and are negative.       Objective:  BP 127/65   Pulse 60   Temp 97.8 F (36.6 C)   Ht 5' 9" (1.753 m)   Wt 169 lb (76.7 kg)   SpO2 98%   BMI 24.96 kg/m    Wt Readings from Last 3 Encounters:  01/08/22 169 lb (76.7 kg)  12/06/21 162 lb (73.5 kg)  11/16/21 168 lb (76.2 kg)    Physical Exam Vitals and nursing note reviewed.  Constitutional:      General: He is not in acute distress.    Appearance: Normal appearance. He is well-developed, well-groomed and normal weight. He is not ill-appearing, toxic-appearing or diaphoretic.  HENT:     Head: Normocephalic and atraumatic.     Jaw: There is normal jaw occlusion.     Right Ear: Decreased hearing noted.     Left Ear: Decreased hearing noted.     Ears:     Comments: Bilateral hearing aids    Nose: Nose normal.     Mouth/Throat:     Lips: Pink.     Mouth:  Mucous membranes are moist.  Pharynx: Oropharynx is clear. Uvula midline.  Eyes:     General: Lids are normal.     Extraocular Movements: Extraocular movements intact.     Conjunctiva/sclera: Conjunctivae normal.     Pupils: Pupils are equal, round, and reactive to light.  Neck:     Thyroid: No thyroid mass, thyromegaly or thyroid tenderness.     Vascular: No JVD.     Trachea: Trachea and phonation normal.  Cardiovascular:     Rate and Rhythm: Normal rate and regular rhythm.     Chest Wall: PMI is not displaced.     Pulses: Normal pulses.     Heart sounds: Normal heart sounds. No murmur heard.    No friction rub. No gallop.  Pulmonary:     Effort: Pulmonary effort is normal. No respiratory distress.     Breath sounds: Normal breath sounds. No wheezing.  Abdominal:     General: Bowel sounds are normal. There is no distension or abdominal bruit.     Palpations: Abdomen is soft. There is no hepatomegaly or splenomegaly.     Tenderness: There is no abdominal tenderness. There is no right CVA tenderness or left CVA tenderness.     Hernia: No hernia is present.  Musculoskeletal:        General: Normal range of motion.     Cervical back: Normal range of motion and neck supple.     Right lower leg: No edema.     Left lower leg: No edema.  Skin:    General: Skin is warm and dry.     Capillary Refill: Capillary refill takes less than 2 seconds.     Coloration: Skin is not cyanotic, jaundiced or pale.     Findings: Abrasion (forehead) present. No rash.  Neurological:     General: No focal deficit present.     Mental Status: He is alert and oriented to person, place, and time.     Sensory: Sensation is intact.     Motor: Motor function is intact.     Coordination: Coordination is intact.     Gait: Gait is intact.     Deep Tendon Reflexes: Reflexes are normal and symmetric.  Psychiatric:        Attention and Perception: Attention and perception normal.        Mood and Affect: Mood  and affect normal.        Speech: Speech normal.        Behavior: Behavior normal. Behavior is cooperative.        Thought Content: Thought content normal.        Cognition and Memory: Cognition and memory normal.        Judgment: Judgment normal.     Results for orders placed or performed in visit on 10/09/21  Bayer DCA Hb A1c Waived  Result Value Ref Range   HB A1C (BAYER DCA - WAIVED) 8.5 (H) 4.8 - 5.6 %       Pertinent labs & imaging results that were available during my care of the patient were reviewed by me and considered in my medical decision making.  Assessment & Plan:  [place] was seen today for medical management of chronic issues.  Diagnoses and all orders for this visit:  Type 2 diabetes mellitus with diabetic neuropathy, with long-term current use of insulin (HCC) A1C 7.7, improved since last visit. Continue medications along with diet and exercise. Follow up in 3 months for reevaluation.  -     Bayer  DCA Hb A1c Waived -     CMP14+EGFR -     CBC with Differential/Platelet -     Thyroid Panel With TSH -     Microalbumin / creatinine urine ratio  Hyperlipidemia associated with type 2 diabetes mellitus ([place]) Not fasting today. Will check labs at next visit and adjust therapy if warranted.   Hypertension associated with diabetes ([place]) BP well controlled. Changes were not made in regimen today. Goal BP is 130/80. Pt aware to report any persistent high or low readings. DASH diet and exercise encouraged. Exercise at least 150 minutes per week and increase as tolerated. Goal BMI > 25. Stress management encouraged. Avoid nicotine and tobacco product use. Avoid excessive alcohol and NSAID's. Avoid more than 2000 mg of sodium daily. Medications as prescribed. Follow up as scheduled.  -     CMP14+EGFR -     CBC with Differential/Platelet -     Thyroid Panel With TSH -     Microalbumin / creatinine urine ratio  CKD stage 3 due to type 2 diabetes mellitus ([place]) Labs pending.  Renal function has been stable. No indications of decline on physical exam.  -     CMP14+EGFR -     CBC with Differential/Platelet  Thrombocytopenia (HCC) Labs pending.  -     CBC with Differential/Platelet  Abrasion Tetanus is up to date. Wound care discussed in detail.     Continue all other maintenance medications.  Follow up plan: Return in about 3 months (around 04/10/2022), or if symptoms worsen or fail to improve, for DM.   Continue healthy lifestyle choices, including diet (rich in fruits, vegetables, and lean proteins, and low in salt and simple carbohydrates) and exercise (at least 30 minutes of moderate physical activity daily).  Educational handout given for DM  The above assessment and management plan was discussed with the patient. The patient verbalized understanding of and has agreed to the management plan. Patient is aware to call the clinic if they develop any new symptoms or if symptoms persist or worsen. Patient is aware when to return to the clinic for a follow-up visit. Patient educated on when it is appropriate to go to the emergency department.   Monia Pouch, FNP-C [facility] [phone] ]

## 2022-01-08 NOTE — Patient Instructions (Signed)

## 2022-01-09 LAB — THYROID PANEL WITH TSH
Free Thyroxine Index: 1.9 (ref 1.2–4.9)
T3 Uptake Ratio: 31 % (ref 24–39)
T4, Total: 6.1 ug/dL (ref 4.5–12.0)
TSH: 2.61 u[IU]/mL (ref 0.450–4.500)

## 2022-01-09 LAB — CBC WITH DIFFERENTIAL/PLATELET
Basophils Absolute: 0.1 10*3/uL (ref 0.0–0.2)
Basos: 2 %
EOS (ABSOLUTE): 0.5 10*3/uL — ABNORMAL HIGH (ref 0.0–0.4)
Eos: 9 %
Hematocrit: 32.7 % — ABNORMAL LOW (ref 37.5–51.0)
Hemoglobin: 11.1 g/dL — ABNORMAL LOW (ref 13.0–17.7)
Immature Grans (Abs): 0 10*3/uL (ref 0.0–0.1)
Immature Granulocytes: 0 %
Lymphocytes Absolute: 0.8 10*3/uL (ref 0.7–3.1)
Lymphs: 16 %
MCH: 32 pg (ref 26.6–33.0)
MCHC: 33.9 g/dL (ref 31.5–35.7)
MCV: 94 fL (ref 79–97)
Monocytes Absolute: 0.4 10*3/uL (ref 0.1–0.9)
Monocytes: 8 %
Neutrophils Absolute: 3.5 10*3/uL (ref 1.4–7.0)
Neutrophils: 65 %
Platelets: 131 10*3/uL — ABNORMAL LOW (ref 150–450)
RBC: 3.47 x10E6/uL — ABNORMAL LOW (ref 4.14–5.80)
RDW: 12.5 % (ref 11.6–15.4)
WBC: 5.3 10*3/uL (ref 3.4–10.8)

## 2022-01-09 LAB — CMP14+EGFR
ALT: 14 IU/L (ref 0–44)
AST: 15 IU/L (ref 0–40)
Albumin/Globulin Ratio: 2.2 (ref 1.2–2.2)
Albumin: 4.1 g/dL (ref 3.8–4.8)
Alkaline Phosphatase: 55 IU/L (ref 44–121)
BUN/Creatinine Ratio: 17 (ref 10–24)
BUN: 17 mg/dL (ref 8–27)
Bilirubin Total: 0.5 mg/dL (ref 0.0–1.2)
CO2: 22 mmol/L (ref 20–29)
Calcium: 9.2 mg/dL (ref 8.6–10.2)
Chloride: 100 mmol/L (ref 96–106)
Creatinine, Ser: 1.02 mg/dL (ref 0.76–1.27)
Globulin, Total: 1.9 g/dL (ref 1.5–4.5)
Glucose: 211 mg/dL — ABNORMAL HIGH (ref 70–99)
Potassium: 5 mmol/L (ref 3.5–5.2)
Sodium: 136 mmol/L (ref 134–144)
Total Protein: 6 g/dL (ref 6.0–8.5)
eGFR: 74 mL/min/{1.73_m2} (ref >59)

## 2022-01-09 LAB — MICROALBUMIN / CREATININE URINE RATIO
Creatinine, Urine: 28.7 mg/dL
Microalb/Creat Ratio: 10 mg/g creat (ref 0–29)
Microalbumin, Urine: 3 ug/mL

## 2022-01-11 DIAGNOSIS — S161XXA Strain of muscle, fascia and tendon at neck level, initial encounter: Secondary | ICD-10-CM | POA: Diagnosis not present

## 2022-01-22 ENCOUNTER — Encounter: Payer: Self-pay | Admitting: Family Medicine

## 2022-01-22 ENCOUNTER — Ambulatory Visit: Payer: Medicare PPO | Admitting: Family Medicine

## 2022-01-22 VITALS — BP 125/63 | HR 67 | Temp 98.4°F | Ht 69.0 in | Wt 163.0 lb

## 2022-01-22 DIAGNOSIS — U071 COVID-19: Secondary | ICD-10-CM | POA: Diagnosis not present

## 2022-01-22 DIAGNOSIS — J069 Acute upper respiratory infection, unspecified: Secondary | ICD-10-CM

## 2022-01-22 MED ORDER — CHLORPHEN-PE-ACETAMINOPHEN 4-10-325 MG PO TABS: 1.0000 | ORAL_TABLET | Freq: Four times a day (QID) | ORAL | 0 refills | Status: DC | PRN

## 2022-01-22 NOTE — Patient Instructions (Signed)
It appears that you have a viral upper respiratory infection (cold).  Cold symptoms can last up to 2 weeks.   ° °- Get plenty of rest and drink plenty of fluids. °- Try to breathe moist air. Use a cold mist humidifier. °- Consume warm fluids (soup or tea) to provide relief for a stuffy nose and to loosen phlegm. °- For cough and congestion you can use plain Mucinex, regular or max strength, follow box directions.  °- For nasal stuffiness, try saline nasal spray or a Neti Pot. You can use saline nasal spray 4 times daily. Do not use tap water in the Neti Pot, follow instructions on box for proper use. Afrin nasal spray can also be used but this product should not be used longer than 3 days or it will cause rebound nasal stuffiness (worsening nasal congestion). °- For sore throat pain relief: use chloraseptic spray, suck on throat lozenges, hard candy or popsicles; gargle with warm salt water (1/4 tsp. salt per 8 oz. of water); and eat soft, bland foods. °- For fever or aches and pains take tylenol or motrin as appropriate for age and weight.  °- Eat a well-balanced diet. If you cannot, ensure you are getting enough nutrients by taking a daily multivitamin. °- Avoid dairy products, as they can thicken phlegm. °- Avoid alcohol, as it impairs your body’s immune system. °- Change your toothbrush in 3 days.  ° °CONTACT YOUR DOCTOR IF YOU EXPERIENCE ANY OF THE FOLLOWING: °- High fever °- Ear pain °- Sinus-type headache °- Unusually severe cold symptoms °- Cough that gets worse while other cold symptoms improve °- Flare up of any chronic lung problem, such as asthma or COPD °- Your symptoms persist longer than 2 weeks ° °

## 2022-01-22 NOTE — Progress Notes (Signed)
Subjective:  [name], male    DOB: December 07, 1940, 81 y.o.   MRN: [id number]  Patient Care Team: Baruch Gouty, FNP as PCP - General (Family Medicine) Irine Seal, MD (Urology) Gatha Mayer, MD (Gastroenterology) Minus Breeding, MD as Consulting Physician (Cardiology) Lavera Guise, [facility] (Pharmacist) Harlen Labs, MD as Referring Physician (Optometry) Blanca Friend Royce Macadamia, [facility] as Pharmacist (Family Medicine)   Chief Complaint:  Sinus Problem   HPI: Joe [name] is a 81 y.o. male presenting on 01/22/2022 for Sinus Problem   Sinus Problem This is a new problem. The current episode started in the past 7 days. The problem has been waxing and waning since onset. There has been no fever. His pain is at a severity of 5/10. The pain is moderate. Associated symptoms include chills, congestion, coughing, ear pain, headaches and sinus pressure. Pertinent negatives include no diaphoresis, hoarse voice, neck pain, shortness of breath, sneezing, sore throat or swollen glands. Past treatments include oral decongestants. The treatment provided no relief.      Relevant past medical, surgical, family, and social history reviewed and updated as indicated.  Allergies and medications reviewed and updated. Data reviewed: Chart in Epic.   Past Medical History:  Diagnosis Date   Adenomatous colon polyp    Arthritis    Cataract    Chest pain at rest 05/26/2016   Diabetes mellitus without complication ([place])    Dyslipidemia    ESOPHAGITIS, REFLUX 07/09/2004   Qualifier: Diagnosis of  By: Hardin Negus CMA Deborra Medina), Stephanie     Hiatal hernia    HOH (hard of hearing)    has bilateral Hearing aids   Hypertension    Irritable bowel syndrome    Kidney stone 1970   Pneumonia    Tick bites    took 5 rounds of Doxycycline this summer    Past Surgical History:  Procedure Laterality Date   COLONOSCOPY     ESOPHAGEAL DILATION  1996   ESOPHAGEAL DILATION  2003   FLEXIBLE  SIGMOIDOSCOPY     HEMICOLECTOMY  08/2005   Right   KNEE SURGERY Right    TONSILLECTOMY      Social History   Socioeconomic History   Marital status: Married    Spouse name: [name]   Number of children: 1   Years of education: 16   Highest education level: Bachelor's degree (e.g., BA, AB, BS)  Occupational History   Occupation: Retired Pharmacist, hospital  Tobacco Use   Smoking status: Former    Packs/day: 2.00    Types: Cigarettes    Start date: 06/03/1954    Quit date: 08/24/1984    Years since quitting: 37.4   Smokeless tobacco: Never   Tobacco comments:    Has not used to tobacco products for the past 20 years  Vaping Use   Vaping Use: Never used  Substance and Sexual Activity   Alcohol use: Yes    Alcohol/week: 7.0 standard drinks of alcohol    Types: 7 Standard drinks or equivalent per week    Comment: [place]bon at night when he sits on the deck   Drug use: No   Sexual activity: Yes    Partners: Female  Other Topics Concern   Not on file  Social History Narrative   Lives in [place] with wife, both retired teachers   1 son   3 caffeinated beverages daily   1 alcoholic beverage daily   Former smoker no current tobacco no  drug use   Social Determinants of Health   Financial Resource Strain: Low Risk  (04/04/2021)   Overall Financial Resource Strain (CARDIA)    Difficulty of Paying Living Expenses: Not hard at all  Food Insecurity: No Food Insecurity (04/04/2021)   Hunger Vital Sign    Worried About Running Out of Food in the Last Year: Never true    Ran Out of Food in the Last Year: Never true  Transportation Needs: No Transportation Needs (04/04/2021)   PRAPARE - Hydrologist (Medical): No    Lack of Transportation (Non-Medical): No  Physical Activity: Insufficiently Active (04/04/2021)   Exercise Vital Sign    Days of Exercise per Week: 3 days    Minutes of Exercise per Session: 20 min  Stress: No Stress Concern Present (04/04/2021)    [place]    Feeling of Stress : Not at all  Social Connections: Moderately Isolated (04/04/2021)   Social Connection and Isolation Panel [NHANES]    Frequency of Communication with Friends and Family: More than three times a week    Frequency of Social Gatherings with Friends and Family: Once a week    Attends Religious Services: Never    Marine scientist or Organizations: No    Attends Archivist Meetings: Never    Marital Status: Married  Human resources officer Violence: Not At Risk (04/04/2021)   Humiliation, Afraid, Rape, and Kick questionnaire    Fear of Current or Ex-Partner: No    Emotionally Abused: No    Physically Abused: No    Sexually Abused: No    Outpatient Encounter Medications as of 01/22/2022  Medication Sig   Chlorphen-PE-Acetaminophen 4-10-325 MG TABS Take 1 tablet by mouth every 6 (six) hours as needed.   Accu-Chek Softclix Lancets lancets CHECK BLOOD SUGAR 4 TIMES A DAY OR AS DIRECTED   aspirin 81 MG tablet Take 81 mg by mouth daily.   Blood Glucose Monitoring Suppl w/Device KIT Test BS BID and PRN dx E11.9. Give test strips and lancets to match machine given   cholecalciferol (VITAMIN D) 1000 UNITS tablet Take 2,000 Units by mouth daily.   Cinnamon 500 MG TABS Take 1,000 mg by mouth daily.   Continuous Blood Gluc Receiver (FREESTYLE LIBRE 2 READER) DEVI Use to test blood sugars up to 6 times daily as directed. DX: E11.9   Continuous Blood Gluc Sensor (FREESTYLE LIBRE 2 SENSOR) MISC USE AS DIRECTED UP TO SIX TIMES DAILY. CHANGE EVERY 14 DAYS   diclofenac Sodium (VOLTAREN) 1 % GEL Apply 2 g topically 4 (four) times daily.   diphenhydrAMINE (BENADRYL) 25 MG tablet Take 25 mg by mouth every 6 (six) hours as needed.   doxylamine, Sleep, (UNISOM) 25 MG tablet Take 25 mg by mouth at bedtime as needed for sleep.    esomeprazole (NEXIUM) 40 MG capsule TAKE (1) CAPSULE DAILY   glucose blood  (ONETOUCH ULTRA) test strip Check blood sugar 4 times daily Dx E 11.40   insulin lispro (HUMALOG) 100 UNIT/ML injection Inject 10 Units into the skin once.   Insulin Pen Needle (PEN NEEDLES) 32G X 5 MM MISC 1 Device by Does not apply route daily.   JANUVIA 100 MG tablet TAKE 1 TABLET DAILY   JARDIANCE 25 MG TABS tablet TAKE ONE TABLET BY MOUTH EVERY MORNING   MELATONIN PO Take by mouth.   metFORMIN (GLUCOPHAGE-XR) 500 MG 24 hr tablet TAKE 2 TABLETS 2  TIMES A DAY WITH MEALS   Multiple Vitamin (MULTI VITAMIN) TABS 1 tablet Orally Once a day for 30 day(s)   nystatin cream (MYCOSTATIN) Apply 1 Application topically 2 (two) times daily.   olmesartan (BENICAR) 20 MG tablet TAKE 1 TABLET ONCE DAILY   Probiotic Product (PROBIOTIC PO) Take 1 tablet by mouth daily.   simvastatin (ZOCOR) 40 MG tablet TAKE 1 TABLET ONCE DAILY IN THE EVENING   TRESIBA FLEXTOUCH 100 UNIT/ML FlexTouch Pen Inject 0.06-0.2 mLs (6-20 Units total) into the skin daily at 10 pm.   No facility-administered encounter medications on file as of 01/22/2022.    Allergies  Allergen Reactions   Ace Inhibitors Cough   Trazamine [Trazodone & Diet Manage Prod] Other (See Comments)    nightmares    Review of Systems  Constitutional:  Positive for activity change, chills and fatigue. Negative for appetite change, diaphoresis, fever and unexpected weight change.  HENT:  Positive for congestion, ear pain, postnasal drip, rhinorrhea, sinus pressure and sinus pain. Negative for dental problem, drooling, ear discharge, facial swelling, hearing loss, hoarse voice, mouth sores, nosebleeds, sneezing, sore throat, tinnitus, trouble swallowing and voice change.   Eyes:  Negative for photophobia and visual disturbance.  Respiratory:  Positive for cough. Negative for apnea, choking, chest tightness, shortness of breath, wheezing and stridor.   Cardiovascular:  Negative for chest pain, palpitations and leg swelling.  Gastrointestinal:  Negative for  abdominal pain.  Genitourinary:  Negative for decreased urine volume and difficulty urinating.  Musculoskeletal:  Positive for myalgias. Negative for arthralgias and neck pain.  Neurological:  Positive for headaches. Negative for dizziness, tremors, seizures, syncope, facial asymmetry, speech difficulty, weakness, light-headedness and numbness.  Psychiatric/Behavioral:  Negative for confusion.   All other systems reviewed and are negative.       Objective:  BP 125/63   Pulse 67   Temp 98.4 F (36.9 C)   Ht 5' 9"  (1.753 m)   Wt 163 lb (73.9 kg)   SpO2 99%   BMI 24.07 kg/m    Wt Readings from Last 3 Encounters:  01/22/22 163 lb (73.9 kg)  01/08/22 169 lb (76.7 kg)  12/06/21 162 lb (73.5 kg)    Physical Exam Vitals and nursing note reviewed.  Constitutional:      General: He is not in acute distress.    Appearance: Normal appearance. He is normal weight. He is not ill-appearing, toxic-appearing or diaphoretic.  HENT:     Head: Normocephalic and atraumatic.     Right Ear: Ear canal and external ear normal. Decreased hearing noted. A middle ear effusion is present. Tympanic membrane is not erythematous.     Left Ear: Ear canal and external ear normal. Decreased hearing noted. A middle ear effusion is present. Tympanic membrane is not erythematous.     Nose: Nose normal.     Mouth/Throat:     Lips: Pink. No lesions.     Mouth: Mucous membranes are moist.     Pharynx: Posterior oropharyngeal erythema present. No pharyngeal swelling, oropharyngeal exudate or uvula swelling.     Tonsils: No tonsillar exudate or tonsillar abscesses.  Eyes:     Conjunctiva/sclera: Conjunctivae normal.     Pupils: Pupils are equal, round, and reactive to light.  Cardiovascular:     Rate and Rhythm: Normal rate and regular rhythm.     Heart sounds: Normal heart sounds.  Pulmonary:     Effort: Pulmonary effort is normal.     Breath sounds: Normal breath  sounds.  Musculoskeletal:     Cervical  back: Normal range of motion and neck supple.     Right lower leg: No edema.     Left lower leg: No edema.  Lymphadenopathy:     Cervical: No cervical adenopathy.  Skin:    General: Skin is warm and dry.     Capillary Refill: Capillary refill takes less than 2 seconds.  Neurological:     General: No focal deficit present.     Mental Status: He is alert and oriented to person, place, and time.  Psychiatric:        Mood and Affect: Mood normal.        Behavior: Behavior normal. Behavior is cooperative.        Thought Content: Thought content normal.        Judgment: Judgment normal.     Results for orders placed or performed in visit on 01/08/22  Bayer DCA Hb A1c Waived  Result Value Ref Range   HB A1C (BAYER DCA - WAIVED) 7.7 (H) 4.8 - 5.6 %  CMP14+EGFR  Result Value Ref Range   Glucose 211 (H) 70 - 99 mg/dL   BUN 17 8 - 27 mg/dL   Creatinine, Ser 1.02 0.76 - 1.27 mg/dL   eGFR 74 >59 mL/min/1.73   BUN/Creatinine Ratio 17 10 - 24   Sodium 136 134 - 144 mmol/L   Potassium 5.0 3.5 - 5.2 mmol/L   Chloride 100 96 - 106 mmol/L   CO2 22 20 - 29 mmol/L   Calcium 9.2 8.6 - 10.2 mg/dL   Total Protein 6.0 6.0 - 8.5 g/dL   Albumin 4.1 3.8 - 4.8 g/dL   Globulin, Total 1.9 1.5 - 4.5 g/dL   Albumin/Globulin Ratio 2.2 1.2 - 2.2   Bilirubin Total 0.5 0.0 - 1.2 mg/dL   Alkaline Phosphatase 55 44 - 121 IU/L   AST 15 0 - 40 IU/L   ALT 14 0 - 44 IU/L  CBC with Differential/Platelet  Result Value Ref Range   WBC 5.3 3.4 - 10.8 x10E3/uL   RBC 3.47 (L) 4.14 - 5.80 x10E6/uL   Hemoglobin 11.1 (L) 13.0 - 17.7 g/dL   Hematocrit 32.7 (L) 37.5 - 51.0 %   MCV 94 79 - 97 fL   MCH 32.0 26.6 - 33.0 pg   MCHC 33.9 31.5 - 35.7 g/dL   RDW 12.5 11.6 - 15.4 %   Platelets 131 (L) 150 - 450 x10E3/uL   Neutrophils 65 Not Estab. %   Lymphs 16 Not Estab. %   Monocytes 8 Not Estab. %   Eos 9 Not Estab. %   Basos 2 Not Estab. %   Neutrophils Absolute 3.5 1.4 - 7.0 x10E3/uL   Lymphocytes Absolute 0.8 0.7  - 3.1 x10E3/uL   Monocytes Absolute 0.4 0.1 - 0.9 x10E3/uL   EOS (ABSOLUTE) 0.5 (H) 0.0 - 0.4 x10E3/uL   Basophils Absolute 0.1 0.0 - 0.2 x10E3/uL   Immature Granulocytes 0 Not Estab. %   Immature Grans (Abs) 0.0 0.0 - 0.1 x10E3/uL  Thyroid Panel With TSH  Result Value Ref Range   TSH 2.610 0.450 - 4.500 uIU/mL   T4, Total 6.1 4.5 - 12.0 ug/dL   T3 Uptake Ratio 31 24 - 39 %   Free Thyroxine Index 1.9 1.2 - 4.9  Microalbumin / creatinine urine ratio  Result Value Ref Range   Creatinine, Urine 28.7 Not Estab. mg/dL   Microalbumin, Urine 3.0 Not Estab. ug/mL   Microalb/Creat  Ratio 10 0 - 29 mg/g creat       Pertinent labs & imaging results that were available during my care of the patient were reviewed by me and considered in my medical decision making.  Assessment & Plan:  Romir was seen today for sinus problem.  Diagnoses and all orders for this visit:  URI with cough and congestion No indications of acute bacterial infection. Swabbed for influenza and COVID, antiviral therapy if warranted. Norel as prescribed for symptomatic care. Report new, worsening, or persistent symptoms.  -     Novel Coronavirus, NAA ([facility]) -     Chlorphen-PE-Acetaminophen 4-10-325 MG TABS; Take 1 tablet by mouth every 6 (six) hours as needed.     Continue all other maintenance medications.  Follow up plan: Return if symptoms worsen or fail to improve.   Continue healthy lifestyle choices, including diet (rich in fruits, vegetables, and lean proteins, and low in salt and simple carbohydrates) and exercise (at least 30 minutes of moderate physical activity daily).  Educational handout given for URI  The above assessment and management plan was discussed with the patient. The patient verbalized understanding of and has agreed to the management plan. Patient is aware to call the clinic if they develop any new symptoms or if symptoms persist or worsen. Patient is aware when to return to the clinic for  a follow-up visit. Patient educated on when it is appropriate to go to the emergency department.   Monia Pouch, FNP-C [facility] [phone]

## 2022-01-23 ENCOUNTER — Telehealth: Payer: Self-pay | Admitting: Family Medicine

## 2022-01-23 LAB — NOVEL CORONAVIRUS, NAA: SARS-CoV-2, NAA: DETECTED — AB

## 2022-01-23 MED ORDER — MOLNUPIRAVIR EUA 200MG CAPSULE: 4.0000 | ORAL_CAPSULE | Freq: Two times a day (BID) | ORAL | 0 refills | Status: AC

## 2022-01-23 NOTE — Addendum Note (Signed)
Addended by: Baruch Gouty on: 01/23/2022 04:20 PM   Modules accepted: Orders

## 2022-01-23 NOTE — Telephone Encounter (Signed)
Pt wife aware ok to take

## 2022-01-24 ENCOUNTER — Emergency Department (HOSPITAL_COMMUNITY): Payer: Medicare PPO

## 2022-01-24 ENCOUNTER — Other Ambulatory Visit: Payer: Self-pay

## 2022-01-24 ENCOUNTER — Emergency Department (HOSPITAL_COMMUNITY)
Admission: EM | Admit: 2022-01-24 | Discharge: 2022-01-24 | Disposition: A | Discharge status: Home / Self Care | Payer: Medicare PPO | Attending: Emergency Medicine | Admitting: Emergency Medicine

## 2022-01-24 ENCOUNTER — Encounter (HOSPITAL_COMMUNITY): Payer: Self-pay | Admitting: Emergency Medicine

## 2022-01-24 ENCOUNTER — Telehealth: Payer: Self-pay | Admitting: Family Medicine

## 2022-01-24 DIAGNOSIS — Z794 Long term (current) use of insulin: Secondary | ICD-10-CM | POA: Diagnosis not present

## 2022-01-24 DIAGNOSIS — I10 Essential (primary) hypertension: Secondary | ICD-10-CM | POA: Insufficient documentation

## 2022-01-24 DIAGNOSIS — Z7984 Long term (current) use of oral hypoglycemic drugs: Secondary | ICD-10-CM | POA: Insufficient documentation

## 2022-01-24 DIAGNOSIS — E119 Type 2 diabetes mellitus without complications: Secondary | ICD-10-CM | POA: Diagnosis not present

## 2022-01-24 DIAGNOSIS — R001 Bradycardia, unspecified: Secondary | ICD-10-CM | POA: Diagnosis not present

## 2022-01-24 DIAGNOSIS — Z7982 Long term (current) use of aspirin: Secondary | ICD-10-CM | POA: Diagnosis not present

## 2022-01-24 DIAGNOSIS — Z79899 Other long term (current) drug therapy: Secondary | ICD-10-CM | POA: Insufficient documentation

## 2022-01-24 DIAGNOSIS — R079 Chest pain, unspecified: Secondary | ICD-10-CM | POA: Diagnosis not present

## 2022-01-24 DIAGNOSIS — R0781 Pleurodynia: Secondary | ICD-10-CM | POA: Insufficient documentation

## 2022-01-24 DIAGNOSIS — R091 Pleurisy: Secondary | ICD-10-CM | POA: Diagnosis not present

## 2022-01-24 DIAGNOSIS — U071 COVID-19: Secondary | ICD-10-CM | POA: Diagnosis not present

## 2022-01-24 DIAGNOSIS — R059 Cough, unspecified: Secondary | ICD-10-CM | POA: Diagnosis present

## 2022-01-24 LAB — CBC
HCT: 33.1 % — ABNORMAL LOW (ref 39.0–52.0)
Hemoglobin: 11.3 g/dL — ABNORMAL LOW (ref 13.0–17.0)
MCH: 31.7 pg (ref 26.0–34.0)
MCHC: 34.1 g/dL (ref 30.0–36.0)
MCV: 92.7 fL (ref 80.0–100.0)
Platelets: 111 10*3/uL — ABNORMAL LOW (ref 150–400)
RBC: 3.57 MIL/uL — ABNORMAL LOW (ref 4.22–5.81)
RDW: 12.3 % (ref 11.5–15.5)
WBC: 5.1 10*3/uL (ref 4.0–10.5)
nRBC: 0 % (ref 0.0–0.2)

## 2022-01-24 LAB — BASIC METABOLIC PANEL
Anion gap: 7 (ref 5–15)
BUN: 27 mg/dL — ABNORMAL HIGH (ref 8–23)
CO2: 22 mmol/L (ref 22–32)
Calcium: 8.5 mg/dL — ABNORMAL LOW (ref 8.9–10.3)
Chloride: 104 mmol/L (ref 98–111)
Creatinine, Ser: 1.17 mg/dL (ref 0.61–1.24)
GFR, Estimated: >60 mL/min (ref >60)
Glucose, Bld: 179 mg/dL — ABNORMAL HIGH (ref 70–99)
Potassium: 4.9 mmol/L (ref 3.5–5.1)
Sodium: 133 mmol/L — ABNORMAL LOW (ref 135–145)

## 2022-01-24 LAB — TROPONIN I (HIGH SENSITIVITY)
Troponin I (High Sensitivity): 5 ng/L (ref <18)
Troponin I (High Sensitivity): 5 ng/L (ref <18)

## 2022-01-24 MED ORDER — IOHEXOL 350 MG/ML SOLN
100.0000 mL | Freq: Once | INTRAVENOUS | Status: AC | PRN
  Administered 2022-01-24: 100 mL via INTRAVENOUS

## 2022-01-24 NOTE — Telephone Encounter (Signed)
Patients wife called in stating that he was having chest pain. She said he would rate the pain a 3/10. I spoke with Monia Pouch, FNP and she recommends that patient be evaluated by the ER especially due to his age and health problems. Patient did test positive for COVID on Tuesday.

## 2022-01-24 NOTE — ED Provider Notes (Signed)
[facility] Provider Note   CSN: [id number] Arrival date & time: 01/24/22  1032     History  Chief Complaint  Patient presents with   Chest Pain    Joe [name] is a 81 y.o. male.  Joe [name] is a 81 y.o. male with a history of diabetes, hypertension, hyperlipidemia, GERD, IBS, who presents to the emergency department for evaluation of chest pain starting this morning.  Was diagnosed COVID-positive on 8/23. Patient reports chest pain that is worse with inspiration, has been constant since this morning.  Reports his chest pain is central.  Pain not worse with exertion.  No radiation of pain.  No lightheadedness, syncope or diaphoresis.  Reports some associated cough and shortness of breath.  COVID symptoms started few days prior with chills, cough, congestion, generalized fatigue.  No nausea, vomiting or abdominal pain.  Not on blood thinners.  No prior history of PE.  PCP has already prescribed COVID antivirals  The history is provided by the patient and the spouse.  Chest Pain Associated symptoms: cough, fatigue and shortness of breath   Associated symptoms: no fever and no headache        Home Medications Prior to Admission medications   Medication Sig Start Date End Date Taking? Authorizing Provider  Accu-Chek Softclix Lancets lancets CHECK BLOOD SUGAR 4 TIMES A DAY OR AS DIRECTED 06/18/19   [provider]  aspirin 81 MG tablet Take 81 mg by mouth daily.    [provider]  Blood Glucose Monitoring Suppl w/Device KIT Test BS BID and PRN dx E11.9. Give test strips and lancets to match machine given 07/29/17   Chipper Herb, MD  Chlorphen-PE-Acetaminophen 4-10-325 MG TABS Take 1 tablet by mouth every 6 (six) hours as needed. 01/22/22   Baruch Gouty, FNP  cholecalciferol (VITAMIN D) 1000 UNITS tablet Take 2,000 Units by mouth daily.    [provider]  Cinnamon 500 MG TABS Take 1,000 mg by mouth daily.    [provider]  Continuous Blood Gluc Receiver (FREESTYLE LIBRE 2 READER) DEVI Use to test blood sugars up to 6 times daily as directed. DX: E11.9 12/17/19   Ronnie Doss M, DO  Continuous Blood Gluc Sensor (FREESTYLE LIBRE 2 SENSOR) MISC USE AS DIRECTED UP TO SIX TIMES DAILY. CHANGE EVERY 14 DAYS 10/03/21   Baruch Gouty, FNP  diclofenac Sodium (VOLTAREN) 1 % GEL Apply 2 g topically 4 (four) times daily. 04/23/21   Ivy Lynn, NP  diphenhydrAMINE (BENADRYL) 25 MG tablet Take 25 mg by mouth every 6 (six) hours as needed.    [provider]  doxylamine, Sleep, (UNISOM) 25 MG tablet Take 25 mg by mouth at bedtime as needed for sleep.     [provider]  esomeprazole (NEXIUM) 40 MG capsule TAKE (1) CAPSULE DAILY 12/20/21   Rakes, Connye Burkitt, FNP  glucose blood (ONETOUCH ULTRA) test strip Check blood sugar 4 times daily Dx E 11.40 04/27/19   Rakes, Connye Burkitt, FNP  insulin lispro (HUMALOG) 100 UNIT/ML injection Inject 10 Units into the skin once.    [provider]  Insulin Pen Needle (PEN NEEDLES) 32G X 5 MM MISC 1 Device by Does not apply route daily. 04/17/20   Janora Norlander, DO  JANUVIA 100 MG tablet TAKE 1 TABLET DAILY 12/05/21   Baruch Gouty, FNP  JARDIANCE 25 MG TABS tablet TAKE ONE TABLET BY MOUTH EVERY MORNING 12/20/21   Rakes,  Connye Burkitt, FNP  MELATONIN PO Take by mouth.    [provider]  metFORMIN (GLUCOPHAGE-XR) 500 MG 24 hr tablet TAKE 2 TABLETS 2 TIMES A DAY WITH MEALS 10/30/21   Rakes, Connye Burkitt, FNP  molnupiravir EUA (LAGEVRIO) 200 mg CAPS capsule Take 4 capsules (800 mg total) by mouth 2 (two) times daily for 5 days. 01/23/22 01/28/22  Baruch Gouty, FNP  Multiple Vitamin (MULTI VITAMIN) TABS 1 tablet Orally Once a day for 30 day(s)    [provider]  nystatin cream (MYCOSTATIN) Apply 1 Application topically 2 (two) times daily. 12/06/21   Baruch Gouty, FNP  olmesartan (BENICAR) 20 MG tablet TAKE 1 TABLET ONCE DAILY 10/08/21   Baruch Gouty, FNP   Probiotic Product (PROBIOTIC PO) Take 1 tablet by mouth daily.    [provider]  simvastatin (ZOCOR) 40 MG tablet TAKE 1 TABLET ONCE DAILY IN THE EVENING 10/08/21   Rakes, Connye Burkitt, FNP  TRESIBA FLEXTOUCH 100 UNIT/ML FlexTouch Pen Inject 0.06-0.2 mLs (6-20 Units total) into the skin daily at 10 pm. 07/23/21   Rakes, Connye Burkitt, FNP      Allergies    Ace inhibitors and Milderd Meager & diet manage prod]    Review of Systems   Review of Systems  Constitutional:  Positive for chills and fatigue. Negative for fever.  HENT:  Positive for congestion and rhinorrhea.   Respiratory:  Positive for cough and shortness of breath.   Cardiovascular:  Positive for chest pain.  Musculoskeletal:  Positive for myalgias. Negative for arthralgias.  Neurological:  Negative for syncope, light-headedness and headaches.    Physical Exam Updated Vital Signs BP 108/89   Pulse 61   Temp 97.9 F (36.6 C) (Oral)   Resp (!) 22   Ht _0  (1.753 m)   Wt 75.8 kg   SpO2 100%   BMI 24.66 kg/m  Physical Exam Vitals and nursing note reviewed.  Constitutional:      General: He is not in acute distress.    Appearance: He is well-developed. He is not ill-appearing or diaphoretic.  HENT:     Head: Normocephalic and atraumatic.     Nose: Congestion and rhinorrhea present.     Mouth/Throat:     Mouth: Mucous membranes are moist.     Pharynx: Oropharynx is clear.  Eyes:     General:        Right eye: No discharge.        Left eye: No discharge.  Neck:     Comments: No rigidity Cardiovascular:     Rate and Rhythm: Normal rate and regular rhythm.     Heart sounds: Normal heart sounds. No murmur heard.    No friction rub. No gallop.  Pulmonary:     Effort: Pulmonary effort is normal. No respiratory distress.     Breath sounds: Normal breath sounds.     Comments: Respirations equal and unlabored, patient able to speak in full sentences, lungs clear to auscultation bilaterally, occasional  cough Abdominal:     General: Bowel sounds are normal. There is no distension.     Palpations: Abdomen is soft. There is no mass.     Tenderness: There is no abdominal tenderness. There is no guarding.     Comments: Abdomen soft, nondistended, nontender to palpation in all quadrants without guarding or peritoneal signs  Musculoskeletal:        General: No deformity.     Cervical back: Neck supple.  Lymphadenopathy:     Cervical: No cervical adenopathy.  Skin:    General: Skin is warm and dry.     Capillary Refill: Capillary refill takes less than 2 seconds.  Neurological:     Mental Status: He is alert and oriented to person, place, and time.  Psychiatric:        Mood and Affect: Mood normal.        Behavior: Behavior normal.     ED Results / Procedures / Treatments   Labs (all labs ordered are listed, but only abnormal results are displayed) Labs Reviewed  BASIC METABOLIC PANEL - Abnormal; Notable for the following components:      Result Value   Sodium 133 (*)    Glucose, Bld 179 (*)    BUN 27 (*)    Calcium 8.5 (*)    All other components within normal limits  CBC - Abnormal; Notable for the following components:   RBC 3.57 (*)    Hemoglobin 11.3 (*)    HCT 33.1 (*)    Platelets 111 (*)    All other components within normal limits  TROPONIN I (HIGH SENSITIVITY)  TROPONIN I (HIGH SENSITIVITY)    EKG EKG Interpretation  Date/Time:  Thursday January 24 2022 10:59:53 EDT Ventricular Rate:  58 PR Interval:  182 QRS Duration: 122 QT Interval:  452 QTC Calculation: 443 R Axis:   30 Text Interpretation: Sinus bradycardia Right bundle branch block Abnormal ECG When compared with ECG of 26-May-2016 06:45, PREVIOUS ECG IS PRESENT Confirmed by Pattricia Boss [phone]) on 01/25/2022 3:50:42 PM  Radiology CT Angio Chest PE W and/or Wo Contrast  Result Date: 01/24/2022 CLINICAL DATA:  Chest pain. EXAM: CT ANGIOGRAPHY CHEST WITH CONTRAST TECHNIQUE: Multidetector CT imaging of  the chest was performed using the standard protocol during bolus administration of intravenous contrast. Multiplanar CT image reconstructions and MIPs were obtained to evaluate the vascular anatomy. RADIATION DOSE REDUCTION: This exam was performed according to the departmental dose-optimization program which includes automated exposure control, adjustment of the mA and/or kV according to patient size and/or use of iterative reconstruction technique. CONTRAST:  130m OMNIPAQUE IOHEXOL 350 MG/ML SOLN COMPARISON:  February 23, 2016. FINDINGS: Cardiovascular: Satisfactory opacification of the pulmonary arteries to the segmental level. No evidence of pulmonary embolism. Normal heart size. No pericardial effusion. Mediastinum/Nodes: No enlarged mediastinal, hilar, or axillary lymph nodes. Thyroid gland, trachea, and esophagus demonstrate no significant findings. Lungs/Pleura: Lungs are clear. No pleural effusion or pneumothorax. Upper Abdomen: No acute abnormality. Musculoskeletal: No chest wall abnormality. No acute or significant osseous findings. Review of the MIP images confirms the above findings. IMPRESSION: No definite evidence of pulmonary embolus. No acute abnormality is noted in the chest. Aortic Atherosclerosis (ICD10-I70.0). Electronically Signed   By: JMarijo ConceptionM.D.   On: 01/24/2022 14:35   DG Chest Portable 1 View  Result Date: 01/24/2022 CLINICAL DATA:  Chest pain EXAM: PORTABLE CHEST 1 VIEW COMPARISON:  05/26/2016 FINDINGS: The heart size and mediastinal contours are within normal limits. Both lungs are clear. The visualized skeletal structures are unremarkable. IMPRESSION: No active disease. Electronically Signed   By: PElmer PickerM.D.   On: 01/24/2022 11:47    Procedures Procedures    Medications Ordered in ED Medications  iohexol (OMNIPAQUE) 350 MG/ML injection 100 mL (100 mLs Intravenous Contrast Given 01/24/22 1406)    ED Course/ Medical Decision Making/ A&P  Medical Decision Making Amount and/or Complexity of Data Reviewed Labs: ordered. Radiology: ordered.  Risk Prescription drug management.   Patient presents to the emergency department with pleuritic chest pain in setting of recent COVID infection. Patient nontoxic appearing, in no apparent distress, vitals without significant abnormality. Fairly benign physical exam.   DDX including but not limited to: ACS, pulmonary embolism, dissection, pneumothorax, pneumonia, arrhythmia, severe anemia, MSK, GERD, anxiety, abdominal process .   Additional history obtained:  Chart & nursing note reviewed.  Patient at bedside to provide additional history  EKG: Sinus bradycardia with right bundle branch block, no significant change when compared to previous  Lab Tests:  I reviewed & interpreted labs including:  No leukocytosis, stable hemoglobin, glucose 179, sodium 133, no other significant electrolyte derangements, normal renal function, troponin negative x2  Imaging Studies ordered:  I ordered and viewed the following imaging, agree with radiologist impression:  Chest x-ray and CTA of the chest No evidence of pneumonia or pulmonary embolism on my viewing and interpretation of imaging.  ED Course:   RE-EVAL: Patient currently denies chest pain  EKG without obvious acute ischemia, delta troponin negative, doubt ACS.  Fortunately no evidence of PE or pneumonia in setting of COVID infection. Pain is not a tearing sensation, symmetric pulses, no widening of mediastinum on CXR, doubt dissection. Cardiac monitor reviewed, no notable arrhythmias or tachycardia   Based on patient's chief complaint, I considered admission might be necessary, however after reassuring ED workup feel patient is reasonable for discharge.  Recommend continued symptomatic treatment for COVID infection  I discussed results, treatment plan, need for PCP follow-up, and return precautions with the patient. Provided  opportunity for questions, patient confirmed understanding and is in agreement with plan.    Portions of this note were generated with [employer]. Dictation errors may occur despite best attempts at proofreading.         Final Clinical Impression(s) / ED Diagnoses Final diagnoses:  Pleuritic chest pain  COVID-19 virus infection    Rx / DC Orders ED Discharge Orders     None         Jacqlyn Larsen, [place] 02/09/22 1421    Milton Ferguson, MD 02/11/22 1122

## 2022-01-24 NOTE — ED Triage Notes (Signed)
Pt presents with chest pain that started this am, pt tested Covid + on 01/23/22.

## 2022-01-24 NOTE — Discharge Instructions (Addendum)
Your evaluation has been reassuring, scan does not show a blood clot or pneumonia and your EKG and heart enzymes do not suggest a heart attack.  Symptoms are likely due to musculoskeletal pain from coughing or inflammation from recent COVID infection.  Continue to treat symptoms supportively and discussed with your doctor if symptoms or not improving.  If you develop worsening chest pain, shortness of breath, fever, worsening or more productive cough return for reevaluation.

## 2022-01-29 ENCOUNTER — Encounter: Payer: Self-pay | Admitting: Family Medicine

## 2022-01-29 ENCOUNTER — Ambulatory Visit (INDEPENDENT_AMBULATORY_CARE_PROVIDER_SITE_OTHER): Payer: Medicare PPO

## 2022-01-29 ENCOUNTER — Ambulatory Visit: Payer: Medicare PPO | Admitting: Family Medicine

## 2022-01-29 VITALS — BP 109/59 | HR 66 | Temp 97.0°F | Ht 69.0 in | Wt 163.0 lb

## 2022-01-29 DIAGNOSIS — R079 Chest pain, unspecified: Secondary | ICD-10-CM

## 2022-01-29 DIAGNOSIS — U099 Post covid-19 condition, unspecified: Secondary | ICD-10-CM | POA: Diagnosis not present

## 2022-01-29 DIAGNOSIS — R091 Pleurisy: Secondary | ICD-10-CM | POA: Diagnosis not present

## 2022-01-29 LAB — D-DIMER, QUANTITATIVE: D-DIMER: 0.87 mg/L FEU — ABNORMAL HIGH (ref 0.00–0.49)

## 2022-01-29 MED ORDER — NAPROXEN 500 MG PO TABS: 500.0000 mg | ORAL_TABLET | Freq: Two times a day (BID) | ORAL | 0 refills | Status: AC

## 2022-01-29 MED ORDER — FAMOTIDINE 20 MG PO TABS: 20.0000 mg | ORAL_TABLET | Freq: Two times a day (BID) | ORAL | 0 refills | Status: DC

## 2022-01-29 NOTE — Progress Notes (Signed)
Subjective:  [name], male    DOB: 10/22/1940, 81 y.o.   MRN: [id number]  Patient Care Team: Baruch Gouty, FNP as PCP - General (Family Medicine) Irine Seal, MD (Urology) Gatha Mayer, MD (Gastroenterology) Minus Breeding, MD as Consulting Physician (Cardiology) Lavera Guise, [facility] (Pharmacist) Harlen Labs, MD as Referring Physician (Optometry) Blanca Friend Royce Macadamia, [facility] as Pharmacist (Family Medicine)   Chief Complaint:  post covid chest pain   HPI: Joe [name] is a 81 y.o. male presenting on 01/29/2022 for post covid chest pain   Pt presents today with complaints of chest pain after recent diagnosis of COVID. The pain is worse with deep breathing, movement, and cough. He did have some shortness of breath when started but this has since resolved. States the pain was worse this morning at 0430 but has progressively improved throughout the day. No associated nausea, vomiting, diaphoresis, palpitations, weakness, or syncope. He was seen in the ED 01/24/2022 and complete workup was negative including CTPA. He was discharged home with pleurisy diagnosis, no medications prescribed.   Chest Pain  This is a new problem. The current episode started in the past 7 days. The problem occurs intermittently. The problem has been waxing and waning. The pain is present in the substernal region. The pain is mild. The quality of the pain is described as sharp (aching). The pain does not radiate. Associated symptoms include a cough and malaise/fatigue. Pertinent negatives include no abdominal pain, back pain, claudication, diaphoresis, dizziness, exertional chest pressure, fever, headaches, hemoptysis, irregular heartbeat, leg pain, lower extremity edema, nausea, near-syncope, numbness, orthopnea, palpitations, PND, shortness of breath, sputum production, syncope, vomiting or weakness. Nothing relieves the cough. The pain is aggravated by coughing, breathing, deep breathing and  movement. He has tried nothing for the symptoms.  His past medical history is significant for diabetes, hyperlipidemia and hypertension.  Pertinent negatives for past medical history include no seizures. Prior diagnostic workup includes chest x-ray and echocardiogram (CTPA).     Relevant past medical, surgical, family, and social history reviewed and updated as indicated.  Allergies and medications reviewed and updated. Data reviewed: Chart in Epic.   Past Medical History:  Diagnosis Date   Adenomatous colon polyp    Arthritis    Cataract    Chest pain at rest 05/26/2016   Diabetes mellitus without complication ([place])    Dyslipidemia    ESOPHAGITIS, REFLUX 07/09/2004   Qualifier: Diagnosis of  By: Hardin Negus CMA Deborra Medina), Stephanie     Hiatal hernia    HOH (hard of hearing)    has bilateral Hearing aids   Hypertension    Irritable bowel syndrome    Kidney stone 1970   Pneumonia    Tick bites    took 5 rounds of Doxycycline this summer    Past Surgical History:  Procedure Laterality Date   COLONOSCOPY     ESOPHAGEAL DILATION  1996   ESOPHAGEAL DILATION  2003   FLEXIBLE SIGMOIDOSCOPY     HEMICOLECTOMY  08/2005   Right   KNEE SURGERY Right    TONSILLECTOMY      Social History   Socioeconomic History   Marital status: Married    Spouse name: [name]   Number of children: 1   Years of education: 16   Highest education level: Bachelor's degree (e.g., BA, AB, BS)  Occupational History   Occupation: Retired Pharmacist, hospital  Tobacco Use   Smoking status: Former  Packs/day: 2.00    Types: Cigarettes    Start date: 06/03/1954    Quit date: 08/24/1984    Years since quitting: 37.4   Smokeless tobacco: Never   Tobacco comments:    Has not used to tobacco products for the past 20 years  Vaping Use   Vaping Use: Never used  Substance and Sexual Activity   Alcohol use: Yes    Alcohol/week: 7.0 standard drinks of alcohol    Types: 7 Standard drinks or equivalent per week     Comment: [place] at night when he sits on the deck   Drug use: No   Sexual activity: Yes    Partners: Female  Other Topics Concern   Not on file  Social History Narrative   Lives in [place] with wife, both retired teachers   1 son   3 caffeinated beverages daily   1 alcoholic beverage daily   Former smoker no current tobacco no drug use   Social Determinants of Radio broadcast assistant Strain: Low Risk  (04/04/2021)   Overall Financial Resource Strain (CARDIA)    Difficulty of Paying Living Expenses: Not hard at all  Food Insecurity: No Food Insecurity (04/04/2021)   Hunger Vital Sign    Worried About Running Out of Food in the Last Year: Never true    [place] in the Last Year: Never true  Transportation Needs: No Transportation Needs (04/04/2021)   PRAPARE - Hydrologist (Medical): No    Lack of Transportation (Non-Medical): No  Physical Activity: Insufficiently Active (04/04/2021)   Exercise Vital Sign    Days of Exercise per Week: 3 days    Minutes of Exercise per Session: 20 min  Stress: No Stress Concern Present (04/04/2021)   [place]    Feeling of Stress : Not at all  Social Connections: Moderately Isolated (04/04/2021)   Social Connection and Isolation Panel [NHANES]    Frequency of Communication with Friends and Family: More than three times a week    Frequency of Social Gatherings with Friends and Family: Once a week    Attends Religious Services: Never    Marine scientist or Organizations: No    Attends Archivist Meetings: Never    Marital Status: Married  Human resources officer Violence: Not At Risk (04/04/2021)   Humiliation, Afraid, Rape, and Kick questionnaire    Fear of Current or Ex-Partner: No    Emotionally Abused: No    Physically Abused: No    Sexually Abused: No    Outpatient Encounter Medications as of 01/29/2022  Medication Sig    Accu-Chek Softclix Lancets lancets CHECK BLOOD SUGAR 4 TIMES A DAY OR AS DIRECTED   aspirin 81 MG tablet Take 81 mg by mouth daily.   Blood Glucose Monitoring Suppl w/Device KIT Test BS BID and PRN dx E11.9. Give test strips and lancets to match machine given   Chlorphen-PE-Acetaminophen 4-10-325 MG TABS Take 1 tablet by mouth every 6 (six) hours as needed.   cholecalciferol (VITAMIN D) 1000 UNITS tablet Take 2,000 Units by mouth daily.   Cinnamon 500 MG TABS Take 1,000 mg by mouth daily.   Continuous Blood Gluc Receiver (FREESTYLE LIBRE 2 READER) DEVI Use to test blood sugars up to 6 times daily as directed. DX: E11.9   Continuous Blood Gluc Sensor (FREESTYLE LIBRE 2 SENSOR) MISC USE AS DIRECTED UP TO SIX TIMES DAILY. CHANGE EVERY  14 DAYS   diclofenac Sodium (VOLTAREN) 1 % GEL Apply 2 g topically 4 (four) times daily.   diphenhydrAMINE (BENADRYL) 25 MG tablet Take 25 mg by mouth every 6 (six) hours as needed.   doxylamine, Sleep, (UNISOM) 25 MG tablet Take 25 mg by mouth at bedtime as needed for sleep.    empagliflozin (JARDIANCE) 10 MG TABS tablet Take by mouth daily.   esomeprazole (NEXIUM) 40 MG capsule TAKE (1) CAPSULE DAILY   famotidine (PEPCID) 20 MG tablet Take 1 tablet (20 mg total) by mouth 2 (two) times daily for 14 days.   glucose blood (ONETOUCH ULTRA) test strip Check blood sugar 4 times daily Dx E 11.40   insulin lispro (HUMALOG) 100 UNIT/ML injection Inject 10 Units into the skin once.   Insulin Pen Needle (PEN NEEDLES) 32G X 5 MM MISC 1 Device by Does not apply route daily.   JANUVIA 100 MG tablet TAKE 1 TABLET DAILY   MELATONIN PO Take by mouth.   metFORMIN (GLUCOPHAGE-XR) 500 MG 24 hr tablet TAKE 2 TABLETS 2 TIMES A DAY WITH MEALS   Multiple Vitamin (MULTI VITAMIN) TABS 1 tablet Orally Once a day for 30 day(s)   naproxen (NAPROSYN) 500 MG tablet Take 1 tablet (500 mg total) by mouth 2 (two) times daily with a meal for 14 days.   nystatin cream (MYCOSTATIN) Apply 1 Application  topically 2 (two) times daily.   olmesartan (BENICAR) 20 MG tablet TAKE 1 TABLET ONCE DAILY   Probiotic Product (PROBIOTIC PO) Take 1 tablet by mouth daily.   simvastatin (ZOCOR) 40 MG tablet TAKE 1 TABLET ONCE DAILY IN THE EVENING   TRESIBA FLEXTOUCH 100 UNIT/ML FlexTouch Pen Inject 0.06-0.2 mLs (6-20 Units total) into the skin daily at 10 pm.   [DISCONTINUED] cholecalciferol (VITAMIN D3) 25 MCG (1000 UNIT) tablet 1 tablet Orally Once a day for 30 day(s)   [DISCONTINUED] JARDIANCE 25 MG TABS tablet TAKE ONE TABLET BY MOUTH EVERY MORNING   [DISCONTINUED] aspirin 81 MG chewable tablet 1 tablet Orally Once a day for 30 day(s)   No facility-administered encounter medications on file as of 01/29/2022.    Allergies  Allergen Reactions   Ace Inhibitors Cough   Trazamine [Trazodone & Diet Manage Prod] Other (See Comments)    nightmares    Review of Systems  Constitutional:  Positive for activity change, fatigue and malaise/fatigue. Negative for appetite change, chills, diaphoresis, fever and unexpected weight change.  Eyes:  Negative for photophobia and visual disturbance.  Respiratory:  Positive for cough. Negative for apnea, hemoptysis, sputum production, choking, chest tightness, shortness of breath, wheezing and stridor.   Cardiovascular:  Positive for chest pain. Negative for palpitations, orthopnea, claudication, leg swelling, syncope, PND and near-syncope.  Gastrointestinal:  Negative for abdominal pain, nausea and vomiting.  Endocrine: Negative for cold intolerance, heat intolerance, polydipsia, polyphagia and polyuria.  Genitourinary:  Negative for decreased urine volume and difficulty urinating.  Musculoskeletal:  Negative for back pain.  Neurological:  Negative for dizziness, tremors, seizures, syncope, facial asymmetry, speech difficulty, weakness, light-headedness, numbness and headaches.  Psychiatric/Behavioral:  Negative for confusion.   All other systems reviewed and are  negative.       Objective:  BP (!) 109/59   Pulse 66   Temp (!) 97 F (36.1 C)   Ht 5' 9"  (1.753 m)   Wt 163 lb (73.9 kg)   SpO2 97%   BMI 24.07 kg/m    Wt Readings from Last 3 Encounters:  01/29/22  163 lb (73.9 kg)  01/24/22 167 lb (75.8 kg)  01/22/22 163 lb (73.9 kg)    Physical Exam Vitals and nursing note reviewed.  Constitutional:      General: He is not in acute distress.    Appearance: Normal appearance. He is normal weight. He is not ill-appearing, toxic-appearing or diaphoretic.     Comments: Appears worried  HENT:     Head: Normocephalic and atraumatic.     Right Ear: Decreased hearing noted.     Left Ear: Decreased hearing noted.     Ears:     Comments: Bilateral hearing aids    Mouth/Throat:     Mouth: Mucous membranes are moist.  Eyes:     Conjunctiva/sclera: Conjunctivae normal.     Pupils: Pupils are equal, round, and reactive to light.  Cardiovascular:     Rate and Rhythm: Normal rate and regular rhythm.     Heart sounds: Normal heart sounds. No murmur heard.    No friction rub. No gallop.  Pulmonary:     Effort: Pulmonary effort is normal. No respiratory distress.     Breath sounds: Normal breath sounds. No stridor. No wheezing, rhonchi or rales.  Chest:     Chest wall: Tenderness present.  Skin:    General: Skin is warm and dry.     Capillary Refill: Capillary refill takes less than 2 seconds.     Coloration: Skin is not pale.  Neurological:     General: No focal deficit present.     Mental Status: He is alert and oriented to person, place, and time.  Psychiatric:        Mood and Affect: Mood normal.        Behavior: Behavior normal.        Thought Content: Thought content normal.        Judgment: Judgment normal.     Results for orders placed or performed during the hospital encounter of 16/10/96  Basic metabolic panel  Result Value Ref Range   Sodium 133 (L) 135 - 145 mmol/L   Potassium 4.9 3.5 - 5.1 mmol/L   Chloride 104 98 -  111 mmol/L   CO2 22 22 - 32 mmol/L   Glucose, Bld 179 (H) 70 - 99 mg/dL   BUN 27 (H) 8 - 23 mg/dL   Creatinine, Ser 1.17 0.61 - 1.24 mg/dL   Calcium 8.5 (L) 8.9 - 10.3 mg/dL   GFR, Estimated >60 >60 mL/min   Anion gap 7 5 - 15  CBC  Result Value Ref Range   WBC 5.1 4.0 - 10.5 K/uL   RBC 3.57 (L) 4.22 - 5.81 MIL/uL   Hemoglobin 11.3 (L) 13.0 - 17.0 g/dL   HCT 33.1 (L) 39.0 - 52.0 %   MCV 92.7 80.0 - 100.0 fL   MCH 31.7 26.0 - 34.0 pg   MCHC 34.1 30.0 - 36.0 g/dL   RDW 12.3 11.5 - 15.5 %   Platelets 111 (L) 150 - 400 K/uL   nRBC 0.0 0.0 - 0.2 %  Troponin I (High Sensitivity)  Result Value Ref Range   Troponin I (High Sensitivity) 5 <18 ng/L  Troponin I (High Sensitivity)  Result Value Ref Range   Troponin I (High Sensitivity) 5 <18 ng/L     EKG in office: SR 62, PR 194 ms, QT 424 ms, RBBB, no acute ST-T changes of ectopy . No changes from prior EKG. Monia Pouch, FNP-C  X-Ray: CXR: No acute findings. Preliminary x-ray reading  by Monia Pouch, FNP-C, [facility].  Pertinent labs & imaging results that were available during my care of the patient were reviewed by me and considered in my medical decision making.  Assessment & Plan:  Caulin was seen today for post covid chest pain.  Diagnoses and all orders for this visit:  Chest pain in adult Post-COVID-19 condition EKG and CXR without acute findings. STAT labs ordered. Reassurance provided as there is no indication of ACS or PE. Pt and wife aware of red flags which require emergent evaluation and treatment. Will notify of labs once resulted.  -     CBC with Differential/Platelet -     CMP14+EGFR -     D-dimer, quantitative -     DG Chest 2 View; Future -     EKG 12-Lead  Pleurisy EKG and CXR without acute findings. STAT labs ordered. Reassurance provided as there is no indication of ACS or PE. Symptoms and physical exam consistent with pleurisy.  Pt and wife aware of red flags which require emergent evaluation and treatment.  Will notify of labs once resulted. Medications as prescribed. Report new, worsening, or persistent symptoms.  -     naproxen (NAPROSYN) 500 MG tablet; Take 1 tablet (500 mg total) by mouth 2 (two) times daily with a meal for 14 days. -     famotidine (PEPCID) 20 MG tablet; Take 1 tablet (20 mg total) by mouth 2 (two) times daily for 14 days.     Continue all other maintenance medications.  Follow up plan: Return if symptoms worsen or fail to improve.   Continue healthy lifestyle choices, including diet (rich in fruits, vegetables, and lean proteins, and low in salt and simple carbohydrates) and exercise (at least 30 minutes of moderate physical activity daily).  Educational handout given for pleurisy  Total time spent with patient 45 minutes.  Greater than 50% of encounter spent in coordination of care/counseling.   The above assessment and management plan was discussed with the patient. The patient verbalized understanding of and has agreed to the management plan. Patient is aware to call the clinic if they develop any new symptoms or if symptoms persist or worsen. Patient is aware when to return to the clinic for a follow-up visit. Patient educated on when it is appropriate to go to the emergency department.   Monia Pouch, FNP-C [facility] [phone]

## 2022-01-30 LAB — CMP14+EGFR
ALT: 12 IU/L (ref 0–44)
AST: 10 IU/L (ref 0–40)
Albumin/Globulin Ratio: 1.7 (ref 1.2–2.2)
Albumin: 4 g/dL (ref 3.8–4.8)
Alkaline Phosphatase: 69 IU/L (ref 44–121)
BUN/Creatinine Ratio: 15 (ref 10–24)
BUN: 19 mg/dL (ref 8–27)
Bilirubin Total: 0.3 mg/dL (ref 0.0–1.2)
CO2: 23 mmol/L (ref 20–29)
Calcium: 8.5 mg/dL — ABNORMAL LOW (ref 8.6–10.2)
Chloride: 100 mmol/L (ref 96–106)
Creatinine, Ser: 1.25 mg/dL (ref 0.76–1.27)
Globulin, Total: 2.3 g/dL (ref 1.5–4.5)
Glucose: 355 mg/dL — ABNORMAL HIGH (ref 70–99)
Potassium: 5.3 mmol/L — ABNORMAL HIGH (ref 3.5–5.2)
Sodium: 134 mmol/L (ref 134–144)
Total Protein: 6.3 g/dL (ref 6.0–8.5)
eGFR: 58 mL/min/{1.73_m2} — ABNORMAL LOW (ref >59)

## 2022-01-30 LAB — CBC WITH DIFFERENTIAL/PLATELET
Basophils Absolute: 0 10*3/uL (ref 0.0–0.2)
Basos: 1 %
EOS (ABSOLUTE): 0.2 10*3/uL (ref 0.0–0.4)
Eos: 3 %
Hematocrit: 33.2 % — ABNORMAL LOW (ref 37.5–51.0)
Hemoglobin: 11 g/dL — ABNORMAL LOW (ref 13.0–17.7)
Immature Grans (Abs): 0 10*3/uL (ref 0.0–0.1)
Immature Granulocytes: 1 %
Lymphocytes Absolute: 0.8 10*3/uL (ref 0.7–3.1)
Lymphs: 12 %
MCH: 31.3 pg (ref 26.6–33.0)
MCHC: 33.1 g/dL (ref 31.5–35.7)
MCV: 94 fL (ref 79–97)
Monocytes Absolute: 0.7 10*3/uL (ref 0.1–0.9)
Monocytes: 11 %
Neutrophils Absolute: 4.8 10*3/uL (ref 1.4–7.0)
Neutrophils: 72 %
Platelets: 175 10*3/uL (ref 150–450)
RBC: 3.52 x10E6/uL — ABNORMAL LOW (ref 4.14–5.80)
RDW: 12.2 % (ref 11.6–15.4)
WBC: 6.5 10*3/uL (ref 3.4–10.8)

## 2022-02-01 ENCOUNTER — Other Ambulatory Visit: Payer: Self-pay | Admitting: Family Medicine

## 2022-02-01 DIAGNOSIS — H903 Sensorineural hearing loss, bilateral: Secondary | ICD-10-CM | POA: Diagnosis not present

## 2022-02-01 DIAGNOSIS — Z974 Presence of external hearing-aid: Secondary | ICD-10-CM | POA: Diagnosis not present

## 2022-02-01 DIAGNOSIS — I700 Atherosclerosis of aorta: Secondary | ICD-10-CM

## 2022-02-01 DIAGNOSIS — E1140 Type 2 diabetes mellitus with diabetic neuropathy, unspecified: Secondary | ICD-10-CM

## 2022-02-01 DIAGNOSIS — E1169 Type 2 diabetes mellitus with other specified complication: Secondary | ICD-10-CM

## 2022-02-08 ENCOUNTER — Ambulatory Visit: Payer: Medicare PPO | Admitting: Pharmacist

## 2022-02-08 DIAGNOSIS — E1140 Type 2 diabetes mellitus with diabetic neuropathy, unspecified: Secondary | ICD-10-CM

## 2022-02-08 DIAGNOSIS — E1122 Type 2 diabetes mellitus with diabetic chronic kidney disease: Secondary | ICD-10-CM

## 2022-02-11 DIAGNOSIS — S139XXA Sprain of joints and ligaments of unspecified parts of neck, initial encounter: Secondary | ICD-10-CM | POA: Insufficient documentation

## 2022-02-12 ENCOUNTER — Ambulatory Visit: Payer: Medicare PPO | Admitting: Family Medicine

## 2022-02-12 ENCOUNTER — Ambulatory Visit: Payer: Medicare PPO

## 2022-02-19 ENCOUNTER — Other Ambulatory Visit: Payer: Self-pay | Admitting: Family Medicine

## 2022-02-19 DIAGNOSIS — E1140 Type 2 diabetes mellitus with diabetic neuropathy, unspecified: Secondary | ICD-10-CM

## 2022-02-26 NOTE — Progress Notes (Signed)
Chronic Care Management Pharmacy Note  02/08/2022 Name:  Joe [name] MRN:  [id number] DOB:  [birth year]  Summary:  Diabetes: Goal on Track (progressing): YES. Uncontrolled;  Current treatment: TRESIBA 16-20 units, JANUVIA 150m, METFORMIN, JARDIANCE 248m Fiasp (rapid acting insulin) 5-10 units with breakfast & lunch;  Added low dose rapid acting insulin (Fiasp) 5-10 units before breakfast & lunch (sliding scale given) A1c decreased to 8.3%-->7.7% Patient does not wish to try a GLP1 (he does not want additional weight loss) Current glucose readings: fasting glucose: usually <130, post prandial glucose: most within range, but can get in the 200s depending on diet; recent illness w/ COVID, therefore blood sugars have been elevated Current meal patterns: patient/wife track food diary; he is eating a balanced diet for the most part (indulges in treats every now & then) Current exercise: OCCASIONAL  Recommended adding breakfast rapid acting insulin    Patient Goals/Self-Care Activities patient will:  - take medications as prescribed as evidenced by patient report and record review check glucose USING LIBRE 2 CGM, document, and provide at future appointments target a minimum of 150 minutes of moderate intensity exercise weekly engage in dietary modifications by FOLLOWING A HEART HEALTHY DIET/HEALTHY PLATE METHOD   Subjective: Joe [name] an [age].o. year old male who is a primary patient of Joe [name].  The CCM team was consulted for assistance with disease management and care coordination needs.    Engaged with patient face to face for follow up visit in response to provider referral for pharmacy case management and/or care coordination services.   Consent to Services:  The patient was given information about Chronic Care Management services, agreed to services, and gave verbal consent prior to initiation of services.  Please see initial visit note for detailed  documentation.   Patient Care Team: Joe [name] as PCP - General (Family Medicine) Joe [name] (Urology) Joe [name] (Gastroenterology) Joe [name] as Consulting Physician (Cardiology) [facility]) Joe [name] as Referring Physician (Optometry) Joe [name] (Family Medicine) Objective:  Lab Results  Component Value Date   CREATININE 1.25 01/29/2022   CREATININE 1.17 01/24/2022   CREATININE 1.02 01/08/2022    Lab Results  Component Value Date   HGBA1C 7.7 (H) 01/08/2022   Last diabetic Eye exam:  Lab Results  Component Value Date/Time   HMDIABEYEEXA No Retinopathy 08/03/2018 12:00 AM    Last diabetic Foot exam: No results found for: "HMDIABFOOTEX"      Component Value Date/Time   CHOL 122 07/12/2021 1011   CHOL 105 12/24/2012 0850   TRIG 173 (H) 07/12/2021 1011   TRIG 207 (H) 10/29/2016 1100   TRIG 46 12/24/2012 0850   HDL 43 07/12/2021 1011   HDL 34 (L) 10/29/2016 1100   HDL 44 12/24/2012 0850   CHOLHDL 2.8 07/12/2021 1011   CHOLHDL 2.8 05/26/2016 0349   VLDL 20 05/26/2016 0349   LDLCALC 50 07/12/2021 1011   LDLCALC 59 02/03/2014 0840   LDLCALC 52 12/24/2012 0850       Latest Ref Rng & Units 01/29/2022    1:07 PM 01/08/2022   10:54 AM 07/12/2021   10:11 AM  Hepatic Function  Total Protein 6.0 - 8.5 g/dL 6.3  6.0  6.1   Albumin 3.8 - 4.8 g/dL 4.0  4.1  4.3   AST 0 - 40 IU/L 10  15  17  ALT 0 - 44 IU/L _0 Alk Phosphatase 44 - 121 IU/L 69  55  59   Total Bilirubin 0.0 - 1.2 mg/dL 0.3  0.5  0.5     Lab Results  Component Value Date/Time   TSH 2.610 01/08/2022 10:54 AM   TSH 0.918 03/07/2017 12:12 PM       Latest Ref Rng & Units 01/29/2022    1:07 PM 01/24/2022   12:07 PM 01/08/2022   10:54 AM  CBC  WBC 3.4 - 10.8 x10E3/uL 6.5  5.1  5.3   Hemoglobin 13.0 - 17.7 g/dL 11.0  11.3  11.1   Hematocrit 37.5 - 51.0 % 33.2  33.1  32.7   Platelets 150 - 450 x10E3/uL 175  111   131     Lab Results  Component Value Date/Time   VD25OH 54.3 10/02/2018 10:19 AM   VD25OH 46.8 04/01/2018 11:11 AM    Clinical ASCVD: No  The ASCVD Risk score (Arnett DK, et al., 2019) failed to calculate for the following reasons:   The 2019 ASCVD risk score is only valid for ages 79 to 9    Other: (CHADS2VASc if Afib, PHQ9 if depression, MMRC or CAT for COPD, ACT, DEXA)  Social History   Tobacco Use  Smoking Status Former   Packs/day: 2.00   Types: Cigarettes   Start date: 06/03/1954   Quit date: 08/24/1984   Years since quitting: 37.5  Smokeless Tobacco Never  Tobacco Comments   Has not used to tobacco products for the past 20 years   BP Readings from Last 3 Encounters:  01/29/22 (!) 109/59  01/24/22 108/89  01/22/22 125/63   Pulse Readings from Last 3 Encounters:  01/29/22 66  01/24/22 61  01/22/22 67   Wt Readings from Last 3 Encounters:  01/29/22 163 lb (73.9 kg)  01/24/22 167 lb (75.8 kg)  01/22/22 163 lb (73.9 kg)    Assessment: Review of patient past medical history, allergies, medications, health status, including review of consultants reports, laboratory and other test data, was performed as part of comprehensive evaluation and provision of chronic care management services.   SDOH:  (Social Determinants of Health) assessments and interventions performed:  SDOH Interventions    Flowsheet Row Clinical Support from 04/04/2021 in [place] Visit from 11/01/2014 in [place] Interventions    Food Insecurity Interventions Intervention Not Indicated --  Housing Interventions Intervention Not Indicated --  Transportation Interventions Intervention Not Indicated --  Depression Interventions/Treatment  -- Counseling  Financial Strain Interventions Intervention Not Indicated --  Physical Activity Interventions Patient Refused --  Stress Interventions Intervention Not Indicated --  Social Connections  Interventions Intervention Not Indicated --       CCM Care Plan  Allergies  Allergen Reactions   Ace Inhibitors Cough   Trazamine [Trazodone & Diet Manage Prod] Other (See Comments)    nightmares    Medications Reviewed Today     Reviewed by Baruch Gouty, FNP (Family Nurse Practitioner) on 01/29/22 at 65  Med List Status: <None>   Medication Order Taking? Sig Documenting Provider Last Dose Status Informant  Accu-Chek Softclix Lancets lancets [id number] Yes CHECK BLOOD SUGAR 4 TIMES A DAY OR AS DIRECTED [provider] Taking Active   aspirin 81 MG tablet [id number] Yes Take 81 mg by mouth daily. [provider] Taking Active Spouse/Significant Other  Blood Glucose Monitoring Suppl w/Device KIT [id number] Yes Test BS  BID and PRN dx E11.9. Give test strips and lancets to match machine given Chipper Herb, MD Taking Active   Chlorphen-PE-Acetaminophen 4-10-325 MG TABS [id number] Yes Take 1 tablet by mouth every 6 (six) hours as needed. Baruch Gouty, FNP Taking Active   cholecalciferol (VITAMIN D) 1000 UNITS tablet [id number] Yes Take 2,000 Units by mouth daily. [provider] Taking Active Spouse/Significant Other  Cinnamon 500 MG TABS [id number] Yes Take 1,000 mg by mouth daily. [provider] Taking Active Spouse/Significant Other  Continuous Blood Gluc Receiver (FREESTYLE LIBRE 2 READER) DEVI [id number] Yes Use to test blood sugars up to 6 times daily as directed. DX: E11.9 Janora Norlander, DO Taking Active   Continuous Blood Gluc Sensor (FREESTYLE LIBRE 2 SENSOR) MISC [id number] Yes USE AS DIRECTED UP TO SIX TIMES DAILY. CHANGE EVERY 14 DAYS Rakes, Connye Burkitt, FNP Taking Active   diclofenac Sodium (VOLTAREN) 1 % GEL [id number] Yes Apply 2 g topically 4 (four) times daily. Ivy Lynn, NP Taking Active   diphenhydrAMINE (BENADRYL) 25 MG tablet [id number] Yes Take 25 mg by mouth every 6 (six) hours as needed. [provider] Taking Active    doxylamine, Sleep, (UNISOM) 25 MG tablet [id number] Yes Take 25 mg by mouth at bedtime as needed for sleep.  [provider] Taking Active Spouse/Significant Other  empagliflozin (JARDIANCE) 10 MG TABS tablet [id number] Yes Take by mouth daily. [provider] Taking Active   esomeprazole (NEXIUM) 40 MG capsule [id number] Yes TAKE (1) CAPSULE DAILY Rakes, Connye Burkitt, FNP Taking Active   famotidine (PEPCID) 20 MG tablet [id number] Yes Take 1 tablet (20 mg total) by mouth 2 (two) times daily for 14 days. Baruch Gouty, FNP  Active   glucose blood [facility] ULTRA) test strip [id number] Yes Check blood sugar 4 times daily Dx E 11.40 Baruch Gouty, FNP Taking Active   insulin lispro (HUMALOG) 100 UNIT/ML injection [id number] Yes Inject 10 Units into the skin once. [provider] Taking Active   Insulin Pen Needle (PEN NEEDLES) 32G X 5 MM MISC [id number] Yes 1 Device by Does not apply route daily. Janora Norlander, DO Taking Active   JANUVIA 100 MG tablet [id number] Yes TAKE 1 TABLET DAILY Baruch Gouty, FNP Taking Active   MELATONIN PO [id number] Yes Take by mouth. [provider] Taking Active   metFORMIN (GLUCOPHAGE-XR) 500 MG 24 hr tablet [id number] Yes TAKE 2 TABLETS 2 TIMES A DAY WITH MEALS Rakes, Connye Burkitt, FNP Taking Active   Multiple Vitamin (MULTI VITAMIN) TABS [id number] Yes 1 tablet Orally Once a day for 30 day(s) [provider] Taking Active   naproxen (NAPROSYN) 500 MG tablet [id number] Yes Take 1 tablet (500 mg total) by mouth 2 (two) times daily with a meal for 14 days. Baruch Gouty, FNP  Active   nystatin cream (MYCOSTATIN) [id number] Yes Apply 1 Application topically 2 (two) times daily. Baruch Gouty, FNP Taking Active   olmesartan (BENICAR) 20 MG tablet [id number] Yes TAKE 1 TABLET ONCE DAILY Rakes, Connye Burkitt, FNP Taking Active   Probiotic Product (PROBIOTIC PO) [id number] Yes Take 1 tablet by mouth daily. [provider] Taking Active  Spouse/Significant Other  simvastatin (ZOCOR) 40 MG tablet [id number] Yes TAKE 1 TABLET ONCE DAILY IN THE EVENING Rakes, Connye Burkitt, FNP Taking Active   TRESIBA FLEXTOUCH 100 UNIT/ML FlexTouch Pen [id number] Yes Inject 0.06-0.2 mLs (6-20 Units total) into the skin daily at 10 pm. Baruch Gouty, FNP Taking Active  Patient Active Problem List   Diagnosis Date Noted   Neck sprain 02/11/2022   Gait instability 10/09/2021   Exposure to Agent Orange 04/04/2021   Seborrheic dermatitis, unspecified 04/04/2021   Presbycusis of both ears 05/09/2020   Chronic mycotic otitis externa 09/29/2019   Chronic left shoulder pain 08/13/2019   CKD stage 3 due to type 2 diabetes mellitus ([place]) 01/27/2019   Erectile dysfunction due to arterial insufficiency 04/15/2018   Thrombocytopenia ([place]) 03/18/2015   Atopic dermatitis 01/23/2015   Thoracic disc herniation 06/20/2014   Left varicocele 06/20/2014   Abdominal aortic atherosclerosis ([place]) 06/20/2014   Ventral hernia without obstruction or gangrene 02/04/2014   History of colonic polyps 02/04/2014   Hyperlipidemia associated with type 2 diabetes mellitus ([place]) 12/30/2012   Hypertension associated with diabetes ([place]) 12/30/2012   Benign prostatic hyperplasia 12/30/2012   Type 2 diabetes mellitus with diabetic neuropathy, with long-term current use of insulin ([place]) 09/28/2012   RBBB 04/11/2010   Cardiovascular function study, abnormal 04/11/2010   Gastroesophageal reflux disease without esophagitis 09/14/2007   Nephrolithiasis 09/14/2007    Immunization History  Administered Date(s) Administered   Fluad Quad(high Dose 65+) 03/16/2019, 04/17/2020, 03/23/2021   Influenza, High Dose Seasonal PF 03/28/2016, 03/07/2017, 04/01/2018   Influenza,inj,Quad PF,6+ Mos 03/23/2013, 03/26/2014, 03/17/2015   Influenza-Unspecified 03/28/2016, 03/07/2017, 04/01/2018, 03/03/2021   Moderna Covid-19 Vaccine Bivalent Booster 55yr & up 03/03/2021, 03/14/2021    Moderna Sars-Covid-2 Vaccination 06/21/2019, 07/19/2019, 03/28/2020, 10/04/2020   Pneumococcal Conjugate-13 06/03/2013, 06/08/2013   Pneumococcal Polysaccharide-23 04/03/2008   Tdap 03/13/2011, 12/26/2018    Conditions to be addressed/monitored: HLD and DMII  Care Plan : PHARMD MEDICATION MANAGEMENT  Updates made by PLavera Guise RFrancesvillesince 02/26/2022 12:00 AM     Problem: DISEASE PROGRESSION PREVENTION      Long-Range Goal: T2DM PHARMD GOAL   Recent Progress: Not on track  Note:   Current Barriers:  Unable to maintain control of T2DM  [name] Clinical Goal(s):  patient will maintain control of T2DM as evidenced by GOAL A1C<7% AND >70% TIME IN TARGET RANGE  through collaboration with PharmD and provider.    Interventions: 1:1 collaboration with Rakes, LConnye Burkitt FNP regarding development and update of comprehensive plan of care as evidenced by provider attestation and co-signature Inter-disciplinary care team collaboration (see longitudinal plan of care) Comprehensive medication review performed; medication list updated in electronic medical record  Diabetes: Goal on Track (progressing): YES. Uncontrolled;  Current treatment: TRESIBA 16-20 units, JANUVIA 1057m METFORMIN, JARDIANCE 2531m Added low dose rapid acting insulin (Fiasp) 5-10 units before breakfast & lunch (sliding scale given) A1c decreased to 8.8-->8.3% Patient does not wish to try a GLP1 (he does not want additional weight loss) Current glucose readings: fasting glucose: usually <130, post prandial glucose: most within range, but can get in the 200s depending on diet; recent illness w/ COVID, therefore blood sugars have been elevated Current meal patterns: patient/wife track food diary; he is eating a balanced diet for the most part (indulges in treats every now & then) Current exercise: OCCASIONAL  Recommended adding breakfast rapid acting insulin    Patient Goals/Self-Care Activities patient will:  -  take medications as prescribed as evidenced by patient report and record review check glucose USING LIBRE 2 CGM, document, and provide at future appointments target a minimum of 150 minutes of moderate intensity exercise weekly engage in dietary modifications by   FOLLOWING A HEART HEALTHY DIET/HEALTHY PLATE METHOD       Follow Up:  Patient agrees  to Care Plan and Follow-up.  Plan:  patient to call and schedule after next PCP f/u    Regina Eck, PharmD, BCPS Clinical [name], [place]  [facility] Phone [phone]

## 2022-03-05 DIAGNOSIS — E1142 Type 2 diabetes mellitus with diabetic polyneuropathy: Secondary | ICD-10-CM | POA: Diagnosis not present

## 2022-03-05 DIAGNOSIS — M79676 Pain in unspecified toe(s): Secondary | ICD-10-CM | POA: Diagnosis not present

## 2022-03-05 DIAGNOSIS — B351 Tinea unguium: Secondary | ICD-10-CM | POA: Diagnosis not present

## 2022-03-05 DIAGNOSIS — L84 Corns and callosities: Secondary | ICD-10-CM | POA: Diagnosis not present

## 2022-03-07 ENCOUNTER — Other Ambulatory Visit: Payer: Self-pay | Admitting: Family Medicine

## 2022-03-07 DIAGNOSIS — E1140 Type 2 diabetes mellitus with diabetic neuropathy, unspecified: Secondary | ICD-10-CM

## 2022-03-12 ENCOUNTER — Telehealth: Payer: Self-pay | Admitting: Family Medicine

## 2022-03-12 MED ORDER — ACCU-CHEK GUIDE VI STRP: ORAL_STRIP | 3 refills | Status: AC

## 2022-03-12 NOTE — Telephone Encounter (Signed)
Test strips sent to pharmacy.

## 2022-04-04 ENCOUNTER — Other Ambulatory Visit: Payer: Self-pay | Admitting: Family Medicine

## 2022-04-04 DIAGNOSIS — E1140 Type 2 diabetes mellitus with diabetic neuropathy, unspecified: Secondary | ICD-10-CM

## 2022-04-10 ENCOUNTER — Ambulatory Visit: Payer: Medicare PPO | Admitting: Family Medicine

## 2022-04-10 ENCOUNTER — Encounter: Payer: Self-pay | Admitting: Family Medicine

## 2022-04-10 VITALS — BP 104/53 | HR 68 | Temp 97.6°F | Resp 20 | Ht 69.0 in | Wt 169.0 lb

## 2022-04-10 DIAGNOSIS — E1122 Type 2 diabetes mellitus with diabetic chronic kidney disease: Secondary | ICD-10-CM | POA: Diagnosis not present

## 2022-04-10 DIAGNOSIS — E1169 Type 2 diabetes mellitus with other specified complication: Secondary | ICD-10-CM | POA: Diagnosis not present

## 2022-04-10 DIAGNOSIS — E785 Hyperlipidemia, unspecified: Secondary | ICD-10-CM | POA: Diagnosis not present

## 2022-04-10 DIAGNOSIS — Z794 Long term (current) use of insulin: Secondary | ICD-10-CM

## 2022-04-10 DIAGNOSIS — E1159 Type 2 diabetes mellitus with other circulatory complications: Secondary | ICD-10-CM

## 2022-04-10 DIAGNOSIS — I152 Hypertension secondary to endocrine disorders: Secondary | ICD-10-CM

## 2022-04-10 DIAGNOSIS — N183 Chronic kidney disease, stage 3 unspecified: Secondary | ICD-10-CM | POA: Diagnosis not present

## 2022-04-10 DIAGNOSIS — Z23 Encounter for immunization: Secondary | ICD-10-CM

## 2022-04-10 DIAGNOSIS — E1140 Type 2 diabetes mellitus with diabetic neuropathy, unspecified: Secondary | ICD-10-CM

## 2022-04-10 LAB — BAYER DCA HB A1C WAIVED: HB A1C (BAYER DCA - WAIVED): 8.3 % — ABNORMAL HIGH (ref 4.8–5.6)

## 2022-04-10 NOTE — Patient Instructions (Addendum)

## 2022-04-10 NOTE — Progress Notes (Signed)
Subjective:  [name], male    DOB: 1941-01-20, 81 y.o.   MRN: [id number]  Patient Care Team: Baruch Gouty, FNP as PCP - General (Family Medicine) Irine Seal, MD (Urology) Gatha Mayer, MD (Gastroenterology) Minus Breeding, MD as Consulting Physician (Cardiology) Lavera Guise, [facility] (Pharmacist) Harlen Labs, MD as Referring Physician (Optometry) Blanca Friend Royce Macadamia, [facility] as Pharmacist (Family Medicine)   Chief Complaint:  Medical Management of Chronic Issues   HPI: Joe [name] is a 81 y.o. male presenting on 04/10/2022 for Medical Management of Chronic Issues   1. Type 2 diabetes mellitus with diabetic neuropathy, with long-term current use of insulin (HCC) Pt is compliant with current regimen. Reports some low readings at night and this concerns him pertaining to nighttime insulin dosing. He does not eat a bedtime snack. Denies any specific hypoglycemic symptoms. No polyuria, polyphagia, or polydipsia.   2. CKD stage 3 due to type 2 diabetes mellitus ([place]) On ARB therapy and tolerating well. No decreased urine output, leg swelling, weakness, or confusion.   3. Hypertension associated with diabetes ([place]) Compliant with medications without associated side effects. Denies chest pain, headaches, leg swelling, weakness, confusion, dizziness, or syncope.   4. Hyperlipidemia associated with type 2 diabetes mellitus ([place]) On statin therapy and tolerating well. No reported myalgias. Does try to follow a healthy diet and is active daily.      Relevant past medical, surgical, family, and social history reviewed and updated as indicated.  Allergies and medications reviewed and updated. Data reviewed: Chart in Epic.   Past Medical History:  Diagnosis Date   Adenomatous colon polyp    Arthritis    Cataract    Chest pain at rest 05/26/2016   Diabetes mellitus without complication ([place])    Dyslipidemia    ESOPHAGITIS, REFLUX 07/09/2004   Qualifier:  Diagnosis of  By: Hardin Negus CMA Deborra Medina), Stephanie     Hiatal hernia    HOH (hard of hearing)    has bilateral Hearing aids   Hypertension    Irritable bowel syndrome    Kidney stone 1970   Pneumonia    Tick bites    took 5 rounds of Doxycycline this summer    Past Surgical History:  Procedure Laterality Date   COLONOSCOPY     ESOPHAGEAL DILATION  1996   ESOPHAGEAL DILATION  2003   FLEXIBLE SIGMOIDOSCOPY     HEMICOLECTOMY  08/2005   Right   KNEE SURGERY Right    TONSILLECTOMY      Social History   Socioeconomic History   Marital status: Married    Spouse name: [name]   Number of children: 1   Years of education: 16   Highest education level: Bachelor's degree (e.g., BA, AB, BS)  Occupational History   Occupation: Retired Pharmacist, hospital  Tobacco Use   Smoking status: Former    Packs/day: 2.00    Types: Cigarettes    Start date: 06/03/1954    Quit date: 08/24/1984    Years since quitting: 37.6   Smokeless tobacco: Never   Tobacco comments:    Has not used to tobacco products for the past 20 years  Vaping Use   Vaping Use: Never used  Substance and Sexual Activity   Alcohol use: Yes    Alcohol/week: 7.0 standard drinks of alcohol    Types: 7 Standard drinks or equivalent per week    Comment: [place] at night when he sits on the deck  Drug use: No   Sexual activity: Yes    Partners: Female  Other Topics Concern   Not on file  Social History Narrative   Lives in [place] with wife, both retired teachers   1 son   3 caffeinated beverages daily   1 alcoholic beverage daily   Former smoker no current tobacco no drug use   Social Determinants of Radio broadcast assistant Strain: Low Risk  (04/04/2021)   Overall Financial Resource Strain (CARDIA)    Difficulty of Paying Living Expenses: Not hard at all  Food Insecurity: No Food Insecurity (04/04/2021)   Hunger Vital Sign    Worried About Running Out of Food in the Last Year: Never true    [place] in the  Last Year: Never true  Transportation Needs: No Transportation Needs (04/04/2021)   PRAPARE - Hydrologist (Medical): No    Lack of Transportation (Non-Medical): No  Physical Activity: Insufficiently Active (04/04/2021)   Exercise Vital Sign    Days of Exercise per Week: 3 days    Minutes of Exercise per Session: 20 min  Stress: No Stress Concern Present (04/04/2021)   [place]    Feeling of Stress : Not at all  Social Connections: Moderately Isolated (04/04/2021)   Social Connection and Isolation Panel [NHANES]    Frequency of Communication with Friends and Family: More than three times a week    Frequency of Social Gatherings with Friends and Family: Once a week    Attends Religious Services: Never    Marine scientist or Organizations: No    Attends Archivist Meetings: Never    Marital Status: Married  Human resources officer Violence: Not At Risk (04/04/2021)   Humiliation, Afraid, Rape, and Kick questionnaire    Fear of Current or Ex-Partner: No    Emotionally Abused: No    Physically Abused: No    Sexually Abused: No    Outpatient Encounter Medications as of 04/10/2022  Medication Sig   Accu-Chek Softclix Lancets lancets CHECK BLOOD SUGAR 4 TIMES A DAY OR AS DIRECTED   aspirin 81 MG tablet Take 81 mg by mouth daily.   Blood Glucose Monitoring Suppl w/Device KIT Test BS BID and PRN dx E11.9. Give test strips and lancets to match machine given   Chlorphen-PE-Acetaminophen 4-10-325 MG TABS Take 1 tablet by mouth every 6 (six) hours as needed.   cholecalciferol (VITAMIN D) 1000 UNITS tablet Take 2,000 Units by mouth daily.   Cinnamon 500 MG TABS Take 1,000 mg by mouth daily.   Continuous Blood Gluc Receiver (FREESTYLE LIBRE 2 READER) DEVI Use to test blood sugars up to 6 times daily as directed. DX: E11.9   Continuous Blood Gluc Sensor (FREESTYLE LIBRE 2 SENSOR) MISC USE AS  DIRECTED UP TO SIX TIMES DAILY. CHANGE EVERY 14 DAYS   diclofenac Sodium (VOLTAREN) 1 % GEL Apply 2 g topically 4 (four) times daily.   diphenhydrAMINE (BENADRYL) 25 MG tablet Take 25 mg by mouth every 6 (six) hours as needed.   doxylamine, Sleep, (UNISOM) 25 MG tablet Take 25 mg by mouth at bedtime as needed for sleep.    empagliflozin (JARDIANCE) 25 MG TABS tablet TAKE ONE TABLET BY MOUTH EVERY MORNING   esomeprazole (NEXIUM) 40 MG capsule TAKE (1) CAPSULE DAILY   glucose blood (ACCU-CHEK GUIDE) test strip Check blood sugar 4 times daily Dx E 11.40   insulin lispro (  HUMALOG) 100 UNIT/ML injection Inject 10 Units into the skin once.   Insulin Pen Needle (PEN NEEDLES) 32G X 5 MM MISC 1 Device by Does not apply route daily.   JANUVIA 100 MG tablet TAKE 1 TABLET DAILY   MELATONIN PO Take by mouth.   metFORMIN (GLUCOPHAGE-XR) 500 MG 24 hr tablet TAKE 2 TABLETS 2 TIMES A DAY WITH MEALS   Multiple Vitamin (MULTI VITAMIN) TABS 1 tablet Orally Once a day for 30 day(s)   nystatin cream (MYCOSTATIN) Apply 1 Application topically 2 (two) times daily.   olmesartan (BENICAR) 20 MG tablet TAKE 1 TABLET ONCE DAILY   Probiotic Product (PROBIOTIC PO) Take 1 tablet by mouth daily.   simvastatin (ZOCOR) 40 MG tablet TAKE 1 TABLET ONCE DAILY IN THE EVENING   TRESIBA FLEXTOUCH 100 UNIT/ML FlexTouch Pen Inject 0.06-0.2 mLs (6-20 Units total) into the skin daily at 10 pm.   [DISCONTINUED] empagliflozin (JARDIANCE) 10 MG TABS tablet Take by mouth daily.   [DISCONTINUED] fluticasone (FLONASE) 50 MCG/ACT nasal spray Instill 2 puffs each nostril every night.   [DISCONTINUED] famotidine (PEPCID) 20 MG tablet Take 1 tablet (20 mg total) by mouth 2 (two) times daily for 14 days.   No facility-administered encounter medications on file as of 04/10/2022.    Allergies  Allergen Reactions   Ace Inhibitors Cough   Trazamine [Trazodone & Diet Manage Prod] Other (See Comments)    nightmares    Review of Systems   Constitutional:  Negative for activity change, appetite change, chills, diaphoresis, fatigue, fever and unexpected weight change.  HENT:  Positive for hearing loss (chronic).   Eyes: Negative.  Negative for photophobia and visual disturbance.  Respiratory:  Negative for cough, chest tightness and shortness of breath.   Cardiovascular:  Negative for chest pain, palpitations and leg swelling.  Gastrointestinal:  Negative for abdominal pain, blood in stool, constipation, diarrhea, nausea and vomiting.  Endocrine: Negative.   Genitourinary:  Negative for decreased urine volume, difficulty urinating, dysuria, frequency and urgency.  Musculoskeletal:  Negative for arthralgias and myalgias.  Skin: Negative.   Allergic/Immunologic: Negative.   Neurological:  Negative for dizziness, tremors, seizures, syncope, facial asymmetry, speech difficulty, weakness, light-headedness, numbness (toes, chronic) and headaches.  Hematological: Negative.   Psychiatric/Behavioral:  Negative for confusion, hallucinations, sleep disturbance and suicidal ideas.   All other systems reviewed and are negative.       Objective:  BP (!) 104/53   Pulse 68   Temp 97.6 F (36.4 C) (Temporal)   Resp 20   Ht _0  (1.753 m)   Wt 169 lb (76.7 kg)   SpO2 95%   BMI 24.96 kg/m    Wt Readings from Last 3 Encounters:  04/10/22 169 lb (76.7 kg)  01/29/22 163 lb (73.9 kg)  01/24/22 167 lb (75.8 kg)    Physical Exam Vitals and nursing note reviewed.  Constitutional:      General: He is not in acute distress.    Appearance: Normal appearance. He is not ill-appearing, toxic-appearing or diaphoretic.  HENT:     Head: Normocephalic and atraumatic.     Right Ear: Decreased hearing noted.     Left Ear: Decreased hearing noted.     Ears:     Comments: Bilateral hearing aids    Mouth/Throat:     Mouth: Mucous membranes are moist.  Eyes:     Conjunctiva/sclera: Conjunctivae normal.     Pupils: Pupils are equal, round,  and reactive to light.  Cardiovascular:  Rate and Rhythm: Normal rate and regular rhythm.     Pulses:          Dorsalis pedis pulses are 2+ on the right side and 2+ on the left side.       Posterior tibial pulses are 2+ on the right side and 2+ on the left side.     Heart sounds: Normal heart sounds.  Pulmonary:     Effort: Pulmonary effort is normal.     Breath sounds: Normal breath sounds.  Musculoskeletal:     Cervical back: Neck supple.     Right lower leg: No edema.     Left lower leg: No edema.  Feet:     Right foot:     Protective Sensation: 10 sites tested.  5 sites sensed.     Skin integrity: Skin integrity normal.     Left foot:     Protective Sensation: 10 sites tested.  5 sites sensed.     Skin integrity: Skin integrity normal.  Skin:    General: Skin is warm and dry.     Capillary Refill: Capillary refill takes less than 2 seconds.  Neurological:     General: No focal deficit present.     Mental Status: He is alert and oriented to person, place, and time.  Psychiatric:        Mood and Affect: Mood normal.        Behavior: Behavior normal.        Thought Content: Thought content normal.        Judgment: Judgment normal.     Results for orders placed or performed in visit on 04/10/22  Bayer DCA Hb A1c Waived  Result Value Ref Range   HB A1C (BAYER DCA - WAIVED) 8.3 (H) 4.8 - 5.6 %       Pertinent labs & imaging results that were available during my care of the patient were reviewed by me and considered in my medical decision making.  Assessment & Plan:  Joe [name] was seen today for medical management of chronic issues.  Diagnoses and all orders for this visit:  Type 2 diabetes mellitus with diabetic neuropathy, with long-term current use of insulin ([place]) A1C 8.3 in office today. Has been concerned about low readings during the night. Has not been eating a bedtime snack. Discussed importance of nighttime snack with insulin dosing. Aware to continue nighttime  insulin. Diet and exercise encouraged. Other labs pending.  -     Bayer DCA Hb A1c Waived -     CBC with Differential/Platelet -     CMP14+EGFR -     Lipid panel -     Thyroid Panel With TSH  CKD stage 3 due to type 2 diabetes mellitus ([place]) Labs pending. Continue ARB therapy.  -     CBC with Differential/Platelet -     CMP14+EGFR  Hypertension associated with diabetes ([place]) BP well controlled. Changes were not made in regimen today. Goal BP is 130/80. Pt aware to report any persistent high or low readings. DASH diet and exercise encouraged. Exercise at least 150 minutes per week and increase as tolerated. Goal BMI > 25. Stress management encouraged. Avoid nicotine and tobacco product use. Avoid excessive alcohol and NSAID's. Avoid more than 2000 mg of sodium daily. Medications as prescribed. Follow up as scheduled.  -     CBC with Differential/Platelet -     CMP14+EGFR -     Lipid panel -     Thyroid Panel  With TSH  Hyperlipidemia associated with type 2 diabetes mellitus ([place]) Diet encouraged - increase intake of fresh fruits and vegetables, increase intake of lean proteins. Bake, broil, or grill foods. Avoid fried, greasy, and fatty foods. Avoid fast foods. Increase intake of fiber-rich whole grains. Exercise encouraged - at least 150 minutes per week and advance as tolerated.  Goal BMI < 25. Continue medications as prescribed. Follow up in 3-6 months as discussed.  -     CMP14+EGFR -     Lipid panel  Need for immunization against influenza -     Flu Vaccine QUAD High Dose(Fluad)     Continue all other maintenance medications.  Follow up plan: Return in about 3 months (around 07/11/2022) for DM.   Continue healthy lifestyle choices, including diet (rich in fruits, vegetables, and lean proteins, and low in salt and simple carbohydrates) and exercise (at least 30 minutes of moderate physical activity daily).  Educational handout given for DM  The above assessment and management  plan was discussed with the patient. The patient verbalized understanding of and has agreed to the management plan. Patient is aware to call the clinic if they develop any new symptoms or if symptoms persist or worsen. Patient is aware when to return to the clinic for a follow-up visit. Patient educated on when it is appropriate to go to the emergency department.   Monia Pouch, FNP-C [facility] [phone]

## 2022-04-11 LAB — CMP14+EGFR
ALT: 12 IU/L (ref 0–44)
AST: 13 IU/L (ref 0–40)
Albumin/Globulin Ratio: 2.2 (ref 1.2–2.2)
Albumin: 4.2 g/dL (ref 3.8–4.8)
Alkaline Phosphatase: 59 IU/L (ref 44–121)
BUN/Creatinine Ratio: 13 (ref 10–24)
BUN: 16 mg/dL (ref 8–27)
Bilirubin Total: 0.4 mg/dL (ref 0.0–1.2)
CO2: 22 mmol/L (ref 20–29)
Calcium: 9.2 mg/dL (ref 8.6–10.2)
Chloride: 101 mmol/L (ref 96–106)
Creatinine, Ser: 1.21 mg/dL (ref 0.76–1.27)
Globulin, Total: 1.9 g/dL (ref 1.5–4.5)
Glucose: 180 mg/dL — ABNORMAL HIGH (ref 70–99)
Potassium: 4.6 mmol/L (ref 3.5–5.2)
Sodium: 136 mmol/L (ref 134–144)
Total Protein: 6.1 g/dL (ref 6.0–8.5)
eGFR: 61 mL/min/{1.73_m2} (ref >59)

## 2022-04-11 LAB — CBC WITH DIFFERENTIAL/PLATELET
Basophils Absolute: 0.1 10*3/uL (ref 0.0–0.2)
Basos: 1 %
EOS (ABSOLUTE): 0.5 10*3/uL — ABNORMAL HIGH (ref 0.0–0.4)
Eos: 9 %
Hematocrit: 34.5 % — ABNORMAL LOW (ref 37.5–51.0)
Hemoglobin: 11.6 g/dL — ABNORMAL LOW (ref 13.0–17.7)
Immature Grans (Abs): 0 10*3/uL (ref 0.0–0.1)
Immature Granulocytes: 0 %
Lymphocytes Absolute: 0.9 10*3/uL (ref 0.7–3.1)
Lymphs: 17 %
MCH: 31.8 pg (ref 26.6–33.0)
MCHC: 33.6 g/dL (ref 31.5–35.7)
MCV: 95 fL (ref 79–97)
Monocytes Absolute: 0.5 10*3/uL (ref 0.1–0.9)
Monocytes: 10 %
Neutrophils Absolute: 3.2 10*3/uL (ref 1.4–7.0)
Neutrophils: 63 %
Platelets: 131 10*3/uL — ABNORMAL LOW (ref 150–450)
RBC: 3.65 x10E6/uL — ABNORMAL LOW (ref 4.14–5.80)
RDW: 12.8 % (ref 11.6–15.4)
WBC: 5.2 10*3/uL (ref 3.4–10.8)

## 2022-04-11 LAB — LIPID PANEL
Chol/HDL Ratio: 2.2 ratio (ref 0.0–5.0)
Cholesterol, Total: 115 mg/dL (ref 100–199)
HDL: 52 mg/dL (ref >39)
LDL Chol Calc (NIH): 45 mg/dL (ref 0–99)
Triglycerides: 94 mg/dL (ref 0–149)
VLDL Cholesterol Cal: 18 mg/dL (ref 5–40)

## 2022-04-11 LAB — THYROID PANEL WITH TSH
Free Thyroxine Index: 1.8 (ref 1.2–4.9)
T3 Uptake Ratio: 30 % (ref 24–39)
T4, Total: 5.9 ug/dL (ref 4.5–12.0)
TSH: 3.44 u[IU]/mL (ref 0.450–4.500)

## 2022-05-01 ENCOUNTER — Other Ambulatory Visit: Payer: Self-pay | Admitting: Family Medicine

## 2022-05-01 DIAGNOSIS — E1140 Type 2 diabetes mellitus with diabetic neuropathy, unspecified: Secondary | ICD-10-CM

## 2022-05-01 DIAGNOSIS — E1169 Type 2 diabetes mellitus with other specified complication: Secondary | ICD-10-CM

## 2022-05-01 DIAGNOSIS — I700 Atherosclerosis of aorta: Secondary | ICD-10-CM

## 2022-05-07 DIAGNOSIS — L84 Corns and callosities: Secondary | ICD-10-CM | POA: Diagnosis not present

## 2022-05-07 DIAGNOSIS — M79676 Pain in unspecified toe(s): Secondary | ICD-10-CM | POA: Diagnosis not present

## 2022-05-07 DIAGNOSIS — E1142 Type 2 diabetes mellitus with diabetic polyneuropathy: Secondary | ICD-10-CM | POA: Diagnosis not present

## 2022-05-07 DIAGNOSIS — B351 Tinea unguium: Secondary | ICD-10-CM | POA: Diagnosis not present

## 2022-05-14 ENCOUNTER — Other Ambulatory Visit: Payer: Self-pay | Admitting: Family

## 2022-05-14 ENCOUNTER — Ambulatory Visit: Payer: Medicare PPO | Admitting: Pharmacist

## 2022-05-14 ENCOUNTER — Ambulatory Visit: Payer: Medicare PPO | Admitting: Family

## 2022-05-14 ENCOUNTER — Encounter: Payer: Self-pay | Admitting: Family

## 2022-05-14 ENCOUNTER — Telehealth: Payer: Self-pay

## 2022-05-14 DIAGNOSIS — R6889 Other general symptoms and signs: Secondary | ICD-10-CM

## 2022-05-14 LAB — VERITOR FLU A/B WAIVED
Influenza A: NEGATIVE
Influenza B: NEGATIVE

## 2022-05-14 MED ORDER — PREDNISONE 20 MG PO TABS: 40.0000 mg | ORAL_TABLET | Freq: Every day | ORAL | 0 refills | Status: AC

## 2022-05-14 NOTE — Patient Instructions (Signed)

## 2022-05-14 NOTE — Progress Notes (Signed)
  Care Coordination Note  05/14/2022 Name: [name] MRN: [id number] DOB: 19-Oct-1940  Joe [name] is a 81 y.o. year old male who is a primary care patient of Rakes, Connye Burkitt, FNP and is actively engaged with the care management team. I reached out to Hetty Blend by phone today to assist with re-scheduling an initial visit with the Pharmacist  Follow up plan: Unsuccessful telephone outreach attempt made. A HIPAA compliant phone message was left for the patient providing contact information and requesting a return call.  The care management team will reach out to the patient again over the next 7 days.  If patient returns call to provider office, please advise to call [facility]  at [place], [place], [place] [address] Direct Dial: [phone] [email or web address]

## 2022-05-14 NOTE — Progress Notes (Signed)
Virtual Visit  Note Due to COVID-19 pandemic this visit was conducted virtually. This visit type was conducted due to national recommendations for restrictions regarding the COVID-19 Pandemic (e.g. social distancing, sheltering in place) in an effort to limit this patient's exposure and mitigate transmission in our community. All issues noted in this document were discussed and addressed.  A physical exam was not performed with this format.  I connected with Joe [name] on 05/14/22 at 12:49 pm by telephone and verified that I am speaking with the correct person using two identifiers. JOHNE [name] is currently located at home and wife is currently with him during visit. The provider, Evelina Dun, FNP is located in their office at time of visit.  I discussed the limitations, risks, security and privacy concerns of performing an evaluation and management service by telephone and the availability of in person appointments. I also discussed with the patient that there may be a patient responsible charge related to this service. The patient expressed understanding and agreed to proceed.  Mr. Joe [name], Joe [name] are scheduled for a virtual visit with your provider today.    Just as we do with appointments in the office, we must obtain your consent to participate.  Your consent will be active for this visit and any virtual visit you may have with one of our providers in the next 365 days.    If you have a MyChart account, I can also send a copy of this consent to you electronically.  All virtual visits are billed to your insurance company just like a traditional visit in the office.  As this is a virtual visit, video technology does not allow for your provider to perform a traditional examination.  This may limit your provider's ability to fully assess your condition.  If your provider identifies any concerns that need to be evaluated in person or the need to arrange testing such as labs, EKG, etc, we will make  arrangements to do so.    Although advances in technology are sophisticated, we cannot ensure that it will always work on either your end or our end.  If the connection with a video visit is poor, we may have to switch to a telephone visit.  With either a video or telephone visit, we are not always able to ensure that we have a secure connection.   I need to obtain your verbal consent now.   Are you willing to proceed with your visit today?   Joe [name] has provided verbal consent on 05/14/2022 for a virtual visit (video or telephone).   Evelina Dun, [place] 05/14/2022  12:50 PM    History and Present Illness:  Cough This is a new problem. The current episode started yesterday. The problem has been unchanged. The problem occurs every few minutes. Associated symptoms include a fever, headaches, myalgias, nasal congestion, postnasal drip, rhinorrhea and a sore throat. Pertinent negatives include no chills, ear congestion, ear pain, shortness of breath or wheezing. Associated symptoms comments: sneezing. He has tried rest and OTC cough suppressant for the symptoms. The treatment provided mild relief.      Review of Systems  Constitutional:  Positive for fever. Negative for chills.  HENT:  Positive for postnasal drip, rhinorrhea and sore throat. Negative for ear pain.   Respiratory:  Positive for cough. Negative for shortness of breath and wheezing.   Musculoskeletal:  Positive for myalgias.  Neurological:  Positive for headaches.     Observations/Objective: No SOB or distress  noted   Assessment and Plan: 1. Flu-like symptoms Rest Force fluids Tylenol as needed  Will do home COVID test and let us know  If negative will treat for flu Follow up if symptoms worsen or do not improve  - Veritor Flu A/B Waived      I discussed the assessment and treatment plan with the patient. The patient was provided an opportunity to ask questions and all were answered. The patient agreed with  the plan and demonstrated an understanding of the instructions.   The patient was advised to call back or seek an in-person evaluation if the symptoms worsen or if the condition fails to improve as anticipated.  The above assessment and management plan was discussed with the patient. The patient verbalized understanding of and has agreed to the management plan. Patient is aware to call the clinic if symptoms persist or worsen. Patient is aware when to return to the clinic for a follow-up visit. Patient educated on when it is appropriate to go to the emergency department.   Time call ended:  1:00 pm   I provided 11 minutes of  non face-to-face time during this encounter.    Evelina Dun, FNP

## 2022-05-16 ENCOUNTER — Telehealth: Payer: Self-pay | Admitting: Family Medicine

## 2022-05-23 ENCOUNTER — Ambulatory Visit: Payer: Medicare PPO | Admitting: Family Medicine

## 2022-06-05 ENCOUNTER — Other Ambulatory Visit: Payer: Self-pay | Admitting: Family Medicine

## 2022-06-05 DIAGNOSIS — E1140 Type 2 diabetes mellitus with diabetic neuropathy, unspecified: Secondary | ICD-10-CM

## 2022-06-17 ENCOUNTER — Telehealth: Payer: Self-pay | Admitting: Family Medicine

## 2022-06-17 NOTE — Progress Notes (Signed)
  Care Coordination Note  06/17/2022 Name: RYOTT RAFFERTY MRN: [id number] DOB: May 18, 1941  Joe [name] is a 82 y.o. year old male who is a primary care patient of Rakes, Connye Burkitt, FNP and is actively engaged with the care management team. I reached out to Hetty Blend by phone today to assist with re-scheduling an initial visit with the Pharmacist  Follow up plan: Face to Face appointment with care management team member scheduled for: 06/25/2022  Noreene Larsson, [place], [place] [address] Direct Dial: [phone] [email or web address]

## 2022-06-20 ENCOUNTER — Telehealth: Payer: Self-pay | Admitting: Family Medicine

## 2022-06-20 NOTE — Telephone Encounter (Signed)
Returned call to patient Increase fiasp lunch time to up to 14 units Increase tresiba to 22 units at night Will f/u next tuesday

## 2022-06-25 ENCOUNTER — Ambulatory Visit: Payer: Medicare PPO | Admitting: Pharmacist

## 2022-06-25 VITALS — BP 125/75 | HR 80

## 2022-06-25 DIAGNOSIS — N183 Chronic kidney disease, stage 3 unspecified: Secondary | ICD-10-CM | POA: Diagnosis not present

## 2022-06-25 DIAGNOSIS — E1122 Type 2 diabetes mellitus with diabetic chronic kidney disease: Secondary | ICD-10-CM

## 2022-06-25 DIAGNOSIS — Z794 Long term (current) use of insulin: Secondary | ICD-10-CM | POA: Diagnosis not present

## 2022-06-25 DIAGNOSIS — E1140 Type 2 diabetes mellitus with diabetic neuropathy, unspecified: Secondary | ICD-10-CM | POA: Diagnosis not present

## 2022-06-25 NOTE — Progress Notes (Signed)
    S:    Chief Complaint  Patient presents with   Medication Management    Diabetes   82 y.o. male who presents for diabetes evaluation, education, and management.  PMH is significant for T2DM, HTN, GERD, CKD, and HLD.  Patient was last seen by Primary Care Provider, Darla Lesches, [place], on 04/10/2022.  At last visit, A1c was 8.3%.   Today, patient arrives in good spirits and presents with his wife.   Current diabetes medications include: Jardiance 25 mg daily, Tresiba 20 units QPM, Fiasp 5 unit with breakfast and 10-12 units prior to lunch, metformin 1000 mg BID, Januvia 100 mg daily   Patient reports adherence to taking all medications as prescribed.   Insurance coverage: [employer]  Patient denies hypoglycemic events.  Patient denies nocturia (nighttime urination).  Patient reports neuropathy (nerve pain). Patient denies visual changes. Patient reports self foot exams.   O: 06/12/22-06/25/22 CGM Download:  % Time CGM is active: 93% Average Glucose: 193 mg/dL Time in Goal:  - Time in range 70-180: 42%% - Time above range: 58% - Time below range: 0%   Lab Results  Component Value Date   HGBA1C 8.3 (H) 04/10/2022    Lipid Panel     Component Value Date/Time   CHOL 115 04/10/2022 1005   CHOL 105 12/24/2012 0850   TRIG 94 04/10/2022 1005   TRIG 207 (H) 10/29/2016 1100   TRIG 46 12/24/2012 0850   HDL 52 04/10/2022 1005   HDL 34 (L) 10/29/2016 1100   HDL 44 12/24/2012 0850   CHOLHDL 2.2 04/10/2022 1005   CHOLHDL 2.8 05/26/2016 0349   VLDL 20 05/26/2016 0349   LDLCALC 45 04/10/2022 1005   LDLCALC 59 02/03/2014 0840   LDLCALC 52 12/24/2012 0850    A/P: Diabetes longstanding currently above goal based on A1c (8.3%). Patient is able to verbalize appropriate hypoglycemia management plan. Medication adherence appears appropriate. Post prandial excursion seen on CGM data. No hypoglycemic events noted.  -Increased dose of basal insulin Tresiba from 20 to 22 units  daily. -Adjusted dose of rapid insulin Fiasp to 10-12 units prior to breakfast and 5 units prior to lunch given breakfast is his largest meal of the day. May need to titrate to 10 units BID in the future.  -Continued SGLT2-I Jardiance 25 mg daily. Counseled on sick day rules. -Continued metformin 1000 mg BID.  -Continued Januvia 100 mg daily.  -Patient educated on purpose, proper use, and potential adverse effects of insulin.  -Extensively discussed pathophysiology of diabetes, recommended lifestyle interventions, dietary effects on blood sugar control.  -Counseled on s/sx of and management of hypoglycemia.  -Next A1c anticipated Feburary 2024.   Written patient instructions provided. Patient verbalized understanding of treatment plan.  Total time in face to face counseling 30 minutes.    Follow-up:  PCP clinic visit on 07/11/2022.   Joseph Art, Pharm.D. PGY-2 Ambulatory Care Pharmacy Resident 06/25/2022 2:54 PM  Regina Eck, PharmD, BCPS, BCACP Clinical Pharmacist, [place]  II  T [phone]

## 2022-07-03 ENCOUNTER — Encounter: Payer: Self-pay | Admitting: Pharmacist

## 2022-07-11 ENCOUNTER — Encounter: Payer: Self-pay | Admitting: Family Medicine

## 2022-07-11 ENCOUNTER — Ambulatory Visit: Payer: Medicare PPO | Admitting: Family Medicine

## 2022-07-11 VITALS — BP 130/66 | HR 56 | Temp 97.5°F | Ht 69.0 in | Wt 166.2 lb

## 2022-07-11 DIAGNOSIS — Z794 Long term (current) use of insulin: Secondary | ICD-10-CM

## 2022-07-11 DIAGNOSIS — I152 Hypertension secondary to endocrine disorders: Secondary | ICD-10-CM

## 2022-07-11 DIAGNOSIS — E1159 Type 2 diabetes mellitus with other circulatory complications: Secondary | ICD-10-CM | POA: Diagnosis not present

## 2022-07-11 DIAGNOSIS — K219 Gastro-esophageal reflux disease without esophagitis: Secondary | ICD-10-CM

## 2022-07-11 DIAGNOSIS — E1169 Type 2 diabetes mellitus with other specified complication: Secondary | ICD-10-CM

## 2022-07-11 DIAGNOSIS — E1140 Type 2 diabetes mellitus with diabetic neuropathy, unspecified: Secondary | ICD-10-CM

## 2022-07-11 DIAGNOSIS — I700 Atherosclerosis of aorta: Secondary | ICD-10-CM

## 2022-07-11 DIAGNOSIS — E785 Hyperlipidemia, unspecified: Secondary | ICD-10-CM

## 2022-07-11 DIAGNOSIS — K141 Geographic tongue: Secondary | ICD-10-CM | POA: Insufficient documentation

## 2022-07-11 LAB — CMP14+EGFR
ALT: 10 IU/L (ref 0–44)
AST: 13 IU/L (ref 0–40)
Albumin/Globulin Ratio: 2 (ref 1.2–2.2)
Albumin: 4.3 g/dL (ref 3.7–4.7)
Alkaline Phosphatase: 62 IU/L (ref 44–121)
BUN/Creatinine Ratio: 22 (ref 10–24)
BUN: 32 mg/dL — ABNORMAL HIGH (ref 8–27)
Bilirubin Total: 0.4 mg/dL (ref 0.0–1.2)
CO2: 21 mmol/L (ref 20–29)
Calcium: 9 mg/dL (ref 8.6–10.2)
Chloride: 96 mmol/L (ref 96–106)
Creatinine, Ser: 1.45 mg/dL — ABNORMAL HIGH (ref 0.76–1.27)
Globulin, Total: 2.1 g/dL (ref 1.5–4.5)
Glucose: 329 mg/dL — ABNORMAL HIGH (ref 70–99)
Potassium: 4.9 mmol/L (ref 3.5–5.2)
Sodium: 133 mmol/L — ABNORMAL LOW (ref 134–144)
Total Protein: 6.4 g/dL (ref 6.0–8.5)
eGFR: 48 mL/min/{1.73_m2} — ABNORMAL LOW (ref >59)

## 2022-07-11 LAB — CBC WITH DIFFERENTIAL/PLATELET
Basophils Absolute: 0.1 10*3/uL (ref 0.0–0.2)
Basos: 1 %
EOS (ABSOLUTE): 0.2 10*3/uL (ref 0.0–0.4)
Eos: 3 %
Hematocrit: 37.1 % — ABNORMAL LOW (ref 37.5–51.0)
Hemoglobin: 12.2 g/dL — ABNORMAL LOW (ref 13.0–17.7)
Immature Grans (Abs): 0 10*3/uL (ref 0.0–0.1)
Immature Granulocytes: 0 %
Lymphocytes Absolute: 1.1 10*3/uL (ref 0.7–3.1)
Lymphs: 18 %
MCH: 30.9 pg (ref 26.6–33.0)
MCHC: 32.9 g/dL (ref 31.5–35.7)
MCV: 94 fL (ref 79–97)
Monocytes Absolute: 0.6 10*3/uL (ref 0.1–0.9)
Monocytes: 9 %
Neutrophils Absolute: 4.4 10*3/uL (ref 1.4–7.0)
Neutrophils: 69 %
Platelets: 153 10*3/uL (ref 150–450)
RBC: 3.95 x10E6/uL — ABNORMAL LOW (ref 4.14–5.80)
RDW: 12.8 % (ref 11.6–15.4)
WBC: 6.3 10*3/uL (ref 3.4–10.8)

## 2022-07-11 LAB — BAYER DCA HB A1C WAIVED: HB A1C (BAYER DCA - WAIVED): 8.4 % — ABNORMAL HIGH (ref 4.8–5.6)

## 2022-07-11 MED ORDER — ESOMEPRAZOLE MAGNESIUM 40 MG PO CPDR: DELAYED_RELEASE_CAPSULE | ORAL | 1 refills | Status: DC

## 2022-07-11 MED ORDER — METFORMIN HCL ER 500 MG PO TB24: ORAL_TABLET | ORAL | 1 refills | Status: DC

## 2022-07-11 MED ORDER — SIMVASTATIN 40 MG PO TABS: ORAL_TABLET | ORAL | 1 refills | Status: DC

## 2022-07-11 MED ORDER — SITAGLIPTIN PHOSPHATE 100 MG PO TABS: 100.0000 mg | ORAL_TABLET | Freq: Every day | ORAL | 1 refills | Status: DC

## 2022-07-11 MED ORDER — FIASP FLEXTOUCH 100 UNIT/ML ~~LOC~~ SOPN: 14.0000 [IU] | PEN_INJECTOR | Freq: Every day | SUBCUTANEOUS | 11 refills | Status: AC

## 2022-07-11 NOTE — Patient Instructions (Signed)

## 2022-07-11 NOTE — Progress Notes (Signed)
Subjective:  [name], male    DOB: Sep 01, 1940, 82 y.o.   MRN: [id number]  Patient Care Team: Baruch Gouty, FNP as PCP - General (Family Medicine) Irine Seal, MD (Urology) Gatha Mayer, MD (Gastroenterology) Minus Breeding, MD as Consulting Physician (Cardiology) Lavera Guise, [facility] (Pharmacist) Harlen Labs, MD as Referring Physician (Optometry) Blanca Friend Royce Macadamia, [facility] as Pharmacist (Family Medicine)   Chief Complaint:  Diabetes (3 month follow up )   HPI: Joe [name] is a 82 y.o. male presenting on 07/11/2022 for Diabetes (3 month follow up )    1. Type 2 diabetes mellitus with diabetic neuropathy, with long-term current use of insulin (HCC) Pt is followed by Lottie Dawson, PharmD, who helps with management of diabetes. He has recently started on Fiasp and has been tolerating. Well states he has had a few dips in his blood sugars during the night this week. States he was alarmed that BS was 65 around 0200. He reported not having an evening snack prior to be on these nights. States once he ate, BS came back up. He denies polyuria, polyphagia, or polydipsia.   2. Hypertension associated with diabetes ([employer]) Complaint with meds - Yes Current Medications - Benicar Checking BP at home ranging 120-140/50-70 Exercising Regularly - Yes Watching Salt intake - Yes Pertinent ROS:  Headache - No Fatigue - No Visual Disturbances - No Chest pain - No Dyspnea - No Palpitations - No LE edema - No They report good compliance with medications and can restate their regimen by memory. No medication side effects.  BP Readings from Last 3 Encounters:  07/11/22 130/66  07/03/22 125/75  04/10/22 (!) 104/53     3. Hyperlipidemia associated with type 2 diabetes mellitus ([place]) Compliant with medications - Yes Current medications - Simvastatin Side effects from medications - No Diet - healthy  Exercise - active daily   4. Gastroesophageal reflux disease  without esophagitis Compliant with medications - Yes Current medications - Nexium Adverse side effects - No Cough - No Sore throat - No Voice change - No Hemoptysis - No Dysphagia or dyspepsia - No Water brash - No Red Flags (weight loss, hematochezia, melena, weight loss, early satiety, fevers, odynophagia, or persistent vomiting) - No  5. Abdominal aortic atherosclerosis (HCC) On ASA and statin therapy and tolerating well. Denies anginal symptoms.    Relevant past medical, surgical, family, and social history reviewed and updated as indicated.  Allergies and medications reviewed and updated. Data reviewed: Chart in Epic.   Past Medical History:  Diagnosis Date   Adenomatous colon polyp    Arthritis    Cataract    Chest pain at rest 05/26/2016   Diabetes mellitus without complication ([place])    Dyslipidemia    ESOPHAGITIS, REFLUX 07/09/2004   Qualifier: Diagnosis of  By: Hardin Negus CMA Deborra Medina), Stephanie     Hiatal hernia    HOH (hard of hearing)    has bilateral Hearing aids   Hypertension    Irritable bowel syndrome    Kidney stone 1970   Pneumonia    Tick bites    took 5 rounds of Doxycycline this summer    Past Surgical History:  Procedure Laterality Date   COLONOSCOPY     ESOPHAGEAL DILATION  1996   ESOPHAGEAL DILATION  2003   FLEXIBLE SIGMOIDOSCOPY     HEMICOLECTOMY  08/2005   Right   KNEE SURGERY Right    TONSILLECTOMY  Social History   Socioeconomic History   Marital status: Married    Spouse name: [name]   Number of children: 1   Years of education: 16   Highest education level: Bachelor's degree (e.g., BA, AB, BS)  Occupational History   Occupation: Retired Pharmacist, hospital  Tobacco Use   Smoking status: Former    Packs/day: 2.00    Types: Cigarettes    Start date: 06/03/1954    Quit date: 08/24/1984    Years since quitting: 37.9   Smokeless tobacco: Never   Tobacco comments:    Has not used to tobacco products for the past 20 years  Vaping Use    Vaping Use: Never used  Substance and Sexual Activity   Alcohol use: Yes    Alcohol/week: 7.0 standard drinks of alcohol    Types: 7 Standard drinks or equivalent per week    Comment: [place] at night when he sits on the deck   Drug use: No   Sexual activity: Yes    Partners: Female  Other Topics Concern   Not on file  Social History Narrative   Lives in [place] with wife, both retired teachers   1 son   3 caffeinated beverages daily   1 alcoholic beverage daily   Former smoker no current tobacco no drug use   Social Determinants of Radio broadcast assistant Strain: Low Risk  (04/04/2021)   Overall Financial Resource Strain (CARDIA)    Difficulty of Paying Living Expenses: Not hard at all  Food Insecurity: No Food Insecurity (04/04/2021)   Hunger Vital Sign    Worried About Running Out of Food in the Last Year: Never true    [place] in the Last Year: Never true  Transportation Needs: No Transportation Needs (04/04/2021)   PRAPARE - Hydrologist (Medical): No    Lack of Transportation (Non-Medical): No  Physical Activity: Insufficiently Active (04/04/2021)   Exercise Vital Sign    Days of Exercise per Week: 3 days    Minutes of Exercise per Session: 20 min  Stress: No Stress Concern Present (04/04/2021)   [place]    Feeling of Stress : Not at all  Social Connections: Moderately Isolated (04/04/2021)   Social Connection and Isolation Panel [NHANES]    Frequency of Communication with Friends and Family: More than three times a week    Frequency of Social Gatherings with Friends and Family: Once a week    Attends Religious Services: Never    Marine scientist or Organizations: No    Attends Archivist Meetings: Never    Marital Status: Married  Human resources officer Violence: Not At Risk (04/04/2021)   Humiliation, Afraid, Rape, and Kick questionnaire    Fear of  Current or Ex-Partner: No    Emotionally Abused: No    Physically Abused: No    Sexually Abused: No    Outpatient Encounter Medications as of 07/11/2022  Medication Sig   Accu-Chek Softclix Lancets lancets CHECK BLOOD SUGAR 4 TIMES A DAY OR AS DIRECTED   aspirin 81 MG tablet Take 81 mg by mouth daily.   Blood Glucose Monitoring Suppl w/Device KIT Test BS BID and PRN dx E11.9. Give test strips and lancets to match machine given   Chlorphen-PE-Acetaminophen 4-10-325 MG TABS Take 1 tablet by mouth every 6 (six) hours as needed.   cholecalciferol (VITAMIN D) 1000 UNITS tablet Take 2,000 Units by mouth  daily.   Cinnamon 500 MG TABS Take 1,000 mg by mouth daily.   Continuous Blood Gluc Receiver (FREESTYLE LIBRE 2 READER) DEVI Use to test blood sugars up to 6 times daily as directed. DX: E11.9   Continuous Blood Gluc Sensor (FREESTYLE LIBRE 2 SENSOR) MISC USE AS DIRECTED UP TO SIX TIMES DAILY. CHANGE EVERY 14 DAYS   diclofenac Sodium (VOLTAREN) 1 % GEL Apply 2 g topically 4 (four) times daily.   doxylamine, Sleep, (UNISOM) 25 MG tablet Take 25 mg by mouth at bedtime as needed for sleep.    empagliflozin (JARDIANCE) 25 MG TABS tablet TAKE ONE TABLET BY MOUTH EVERY MORNING   glucose blood (ACCU-CHEK GUIDE) test strip Check blood sugar 4 times daily Dx E 11.40   insulin aspart (FIASP FLEXTOUCH) 100 UNIT/ML FlexTouch Pen Inject 14 Units into the skin daily at 2 PM for 30 doses. 14 units daily at lunch time   Insulin Pen Needle (PEN NEEDLES) 32G X 5 MM MISC 1 Device by Does not apply route daily.   MELATONIN PO Take by mouth.   Multiple Vitamin (MULTI VITAMIN) TABS 1 tablet Orally Once a day for 30 day(s)   nystatin cream (MYCOSTATIN) Apply 1 Application topically 2 (two) times daily.   olmesartan (BENICAR) 20 MG tablet TAKE 1 TABLET ONCE DAILY   Probiotic Product (PROBIOTIC PO) Take 1 tablet by mouth daily.   TRESIBA FLEXTOUCH 100 UNIT/ML FlexTouch Pen Inject 0.06-0.2 mLs (6-20 Units total) into the  skin daily at 10 pm. (Patient taking differently: Inject 22 Units into the skin daily at 10 pm.)   [DISCONTINUED] esomeprazole (NEXIUM) 40 MG capsule TAKE (1) CAPSULE DAILY   [DISCONTINUED] Insulin Aspart, w/Niacinamide, (FIASP IJ) Inject as directed.   [DISCONTINUED] JANUVIA 100 MG tablet TAKE ONE TABLET DAILY   [DISCONTINUED] metFORMIN (GLUCOPHAGE-XR) 500 MG 24 hr tablet TAKE 2 TABLETS 2 TIMES A DAY WITH MEALS   [DISCONTINUED] simvastatin (ZOCOR) 40 MG tablet TAKE 1 TABLET ONCE DAILY IN THE EVENING   esomeprazole (NEXIUM) 40 MG capsule 1 capsule daily   metFORMIN (GLUCOPHAGE-XR) 500 MG 24 hr tablet TAKE 2 TABLETS 2 TIMES A DAY WITH MEALS   simvastatin (ZOCOR) 40 MG tablet TAKE 1 TABLET ONCE DAILY IN THE EVENING   sitaGLIPtin (JANUVIA) 100 MG tablet Take 1 tablet (100 mg total) by mouth daily.   [DISCONTINUED] diphenhydrAMINE (BENADRYL) 25 MG tablet Take 25 mg by mouth every 6 (six) hours as needed.   [DISCONTINUED] insulin lispro (HUMALOG) 100 UNIT/ML injection Inject 10 Units into the skin once.   No facility-administered encounter medications on file as of 07/11/2022.    Allergies  Allergen Reactions   Ace Inhibitors Cough   Trazamine [Trazodone & Diet Manage Prod] Other (See Comments)    nightmares    Review of Systems  Constitutional:  Negative for activity change, appetite change, chills, diaphoresis, fatigue, fever and unexpected weight change.  HENT:  Positive for hearing loss.   Eyes: Negative.  Negative for photophobia and visual disturbance.  Respiratory:  Negative for cough, chest tightness and shortness of breath.   Cardiovascular:  Negative for chest pain, palpitations and leg swelling.  Gastrointestinal:  Negative for abdominal pain, blood in stool, constipation, diarrhea, nausea and vomiting.  Endocrine: Negative.  Negative for polydipsia, polyphagia and polyuria.  Genitourinary:  Negative for decreased urine volume, difficulty urinating, dysuria, frequency and urgency.   Musculoskeletal:  Negative for arthralgias and myalgias.  Skin: Negative.   Allergic/Immunologic: Negative.   Neurological:  Negative for  dizziness, tremors, seizures, syncope, facial asymmetry, speech difficulty, weakness, light-headedness, numbness and headaches.  Hematological: Negative.   Psychiatric/Behavioral:  Negative for confusion, hallucinations, sleep disturbance and suicidal ideas.   All other systems reviewed and are negative.       Objective:  BP 130/66   Pulse (!) 56   Temp (!) 97.5 F (36.4 C) (Temporal)   Ht '5\' 9"'$  (1.753 m)   Wt 166 lb 3.2 oz (75.4 kg)   SpO2 99%   BMI 24.54 kg/m    Wt Readings from Last 3 Encounters:  07/11/22 166 lb 3.2 oz (75.4 kg)  04/10/22 169 lb (76.7 kg)  01/29/22 163 lb (73.9 kg)    Physical Exam Vitals and nursing note reviewed.  Constitutional:      General: He is not in acute distress.    Appearance: Normal appearance. He is well-developed, well-groomed and normal weight. He is not ill-appearing, toxic-appearing or diaphoretic.  HENT:     Head: Normocephalic and atraumatic.     Jaw: There is normal jaw occlusion.     Right Ear: Hearing normal.     Left Ear: Hearing normal.     Ears:     Comments: Bilateral hearing aids    Nose: Nose normal.     Mouth/Throat:     Lips: Pink.     Mouth: Mucous membranes are moist.     Pharynx: Uvula midline.  Eyes:     General: Lids are normal.     Pupils: Pupils are equal, round, and reactive to light.  Neck:     Thyroid: No thyroid mass, thyromegaly or thyroid tenderness.     Vascular: No carotid bruit or JVD.     Trachea: Trachea and phonation normal.  Cardiovascular:     Rate and Rhythm: Normal rate and regular rhythm.     Chest Wall: PMI is not displaced.     Pulses: Normal pulses.     Heart sounds: Normal heart sounds. No murmur heard.    No friction rub. No gallop.  Pulmonary:     Effort: Pulmonary effort is normal. No respiratory distress.     Breath sounds: Normal breath  sounds. No wheezing.  Abdominal:     General: There is no abdominal bruit.     Palpations: There is no hepatomegaly or splenomegaly.  Musculoskeletal:        General: Normal range of motion.     Cervical back: Normal range of motion and neck supple.     Right lower leg: No edema.     Left lower leg: No edema.  Lymphadenopathy:     Cervical: No cervical adenopathy.  Skin:    General: Skin is warm and dry.     Capillary Refill: Capillary refill takes less than 2 seconds.     Coloration: Skin is not cyanotic, jaundiced or pale.     Findings: No rash.  Neurological:     General: No focal deficit present.     Mental Status: He is alert and oriented to person, place, and time.     Sensory: Sensation is intact.     Motor: Motor function is intact.     Coordination: Coordination is intact.     Gait: Gait is intact.     Deep Tendon Reflexes: Reflexes are normal and symmetric.  Psychiatric:        Attention and Perception: Attention and perception normal.        Mood and Affect: Mood and affect normal.  Speech: Speech normal.        Behavior: Behavior normal. Behavior is cooperative.        Thought Content: Thought content normal.        Cognition and Memory: Cognition and memory normal.        Judgment: Judgment normal.     Results for orders placed or performed in visit on 07/11/22  Bayer DCA Hb A1c Waived  Result Value Ref Range   HB A1C (BAYER DCA - WAIVED) 8.4 (H) 4.8 - 5.6 %       Pertinent labs & imaging results that were available during my care of the patient were reviewed by me and considered in my medical decision making.  Assessment & Plan:  Ansar was seen today for diabetes.  Diagnoses and all orders for this visit:  Type 2 diabetes mellitus with diabetic neuropathy, with long-term current use of insulin (HCC) A1C 8.4 today. No adjustments to regimen. Diet and exercise encouraged. Follow up in 3 months for reevaluation.  -     CBC with  Differential/Platelet -     CMP14+EGFR -     Bayer DCA Hb A1c Waived -     sitaGLIPtin (JANUVIA) 100 MG tablet; Take 1 tablet (100 mg total) by mouth daily. -     metFORMIN (GLUCOPHAGE-XR) 500 MG 24 hr tablet; TAKE 2 TABLETS 2 TIMES A DAY WITH MEALS -     insulin aspart (FIASP FLEXTOUCH) 100 UNIT/ML FlexTouch Pen; Inject 14 Units into the skin daily at 2 PM for 30 doses. 14 units daily at lunch time  Hypertension associated with diabetes ([place]) BP well controlled. Changes were not made in regimen today. Goal BP is 130/80. Pt aware to report any persistent high or low readings. DASH diet and exercise encouraged. Exercise at least 150 minutes per week and increase as tolerated. Goal BMI > 25. Stress management encouraged. Avoid nicotine and tobacco product use. Avoid excessive alcohol and NSAID's. Avoid more than 2000 mg of sodium daily. Medications as prescribed. Follow up as scheduled.  -     CBC with Differential/Platelet -     CMP14+EGFR  Hyperlipidemia associated with type 2 diabetes mellitus ([place]) Diet encouraged - increase intake of fresh fruits and vegetables, increase intake of lean proteins. Bake, broil, or grill foods. Avoid fried, greasy, and fatty foods. Avoid fast foods. Increase intake of fiber-rich whole grains. Exercise encouraged - at least 150 minutes per week and advance as tolerated.  Goal BMI < 25. Continue medications as prescribed. Follow up in 3-6 months as discussed.  -     CMP14+EGFR -     simvastatin (ZOCOR) 40 MG tablet; TAKE 1 TABLET ONCE DAILY IN THE EVENING  Gastroesophageal reflux disease without esophagitis No red flags present. Diet discussed. Avoid fried, spicy, fatty, greasy, and acidic foods. Avoid caffeine, nicotine, and alcohol. Do not eat 2-3 hours before bedtime and stay upright for at least 1-2 hours after eating. Eat small frequent meals. Avoid NSAID's like motrin and aleve. Medications as prescribed. Report any new or worsening symptoms. Follow up as  discussed or sooner if needed.   -     CBC with Differential/Platelet -     esomeprazole (NEXIUM) 40 MG capsule; 1 capsule daily     Continue all other maintenance medications.  Follow up plan: Return in about 3 months (around 10/09/2022) for DM.   Continue healthy lifestyle choices, including diet (rich in fruits, vegetables, and lean proteins, and low in salt and simple  carbohydrates) and exercise (at least 30 minutes of moderate physical activity daily).  Educational handout given for DM  The above assessment and management plan was discussed with the patient. The patient verbalized understanding of and has agreed to the management plan. Patient is aware to call the clinic if they develop any new symptoms or if symptoms persist or worsen. Patient is aware when to return to the clinic for a follow-up visit. Patient educated on when it is appropriate to go to the emergency department.   Monia Pouch, FNP-C [facility] [phone]

## 2022-07-16 DIAGNOSIS — L603 Nail dystrophy: Secondary | ICD-10-CM | POA: Diagnosis not present

## 2022-07-16 DIAGNOSIS — E1142 Type 2 diabetes mellitus with diabetic polyneuropathy: Secondary | ICD-10-CM | POA: Diagnosis not present

## 2022-07-16 DIAGNOSIS — L84 Corns and callosities: Secondary | ICD-10-CM | POA: Diagnosis not present

## 2022-07-16 DIAGNOSIS — M79676 Pain in unspecified toe(s): Secondary | ICD-10-CM | POA: Diagnosis not present

## 2022-07-17 ENCOUNTER — Telehealth: Payer: Self-pay | Admitting: Family Medicine

## 2022-07-17 NOTE — Telephone Encounter (Signed)
Patient declined the Medicare Wellness Visit with [facility] per [name]

## 2022-08-05 ENCOUNTER — Other Ambulatory Visit: Payer: Self-pay | Admitting: Family Medicine

## 2022-08-05 DIAGNOSIS — E1140 Type 2 diabetes mellitus with diabetic neuropathy, unspecified: Secondary | ICD-10-CM

## 2022-08-08 ENCOUNTER — Telehealth: Payer: Self-pay | Admitting: Family Medicine

## 2022-08-08 NOTE — Telephone Encounter (Signed)
Patient needs an appt with Almyra Free to discuss insulin.

## 2022-09-05 ENCOUNTER — Telehealth: Payer: Medicare PPO

## 2022-09-24 DIAGNOSIS — L84 Corns and callosities: Secondary | ICD-10-CM | POA: Diagnosis not present

## 2022-09-24 DIAGNOSIS — E1142 Type 2 diabetes mellitus with diabetic polyneuropathy: Secondary | ICD-10-CM | POA: Diagnosis not present

## 2022-09-24 DIAGNOSIS — L603 Nail dystrophy: Secondary | ICD-10-CM | POA: Diagnosis not present

## 2022-09-24 DIAGNOSIS — M79676 Pain in unspecified toe(s): Secondary | ICD-10-CM | POA: Diagnosis not present

## 2022-09-27 ENCOUNTER — Ambulatory Visit: Payer: Medicare PPO

## 2022-10-04 ENCOUNTER — Ambulatory Visit: Payer: Medicare PPO | Admitting: Pharmacist

## 2022-10-04 DIAGNOSIS — Z794 Long term (current) use of insulin: Secondary | ICD-10-CM

## 2022-10-04 DIAGNOSIS — E1140 Type 2 diabetes mellitus with diabetic neuropathy, unspecified: Secondary | ICD-10-CM

## 2022-10-04 MED ORDER — FREESTYLE LIBRE 3 SENSOR MISC: 5 refills | Status: DC

## 2022-10-04 NOTE — Progress Notes (Signed)
       S:        Chief Complaint  Patient presents with   Medication Management      Diabetes    82 y.o. male who presents for diabetes evaluation, education, and management.  PMH is significant for T2DM, HTN, GERD, CKD, and HLD.  Patient was last seen by Primary Care Provider, Gilford Silvius, FNP, on 07/2022 At last visit, A1c was 8.3%.    Today, patient arrives in good spirits and presents with his wife.    Current diabetes medications include: Jardiance 25 mg daily, Tresiba 22 units QPM, Fiasp 10-12 units prior to breakfast and prior to lunch, metformin 1000 mg BID, Januvia 100 mg daily    Patient reports adherence to taking all medications as prescribed.    Insurance coverage: [employer]   Patient denies hypoglycemic events.   Patient denies nocturia (nighttime urination).  Patient reports neuropathy (nerve pain). Patient denies visual changes. Patient reports self foot exams.    O: 4/20-5/3 CGM Download:  % Time CGM is active: 89% Average Glucose: 202 mg/dL Time in Goal:  - Time in range 70-180: 38% - Time above range: 62% - Time below range: 0%     Recent Labs       Lab Results  Component Value Date    HGBA1C 8.3 (H) 04/10/2022        Lipid Panel  Labs (Brief)          Component Value Date/Time    CHOL 115 04/10/2022 1005    CHOL 105 12/24/2012 0850    TRIG 94 04/10/2022 1005    TRIG 207 (H) 10/29/2016 1100    TRIG 46 12/24/2012 0850    HDL 52 04/10/2022 1005    HDL 34 (L) 10/29/2016 1100    HDL 44 12/24/2012 0850    CHOLHDL 2.2 04/10/2022 1005    CHOLHDL 2.8 05/26/2016 0349    VLDL 20 05/26/2016 0349    LDLCALC 45 04/10/2022 1005    LDLCALC 59 02/03/2014 0840    LDLCALC 52 12/24/2012 0850        A/P: Diabetes longstanding currently above goal based on A1c (8.3%). Patient is able to verbalize appropriate hypoglycemia management plan. Medication adherence appears appropriate. Post prandial excursion seen on CGM data. No hypoglycemic events  noted.  -Continue dose of basal insulin Tresiba 22 units daily. -Adjusted dose of rapid insulin Fiasp to 13-14 units prior to breakfast and 13-14 units prior to lunch given breakfast is his largest meal of the day. May need to titrate to 10 units BID in the future.  -Continued SGLT2-I Jardiance 25 mg daily. Counseled on sick day rules. -Continued metformin 1000 mg BID.  -Continued Januvia 100 mg daily.  -Patient educated on purpose, proper use, and potential adverse effects of insulin.  -Extensively discussed pathophysiology of diabetes, recommended lifestyle interventions, dietary effects on blood sugar control.  -Counseled on s/sx of and management of hypoglycemia.  -Next A1c anticipated next week -sample of libre 3 given for patient to try; will allow minute per minute readings and no lapses in scanning   Written patient instructions provided. Patient verbalized understanding of treatment plan.  Total time in face to face counseling 30 minutes.     Follow-up:  PCP clinic visit on 10/10/22    Kieth Brightly, PharmD, BCPS, BCACP Clinical Pharmacist, [facility]  II  T [phone]

## 2022-10-10 ENCOUNTER — Encounter: Payer: Self-pay | Admitting: Family Medicine

## 2022-10-10 ENCOUNTER — Telehealth: Payer: Self-pay | Admitting: Family Medicine

## 2022-10-10 ENCOUNTER — Ambulatory Visit: Payer: Medicare PPO | Admitting: Family Medicine

## 2022-10-10 VITALS — BP 139/75 | HR 61 | Temp 97.5°F | Ht 69.0 in | Wt 171.8 lb

## 2022-10-10 DIAGNOSIS — Z794 Long term (current) use of insulin: Secondary | ICD-10-CM

## 2022-10-10 DIAGNOSIS — K219 Gastro-esophageal reflux disease without esophagitis: Secondary | ICD-10-CM

## 2022-10-10 DIAGNOSIS — R200 Anesthesia of skin: Secondary | ICD-10-CM

## 2022-10-10 DIAGNOSIS — E785 Hyperlipidemia, unspecified: Secondary | ICD-10-CM | POA: Diagnosis not present

## 2022-10-10 DIAGNOSIS — E1169 Type 2 diabetes mellitus with other specified complication: Secondary | ICD-10-CM

## 2022-10-10 DIAGNOSIS — I152 Hypertension secondary to endocrine disorders: Secondary | ICD-10-CM | POA: Diagnosis not present

## 2022-10-10 DIAGNOSIS — E1140 Type 2 diabetes mellitus with diabetic neuropathy, unspecified: Secondary | ICD-10-CM

## 2022-10-10 DIAGNOSIS — E1159 Type 2 diabetes mellitus with other circulatory complications: Secondary | ICD-10-CM

## 2022-10-10 DIAGNOSIS — R202 Paresthesia of skin: Secondary | ICD-10-CM

## 2022-10-10 LAB — BAYER DCA HB A1C WAIVED: HB A1C (BAYER DCA - WAIVED): 8 % — ABNORMAL HIGH (ref 4.8–5.6)

## 2022-10-10 MED ORDER — ESOMEPRAZOLE MAGNESIUM 40 MG PO CPDR: DELAYED_RELEASE_CAPSULE | ORAL | 1 refills | Status: DC

## 2022-10-10 NOTE — Progress Notes (Signed)
Subjective:  Patient ID: EWARD HELOU, male    DOB: 09/07/1940, 82 y.o.   MRN: [id number]  Patient Care Team: Sonny Masters, FNP as PCP - General (Family Medicine) Bjorn Pippin, MD (Urology) Iva Boop, MD (Gastroenterology) Rollene Rotunda, MD as Consulting Physician (Cardiology) Danella Maiers, [facility] (Pharmacist) Michaelle Copas, MD as Referring Physician (Optometry) Cresenciano Genre Lilla Shook, [facility] as Pharmacist (Family Medicine)   Chief Complaint:  Diabetes (3 month follow up )   HPI: CORNELIA TIPPER is a 82 y.o. male presenting on 10/10/2022 for Diabetes (3 month follow up )    1. Type 2 diabetes mellitus with diabetic neuropathy, with long-term current use of insulin (HCC) Pt is compliant with medications and denies polyuria, polyphagia, or polydipsia. No changes in regimen. Has had a fewer lower readings at night, lowest 58. Denies symptoms.    2. Hypertension associated with diabetes (HCC) Complaint with meds - Yes Current Medications - benicar Checking BP at home - yes, looks good Exercising Regularly - No Watching Salt intake - Yes Pertinent ROS:  Headache - No Fatigue - No Visual Disturbances - No Chest pain - No Dyspnea - No Palpitations - No LE edema - No They report good compliance with medications and can restate their regimen by memory. No medication side effects.  BP Readings from Last 3 Encounters:  10/10/22 139/75  07/11/22 130/66  07/03/22 125/75     3. Hyperlipidemia associated with type 2 diabetes mellitus (HCC) Compliant with medications - Yes Current medications - simvastatin Side effects from medications - No Diet - general Exercise - not regular  4. Numbness and tingling of both feet Ongoing and worsening at times. Was seen by the [facility] and has started using a machine but states this is not helping. He is on metformin and has not had a Vit B 12 checked recently.   5. Gastroesophageal reflux disease without esophagitis Compliant with  medications - Yes Current medications - Nexium Adverse side effects - No. Cough - No Sore throat - No Voice change - No Hemoptysis - No Dysphagia or dyspepsia - No Water brash - No Red Flags (weight loss, hematochezia, melena, weight loss, early satiety, fevers, odynophagia, or persistent vomiting) - No      Relevant past medical, surgical, family, and social history reviewed and updated as indicated.  Allergies and medications reviewed and updated. Data reviewed: Chart in Epic.   Past Medical History:  Diagnosis Date   Adenomatous colon polyp    Arthritis    Cataract    Chest pain at rest 05/26/2016   Diabetes mellitus without complication (HCC)    Dyslipidemia    ESOPHAGITIS, REFLUX 07/09/2004   Qualifier: Diagnosis of  By: Vear Clock CMA Duncan Dull), Stephanie     Hiatal hernia    HOH (hard of hearing)    has bilateral Hearing aids   Hypertension    Irritable bowel syndrome    Kidney stone 1970   Pneumonia    Tick bites    took 5 rounds of Doxycycline this summer    Past Surgical History:  Procedure Laterality Date   COLONOSCOPY     ESOPHAGEAL DILATION  1996   ESOPHAGEAL DILATION  2003   FLEXIBLE SIGMOIDOSCOPY     HEMICOLECTOMY  08/2005   Right   KNEE SURGERY Right    TONSILLECTOMY      Social History   Socioeconomic History   Marital status: Married    [name]  Number of children: 1   Years of education: 16   Highest education level: Bachelor's degree (e.g., BA, AB, BS)  Occupational History   Occupation: Retired Runner, broadcasting/film/video  Tobacco Use   Smoking status: Former    Packs/day: 2    Types: Cigarettes    Start date: 06/03/1954    Quit date: 08/24/1984    Years since quitting: 38.1   Smokeless tobacco: Never   Tobacco comments:    Has not used to tobacco products for the past 20 years  Vaping Use   Vaping Use: Never used  Substance and Sexual Activity   Alcohol use: Yes    Alcohol/week: 7.0 standard drinks of alcohol    Types: 7 Standard drinks  or equivalent per week    Comment: Bourbon at night when he sits on the deck   Drug use: No   Sexual activity: Yes    Partners: Female  Other Topics Concern   Not on file  Social History Narrative   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, both retired teachers   1 son   3 caffeinated beverages daily   1 alcoholic beverage daily   Former smoker no current tobacco no drug use   Social Determinants of Corporate investment banker Strain: Low Risk  (04/04/2021)   Overall Financial Resource Strain (CARDIA)    Difficulty of Paying Living Expenses: Not hard at all  Food Insecurity: No Food Insecurity (04/04/2021)   Hunger Vital Sign    Worried About Running Out of Food in the Last Year: Never true    Ran Out of Food in the Last Year: Never true  Transportation Needs: No Transportation Needs (04/04/2021)   PRAPARE - Administrator, Civil Service (Medical): No    Lack of Transportation (Non-Medical): No  Physical Activity: Insufficiently Active (04/04/2021)   Exercise Vital Sign    Days of Exercise per Week: 3 days    Minutes of Exercise per Session: 20 min  Stress: No Stress Concern Present (04/04/2021)   [employer] of Occupational Health - Occupational Stress Questionnaire    Feeling of Stress : Not at all  Social Connections: Moderately Isolated (04/04/2021)   Social Connection and Isolation Panel [NHANES]    Frequency of Communication with Friends and Family: More than three times a week    Frequency of Social Gatherings with Friends and Family: Once a week    Attends Religious Services: Never    Database administrator or Organizations: No    Attends Banker Meetings: Never    Marital Status: Married  Catering manager Violence: Not At Risk (04/04/2021)   Humiliation, Afraid, Rape, and Kick questionnaire    Fear of Current or Ex-Partner: No    Emotionally Abused: No    Physically Abused: No    Sexually Abused: No    Outpatient Encounter Medications as of  10/10/2022  Medication Sig   Accu-Chek Softclix Lancets lancets CHECK BLOOD SUGAR 4 TIMES A DAY OR AS DIRECTED   aspirin 81 MG tablet Take 81 mg by mouth daily.   Blood Glucose Monitoring Suppl w/Device KIT Test BS BID and PRN dx E11.9. Give test strips and lancets to match machine given   cholecalciferol (VITAMIN D) 1000 UNITS tablet Take 2,000 Units by mouth daily.   Cinnamon 500 MG TABS Take 1,000 mg by mouth daily.   Continuous Glucose Sensor (FREESTYLE LIBRE 3 SENSOR) MISC Apply to arm every 14 days. Use to test blood sugar continuously. DX:E11.65  diclofenac Sodium (VOLTAREN) 1 % GEL Apply 2 g topically 4 (four) times daily.   doxylamine, Sleep, (UNISOM) 25 MG tablet Take 25 mg by mouth at bedtime as needed for sleep.    glucose blood (ACCU-CHEK GUIDE) test strip Check blood sugar 4 times daily Dx E 11.40   Insulin Aspart, w/Niacinamide, (FIASP IJ) Inject 13 Units as directed in the morning and at bedtime.   insulin degludec (TRESIBA FLEXTOUCH) 100 UNIT/ML FlexTouch Pen Inject 6-20 Units into the skin daily at 10 pm.   Insulin Pen Needle (PEN NEEDLES) 32G X 5 MM MISC 1 Device by Does not apply route daily.   JARDIANCE 25 MG TABS tablet TAKE ONE TABLET BY MOUTH EVERY MORNING   MELATONIN PO Take by mouth.   metFORMIN (GLUCOPHAGE-XR) 500 MG 24 hr tablet TAKE 2 TABLETS 2 TIMES A DAY WITH MEALS   Multiple Vitamin (MULTI VITAMIN) TABS 1 tablet Orally Once a day for 30 day(s)   nystatin cream (MYCOSTATIN) Apply 1 Application topically 2 (two) times daily.   olmesartan (BENICAR) 20 MG tablet TAKE 1 TABLET ONCE DAILY   Probiotic Product (PROBIOTIC PO) Take 1 tablet by mouth daily.   simvastatin (ZOCOR) 40 MG tablet TAKE 1 TABLET ONCE DAILY IN THE EVENING   sitaGLIPtin (JANUVIA) 100 MG tablet Take 1 tablet (100 mg total) by mouth daily.   [DISCONTINUED] esomeprazole (NEXIUM) 40 MG capsule 1 capsule daily   esomeprazole (NEXIUM) 40 MG capsule 1 capsule daily   No facility-administered encounter  medications on file as of 10/10/2022.    Allergies  Allergen Reactions   Ace Inhibitors Cough   Trazodone Other (See Comments)   Trazamine [Trazodone & Diet Manage Prod] Other (See Comments)    nightmares    Review of Systems  Constitutional:  Negative for activity change, appetite change, chills, diaphoresis, fatigue, fever and unexpected weight change.  HENT:  Positive for hearing loss.   Eyes: Negative.  Negative for photophobia and visual disturbance.  Respiratory:  Negative for cough, chest tightness and shortness of breath.   Cardiovascular:  Negative for chest pain, palpitations and leg swelling.  Gastrointestinal:  Negative for abdominal pain, blood in stool, constipation, diarrhea, nausea and vomiting.  Endocrine: Negative.  Negative for polydipsia, polyphagia and polyuria.  Genitourinary:  Negative for decreased urine volume, difficulty urinating, dysuria, frequency and urgency.  Musculoskeletal:  Negative for arthralgias and myalgias.  Skin: Negative.   Allergic/Immunologic: Negative.   Neurological:  Positive for numbness (with tingling in both feet). Negative for dizziness, tremors, seizures, syncope, facial asymmetry, speech difficulty, weakness, light-headedness and headaches.  Hematological: Negative.  Negative for adenopathy. Does not bruise/bleed easily.  Psychiatric/Behavioral:  Negative for confusion, hallucinations, sleep disturbance and suicidal ideas.   All other systems reviewed and are negative.       Objective:  BP 139/75   Pulse 61   Temp (!) 97.5 F (36.4 C) (Temporal)   Ht 5\' 9"  (1.753 m)   Wt 171 lb 12.8 oz (77.9 kg)   SpO2 99%   BMI 25.37 kg/m    Wt Readings from Last 3 Encounters:  10/10/22 171 lb 12.8 oz (77.9 kg)  07/11/22 166 lb 3.2 oz (75.4 kg)  04/10/22 169 lb (76.7 kg)    Physical Exam Vitals and nursing note reviewed.  Constitutional:      General: He is not in acute distress.    Appearance: Normal appearance. He is  well-developed, well-groomed and normal weight. He is not ill-appearing, toxic-appearing or diaphoretic.  HENT:     Head: Normocephalic and atraumatic.     Jaw: There is normal jaw occlusion.     Right Ear: Hearing normal.     Left Ear: Hearing normal.     Ears:     Comments: Bilateral hearing aids    Nose: Nose normal.     Mouth/Throat:     Lips: Pink.     Mouth: Mucous membranes are moist.     Pharynx: Oropharynx is clear. Uvula midline.  Eyes:     General: Lids are normal.     Extraocular Movements: Extraocular movements intact.     Conjunctiva/sclera: Conjunctivae normal.     Pupils: Pupils are equal, round, and reactive to light.  Neck:     Thyroid: No thyroid mass, thyromegaly or thyroid tenderness.     Vascular: No carotid bruit or JVD.     Trachea: Trachea and phonation normal.  Cardiovascular:     Rate and Rhythm: Normal rate and regular rhythm.     Chest Wall: PMI is not displaced.     Pulses: Normal pulses.     Heart sounds: Normal heart sounds. No murmur heard.    No friction rub. No gallop.  Pulmonary:     Effort: Pulmonary effort is normal. No respiratory distress.     Breath sounds: Normal breath sounds. No wheezing.  Abdominal:     General: Bowel sounds are normal. There is no distension or abdominal bruit.     Palpations: Abdomen is soft. There is no hepatomegaly or splenomegaly.     Tenderness: There is no abdominal tenderness. There is no right CVA tenderness or left CVA tenderness.     Hernia: No hernia is present.  Musculoskeletal:        General: Normal range of motion.     Cervical back: Normal range of motion and neck supple.     Right lower leg: No edema.     Left lower leg: No edema.  Lymphadenopathy:     Cervical: No cervical adenopathy.  Skin:    General: Skin is warm and dry.     Capillary Refill: Capillary refill takes less than 2 seconds.     Coloration: Skin is not cyanotic, jaundiced or pale.     Findings: No rash.  Neurological:      General: No focal deficit present.     Mental Status: He is alert and oriented to person, place, and time.     Sensory: Sensation is intact.     Motor: Motor function is intact.     Coordination: Coordination is intact.     Gait: Gait is intact.     Deep Tendon Reflexes: Reflexes are normal and symmetric.  Psychiatric:        Attention and Perception: Attention and perception normal.        Mood and Affect: Mood and affect normal.        Speech: Speech normal.        Behavior: Behavior normal. Behavior is cooperative.        Thought Content: Thought content normal.        Cognition and Memory: Cognition and memory normal.        Judgment: Judgment normal.     Results for orders placed or performed in visit on 10/10/22  Bayer DCA Hb A1c Waived  Result Value Ref Range   HB A1C (BAYER DCA - WAIVED) 8.0 (H) 4.8 - 5.6 %       Pertinent labs &  imaging results that were available during my care of the patient were reviewed by me and considered in my medical decision making.  Assessment & Plan:  Alijah was seen today for diabetes.  Diagnoses and all orders for this visit:  Type 2 diabetes mellitus with diabetic neuropathy, with long-term current use of insulin (HCC) A1C 8.0, great job. Keep up the good work. Other labs pending.  -     CBC with Differential/Platelet -     CMP14+EGFR -     Bayer DCA Hb A1c Waived -     Vitamin B12  Hypertension associated with diabetes (HCC) BP well controlled. Changes were not made in regimen today. Goal BP is 130/80. Pt aware to report any persistent high or low readings. DASH diet and exercise encouraged. Exercise at least 150 minutes per week and increase as tolerated. Goal BMI > 25. Stress management encouraged. Avoid nicotine and tobacco product use. Avoid excessive alcohol and NSAID's. Avoid more than 2000 mg of sodium daily. Medications as prescribed. Follow up as scheduled.  -     CBC with Differential/Platelet -      CMP14+EGFR  Hyperlipidemia associated with type 2 diabetes mellitus (HCC) Diet encouraged - increase intake of fresh fruits and vegetables, increase intake of lean proteins. Bake, broil, or grill foods. Avoid fried, greasy, and fatty foods. Avoid fast foods. Increase intake of fiber-rich whole grains. Exercise encouraged - at least 150 minutes per week and advance as tolerated.  Goal BMI < 25. Continue medications as prescribed. Follow up in 3-6 months as discussed.  -     CMP14+EGFR  Numbness and tingling of both feet Will check for deficiencies which may be contributing to symptoms.  -     CMP14+EGFR -     Vitamin B12  Gastroesophageal reflux disease without esophagitis No red flags present. Diet discussed. Avoid fried, spicy, fatty, greasy, and acidic foods. Avoid caffeine, nicotine, and alcohol. Do not eat 2-3 hours before bedtime and stay upright for at least 1-2 hours after eating. Eat small frequent meals. Avoid NSAID's like motrin and aleve. Medications as prescribed. Report any new or worsening symptoms. Follow up as discussed or sooner if needed.   -     CBC with Differential/Platelet -     esomeprazole (NEXIUM) 40 MG capsule; 1 capsule daily     Continue all other maintenance medications.  Follow up plan: Return in 3 months (on 01/10/2023), or if symptoms worsen or fail to improve, for DM schedule AWV.   Continue healthy lifestyle choices, including diet (rich in fruits, vegetables, and lean proteins, and low in salt and simple carbohydrates) and exercise (at least 30 minutes of moderate physical activity daily).  Educational handout given for DM  The above assessment and management plan was discussed with the patient. The patient verbalized understanding of and has agreed to the management plan. Patient is aware to call the clinic if they develop any new symptoms or if symptoms persist or worsen. Patient is aware when to return to the clinic for a follow-up visit. Patient  educated on when it is appropriate to go to the emergency department.   Kari Baars, FNP-C [facility] [phone]

## 2022-10-10 NOTE — Telephone Encounter (Signed)
Patient's wife calling to see if it is okay for patient to take Advil. Please call back

## 2022-10-10 NOTE — Patient Instructions (Addendum)

## 2022-10-11 LAB — CBC WITH DIFFERENTIAL/PLATELET
Basophils Absolute: 0 10*3/uL (ref 0.0–0.2)
Basos: 1 %
EOS (ABSOLUTE): 0.3 10*3/uL (ref 0.0–0.4)
Eos: 8 %
Hematocrit: 33.9 % — ABNORMAL LOW (ref 37.5–51.0)
Hemoglobin: 11.2 g/dL — ABNORMAL LOW (ref 13.0–17.7)
Immature Grans (Abs): 0 10*3/uL (ref 0.0–0.1)
Immature Granulocytes: 0 %
Lymphocytes Absolute: 0.9 10*3/uL (ref 0.7–3.1)
Lymphs: 21 %
MCH: 31 pg (ref 26.6–33.0)
MCHC: 33 g/dL (ref 31.5–35.7)
MCV: 94 fL (ref 79–97)
Monocytes Absolute: 0.4 10*3/uL (ref 0.1–0.9)
Monocytes: 9 %
Neutrophils Absolute: 2.7 10*3/uL (ref 1.4–7.0)
Neutrophils: 61 %
Platelets: 130 10*3/uL — ABNORMAL LOW (ref 150–450)
RBC: 3.61 x10E6/uL — ABNORMAL LOW (ref 4.14–5.80)
RDW: 12.7 % (ref 11.6–15.4)
WBC: 4.3 10*3/uL (ref 3.4–10.8)

## 2022-10-11 LAB — CMP14+EGFR
ALT: 16 IU/L (ref 0–44)
AST: 21 IU/L (ref 0–40)
Albumin/Globulin Ratio: 2 (ref 1.2–2.2)
Albumin: 4 g/dL (ref 3.7–4.7)
Alkaline Phosphatase: 66 IU/L (ref 44–121)
BUN/Creatinine Ratio: 16 (ref 10–24)
BUN: 21 mg/dL (ref 8–27)
Bilirubin Total: 0.5 mg/dL (ref 0.0–1.2)
CO2: 20 mmol/L (ref 20–29)
Calcium: 9.2 mg/dL (ref 8.6–10.2)
Chloride: 101 mmol/L (ref 96–106)
Creatinine, Ser: 1.33 mg/dL — ABNORMAL HIGH (ref 0.76–1.27)
Globulin, Total: 2 g/dL (ref 1.5–4.5)
Glucose: 294 mg/dL — ABNORMAL HIGH (ref 70–99)
Potassium: 4.9 mmol/L (ref 3.5–5.2)
Sodium: 135 mmol/L (ref 134–144)
Total Protein: 6 g/dL (ref 6.0–8.5)
eGFR: 54 mL/min/{1.73_m2} — ABNORMAL LOW (ref >59)

## 2022-10-12 LAB — SPECIMEN STATUS REPORT

## 2022-10-12 LAB — VITAMIN B12: Vitamin B-12: 1241 pg/mL (ref 232–1245)

## 2022-10-16 ENCOUNTER — Other Ambulatory Visit: Payer: Self-pay | Admitting: Family Medicine

## 2022-10-16 DIAGNOSIS — Z794 Long term (current) use of insulin: Secondary | ICD-10-CM

## 2022-10-18 ENCOUNTER — Ambulatory Visit: Payer: Medicare PPO | Admitting: Family Medicine

## 2022-10-18 ENCOUNTER — Encounter: Payer: Self-pay | Admitting: Family Medicine

## 2022-10-18 VITALS — BP 145/67 | HR 58 | Temp 98.7°F | Ht 69.0 in | Wt 171.0 lb

## 2022-10-18 DIAGNOSIS — S00411A Abrasion of right ear, initial encounter: Secondary | ICD-10-CM

## 2022-10-18 NOTE — Progress Notes (Signed)
Subjective: CC: Ear bleeding PCP: Sonny Masters, FNP Joe [name] is a 82 y.o. male presenting to clinic today for:  1.  Ear bleeding Patient is accompanied today's visit by his spouse.  He notes that he had abrupt bleeding of his right ear.  His wife notes that she sometimes cleans his ears but only externally.  He wears hearing aids.  Denies any trauma to the ear.  Not really having any pain but notes some crusty bleeding and they were concerned.   ROS: Per HPI  Allergies  Allergen Reactions   Ace Inhibitors Cough   Trazodone Other (See Comments)   Trazamine [Trazodone & Diet Manage Prod] Other (See Comments)    nightmares   Past Medical History:  Diagnosis Date   Adenomatous colon polyp    Arthritis    Cataract    Chest pain at rest 05/26/2016   Diabetes mellitus without complication (HCC)    Dyslipidemia    ESOPHAGITIS, REFLUX 07/09/2004   Qualifier: Diagnosis of  By: Vear Clock CMA Duncan Dull), Stephanie     Hiatal hernia    HOH (hard of hearing)    has bilateral Hearing aids   Hypertension    Irritable bowel syndrome    Kidney stone 1970   Pneumonia    Tick bites    took 5 rounds of Doxycycline this summer    Current Outpatient Medications:    Accu-Chek Softclix Lancets lancets, CHECK BLOOD SUGAR 4 TIMES A DAY OR AS DIRECTED, Disp: , Rfl:    aspirin 81 MG tablet, Take 81 mg by mouth daily., Disp: , Rfl:    Blood Glucose Monitoring Suppl w/Device KIT, Test BS BID and PRN dx E11.9. Give test strips and lancets to match machine given, Disp: 1 each, Rfl: 1   cholecalciferol (VITAMIN D) 1000 UNITS tablet, Take 2,000 Units by mouth daily., Disp: , Rfl:    Cinnamon 500 MG TABS, Take 1,000 mg by mouth daily., Disp: , Rfl:    Continuous Glucose Sensor (FREESTYLE LIBRE 3 SENSOR) MISC, Apply to arm every 14 days. Use to test blood sugar continuously. DX:E11.65, Disp: 2 each, Rfl: 5   diclofenac Sodium (VOLTAREN) 1 % GEL, Apply 2 g topically 4 (four) times daily., Disp: 100  g, Rfl: 0   doxylamine, Sleep, (UNISOM) 25 MG tablet, Take 25 mg by mouth at bedtime as needed for sleep. , Disp: , Rfl:    esomeprazole (NEXIUM) 40 MG capsule, 1 capsule daily, Disp: 90 capsule, Rfl: 1   glucose blood (ACCU-CHEK GUIDE) test strip, Check blood sugar 4 times daily Dx E 11.40, Disp: 400 each, Rfl: 3   Insulin Aspart, w/Niacinamide, (FIASP IJ), Inject 13 Units as directed in the morning and at bedtime., Disp: , Rfl:    Insulin Pen Needle (PEN NEEDLES) 32G X 5 MM MISC, 1 Device by Does not apply route daily., Disp: 100 each, Rfl: 3   JARDIANCE 25 MG TABS tablet, TAKE ONE TABLET BY MOUTH EVERY MORNING, Disp: 90 tablet, Rfl: 0   MELATONIN PO, Take by mouth., Disp: , Rfl:    metFORMIN (GLUCOPHAGE-XR) 500 MG 24 hr tablet, TAKE 2 TABLETS 2 TIMES A DAY WITH MEALS, Disp: 360 tablet, Rfl: 1   Multiple Vitamin (MULTI VITAMIN) TABS, 1 tablet Orally Once a day for 30 day(s), Disp: , Rfl:    nystatin cream (MYCOSTATIN), Apply 1 Application topically 2 (two) times daily., Disp: 30 g, Rfl: 0   olmesartan (BENICAR) 20 MG tablet, TAKE 1 TABLET ONCE  DAILY, Disp: 90 tablet, Rfl: 0   Probiotic Product (PROBIOTIC PO), Take 1 tablet by mouth daily., Disp: , Rfl:    simvastatin (ZOCOR) 40 MG tablet, TAKE 1 TABLET ONCE DAILY IN THE EVENING, Disp: 90 tablet, Rfl: 1   sitaGLIPtin (JANUVIA) 100 MG tablet, Take 1 tablet (100 mg total) by mouth daily., Disp: 90 tablet, Rfl: 1   TRESIBA FLEXTOUCH 100 UNIT/ML FlexTouch Pen, Inject 6-20 Units into the skin daily at 10 pm., Disp: 15 mL, Rfl: 0 Social History   Socioeconomic History   Marital status: Married    [name]   Number of children: 1   Years of education: 16   Highest education level: Bachelor's degree (e.g., BA, AB, BS)  Occupational History   Occupation: Retired Runner, broadcasting/film/video  Tobacco Use   Smoking status: Former    Packs/day: 2    Types: Cigarettes    Start date: 06/03/1954    Quit date: 08/24/1984    Years since quitting: 38.1    Smokeless tobacco: Never   Tobacco comments:    Has not used to tobacco products for the past 20 years  Vaping Use   Vaping Use: Never used  Substance and Sexual Activity   Alcohol use: Yes    Alcohol/week: 7.0 standard drinks of alcohol    Types: 7 Standard drinks or equivalent per week    Comment: Bourbon at night when he sits on the deck   Drug use: No   Sexual activity: Yes    Partners: Female  Other Topics Concern   Not on file  Social History Narrative   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, both retired teachers   1 son   3 caffeinated beverages daily   1 alcoholic beverage daily   Former smoker no current tobacco no drug use   Social Determinants of Corporate investment banker Strain: Low Risk  (04/04/2021)   Overall Financial Resource Strain (CARDIA)    Difficulty of Paying Living Expenses: Not hard at all  Food Insecurity: No Food Insecurity (04/04/2021)   Hunger Vital Sign    Worried About Running Out of Food in the Last Year: Never true    Ran Out of Food in the Last Year: Never true  Transportation Needs: No Transportation Needs (04/04/2021)   PRAPARE - Administrator, Civil Service (Medical): No    Lack of Transportation (Non-Medical): No  Physical Activity: Insufficiently Active (04/04/2021)   Exercise Vital Sign    Days of Exercise per Week: 3 days    Minutes of Exercise per Session: 20 min  Stress: No Stress Concern Present (04/04/2021)   [employer] of Occupational Health - Occupational Stress Questionnaire    Feeling of Stress : Not at all  Social Connections: Moderately Isolated (04/04/2021)   Social Connection and Isolation Panel [NHANES]    Frequency of Communication with Friends and Family: More than three times a week    Frequency of Social Gatherings with Friends and Family: Once a week    Attends Religious Services: Never    Database administrator or Organizations: No    Attends Banker Meetings: Never    Marital Status:  Married  Catering manager Violence: Not At Risk (04/04/2021)   Humiliation, Afraid, Rape, and Kick questionnaire    Fear of Current or Ex-Partner: No    Emotionally Abused: No    Physically Abused: No    Sexually Abused: No   Family History  Problem Relation Age  of Onset   Breast cancer Mother    Stroke Father 39   Hyperlipidemia Father    Hypertension Father    Heart disease Brother    Cancer Brother        lung    Hyperlipidemia Brother    Hypertension Brother    Stroke Brother    Diabetes Brother    Colon cancer Neg Hx    Colon polyps Neg Hx    Kidney disease Neg Hx    Esophageal cancer Neg Hx    Stomach cancer Neg Hx    Rectal cancer Neg Hx     Objective: Office vital signs reviewed. BP (!) 145/67   Pulse (!) 58   Temp 98.7 F (37.1 C)   Ht 5\' 9"  (1.753 m)   Wt 171 lb (77.6 kg)   SpO2 100%   BMI 25.25 kg/m   Physical Examination:  General: Awake, alert, well nourished, No acute distress HEENT: Right TM intact with normal reflex.  No significant cerumen within external auditory canal.  He has a hemostatic bleed noted at the 6 o'clock position of the external auditory canal.  No purulence or significant soft tissue swelling  Assessment/ Plan: 82 y.o. male   Abrasion of right ear, initial encounter  This is clinically is consistent with a small abrasion.  Appears uncomplicated and was hemostatic on exam.  I advised him to avoid putting anything in his ear for the next few days to allow the scab to heal.  Both he and his wife voiced good understanding will follow-up as needed  No orders of the defined types were placed in this encounter.  No orders of the defined types were placed in this encounter.    Raliegh Ip, DO [facility] [phone]

## 2022-10-30 ENCOUNTER — Telehealth: Payer: Self-pay | Admitting: Family Medicine

## 2022-10-30 DIAGNOSIS — E1140 Type 2 diabetes mellitus with diabetic neuropathy, unspecified: Secondary | ICD-10-CM

## 2022-10-31 ENCOUNTER — Other Ambulatory Visit: Payer: Self-pay | Admitting: Family Medicine

## 2022-10-31 DIAGNOSIS — E1140 Type 2 diabetes mellitus with diabetic neuropathy, unspecified: Secondary | ICD-10-CM

## 2022-10-31 MED ORDER — NOVOLOG FLEXPEN 100 UNIT/ML ~~LOC~~ SOPN
PEN_INJECTOR | SUBCUTANEOUS | 11 refills | Status: DC
Start: 2022-10-31 — End: 2024-01-16

## 2022-10-31 NOTE — Telephone Encounter (Signed)
Fiasp on backorder Samples of novolog provided (same dosing as fiasp) RX called in to [facility] pharmacy per patient request--sent to PCP to cosign Patient and wife made aware of changes in therapy; they verbalize understanding A1c decreased to 8%!

## 2022-11-11 DIAGNOSIS — H6122 Impacted cerumen, left ear: Secondary | ICD-10-CM | POA: Diagnosis not present

## 2022-11-11 DIAGNOSIS — H903 Sensorineural hearing loss, bilateral: Secondary | ICD-10-CM | POA: Insufficient documentation

## 2022-12-03 DIAGNOSIS — B351 Tinea unguium: Secondary | ICD-10-CM | POA: Diagnosis not present

## 2022-12-03 DIAGNOSIS — M79676 Pain in unspecified toe(s): Secondary | ICD-10-CM | POA: Diagnosis not present

## 2022-12-03 DIAGNOSIS — L84 Corns and callosities: Secondary | ICD-10-CM | POA: Diagnosis not present

## 2022-12-03 DIAGNOSIS — E1142 Type 2 diabetes mellitus with diabetic polyneuropathy: Secondary | ICD-10-CM | POA: Diagnosis not present

## 2022-12-12 ENCOUNTER — Ambulatory Visit: Payer: Medicare PPO | Admitting: Family Medicine

## 2022-12-12 ENCOUNTER — Encounter: Payer: Self-pay | Admitting: Family Medicine

## 2022-12-12 VITALS — BP 128/69 | HR 60 | Temp 97.4°F | Ht 69.0 in | Wt 170.4 lb

## 2022-12-12 DIAGNOSIS — B360 Pityriasis versicolor: Secondary | ICD-10-CM | POA: Diagnosis not present

## 2022-12-12 DIAGNOSIS — B369 Superficial mycosis, unspecified: Secondary | ICD-10-CM

## 2022-12-12 MED ORDER — FLUCONAZOLE 150 MG PO TABS
150.0000 mg | ORAL_TABLET | Freq: Once | ORAL | 0 refills | Status: AC
Start: 2022-12-12 — End: 2022-12-12

## 2022-12-12 MED ORDER — KETOCONAZOLE 2 % EX CREA
1.0000 | TOPICAL_CREAM | Freq: Every day | CUTANEOUS | 0 refills | Status: DC
Start: 2022-12-12 — End: 2023-01-14

## 2022-12-12 NOTE — Progress Notes (Signed)
Subjective:  [name], male    DOB: November 10, 1940, 82 y.o.   MRN: [id number]  Patient Care Team: Sonny Masters, FNP as PCP - General (Family Medicine) Bjorn Pippin, MD (Urology) Iva Boop, MD (Gastroenterology) Rollene Rotunda, MD as Consulting Physician (Cardiology) Danella Maiers, [facility] (Pharmacist) Michaelle Copas, MD as Referring Physician (Optometry) Cresenciano Genre Lilla Shook, [facility] as Pharmacist (Family Medicine)   Chief Complaint:  Rash (On gluteal /)   HPI: Joe [name] is a 82 y.o. male presenting on 12/12/2022 for Rash (On gluteal /)   Rash This is a new problem. Episode onset: Friday. The problem is unchanged. The affected locations include the left buttock and right buttock. The rash is characterized by itchiness, redness and scaling. It is unknown if there was an exposure to a precipitant. Pertinent negatives include no anorexia, congestion, cough, diarrhea, eye pain, facial edema, fatigue, fever, joint pain, nail changes, rhinorrhea, shortness of breath, sore throat or vomiting. Past treatments include antihistamine, anti-itch cream, moisturizer and topical steroids. The treatment provided no relief.     Relevant past medical, surgical, family, and social history reviewed and updated as indicated.  Allergies and medications reviewed and updated. Data reviewed: Chart in Epic.   Past Medical History:  Diagnosis Date   Adenomatous colon polyp    Arthritis    Cataract    Chest pain at rest 05/26/2016   Diabetes mellitus without complication (HCC)    Dyslipidemia    ESOPHAGITIS, REFLUX 07/09/2004   Qualifier: Diagnosis of  By: Vear Clock CMA Duncan Dull), Stephanie     Hiatal hernia    HOH (hard of hearing)    has bilateral Hearing aids   Hypertension    Irritable bowel syndrome    Kidney stone 1970   Pneumonia    Tick bites    took 5 rounds of Doxycycline this summer    Past Surgical History:  Procedure Laterality Date   COLONOSCOPY     ESOPHAGEAL  DILATION  1996   ESOPHAGEAL DILATION  2003   FLEXIBLE SIGMOIDOSCOPY     HEMICOLECTOMY  08/2005   Right   KNEE SURGERY Right    TONSILLECTOMY      Social History   Socioeconomic History   Marital status: Married    [name]   Number of children: 1   Years of education: 16   Highest education level: Bachelor's degree (e.g., BA, AB, BS)  Occupational History   Occupation: Retired Runner, broadcasting/film/video  Tobacco Use   Smoking status: Former    Current packs/day: 0.00    Average packs/day: 2.0 packs/day for 30.2 years (60.4 ttl pk-yrs)    Types: Cigarettes    Start date: 06/03/1954    Quit date: 08/24/1984    Years since quitting: 38.3   Smokeless tobacco: Never   Tobacco comments:    Has not used to tobacco products for the past 20 years  Vaping Use   Vaping status: Never Used  Substance and Sexual Activity   Alcohol use: Yes    Alcohol/week: 7.0 standard drinks of alcohol    Types: 7 Standard drinks or equivalent per week    Comment: Bourbon at night when he sits on the deck   Drug use: No   Sexual activity: Yes    Partners: Female  Other Topics Concern   Not on file  Social History Narrative   Lives in [place] with wife, both retired teachers   1 son  3 caffeinated beverages daily   1 alcoholic beverage daily   Former smoker no current tobacco no drug use   Social Determinants of Corporate investment banker Strain: Low Risk  (04/04/2021)   Overall Financial Resource Strain (CARDIA)    Difficulty of Paying Living Expenses: Not hard at all  Food Insecurity: Low Risk  (11/11/2022)   Received from [facility]   Food vital sign    Within the past 12 months, you worried that your food would run out before you got money to buy more: Never true    Within the past 12 months, the food you bought just didn't last and you didn't have money to get more. : Never true  Transportation Needs: Not on file (11/11/2022)  Physical Activity: Insufficiently Active (04/04/2021)    Exercise Vital Sign    Days of Exercise per Week: 3 days    Minutes of Exercise per Session: 20 min  Stress: No Stress Concern Present (04/04/2021)   [employer] of Occupational Health - Occupational Stress Questionnaire    Feeling of Stress : Not at all  Social Connections: Moderately Isolated (04/04/2021)   Social Connection and Isolation Panel [NHANES]    Frequency of Communication with Friends and Family: More than three times a week    Frequency of Social Gatherings with Friends and Family: Once a week    Attends Religious Services: Never    Database administrator or Organizations: No    Attends Banker Meetings: Never    Marital Status: Married  Catering manager Violence: Not At Risk (04/04/2021)   Humiliation, Afraid, Rape, and Kick questionnaire    Fear of Current or Ex-Partner: No    Emotionally Abused: No    Physically Abused: No    Sexually Abused: No    Outpatient Encounter Medications as of 12/12/2022  Medication Sig   Accu-Chek Softclix Lancets lancets CHECK BLOOD SUGAR 4 TIMES A DAY OR AS DIRECTED   aspirin 81 MG tablet Take 81 mg by mouth daily.   Blood Glucose Monitoring Suppl w/Device KIT Test BS BID and PRN dx E11.9. Give test strips and lancets to match machine given   cholecalciferol (VITAMIN D) 1000 UNITS tablet Take 2,000 Units by mouth daily.   Cinnamon 500 MG TABS Take 1,000 mg by mouth daily.   Continuous Glucose Sensor (FREESTYLE LIBRE 2 SENSOR) MISC USE AS DIRECTED UP TO 6 TIMES DAILY, CHANGE EVERY 14 DAYS   diclofenac Sodium (VOLTAREN) 1 % GEL Apply 2 g topically 4 (four) times daily.   doxylamine, Sleep, (UNISOM) 25 MG tablet Take 25 mg by mouth at bedtime as needed for sleep.    esomeprazole (NEXIUM) 40 MG capsule 1 capsule daily   fluconazole (DIFLUCAN) 150 MG tablet Take 1 tablet (150 mg total) by mouth once for 1 dose.   glucose blood (ACCU-CHEK GUIDE) test strip Check blood sugar 4 times daily Dx E 11.40   insulin aspart (NOVOLOG  FLEXPEN) 100 UNIT/ML FlexPen Inject 10-15 units in the morning before breakfast and before dinner as directed   Insulin Pen Needle (PEN NEEDLES) 32G X 5 MM MISC 1 Device by Does not apply route daily.   JARDIANCE 25 MG TABS tablet TAKE ONE TABLET BY MOUTH EVERY MORNING   ketoconazole (NIZORAL) 2 % cream Apply 1 Application topically daily.   MELATONIN PO Take by mouth.   metFORMIN (GLUCOPHAGE-XR) 500 MG 24 hr tablet TAKE 2 TABLETS 2 TIMES A DAY WITH MEALS   Multiple  Vitamin (MULTI VITAMIN) TABS 1 tablet Orally Once a day for 30 day(s)   nystatin cream (MYCOSTATIN) Apply 1 Application topically 2 (two) times daily.   olmesartan (BENICAR) 20 MG tablet TAKE 1 TABLET ONCE DAILY   Probiotic Product (PROBIOTIC PO) Take 1 tablet by mouth daily.   simvastatin (ZOCOR) 40 MG tablet TAKE 1 TABLET ONCE DAILY IN THE EVENING   sitaGLIPtin (JANUVIA) 100 MG tablet Take 1 tablet (100 mg total) by mouth daily.   TRESIBA FLEXTOUCH 100 UNIT/ML FlexTouch Pen Inject 6-20 Units into the skin daily at 10 pm.   No facility-administered encounter medications on file as of 12/12/2022.    Allergies  Allergen Reactions   Ace Inhibitors Cough   Trazodone Other (See Comments)   Trazamine [Trazodone & Diet Manage Prod] Other (See Comments)    nightmares    Review of Systems  Constitutional:  Negative for fatigue and fever.  HENT:  Negative for congestion, rhinorrhea and sore throat.   Eyes:  Negative for pain.  Respiratory:  Negative for cough and shortness of breath.   Gastrointestinal:  Negative for anorexia, diarrhea and vomiting.  Musculoskeletal:  Negative for joint pain.  Skin:  Positive for color change and rash. Negative for nail changes.  All other systems reviewed and are negative.       Objective:  BP 128/69   Pulse 60   Temp (!) 97.4 F (36.3 C) (Temporal)   Ht 5\' 9"  (1.753 m)   Wt 170 lb 6.4 oz (77.3 kg)   SpO2 99%   BMI 25.16 kg/m    Wt Readings from Last 3 Encounters:  12/12/22 170 lb  6.4 oz (77.3 kg)  10/18/22 171 lb (77.6 kg)  10/10/22 171 lb 12.8 oz (77.9 kg)    Physical Exam Vitals and nursing note reviewed.  Constitutional:      Appearance: Normal appearance.  HENT:     Head: Normocephalic and atraumatic.     Ears:     Comments: Bilateral hearing aids    Nose: Nose normal.     Mouth/Throat:     Mouth: Mucous membranes are moist.     Pharynx: Oropharynx is clear. No oropharyngeal exudate or posterior oropharyngeal erythema.  Eyes:     Conjunctiva/sclera: Conjunctivae normal.     Pupils: Pupils are equal, round, and reactive to light.  Cardiovascular:     Rate and Rhythm: Normal rate and regular rhythm.     Heart sounds: Normal heart sounds.  Pulmonary:     Effort: Pulmonary effort is normal.     Breath sounds: Normal breath sounds.  Musculoskeletal:     Right lower leg: No edema.     Left lower leg: No edema.  Skin:    General: Skin is warm and dry.     Capillary Refill: Capillary refill takes less than 2 seconds.          Comments: Erythematous macules, patches, and thin plaques with well demarcated borders.   Neurological:     General: No focal deficit present.     Mental Status: He is alert and oriented to person, place, and time.  Psychiatric:        Mood and Affect: Mood normal.        Behavior: Behavior normal.        Thought Content: Thought content normal.        Judgment: Judgment normal.     Results for orders placed or performed in visit on 10/10/22  CBC with  Differential/Platelet  Result Value Ref Range   WBC 4.3 3.4 - 10.8 x10E3/uL   RBC 3.61 (L) 4.14 - 5.80 x10E6/uL   Hemoglobin 11.2 (L) 13.0 - 17.7 g/dL   Hematocrit 95.2 (L) 84.1 - 51.0 %   MCV 94 79 - 97 fL   MCH 31.0 26.6 - 33.0 pg   MCHC 33.0 31.5 - 35.7 g/dL   RDW 32.4 40.1 - 02.7 %   Platelets 130 (L) 150 - 450 x10E3/uL   Neutrophils 61 Not Estab. %   Lymphs 21 Not Estab. %   Monocytes 9 Not Estab. %   Eos 8 Not Estab. %   Basos 1 Not Estab. %   Neutrophils  Absolute 2.7 1.4 - 7.0 x10E3/uL   Lymphocytes Absolute 0.9 0.7 - 3.1 x10E3/uL   Monocytes Absolute 0.4 0.1 - 0.9 x10E3/uL   EOS (ABSOLUTE) 0.3 0.0 - 0.4 x10E3/uL   Basophils Absolute 0.0 0.0 - 0.2 x10E3/uL   Immature Granulocytes 0 Not Estab. %   Immature Grans (Abs) 0.0 0.0 - 0.1 x10E3/uL  CMP14+EGFR  Result Value Ref Range   Glucose 294 (H) 70 - 99 mg/dL   BUN 21 8 - 27 mg/dL   Creatinine, Ser 2.53 (H) 0.76 - 1.27 mg/dL   eGFR 54 (L) >66 YQ/IHK/7.42   BUN/Creatinine Ratio 16 10 - 24   Sodium 135 134 - 144 mmol/L   Potassium 4.9 3.5 - 5.2 mmol/L   Chloride 101 96 - 106 mmol/L   CO2 20 20 - 29 mmol/L   Calcium 9.2 8.6 - 10.2 mg/dL   Total Protein 6.0 6.0 - 8.5 g/dL   Albumin 4.0 3.7 - 4.7 g/dL   Globulin, Total 2.0 1.5 - 4.5 g/dL   Albumin/Globulin Ratio 2.0 1.2 - 2.2   Bilirubin Total 0.5 0.0 - 1.2 mg/dL   Alkaline Phosphatase 66 44 - 121 IU/L   AST 21 0 - 40 IU/L   ALT 16 0 - 44 IU/L  Bayer DCA Hb A1c Waived  Result Value Ref Range   HB A1C (BAYER DCA - WAIVED) 8.0 (H) 4.8 - 5.6 %  Vitamin B12  Result Value Ref Range   Vitamin B-12 1,241 232 - 1,245 pg/mL  Specimen status report  Result Value Ref Range   specimen status report Comment        Pertinent labs & imaging results that were available during my care of the patient were reviewed by me and considered in my medical decision making.  Assessment & Plan:  Jahmai was seen today for rash.  Diagnoses and all orders for this visit:  Fungal rash of trunk Tinea versicolor Classic tinea versicolor rash to buttocks. Will treat with below. Symptomatic care discussed in detail. Aware to report new, worsening, or persistent symptoms.  -     ketoconazole (NIZORAL) 2 % cream; Apply 1 Application topically daily. -     fluconazole (DIFLUCAN) 150 MG tablet; Take 1 tablet (150 mg total) by mouth once for 1 dose.     Continue all other maintenance medications.  Follow up plan: Return if symptoms worsen or fail to  improve.   Continue healthy lifestyle choices, including diet (rich in fruits, vegetables, and lean proteins, and low in salt and simple carbohydrates) and exercise (at least 30 minutes of moderate physical activity daily).  Educational handout given for tinea versicolor.   The above assessment and management plan was discussed with the patient. The patient verbalized understanding of and has agreed to the management  plan. Patient is aware to call the clinic if they develop any new symptoms or if symptoms persist or worsen. Patient is aware when to return to the clinic for a follow-up visit. Patient educated on when it is appropriate to go to the emergency department.   Kari Baars, FNP-C [facility] [phone]

## 2022-12-13 ENCOUNTER — Other Ambulatory Visit: Payer: Self-pay | Admitting: Family Medicine

## 2022-12-13 DIAGNOSIS — E1140 Type 2 diabetes mellitus with diabetic neuropathy, unspecified: Secondary | ICD-10-CM

## 2022-12-16 ENCOUNTER — Other Ambulatory Visit: Payer: Self-pay | Admitting: Family Medicine

## 2022-12-16 DIAGNOSIS — E1140 Type 2 diabetes mellitus with diabetic neuropathy, unspecified: Secondary | ICD-10-CM

## 2023-01-08 DIAGNOSIS — E119 Type 2 diabetes mellitus without complications: Secondary | ICD-10-CM | POA: Diagnosis not present

## 2023-01-08 DIAGNOSIS — H40033 Anatomical narrow angle, bilateral: Secondary | ICD-10-CM | POA: Diagnosis not present

## 2023-01-14 ENCOUNTER — Encounter: Payer: Self-pay | Admitting: Family Medicine

## 2023-01-14 ENCOUNTER — Ambulatory Visit: Payer: Medicare PPO | Admitting: Family Medicine

## 2023-01-14 VITALS — BP 117/63 | HR 65 | Temp 97.4°F | Resp 20 | Ht 69.0 in | Wt 170.5 lb

## 2023-01-14 DIAGNOSIS — I700 Atherosclerosis of aorta: Secondary | ICD-10-CM

## 2023-01-14 DIAGNOSIS — E1122 Type 2 diabetes mellitus with diabetic chronic kidney disease: Secondary | ICD-10-CM | POA: Diagnosis not present

## 2023-01-14 DIAGNOSIS — I152 Hypertension secondary to endocrine disorders: Secondary | ICD-10-CM

## 2023-01-14 DIAGNOSIS — E1140 Type 2 diabetes mellitus with diabetic neuropathy, unspecified: Secondary | ICD-10-CM

## 2023-01-14 DIAGNOSIS — E785 Hyperlipidemia, unspecified: Secondary | ICD-10-CM

## 2023-01-14 DIAGNOSIS — E1159 Type 2 diabetes mellitus with other circulatory complications: Secondary | ICD-10-CM | POA: Diagnosis not present

## 2023-01-14 DIAGNOSIS — D696 Thrombocytopenia, unspecified: Secondary | ICD-10-CM | POA: Diagnosis not present

## 2023-01-14 DIAGNOSIS — N183 Chronic kidney disease, stage 3 unspecified: Secondary | ICD-10-CM

## 2023-01-14 DIAGNOSIS — Z794 Long term (current) use of insulin: Secondary | ICD-10-CM

## 2023-01-14 DIAGNOSIS — E1169 Type 2 diabetes mellitus with other specified complication: Secondary | ICD-10-CM | POA: Diagnosis not present

## 2023-01-14 LAB — CBC WITH DIFFERENTIAL/PLATELET
Basophils Absolute: 0.1 10*3/uL (ref 0.0–0.2)
Basos: 1 %
EOS (ABSOLUTE): 0.5 10*3/uL — ABNORMAL HIGH (ref 0.0–0.4)
Eos: 10 %
Hematocrit: 33.7 % — ABNORMAL LOW (ref 37.5–51.0)
Hemoglobin: 11.2 g/dL — ABNORMAL LOW (ref 13.0–17.7)
Immature Grans (Abs): 0 10*3/uL (ref 0.0–0.1)
Immature Granulocytes: 0 %
Lymphocytes Absolute: 0.9 10*3/uL (ref 0.7–3.1)
Lymphs: 17 %
MCH: 31 pg (ref 26.6–33.0)
MCHC: 33.2 g/dL (ref 31.5–35.7)
MCV: 93 fL (ref 79–97)
Monocytes Absolute: 0.6 10*3/uL (ref 0.1–0.9)
Monocytes: 11 %
Neutrophils Absolute: 3.3 10*3/uL (ref 1.4–7.0)
Neutrophils: 61 %
Platelets: 145 10*3/uL — ABNORMAL LOW (ref 150–450)
RBC: 3.61 x10E6/uL — ABNORMAL LOW (ref 4.14–5.80)
RDW: 13.2 % (ref 11.6–15.4)
WBC: 5.4 10*3/uL (ref 3.4–10.8)

## 2023-01-14 LAB — CMP14+EGFR
ALT: 16 IU/L (ref 0–44)
AST: 16 IU/L (ref 0–40)
Albumin: 4.2 g/dL (ref 3.7–4.7)
Alkaline Phosphatase: 60 IU/L (ref 44–121)
BUN/Creatinine Ratio: 18 (ref 10–24)
BUN: 25 mg/dL (ref 8–27)
Bilirubin Total: 0.4 mg/dL (ref 0.0–1.2)
CO2: 22 mmol/L (ref 20–29)
Calcium: 9.2 mg/dL (ref 8.6–10.2)
Chloride: 101 mmol/L (ref 96–106)
Creatinine, Ser: 1.4 mg/dL — ABNORMAL HIGH (ref 0.76–1.27)
Globulin, Total: 1.8 g/dL (ref 1.5–4.5)
Glucose: 237 mg/dL — ABNORMAL HIGH (ref 70–99)
Potassium: 5 mmol/L (ref 3.5–5.2)
Sodium: 136 mmol/L (ref 134–144)
Total Protein: 6 g/dL (ref 6.0–8.5)
eGFR: 50 mL/min/{1.73_m2} — ABNORMAL LOW (ref >59)

## 2023-01-14 LAB — LIPID PANEL
Chol/HDL Ratio: 2.5 ratio (ref 0.0–5.0)
Cholesterol, Total: 129 mg/dL (ref 100–199)
HDL: 51 mg/dL (ref >39)
LDL Chol Calc (NIH): 59 mg/dL (ref 0–99)
Triglycerides: 104 mg/dL (ref 0–149)
VLDL Cholesterol Cal: 19 mg/dL (ref 5–40)

## 2023-01-14 LAB — BAYER DCA HB A1C WAIVED: HB A1C (BAYER DCA - WAIVED): 8.9 % — ABNORMAL HIGH (ref 4.8–5.6)

## 2023-01-14 NOTE — Patient Instructions (Addendum)

## 2023-01-14 NOTE — Progress Notes (Signed)
Subjective:  [name], male    DOB: [birth year], 82 y.o.   MRN: [id number]  Patient Care Team: Sonny Masters, FNP as PCP - General (Family Medicine) Bjorn Pippin, MD (Urology) Iva Boop, MD (Gastroenterology) Rollene Rotunda, MD as Consulting Physician (Cardiology) Danella Maiers, [facility] (Pharmacist) Michaelle Copas, MD as Referring Physician (Optometry) Cresenciano Genre Lilla Shook, [facility] as Pharmacist (Family Medicine)   Chief Complaint:  Medical Management of Chronic Issues   HPI: Joe [name] is a 82 y.o. male presenting on 01/14/2023 for Medical Management of Chronic Issues   1. Type 2 diabetes mellitus with diabetic neuropathy, with long-term current use of insulin (HCC) Pt reports his blood sugars have been running high for the last few weeks. Reviewed CGM with noted postprandial highs at lunch time. States he has been drinking ensure at lunch daily with his meal. Does admit to increased sweets over the last several weeks as well. He denies any polyuria, polyphagia, or polydipsia. Taking medications as prescribed and tolerating them well.   2. Hypertension associated with diabetes (HCC) Complaint with meds - Yes Current Medications - olmesartan  Checking BP at home ranging 110-120/60-80 Exercising Regularly - No Watching Salt intake - Yes Pertinent ROS:  Headache - No Fatigue - No Visual Disturbances - No Chest pain - No Dyspnea - No Palpitations - No LE edema - No They report good compliance with medications and can restate their regimen by memory. No medication side effects.  BP Readings from Last 3 Encounters:  01/14/23 117/63  12/12/22 128/69  10/18/22 (!) 145/67     3. Hyperlipidemia associated with type 2 diabetes mellitus (HCC) Compliant with medications - Yes Current medications - simvastatin  Side effects from medications - No Diet - generally healthy  Exercise - active daily  4. Abdominal aortic atherosclerosis (HCC) On ASA and statin  therapy.   5. Thrombocytopenia (HCC) Denies any abnormal bleeding or bruising.   6. CKD stage 3 due to type 2 diabetes mellitus (HCC) No changes in urine output. No leg swelling, weakness, confusion, or fatigue. Is on jardiance and tolerating well.      Relevant past medical, surgical, family, and social history reviewed and updated as indicated.  Allergies and medications reviewed and updated. Data reviewed: Chart in Epic.   Past Medical History:  Diagnosis Date   Adenomatous colon polyp    Arthritis    Cataract    Chest pain at rest 05/26/2016   Diabetes mellitus without complication (HCC)    Dyslipidemia    ESOPHAGITIS, REFLUX 07/09/2004   Qualifier: Diagnosis of  By: Vear Clock CMA Duncan Dull), Stephanie     Hiatal hernia    HOH (hard of hearing)    has bilateral Hearing aids   Hypertension    Irritable bowel syndrome    Kidney stone 1970   Pneumonia    Tick bites    took 5 rounds of Doxycycline this summer    Past Surgical History:  Procedure Laterality Date   COLONOSCOPY     ESOPHAGEAL DILATION  1996   ESOPHAGEAL DILATION  2003   FLEXIBLE SIGMOIDOSCOPY     HEMICOLECTOMY  08/2005   Right   KNEE SURGERY Right    TONSILLECTOMY      Social History   Socioeconomic History   Marital status: Married    [name]   Number of children: 1   Years of education: 16   Highest education level: Bachelor's degree (  e.g., BA, AB, BS)  Occupational History   Occupation: Retired Runner, broadcasting/film/video  Tobacco Use   Smoking status: Former    Current packs/day: 0.00    Average packs/day: 2.0 packs/day for 30.2 years (60.4 ttl pk-yrs)    Types: Cigarettes    Start date: 06/03/1954    Quit date: 08/24/1984    Years since quitting: 38.4   Smokeless tobacco: Never   Tobacco comments:    Has not used to tobacco products for the past 20 years  Vaping Use   Vaping status: Never Used  Substance and Sexual Activity   Alcohol use: Yes    Alcohol/week: 7.0 standard drinks of alcohol     Types: 7 Standard drinks or equivalent per week    Comment: [place]bon at night when he sits on the deck   Drug use: No   Sexual activity: Yes    Partners: Female  Other Topics Concern   Not on file  Social History Narrative   Lives in [place] with wife, both retired teachers   1 son   3 caffeinated beverages daily   1 alcoholic beverage daily   Former smoker no current tobacco no drug use   Social Determinants of Corporate investment banker Strain: Low Risk  (04/04/2021)   Overall Financial Resource Strain (CARDIA)    Difficulty of Paying Living Expenses: Not hard at all  Food Insecurity: Low Risk  (11/11/2022)   Received from [facility]   Food vital sign    Within the past 12 months, you worried that your food would run out before you got money to buy more: Never true    Within the past 12 months, the food you bought just didn't last and you didn't have money to get more. : Never true  Transportation Needs: Not on file (11/11/2022)  Physical Activity: Insufficiently Active (04/04/2021)   Exercise Vital Sign    Days of Exercise per Week: 3 days    Minutes of Exercise per Session: 20 min  Stress: No Stress Concern Present (04/04/2021)   [employer] of Occupational Health - Occupational Stress Questionnaire    Feeling of Stress : Not at all  Social Connections: Moderately Isolated (04/04/2021)   Social Connection and Isolation Panel [NHANES]    Frequency of Communication with Friends and Family: More than three times a week    Frequency of Social Gatherings with Friends and Family: Once a week    Attends Religious Services: Never    Database administrator or Organizations: No    Attends Banker Meetings: Never    Marital Status: Married  Catering manager Violence: Not At Risk (04/04/2021)   Humiliation, Afraid, Rape, and Kick questionnaire    Fear of Current or Ex-Partner: No    Emotionally Abused: No    Physically Abused: No    Sexually Abused: No     Outpatient Encounter Medications as of 01/14/2023  Medication Sig   Accu-Chek Softclix Lancets lancets CHECK BLOOD SUGAR 4 TIMES A DAY OR AS DIRECTED   aspirin 81 MG tablet Take 81 mg by mouth daily.   Blood Glucose Monitoring Suppl w/Device KIT Test BS BID and PRN dx E11.9. Give test strips and lancets to match machine given   cholecalciferol (VITAMIN D) 1000 UNITS tablet Take 2,000 Units by mouth daily.   Cinnamon 500 MG TABS Take 1,000 mg by mouth in the morning and at bedtime.   Continuous Glucose Sensor (FREESTYLE LIBRE 2 SENSOR) MISC USE AS DIRECTED  UP TO 6 TIMES DAILY, CHANGE EVERY 14 DAYS   doxylamine, Sleep, (UNISOM) 25 MG tablet Take 25 mg by mouth at bedtime as needed for sleep.    esomeprazole (NEXIUM) 40 MG capsule 1 capsule daily   glucose blood (ACCU-CHEK GUIDE) test strip Check blood sugar 4 times daily Dx E 11.40   Insulin Aspart, w/Niacinamide, (FIASP) 100 UNIT/ML SOLN Inject as directed.   Insulin Pen Needle (PEN NEEDLES) 32G X 5 MM MISC 1 Device by Does not apply route daily.   JARDIANCE 25 MG TABS tablet TAKE ONE TABLET BY MOUTH EVERY MORNING (Patient taking differently: Take 10 mg by mouth every morning.)   MELATONIN PO Take by mouth.   metFORMIN (GLUCOPHAGE-XR) 500 MG 24 hr tablet TAKE 2 TABLETS 2 TIMES A DAY WITH MEALS (Patient taking differently: 500 mg daily with breakfast. TAKE 2 TABLETS 2 TIMES A DAY WITH MEALS)   Multiple Vitamin (MULTI VITAMIN) TABS 1 tablet Orally Once a day for 30 day(s)   olmesartan (BENICAR) 20 MG tablet TAKE 1 TABLET ONCE DAILY (Patient taking differently: 40 mg.)   Probiotic Product (PROBIOTIC PO) Take 1 tablet by mouth daily.   simvastatin (ZOCOR) 40 MG tablet TAKE 1 TABLET ONCE DAILY IN THE EVENING   sitaGLIPtin (JANUVIA) 100 MG tablet Take 1 tablet (100 mg total) by mouth daily.   TRESIBA FLEXTOUCH 100 UNIT/ML FlexTouch Pen Inject 6-20 Units into the skin daily at 10 pm.   vitamin B-12 (CYANOCOBALAMIN) 100 MCG tablet Take 100 mcg by  mouth daily.   insulin aspart (NOVOLOG FLEXPEN) 100 UNIT/ML FlexPen Inject 10-15 units in the morning before breakfast and before dinner as directed   [DISCONTINUED] diclofenac Sodium (VOLTAREN) 1 % GEL Apply 2 g topically 4 (four) times daily. (Patient not taking: Reported on 01/14/2023)   [DISCONTINUED] ketoconazole (NIZORAL) 2 % cream Apply 1 Application topically daily. (Patient not taking: Reported on 01/14/2023)   [DISCONTINUED] nystatin cream (MYCOSTATIN) Apply 1 Application topically 2 (two) times daily. (Patient not taking: Reported on 01/14/2023)   No facility-administered encounter medications on file as of 01/14/2023.    Allergies  Allergen Reactions   Ace Inhibitors Cough   Trazodone Other (See Comments)   Trazamine [Trazodone & Diet Manage Prod] Other (See Comments)    nightmares    Review of Systems  Constitutional:  Negative for activity change, appetite change, chills, diaphoresis, fatigue, fever and unexpected weight change.  HENT:  Positive for hearing loss.   Eyes: Negative.  Negative for photophobia and visual disturbance.  Respiratory:  Negative for cough, chest tightness and shortness of breath.   Cardiovascular:  Negative for chest pain, palpitations and leg swelling.  Gastrointestinal:  Negative for blood in stool, constipation, diarrhea, nausea and vomiting.  Endocrine: Negative.  Negative for cold intolerance, heat intolerance, polydipsia, polyphagia and polyuria.  Genitourinary:  Negative for decreased urine volume, difficulty urinating, dysuria, frequency and urgency.  Musculoskeletal:  Negative for arthralgias and myalgias.  Skin: Negative.   Allergic/Immunologic: Negative.   Neurological:  Positive for numbness (feet - not new or woresning). Negative for dizziness and headaches.  Hematological: Negative.   Psychiatric/Behavioral:  Negative for confusion, hallucinations, sleep disturbance and suicidal ideas.   All other systems reviewed and are  negative.       Objective:  BP 117/63   Pulse 65   Temp (!) 97.4 F (36.3 C) (Oral)   Resp 20   Ht 5\' 9"  (1.753 m)   Wt 170 lb 8 oz (77.3 kg)  SpO2 99%   BMI 25.18 kg/m    Wt Readings from Last 3 Encounters:  01/14/23 170 lb 8 oz (77.3 kg)  12/12/22 170 lb 6.4 oz (77.3 kg)  10/18/22 171 lb (77.6 kg)    Physical Exam Vitals and nursing note reviewed.  Constitutional:      General: He is not in acute distress.    Appearance: Normal appearance. He is well-developed and well-groomed. He is not ill-appearing, toxic-appearing or diaphoretic.  HENT:     Head: Normocephalic and atraumatic.     Jaw: There is normal jaw occlusion.     Right Ear: Decreased hearing noted.     Left Ear: Decreased hearing noted.     Ears:     Comments: Bilateral hearing aids    Nose: Nose normal.     Mouth/Throat:     Lips: Pink.     Mouth: Mucous membranes are moist.     Pharynx: Oropharynx is clear. Uvula midline.  Eyes:     General: Lids are normal.     Extraocular Movements: Extraocular movements intact.     Conjunctiva/sclera: Conjunctivae normal.     Pupils: Pupils are equal, round, and reactive to light.  Neck:     Trachea: Trachea and phonation normal.  Cardiovascular:     Rate and Rhythm: Normal rate and regular rhythm.     Chest Wall: PMI is not displaced.     Pulses: Normal pulses.     Heart sounds: Normal heart sounds. No murmur heard.    No friction rub. No gallop.  Pulmonary:     Effort: Pulmonary effort is normal. No respiratory distress.     Breath sounds: Normal breath sounds. No wheezing.  Abdominal:     General: Bowel sounds are normal.     Palpations: Abdomen is soft.  Musculoskeletal:        General: Normal range of motion.     Cervical back: Normal range of motion and neck supple.     Right lower leg: No edema.     Left lower leg: No edema.  Skin:    General: Skin is warm and dry.     Capillary Refill: Capillary refill takes less than 2 seconds.      Coloration: Skin is not cyanotic, jaundiced or pale.     Findings: No rash.  Neurological:     General: No focal deficit present.     Mental Status: He is alert and oriented to person, place, and time.     Sensory: Sensation is intact.     Motor: Motor function is intact.     Coordination: Coordination is intact.     Gait: Gait is intact.     Deep Tendon Reflexes: Reflexes are normal and symmetric.  Psychiatric:        Attention and Perception: Attention and perception normal.        Mood and Affect: Mood and affect normal.        Speech: Speech normal.        Behavior: Behavior normal. Behavior is cooperative.        Thought Content: Thought content normal.        Cognition and Memory: Cognition and memory normal.        Judgment: Judgment normal.     Results for orders placed or performed in visit on 10/10/22  CBC with Differential/Platelet  Result Value Ref Range   WBC 4.3 3.4 - 10.8 x10E3/uL   RBC 3.61 (L) 4.14 -  5.80 x10E6/uL   Hemoglobin 11.2 (L) 13.0 - 17.7 g/dL   Hematocrit 16.1 (L) 09.6 - 51.0 %   MCV 94 79 - 97 fL   MCH 31.0 26.6 - 33.0 pg   MCHC 33.0 31.5 - 35.7 g/dL   RDW 04.5 40.9 - 81.1 %   Platelets 130 (L) 150 - 450 x10E3/uL   Neutrophils 61 Not Estab. %   Lymphs 21 Not Estab. %   Monocytes 9 Not Estab. %   Eos 8 Not Estab. %   Basos 1 Not Estab. %   Neutrophils Absolute 2.7 1.4 - 7.0 x10E3/uL   Lymphocytes Absolute 0.9 0.7 - 3.1 x10E3/uL   Monocytes Absolute 0.4 0.1 - 0.9 x10E3/uL   EOS (ABSOLUTE) 0.3 0.0 - 0.4 x10E3/uL   Basophils Absolute 0.0 0.0 - 0.2 x10E3/uL   Immature Granulocytes 0 Not Estab. %   Immature Grans (Abs) 0.0 0.0 - 0.1 x10E3/uL  CMP14+EGFR  Result Value Ref Range   Glucose 294 (H) 70 - 99 mg/dL   BUN 21 8 - 27 mg/dL   Creatinine, Ser 9.14 (H) 0.76 - 1.27 mg/dL   eGFR 54 (L) >78 GN/FAO/1.30   BUN/Creatinine Ratio 16 10 - 24   Sodium 135 134 - 144 mmol/L   Potassium 4.9 3.5 - 5.2 mmol/L   Chloride 101 96 - 106 mmol/L   CO2 20 20  - 29 mmol/L   Calcium 9.2 8.6 - 10.2 mg/dL   Total Protein 6.0 6.0 - 8.5 g/dL   Albumin 4.0 3.7 - 4.7 g/dL   Globulin, Total 2.0 1.5 - 4.5 g/dL   Albumin/Globulin Ratio 2.0 1.2 - 2.2   Bilirubin Total 0.5 0.0 - 1.2 mg/dL   Alkaline Phosphatase 66 44 - 121 IU/L   AST 21 0 - 40 IU/L   ALT 16 0 - 44 IU/L  Bayer DCA Hb A1c Waived  Result Value Ref Range   HB A1C (BAYER DCA - WAIVED) 8.0 (H) 4.8 - 5.6 %  Vitamin B12  Result Value Ref Range   Vitamin B-12 1,241 232 - 1,245 pg/mL  Specimen status report  Result Value Ref Range   specimen status report Comment        Pertinent labs & imaging results that were available during my care of the patient were reviewed by me and considered in my medical decision making.  Assessment & Plan:  Javae was seen today for medical management of chronic issues.  Diagnoses and all orders for this visit:  Type 2 diabetes mellitus with diabetic neuropathy, with long-term current use of insulin (HCC) A1C 8.9. discussed dietary changes in detail. Other labs pending. Will repeat in 3 months and adjust regimen if warranted.  -     Microalbumin / creatinine urine ratio -     Bayer DCA Hb A1c Waived -     CBC with Differential/Platelet -     CMP14+EGFR -     Lipid panel  Hypertension associated with diabetes (HCC) BP well controlled. Changes were not made in regimen today. Goal BP is 130/80. Pt aware to report any persistent high or low readings. DASH diet and exercise encouraged. Exercise at least 150 minutes per week and increase as tolerated. Goal BMI > 25. Stress management encouraged. Avoid nicotine and tobacco product use. Avoid excessive alcohol and NSAID's. Avoid more than 2000 mg of sodium daily. Medications as prescribed. Follow up as scheduled.  -     Microalbumin / creatinine urine ratio -  Bayer DCA Hb A1c Waived -     CBC with Differential/Platelet -     CMP14+EGFR -     Lipid panel  Hyperlipidemia associated with type 2 diabetes  mellitus (HCC) Diet encouraged - increase intake of fresh fruits and vegetables, increase intake of lean proteins. Bake, broil, or grill foods. Avoid fried, greasy, and fatty foods. Avoid fast foods. Increase intake of fiber-rich whole grains. Exercise encouraged - at least 150 minutes per week and advance as tolerated.  Goal BMI < 25. Continue medications as prescribed. Follow up in 3-6 months as discussed.  -     Microalbumin / creatinine urine ratio -     Bayer DCA Hb A1c Waived -     CBC with Differential/Platelet -     CMP14+EGFR -     Lipid panel  Abdominal aortic atherosclerosis (HCC) Continue ASA and statin therapy.  Thrombocytopenia (HCC) Will repeat labs today, referral if warranted   CKD stage 3 due to type 2 diabetes mellitus (HCC) Will repeat labs today, on Jardiance    Continue all other maintenance medications.  Follow up plan: Return in about 3 months (around 04/16/2023) for DM.   Continue healthy lifestyle choices, including diet (rich in fruits, vegetables, and lean proteins, and low in salt and simple carbohydrates) and exercise (at least 30 minutes of moderate physical activity daily).  Educational handout given for DM  The above assessment and management plan was discussed with the patient. The patient verbalized understanding of and has agreed to the management plan. Patient is aware to call the clinic if they develop any new symptoms or if symptoms persist or worsen. Patient is aware when to return to the clinic for a follow-up visit. Patient educated on when it is appropriate to go to the emergency department.   Kari Baars, FNP-C [facility] [phone]

## 2023-01-16 ENCOUNTER — Telehealth: Payer: Self-pay | Admitting: Family Medicine

## 2023-01-16 DIAGNOSIS — L299 Pruritus, unspecified: Secondary | ICD-10-CM | POA: Diagnosis not present

## 2023-01-16 NOTE — Telephone Encounter (Signed)
Patient needs a follow up appt with Raynelle Fanning per wife

## 2023-01-17 ENCOUNTER — Ambulatory Visit: Payer: Medicare PPO | Admitting: Family Medicine

## 2023-01-23 ENCOUNTER — Ambulatory Visit: Payer: Medicare PPO | Admitting: Pharmacist

## 2023-01-23 ENCOUNTER — Ambulatory Visit: Payer: Medicare PPO

## 2023-01-23 DIAGNOSIS — E119 Type 2 diabetes mellitus without complications: Secondary | ICD-10-CM

## 2023-01-23 DIAGNOSIS — Z794 Long term (current) use of insulin: Secondary | ICD-10-CM

## 2023-01-23 NOTE — Progress Notes (Signed)
01/23/2023 Name: Joe [name] MRN: [id number] DOB: [birth year]  Chief Complaint  Patient presents with   Diabetes    Joe [name] is a 82 y.o. year old male who was referred for medication management by their primary care provider, Rakes, Doralee Albino, FNP. They presented for a face to face visit today.   They were referred to the pharmacist by their PCP for assistance in managing diabetes  82 y.o. male who presents for diabetes evaluation, education, and management.  PMH is significant for T2DM, HTN, GERD, CKD, and HLD.  At last visit, A1c was 8.3-->now 8.9%.    Today, patient arrives in good spirits and presents with his wife.    Current diabetes medications include: Jardiance 25 mg daily, Tresiba 22 units QPM, Fiasp 10-12 units prior to breakfast and prior to lunch, metformin 1000 mg BID, Januvia 100 mg daily   Subjective:  Care Team: Primary Care Provider: Sonny Masters, FNP ;   Medication Access/Adherence  Current Pharmacy:  [facility] [place], [place] - [address] [place] [address] Phone: [phone] Fax: [phone]  [facility] [address], [place] - [address] [place] [address] [place] [address] Phone: [phone] Fax: [phone]   Patient reports affordability concerns with their medications: No  Patient reports access/transportation concerns to their pharmacy: No  Patient reports adherence concerns with their medications:  No     Diabetes:  Current medications:  Medications tried in the past:   Current glucose readings: higher PPBG after breakfast and lunch--need more meal time coverage Using libre 2 meter (using phone as reader/scanner); testing continuously    -Patient is injecting insulin 3-4 or more times daily -He greatly benefits from his [employer] continuous glucose monitoring system    Current meal patterns:  Discussed meal planning options and Plate method for healthy eating Avoid  sugary drinks and desserts Incorporate balanced protein, non starchy veggies, 1 serving of carbohydrate with each meal Increase water intake Increase physical activity as able  Current physical activity: encouraged as able  Current medication access support: medicare/n/a   Objective:  Lab Results  Component Value Date   HGBA1C 8.9 (H) 01/14/2023    Lab Results  Component Value Date   CREATININE 1.40 (H) 01/14/2023   BUN 25 01/14/2023   NA 136 01/14/2023   K 5.0 01/14/2023   CL 101 01/14/2023   CO2 22 01/14/2023    Lab Results  Component Value Date   CHOL 129 01/14/2023   HDL 51 01/14/2023   LDLCALC 59 01/14/2023   TRIG 104 01/14/2023   CHOLHDL 2.5 01/14/2023    Medications Reviewed Today   Medications were not reviewed in this encounter       Assessment/Plan:   A/P: Diabetes longstanding currently above goal based on A1c (8.3%). Patient is able to verbalize appropriate hypoglycemia management plan. Medication adherence appears appropriate. Post prandial excursion seen on CGM data. No hypoglycemic events noted.  -Adjust placement of sensor (move to back on arm in fatty/subcutaneous--current sensor is more in the muscle) -Continue basal insulin Tresiba -Adjusted dose of rapid insulin Fiasp to 10-15 units prior to breakfast and 10-15 units prior to lunch given breakfast is his largest meal of the day. May need to titrate to 10 units BID in the future.  -high protein snack at bedtime to avoid hypoglycemia -Continued SGLT2-I Jardiance 25 mg daily. Counseled on sick day rules. -Continued metformin 1000 mg  BID.  -Continued Januvia 100 mg daily.  -Patient educated on purpose, proper use, and potential adverse effects of insulin.  -Extensively discussed pathophysiology of diabetes, recommended lifestyle interventions, dietary effects on blood sugar control.  -Counseled on s/sx of and management of hypoglycemia.  -Next A1c anticipated next week  Follow Up Plan: 4  weeks.  Kieth Brightly, PharmD, BCACP Clinical Pharmacist, [facility]

## 2023-02-10 ENCOUNTER — Other Ambulatory Visit: Payer: Self-pay | Admitting: Family Medicine

## 2023-02-10 DIAGNOSIS — E1169 Type 2 diabetes mellitus with other specified complication: Secondary | ICD-10-CM

## 2023-02-10 DIAGNOSIS — E1140 Type 2 diabetes mellitus with diabetic neuropathy, unspecified: Secondary | ICD-10-CM

## 2023-02-11 DIAGNOSIS — L84 Corns and callosities: Secondary | ICD-10-CM | POA: Diagnosis not present

## 2023-02-11 DIAGNOSIS — M79676 Pain in unspecified toe(s): Secondary | ICD-10-CM | POA: Diagnosis not present

## 2023-02-11 DIAGNOSIS — B351 Tinea unguium: Secondary | ICD-10-CM | POA: Diagnosis not present

## 2023-02-11 DIAGNOSIS — E1142 Type 2 diabetes mellitus with diabetic polyneuropathy: Secondary | ICD-10-CM | POA: Diagnosis not present

## 2023-02-14 ENCOUNTER — Telehealth: Payer: Self-pay | Admitting: Family Medicine

## 2023-02-14 NOTE — Telephone Encounter (Signed)
Needs Rx for diabetic shoes and inserts sent to [facility]. Must have diag code and must be signed by MD; not PA.

## 2023-02-18 ENCOUNTER — Other Ambulatory Visit: Payer: Self-pay | Admitting: Family Medicine

## 2023-02-18 DIAGNOSIS — E1140 Type 2 diabetes mellitus with diabetic neuropathy, unspecified: Secondary | ICD-10-CM

## 2023-02-18 NOTE — Telephone Encounter (Signed)
Order printed - please fax to pharmacy

## 2023-02-18 NOTE — Telephone Encounter (Signed)
Has been faxed per Marcelino Duster- was given to one of the girls up front to fax for her.

## 2023-02-26 DIAGNOSIS — E1140 Type 2 diabetes mellitus with diabetic neuropathy, unspecified: Secondary | ICD-10-CM | POA: Diagnosis not present

## 2023-02-28 ENCOUNTER — Other Ambulatory Visit: Payer: Self-pay | Admitting: Family Medicine

## 2023-02-28 DIAGNOSIS — E1140 Type 2 diabetes mellitus with diabetic neuropathy, unspecified: Secondary | ICD-10-CM

## 2023-03-10 ENCOUNTER — Other Ambulatory Visit: Payer: Self-pay | Admitting: Family Medicine

## 2023-03-10 DIAGNOSIS — Z794 Long term (current) use of insulin: Secondary | ICD-10-CM

## 2023-03-17 ENCOUNTER — Other Ambulatory Visit: Payer: Self-pay | Admitting: Family Medicine

## 2023-03-17 DIAGNOSIS — E1140 Type 2 diabetes mellitus with diabetic neuropathy, unspecified: Secondary | ICD-10-CM

## 2023-04-16 ENCOUNTER — Ambulatory Visit: Payer: Medicare PPO | Admitting: Family Medicine

## 2023-04-29 ENCOUNTER — Other Ambulatory Visit: Payer: Self-pay | Admitting: Family Medicine

## 2023-04-29 DIAGNOSIS — E1142 Type 2 diabetes mellitus with diabetic polyneuropathy: Secondary | ICD-10-CM | POA: Diagnosis not present

## 2023-04-29 DIAGNOSIS — M79676 Pain in unspecified toe(s): Secondary | ICD-10-CM | POA: Diagnosis not present

## 2023-04-29 DIAGNOSIS — E1140 Type 2 diabetes mellitus with diabetic neuropathy, unspecified: Secondary | ICD-10-CM

## 2023-04-29 DIAGNOSIS — L84 Corns and callosities: Secondary | ICD-10-CM | POA: Diagnosis not present

## 2023-04-29 DIAGNOSIS — B351 Tinea unguium: Secondary | ICD-10-CM | POA: Diagnosis not present

## 2023-04-30 ENCOUNTER — Encounter: Payer: Self-pay | Admitting: Family Medicine

## 2023-04-30 ENCOUNTER — Ambulatory Visit: Payer: Medicare PPO | Admitting: Family Medicine

## 2023-04-30 VITALS — BP 127/64 | HR 63 | Temp 97.2°F | Resp 20 | Ht 69.0 in | Wt 171.5 lb

## 2023-04-30 DIAGNOSIS — D696 Thrombocytopenia, unspecified: Secondary | ICD-10-CM

## 2023-04-30 DIAGNOSIS — R2681 Unsteadiness on feet: Secondary | ICD-10-CM | POA: Diagnosis not present

## 2023-04-30 DIAGNOSIS — E1122 Type 2 diabetes mellitus with diabetic chronic kidney disease: Secondary | ICD-10-CM

## 2023-04-30 DIAGNOSIS — E1140 Type 2 diabetes mellitus with diabetic neuropathy, unspecified: Secondary | ICD-10-CM

## 2023-04-30 DIAGNOSIS — E785 Hyperlipidemia, unspecified: Secondary | ICD-10-CM

## 2023-04-30 DIAGNOSIS — I152 Hypertension secondary to endocrine disorders: Secondary | ICD-10-CM

## 2023-04-30 DIAGNOSIS — N183 Chronic kidney disease, stage 3 unspecified: Secondary | ICD-10-CM

## 2023-04-30 DIAGNOSIS — Z23 Encounter for immunization: Secondary | ICD-10-CM | POA: Diagnosis not present

## 2023-04-30 DIAGNOSIS — E1159 Type 2 diabetes mellitus with other circulatory complications: Secondary | ICD-10-CM

## 2023-04-30 DIAGNOSIS — Z794 Long term (current) use of insulin: Secondary | ICD-10-CM

## 2023-04-30 DIAGNOSIS — E1169 Type 2 diabetes mellitus with other specified complication: Secondary | ICD-10-CM | POA: Diagnosis not present

## 2023-04-30 DIAGNOSIS — I700 Atherosclerosis of aorta: Secondary | ICD-10-CM | POA: Diagnosis not present

## 2023-04-30 LAB — BAYER DCA HB A1C WAIVED: HB A1C (BAYER DCA - WAIVED): 8.3 % — ABNORMAL HIGH (ref 4.8–5.6)

## 2023-04-30 NOTE — Patient Instructions (Signed)

## 2023-04-30 NOTE — Progress Notes (Signed)
Subjective:  Patient ID: DREWEY HOLEN, male    DOB: [birth year], 82 y.o.   MRN: [id number]  Patient Care Team: Sonny Masters, FNP as PCP - General (Family Medicine) Bjorn Pippin, MD (Urology) Iva Boop, MD (Gastroenterology) Rollene Rotunda, MD as Consulting Physician (Cardiology) Danella Maiers, [facility] (Pharmacist) Michaelle Copas, MD as Referring Physician (Optometry) Cresenciano Genre Lilla Shook, [facility] as Pharmacist (Family Medicine)   Chief Complaint:  Medical Management of Chronic Issues   HPI: Joe [name] is a 82 y.o. male presenting on 04/30/2023 for Medical Management of Chronic Issues   Discussed the use of AI scribe software for clinical note transcription with the patient, who gave verbal consent to proceed.  History of Present Illness   The patient, with a history of diabetes, presents with concerns about blood sugar management. He reports experiencing low blood sugar levels at night, which he describes as "scary." To manage these episodes, he consumes grape juice and waits 20 minutes before checking his blood sugar levels again. He also reports that his blood sugar levels have been running high on certain days, which he attributes to overeating.  The patient also reports a recent dental issue, where a tooth extraction took an unusually long time due to the root having fused to the bone. He expresses concern about another tooth with a cavity and is considering having it extracted as well.  In addition to his diabetes and dental issues, the patient has been experiencing balance problems. He denies any dizziness but reports feeling off balance, particularly when bending over or descending steps. He expresses frustration with his balance issues and admits to not consistently performing the exercises recommended by his physical therapist during a previous round of physical therapy.  The patient also reports neuropathy in his feet, describing a tingling sensation that has been  worsening, particularly at night. He denies any sores or other problems with his feet. He also mentions a recent skin issue on one leg, which initially appeared red and pus-filled. The lesion has since scabbed over and is healing.  The patient's blood sugar levels have been averaging around 184 over the past 30 days, with an A1C of 8.3. He is currently managing his diabetes with Lynnell Chad, Metformin, and Januvia.    His CKD has been well controlled and he denies any changes in urinary output.  Blood pressures have been greatly controlled and he denies any cheat pain, leg swelling, weakness, confusion, or syncope. No abnormal bleeding or bruising reported.  Does try to follow a healthy diet. He is complaint with cholesterol medications.    Relevant past medical, surgical, family, and social history reviewed and updated as indicated.  Allergies and medications reviewed and updated. Data reviewed: Chart in Epic.   Past Medical History:  Diagnosis Date   Adenomatous colon polyp    Arthritis    Cataract    Chest pain at rest 05/26/2016   Diabetes mellitus without complication (HCC)    Dyslipidemia    ESOPHAGITIS, REFLUX 07/09/2004   Qualifier: Diagnosis of  By: Vear Clock CMA Duncan Dull), Stephanie     Hiatal hernia    HOH (hard of hearing)    has bilateral Hearing aids   Hypertension    Irritable bowel syndrome    Kidney stone 1970   Pneumonia    Tick bites    took 5 rounds of Doxycycline this summer    Past Surgical History:  Procedure Laterality Date   COLONOSCOPY  ESOPHAGEAL DILATION  1996   ESOPHAGEAL DILATION  2003   FLEXIBLE SIGMOIDOSCOPY     HEMICOLECTOMY  08/2005   Right   KNEE SURGERY Right    TONSILLECTOMY      Social History   Socioeconomic History   Marital status: Married    [name]   Number of children: 1   Years of education: 16   Highest education level: Bachelor's degree (e.g., BA, AB, BS)  Occupational History   Occupation: Retired  Runner, broadcasting/film/video  Tobacco Use   Smoking status: Former    Current packs/day: 0.00    Average packs/day: 2.0 packs/day for 30.2 years (60.4 ttl pk-yrs)    Types: Cigarettes    Start date: 06/03/1954    Quit date: 08/24/1984    Years since quitting: 38.7   Smokeless tobacco: Never   Tobacco comments:    Has not used to tobacco products for the past 20 years  Vaping Use   Vaping status: Never Used  Substance and Sexual Activity   Alcohol use: Yes    Alcohol/week: 7.0 standard drinks of alcohol    Types: 7 Standard drinks or equivalent per week    Comment: [place]bon at night when he sits on the deck   Drug use: No   Sexual activity: Yes    Partners: Female  Other Topics Concern   Not on file  Social History Narrative   Lives in [place] with wife, both retired teachers   1 son   3 caffeinated beverages daily   1 alcoholic beverage daily   Former smoker no current tobacco no drug use   Social Determinants of Health   Financial Resource Strain: Low Risk  (04/04/2021)   Overall Financial Resource Strain (CARDIA)    Difficulty of Paying Living Expenses: Not hard at all  Food Insecurity: Low Risk  (11/11/2022)   Received from [facility]   Hunger Vital Sign    Worried About Running Out of Food in the Last Year: Never true    Ran Out of Food in the Last Year: Never true  Transportation Needs: Not on file (11/11/2022)  Physical Activity: Insufficiently Active (04/04/2021)   Exercise Vital Sign    Days of Exercise per Week: 3 days    Minutes of Exercise per Session: 20 min  Stress: No Stress Concern Present (04/04/2021)   [employer] of Occupational Health - Occupational Stress Questionnaire    Feeling of Stress : Not at all  Social Connections: Moderately Isolated (04/04/2021)   Social Connection and Isolation Panel [NHANES]    Frequency of Communication with Friends and Family: More than three times a week    Frequency of Social Gatherings with Friends and Family: Once a week     Attends Religious Services: Never    Database administrator or Organizations: No    Attends Banker Meetings: Never    Marital Status: Married  Catering manager Violence: Not At Risk (04/04/2021)   Humiliation, Afraid, Rape, and Kick questionnaire    Fear of Current or Ex-Partner: No    Emotionally Abused: No    Physically Abused: No    Sexually Abused: No    Outpatient Encounter Medications as of 04/30/2023  Medication Sig   Accu-Chek Softclix Lancets lancets CHECK BLOOD SUGAR 4 TIMES A DAY OR AS DIRECTED   aspirin 81 MG tablet Take 81 mg by mouth daily.   Blood Glucose Monitoring Suppl w/Device KIT Test BS BID and PRN dx E11.9. Give test  strips and lancets to match machine given   cholecalciferol (VITAMIN D) 1000 UNITS tablet Take 2,000 Units by mouth daily.   Cinnamon 500 MG TABS Take 1,000 mg by mouth in the morning and at bedtime.   Continuous Glucose Sensor (FREESTYLE LIBRE 2 SENSOR) MISC USE AS DIRECTED UP TO 6 TIMES DAILY, CHANGE EVERY 14 DAYS   doxylamine, Sleep, (UNISOM) 25 MG tablet Take 25 mg by mouth at bedtime as needed for sleep.    empagliflozin (JARDIANCE) 25 MG TABS tablet TAKE ONE TABLET BY MOUTH EVERY MORNING   esomeprazole (NEXIUM) 40 MG capsule 1 capsule daily   glucose blood (ACCU-CHEK GUIDE) test strip Check blood sugar 4 times daily Dx E 11.40   insulin aspart (NOVOLOG FLEXPEN) 100 UNIT/ML FlexPen Inject 10-15 units in the morning before breakfast and before dinner as directed   Insulin Pen Needle (PEN NEEDLES) 32G X 5 MM MISC 1 Device by Does not apply route daily.   MELATONIN PO Take by mouth.   metFORMIN (GLUCOPHAGE-XR) 500 MG 24 hr tablet TAKE TWO TABLETS TWICE DAILY WITH MEAL(S)   Multiple Vitamin (MULTI VITAMIN) TABS 1 tablet Orally Once a day for 30 day(s)   olmesartan (BENICAR) 20 MG tablet TAKE 1 TABLET ONCE DAILY   Probiotic Product (PROBIOTIC PO) Take 1 tablet by mouth daily.   simvastatin (ZOCOR) 40 MG tablet TAKE ONE TABLET DAILY EVERY  EVENING   sitaGLIPtin (JANUVIA) 100 MG tablet TAKE ONE TABLET ONCE DAILY   TRESIBA FLEXTOUCH 100 UNIT/ML FlexTouch Pen Inject 6-20 Units into the skin daily at 10 pm.   vitamin B-12 (CYANOCOBALAMIN) 100 MCG tablet Take 100 mcg by mouth daily.   No facility-administered encounter medications on file as of 04/30/2023.    Allergies  Allergen Reactions   Ace Inhibitors Cough   Trazodone Other (See Comments)   Trazamine [Trazodone & Diet Manage Prod] Other (See Comments)    nightmares    Pertinent ROS per HPI, otherwise unremarkable      Objective:  BP 127/64   Pulse 63   Temp (!) 97.2 F (36.2 C) (Oral)   Resp 20   Ht 5\' 9"  (1.753 m)   Wt 171 lb 8 oz (77.8 kg)   SpO2 99%   BMI 25.33 kg/m    Wt Readings from Last 3 Encounters:  04/30/23 171 lb 8 oz (77.8 kg)  01/14/23 170 lb 8 oz (77.3 kg)  12/12/22 170 lb 6.4 oz (77.3 kg)    Physical Exam Vitals and nursing note reviewed.  Constitutional:      General: He is not in acute distress.    Appearance: Normal appearance. He is normal weight. He is not ill-appearing, toxic-appearing or diaphoretic.  HENT:     Head: Normocephalic and atraumatic.     Right Ear: Decreased hearing noted.     Left Ear: Decreased hearing noted.     Ears:     Comments: Bilateral hearing aids    Mouth/Throat:     Mouth: Mucous membranes are moist.     Pharynx: Oropharynx is clear.  Eyes:     Conjunctiva/sclera: Conjunctivae normal.     Pupils: Pupils are equal, round, and reactive to light.  Cardiovascular:     Rate and Rhythm: Normal rate and regular rhythm.     Heart sounds: Normal heart sounds.  Pulmonary:     Effort: Pulmonary effort is normal.     Breath sounds: Normal breath sounds.  Abdominal:     General: Bowel sounds are  normal.     Palpations: Abdomen is soft.  Musculoskeletal:     Cervical back: Neck supple.     Right lower leg: No edema.     Left lower leg: No edema.  Skin:    General: Skin is warm and dry.     Capillary  Refill: Capillary refill takes less than 2 seconds.     Findings: Wound present.       Neurological:     General: No focal deficit present.     Mental Status: He is alert and oriented to person, place, and time.     Gait: Gait normal.  Psychiatric:        Mood and Affect: Mood normal.        Behavior: Behavior normal.        Thought Content: Thought content normal.        Judgment: Judgment normal.     Results for orders placed or performed in visit on 01/14/23  Microalbumin / creatinine urine ratio  Result Value Ref Range   Creatinine, Urine 40.7 Not Estab. mg/dL   Microalbumin, Urine <9.1 Not Estab. ug/mL   Microalb/Creat Ratio <7 0 - 29 mg/g creat  Bayer DCA Hb A1c Waived  Result Value Ref Range   HB A1C (BAYER DCA - WAIVED) 8.9 (H) 4.8 - 5.6 %  CBC with Differential/Platelet  Result Value Ref Range   WBC 5.4 3.4 - 10.8 x10E3/uL   RBC 3.61 (L) 4.14 - 5.80 x10E6/uL   Hemoglobin 11.2 (L) 13.0 - 17.7 g/dL   Hematocrit 47.8 (L) 29.5 - 51.0 %   MCV 93 79 - 97 fL   MCH 31.0 26.6 - 33.0 pg   MCHC 33.2 31.5 - 35.7 g/dL   RDW 62.1 30.8 - 65.7 %   Platelets 145 (L) 150 - 450 x10E3/uL   Neutrophils 61 Not Estab. %   Lymphs 17 Not Estab. %   Monocytes 11 Not Estab. %   Eos 10 Not Estab. %   Basos 1 Not Estab. %   Neutrophils Absolute 3.3 1.4 - 7.0 x10E3/uL   Lymphocytes Absolute 0.9 0.7 - 3.1 x10E3/uL   Monocytes Absolute 0.6 0.1 - 0.9 x10E3/uL   EOS (ABSOLUTE) 0.5 (H) 0.0 - 0.4 x10E3/uL   Basophils Absolute 0.1 0.0 - 0.2 x10E3/uL   Immature Granulocytes 0 Not Estab. %   Immature Grans (Abs) 0.0 0.0 - 0.1 x10E3/uL  CMP14+EGFR  Result Value Ref Range   Glucose 237 (H) 70 - 99 mg/dL   BUN 25 8 - 27 mg/dL   Creatinine, Ser 8.46 (H) 0.76 - 1.27 mg/dL   eGFR 50 (L) >96 EX/BMW/4.13   BUN/Creatinine Ratio 18 10 - 24   Sodium 136 134 - 144 mmol/L   Potassium 5.0 3.5 - 5.2 mmol/L   Chloride 101 96 - 106 mmol/L   CO2 22 20 - 29 mmol/L   Calcium 9.2 8.6 - 10.2 mg/dL   Total  Protein 6.0 6.0 - 8.5 g/dL   Albumin 4.2 3.7 - 4.7 g/dL   Globulin, Total 1.8 1.5 - 4.5 g/dL   Bilirubin Total 0.4 0.0 - 1.2 mg/dL   Alkaline Phosphatase 60 44 - 121 IU/L   AST 16 0 - 40 IU/L   ALT 16 0 - 44 IU/L  Lipid panel  Result Value Ref Range   Cholesterol, Total 129 100 - 199 mg/dL   Triglycerides 244 0 - 149 mg/dL   HDL 51 >01 mg/dL   VLDL Cholesterol Cal  19 5 - 40 mg/dL   LDL Chol Calc (NIH) 59 0 - 99 mg/dL   Chol/HDL Ratio 2.5 0.0 - 5.0 ratio       Pertinent labs & imaging results that were available during my care of the patient were reviewed by me and considered in my medical decision making.  Assessment & Plan:  Joe [name] was seen today for medical management of chronic issues.  Diagnoses and all orders for this visit:  Type 2 diabetes mellitus with diabetic neuropathy, with long-term current use of insulin (HCC) -     Bayer DCA Hb A1c Waived -     Flu Vaccine Trivalent High Dose (Fluad)  Hypertension associated with diabetes (HCC) -     Bayer DCA Hb A1c Waived -     Flu Vaccine Trivalent High Dose (Fluad)  Hyperlipidemia associated with type 2 diabetes mellitus (HCC) -     Bayer DCA Hb A1c Waived -     Flu Vaccine Trivalent High Dose (Fluad)  CKD stage 3 due to type 2 diabetes mellitus (HCC) -     Bayer DCA Hb A1c Waived -     Flu Vaccine Trivalent High Dose (Fluad)  Abdominal aortic atherosclerosis (HCC) -     Bayer DCA Hb A1c Waived -     Flu Vaccine Trivalent High Dose (Fluad)  Thrombocytopenia (HCC) -     Bayer DCA Hb A1c Waived -     Flu Vaccine Trivalent High Dose (Fluad)  Unstable gait -     Ambulatory referral to Physical Therapy  Encounter for immunization -     Flu Vaccine Trivalent High Dose (Fluad)     Assessment and Plan    Diabetes Mellitus Type 2 Blood sugars are generally well-controlled with occasional morning and evening hyperglycemia due to nocturnal hypoglycemia and compensatory eating. A1c is 8.3, within the goal of 8. On a  sliding scale insulin regimen and taking Jardiance, metformin, and Januvia. Experiencing nocturnal hypoglycemia, which is concerning. Discussed managing hypoglycemia with a combination of simple and complex carbohydrates and emphasized adherence to the updated sliding scale insulin regimen. - Adjust sliding scale insulin: 0 units if blood sugar <90, 20 units if >160, 22 units if >200 - Recommend consuming a combination of simple and complex carbohydrates (e.g., apple with peanut butter) to manage nocturnal hypoglycemia - Continue Jardiance, metformin, Januvia - Refill Novolog FlexPen for mealtime insulin - Monitor blood sugars and follow up if significant changes occur  Peripheral Neuropathy Increased tingling and decreased sensation in both feet, especially at night. Persistent and possibly worsening. Discussed over-the-counter remedies and potential benefits and side effects of neuropathy medications. Emphasized consistent use of over-the-counter remedies. - Continue over-the-counter remedies as advised by the foot doctor - Consider starting neuropathy medication if symptoms persist or worsen - Check B12 levels to rule out deficiency  Wound on Leg Healing sore on the leg, initially red and possibly infected, now healing well. Not currently infected. Discussed the importance of allowing the scab to heal naturally. - Avoid picking at the scab and allow it to heal naturally  Balance Issues Reports feeling off balance without dizziness. Previous physical therapy showed limited improvement, possibly due to non-compliance with home exercises. Discussed fall risks and the importance of adherence to prescribed exercises. Emphasized potential need for assistive devices if balance does not improve. - Refer to physical therapy for gait stability exercises - Encourage adherence to prescribed exercises - Consider assistive devices if balance does not improve to prevent falls  General Health  Maintenance Blood pressure is well-controlled. No new issues with urination, hunger, or thirst. Recent eye exam was normal. No new sores on feet, but neuropathy persists. Discussed the importance of regular follow-ups for diabetes management and eye exams. - Continue current blood pressure management - Schedule regular follow-ups for diabetes management and eye exams - Monitor for any new symptoms or changes in existing conditions  Follow-up - Follow up with primary care provider as needed - Schedule physical therapy sessions - Monitor blood sugar levels and adjust insulin as per the updated sliding scale.        Total time spent with patient 48 minutes.  Greater than 50% of encounter spent in coordination of care/counseling.   Continue all other maintenance medications.  Follow up plan: Return in about 3 months (around 07/31/2023), or if symptoms worsen or fail to improve, for CPE/DM.   Continue healthy lifestyle choices, including diet (rich in fruits, vegetables, and lean proteins, and low in salt and simple carbohydrates) and exercise (at least 30 minutes of moderate physical activity daily).  Educational handout given for DM  The above assessment and management plan was discussed with the patient. The patient verbalized understanding of and has agreed to the management plan. Patient is aware to call the clinic if they develop any new symptoms or if symptoms persist or worsen. Patient is aware when to return to the clinic for a follow-up visit. Patient educated on when it is appropriate to go to the emergency department.   Kari Baars, FNP-C [facility] [phone]

## 2023-05-07 ENCOUNTER — Other Ambulatory Visit: Payer: Self-pay

## 2023-05-07 ENCOUNTER — Ambulatory Visit: Payer: Medicare PPO | Attending: Family Medicine

## 2023-05-07 DIAGNOSIS — M6281 Muscle weakness (generalized): Secondary | ICD-10-CM | POA: Insufficient documentation

## 2023-05-07 DIAGNOSIS — R2681 Unsteadiness on feet: Secondary | ICD-10-CM | POA: Diagnosis not present

## 2023-05-07 DIAGNOSIS — R2689 Other abnormalities of gait and mobility: Secondary | ICD-10-CM | POA: Diagnosis not present

## 2023-05-07 NOTE — Therapy (Signed)
OUTPATIENT PHYSICAL THERAPY LOWER EXTREMITY EVALUATION   Patient Name: Joe [name] MRN: [id number] DOB:[birth year], 82 y.o., male Today's Date: 05/07/2023  END OF SESSION:  PT End of Session - 05/07/23 1301     Visit Number 1    Number of Visits 8    Date for PT Re-Evaluation 07/04/23    PT Start Time 1303    PT Stop Time 1343    PT Time Calculation (min) 40 min    Activity Tolerance Patient tolerated treatment well    Behavior During Therapy [facility] for tasks assessed/performed             Past Medical History:  Diagnosis Date   Adenomatous colon polyp    Arthritis    Cataract    Chest pain at rest 05/26/2016   Diabetes mellitus without complication (HCC)    Dyslipidemia    ESOPHAGITIS, REFLUX 07/09/2004   Qualifier: Diagnosis of  By: Vear Clock CMA (AAMA), Stephanie     Hiatal hernia    HOH (hard of hearing)    has bilateral Hearing aids   Hypertension    Irritable bowel syndrome    Kidney stone 1970   Pneumonia    Tick bites    took 5 rounds of Doxycycline this summer   Past Surgical History:  Procedure Laterality Date   COLONOSCOPY     ESOPHAGEAL DILATION  1996   ESOPHAGEAL DILATION  2003   FLEXIBLE SIGMOIDOSCOPY     HEMICOLECTOMY  08/2005   Right   KNEE SURGERY Right    TONSILLECTOMY     Patient Active Problem List   Diagnosis Date Noted   Sensorineural hearing loss (SNHL), bilateral 11/11/2022   Geographic tongue 07/11/2022   Neck sprain 02/11/2022   Gait instability 10/09/2021   Exposure to Agent Orange 04/04/2021   Seborrheic dermatitis, unspecified 04/04/2021   Presbycusis of both ears 05/09/2020   Chronic mycotic otitis externa 09/29/2019   Chronic left shoulder pain 08/13/2019   CKD stage 3 due to type 2 diabetes mellitus (HCC) 01/27/2019   Erectile dysfunction due to arterial insufficiency 04/15/2018   Thrombocytopenia (HCC) 03/18/2015   Atopic dermatitis 01/23/2015   Thoracic disc herniation 06/20/2014   Left varicocele 06/20/2014    Abdominal aortic atherosclerosis (HCC) 06/20/2014   Ventral hernia without obstruction or gangrene 02/04/2014   History of colonic polyps 02/04/2014   Hyperlipidemia associated with type 2 diabetes mellitus (HCC) 12/30/2012   Hypertension associated with diabetes (HCC) 12/30/2012   Benign prostatic hyperplasia 12/30/2012   Type 2 diabetes mellitus with diabetic neuropathy, with long-term current use of insulin (HCC) 09/28/2012   RBBB 04/11/2010   Cardiovascular function study, abnormal 04/11/2010   Gastroesophageal reflux disease without esophagitis 09/14/2007   Nephrolithiasis 09/14/2007   REFERRING PROVIDER: Sonny Masters, FNP   REFERRING DIAG: Unstable gait   THERAPY DIAG:  Other abnormalities of gait and mobility  Muscle weakness (generalized)  Rationale for Evaluation and Treatment: Rehabilitation  ONSET DATE: chronic  SUBJECTIVE:   SUBJECTIVE STATEMENT: Patient reports that he has been unsteady walking for while. His wife notes that he did not follow through with his home exercise program after his last episode of physical therapy. He fell a few weeks ago while he was outside at night when he tripped over a rock wall. He was not using a flashlight or anything to help him see the wall. However, he was not injured. He also has a hard time standing up if he gets on his knees.  PERTINENT HISTORY: Hypertension, diabetes, diabetic neuropathy, chronic kidney disease, hearing loss, and arthritis PAIN:  Are you having pain? Yes: NPRS scale: 3/10 Pain location: right leg Pain description: sore  PRECAUTIONS: Fall  RED FLAGS: None   WEIGHT BEARING RESTRICTIONS: No  FALLS:  Has patient fallen in last 6 months? Yes. Number of falls 1  LIVING ENVIRONMENT: Lives with: lives with their spouse Lives in: House/apartment Stairs: Yes: Internal: 12 steps; on right going up and External: 2 steps; can reach both Has following equipment at home: None  OCCUPATION: retired  PLOF:  Independent  PATIENT GOALS: improved stability, safety, and ease with transfers  NEXT MD VISIT: 07/22/22  OBJECTIVE:  Note: Objective measures were completed at Evaluation unless otherwise noted.  COGNITION: Overall cognitive status: Within functional limits for tasks assessed     SENSATION: Light touch: WFL Patient reports intermittent numbness and tingling in both feet, but none reported currently.   EDEMA:  No edema observed  LOWER EXTREMITY ROM:  LOWER EXTREMITY MMT:  MMT Right eval Left eval  Hip flexion 3/5; sore 3+/5  Hip extension    Hip abduction    Hip adduction    Hip internal rotation    Hip external rotation    Knee flexion 4/5 4+/5  Knee extension 3+/5 4+/5  Ankle dorsiflexion 4/5 4/5  Ankle plantarflexion    Ankle inversion    Ankle eversion     (Blank rows = not tested)  FUNCTIONAL TESTS:  5 times sit to stand: 29.89 seconds without UE support Timed up and go (TUG): 16.95 seconds Sit to stand transfers: multiple attempts required Stand to sit transfers: Uncontrolled descent  GAIT: Assistive device utilized: None Level of assistance: Complete Independence Comments: Intermittent shuffling pattern   TODAY'S TREATMENT:                                                                                                                              DATE:     PATIENT EDUCATION:  Education details: Plan of care, prognosis, benefits of exercise, safety, activity modification, and goals for physical therapy Person educated: Patient and Spouse Education method: Explanation Education comprehension: verbalized understanding  HOME EXERCISE PROGRAM:   ASSESSMENT:  CLINICAL IMPRESSION: Patient is a 82 y.o. male who was seen today for physical therapy evaluation and treatment for unsteadiness on his feet. He is at an elevated risk of falling as evidenced by his objective measures, gait mechanics, and his previous fall. Recommend that he continue with  skilled physical therapy to address his impairments to maximize his safety and functional mobility.  OBJECTIVE IMPAIRMENTS: Abnormal gait, decreased balance, decreased mobility, difficulty walking, and decreased strength.   ACTIVITY LIMITATIONS: stairs and locomotion level  PARTICIPATION LIMITATIONS: meal prep, cleaning, laundry, shopping, and community activity  PERSONAL FACTORS: Age, Time since onset of injury/illness/exacerbation, and 3+ comorbidities: Hypertension, diabetes, diabetic neuropathy, chronic kidney disease, hearing loss, and arthritis  are also affecting patient's functional outcome.  REHAB POTENTIAL: Good  CLINICAL DECISION MAKING: Evolving/moderate complexity  EVALUATION COMPLEXITY: Moderate   GOALS: Goals reviewed with patient? Yes  LONG TERM GOALS: Target date: 06/04/23  Patient will be independent with his HEP. Baseline:  Goal status: INITIAL  2.  Patient will be able to recall the benefits of performing his HEP and exercising regularly. Baseline:  Goal status: INITIAL  3.  Patient will improve his timed up and go time to 12 seconds or less for reduced fall risk. Baseline:  Goal status: INITIAL  4.  Patient will improve his 5 times sit to stand time to 15 seconds or less for improved lower extremity power. Baseline:  Goal status: INITIAL  PLAN:  PT FREQUENCY: 2x/week  PT DURATION: 4 weeks  PLANNED INTERVENTIONS: 97164- PT Re-evaluation, 97110-Therapeutic exercises, 97530- Therapeutic activity, 97112- Neuromuscular re-education, 97535- Self Care, 75643- Manual therapy, [phone]- Gait training, Patient/Family education, Balance training, and Stair training  PLAN FOR NEXT SESSION: NuStep, gait training, lower extremity strengthening, balance interventions   Granville Lewis, PT 05/07/2023, 3:21 PM

## 2023-05-12 ENCOUNTER — Other Ambulatory Visit: Payer: Self-pay | Admitting: Family Medicine

## 2023-05-12 ENCOUNTER — Ambulatory Visit: Payer: Medicare PPO

## 2023-05-12 DIAGNOSIS — E1169 Type 2 diabetes mellitus with other specified complication: Secondary | ICD-10-CM

## 2023-05-12 DIAGNOSIS — R2689 Other abnormalities of gait and mobility: Secondary | ICD-10-CM | POA: Diagnosis not present

## 2023-05-12 DIAGNOSIS — M6281 Muscle weakness (generalized): Secondary | ICD-10-CM

## 2023-05-12 NOTE — Therapy (Signed)
OUTPATIENT PHYSICAL THERAPY LOWER EXTREMITY TREATMENT   Patient Name: Joe [name] MRN: [id number] DOB:1941/04/17, 82 y.o., male Today's Date: 05/12/2023  END OF SESSION:  PT End of Session - 05/12/23 1021     Visit Number 2    Number of Visits 8    Date for PT Re-Evaluation 07/04/23    PT Start Time 1015    PT Stop Time 1100    PT Time Calculation (min) 45 min    Activity Tolerance Patient tolerated treatment well    Behavior During Therapy [facility] for tasks assessed/performed              Past Medical History:  Diagnosis Date   Adenomatous colon polyp    Arthritis    Cataract    Chest pain at rest 05/26/2016   Diabetes mellitus without complication (HCC)    Dyslipidemia    ESOPHAGITIS, REFLUX 07/09/2004   Qualifier: Diagnosis of  By: Vear Clock CMA (AAMA), Stephanie     Hiatal hernia    HOH (hard of hearing)    has bilateral Hearing aids   Hypertension    Irritable bowel syndrome    Kidney stone 1970   Pneumonia    Tick bites    took 5 rounds of Doxycycline this summer   Past Surgical History:  Procedure Laterality Date   COLONOSCOPY     ESOPHAGEAL DILATION  1996   ESOPHAGEAL DILATION  2003   FLEXIBLE SIGMOIDOSCOPY     HEMICOLECTOMY  08/2005   Right   KNEE SURGERY Right    TONSILLECTOMY     Patient Active Problem List   Diagnosis Date Noted   Sensorineural hearing loss (SNHL), bilateral 11/11/2022   Geographic tongue 07/11/2022   Neck sprain 02/11/2022   Gait instability 10/09/2021   Exposure to Agent Orange 04/04/2021   Seborrheic dermatitis, unspecified 04/04/2021   Presbycusis of both ears 05/09/2020   Chronic mycotic otitis externa 09/29/2019   Chronic left shoulder pain 08/13/2019   CKD stage 3 due to type 2 diabetes mellitus (HCC) 01/27/2019   Erectile dysfunction due to arterial insufficiency 04/15/2018   Thrombocytopenia (HCC) 03/18/2015   Atopic dermatitis 01/23/2015   Thoracic disc herniation 06/20/2014   Left varicocele 06/20/2014    Abdominal aortic atherosclerosis (HCC) 06/20/2014   Ventral hernia without obstruction or gangrene 02/04/2014   History of colonic polyps 02/04/2014   Hyperlipidemia associated with type 2 diabetes mellitus (HCC) 12/30/2012   Hypertension associated with diabetes (HCC) 12/30/2012   Benign prostatic hyperplasia 12/30/2012   Type 2 diabetes mellitus with diabetic neuropathy, with long-term current use of insulin (HCC) 09/28/2012   RBBB 04/11/2010   Cardiovascular function study, abnormal 04/11/2010   Gastroesophageal reflux disease without esophagitis 09/14/2007   Nephrolithiasis 09/14/2007   REFERRING PROVIDER: Sonny Masters, FNP   REFERRING DIAG: Unstable gait   THERAPY DIAG:  Other abnormalities of gait and mobility  Muscle weakness (generalized)  Rationale for Evaluation and Treatment: Rehabilitation  ONSET DATE: chronic  SUBJECTIVE:   SUBJECTIVE STATEMENT: Patient reports that he feels good today.   PERTINENT HISTORY: Hypertension, diabetes, diabetic neuropathy, chronic kidney disease, hearing loss, and arthritis PAIN:  Are you having pain? Yes: NPRS scale: 3/10 Pain location: right leg Pain description: sore  PRECAUTIONS: Fall  RED FLAGS: None   WEIGHT BEARING RESTRICTIONS: No  FALLS:  Has patient fallen in last 6 months? Yes. Number of falls 1  LIVING ENVIRONMENT: Lives with: lives with their spouse Lives in: House/apartment Stairs: Yes: Internal: 12 steps;  on right going up and External: 2 steps; can reach both Has following equipment at home: None  OCCUPATION: retired  PLOF: Independent  PATIENT GOALS: improved stability, safety, and ease with transfers  NEXT MD VISIT: 07/22/22  OBJECTIVE:  Note: Objective measures were completed at Evaluation unless otherwise noted.  COGNITION: Overall cognitive status: Within functional limits for tasks assessed     SENSATION: Light touch: WFL Patient reports intermittent numbness and tingling in both  feet, but none reported currently.   EDEMA:  No edema observed  LOWER EXTREMITY ROM:  LOWER EXTREMITY MMT:  MMT Right eval Left eval  Hip flexion 3/5; sore 3+/5  Hip extension    Hip abduction    Hip adduction    Hip internal rotation    Hip external rotation    Knee flexion 4/5 4+/5  Knee extension 3+/5 4+/5  Ankle dorsiflexion 4/5 4/5  Ankle plantarflexion    Ankle inversion    Ankle eversion     (Blank rows = not tested)  FUNCTIONAL TESTS:  5 times sit to stand: 29.89 seconds without UE support Timed up and go (TUG): 16.95 seconds Sit to stand transfers: multiple attempts required Stand to sit transfers: Uncontrolled descent  GAIT: Assistive device utilized: None Level of assistance: Complete Independence Comments: Intermittent shuffling pattern   TODAY'S TREATMENT:                                                                                                                              DATE:                                     05/12/23 EXERCISE LOG  Exercise Repetitions and Resistance Comments  Nustep  L4 x 15 minutes   Marching on foam  2 minutes  BUE support   Static stance on foam  3 minutes  Intermittent UE and minA for safety due to posterior LOB  Standing toe raise  20 reps  BUE support   Standing hip ABD  20 reps each    Seated hip ADD isometric  3 minutes w/ 5 second hold   LAQ 3# x 3 minutes  Alternating LE  Seated marching  10 reps each  Limited by right thigh pain  Sit to stand  10 reps  With UE support   Blank cell = exercise not performed today   PATIENT EDUCATION:  Education details: HEP, benefits of exercise, and expectation for soreness Person educated: Patient and Spouse Education method: Explanation Education comprehension: verbalized understanding  HOME EXERCISE PROGRAM: [id number]  ASSESSMENT:  CLINICAL IMPRESSION: Patient was introduced to multiple new interventions for improved lower extremity strength and stability. He  required close supervision while standing on a foam pad due to intermittent posterior balance loss. He experienced a brief increase in right thigh pain when seated marching was attempted which resulted in this intervention  being held at this time. However, this pain did not limit his ability to complete any of today's other interventions. He reported feeling "not too bad" upon the conclusion of treatment. He continues to require skilled physical therapy to address his remaining impairments to maximize his safety and functional mobility.   OBJECTIVE IMPAIRMENTS: Abnormal gait, decreased balance, decreased mobility, difficulty walking, and decreased strength.   ACTIVITY LIMITATIONS: stairs and locomotion level  PARTICIPATION LIMITATIONS: meal prep, cleaning, laundry, shopping, and community activity  PERSONAL FACTORS: Age, Time since onset of injury/illness/exacerbation, and 3+ comorbidities: Hypertension, diabetes, diabetic neuropathy, chronic kidney disease, hearing loss, and arthritis  are also affecting patient's functional outcome.   REHAB POTENTIAL: Good  CLINICAL DECISION MAKING: Evolving/moderate complexity  EVALUATION COMPLEXITY: Moderate   GOALS: Goals reviewed with patient? Yes  LONG TERM GOALS: Target date: 06/04/23  Patient will be independent with his HEP. Baseline:  Goal status: INITIAL  2.  Patient will be able to recall the benefits of performing his HEP and exercising regularly. Baseline:  Goal status: INITIAL  3.  Patient will improve his timed up and go time to 12 seconds or less for reduced fall risk. Baseline:  Goal status: INITIAL  4.  Patient will improve his 5 times sit to stand time to 15 seconds or less for improved lower extremity power. Baseline:  Goal status: INITIAL  PLAN:  PT FREQUENCY: 2x/week  PT DURATION: 4 weeks  PLANNED INTERVENTIONS: 97164- PT Re-evaluation, 97110-Therapeutic exercises, 97530- Therapeutic activity, 97112- Neuromuscular  re-education, 97535- Self Care, 06301- Manual therapy, [phone]- Gait training, Patient/Family education, Balance training, and Stair training  PLAN FOR NEXT SESSION: NuStep, gait training, lower extremity strengthening, balance interventions   Granville Lewis, PT 05/12/2023, 12:28 PM

## 2023-05-15 ENCOUNTER — Ambulatory Visit: Payer: Medicare PPO

## 2023-05-15 DIAGNOSIS — R2689 Other abnormalities of gait and mobility: Secondary | ICD-10-CM

## 2023-05-15 DIAGNOSIS — M6281 Muscle weakness (generalized): Secondary | ICD-10-CM

## 2023-05-15 NOTE — Therapy (Signed)
OUTPATIENT PHYSICAL THERAPY LOWER EXTREMITY TREATMENT   Patient Name: Joe [name] MRN: [id number] DOB:1940/11/12, 82 y.o., male Today's Date: 05/15/2023  END OF SESSION:  PT End of Session - 05/15/23 1021     Visit Number 3    Number of Visits 8    Date for PT Re-Evaluation 07/04/23    PT Start Time 1015    PT Stop Time 1113    PT Time Calculation (min) 58 min    Activity Tolerance Patient tolerated treatment well    Behavior During Therapy [facility] for tasks assessed/performed              Past Medical History:  Diagnosis Date   Adenomatous colon polyp    Arthritis    Cataract    Chest pain at rest 05/26/2016   Diabetes mellitus without complication (HCC)    Dyslipidemia    ESOPHAGITIS, REFLUX 07/09/2004   Qualifier: Diagnosis of  By: Vear Clock CMA (AAMA), Stephanie     Hiatal hernia    HOH (hard of hearing)    has bilateral Hearing aids   Hypertension    Irritable bowel syndrome    Kidney stone 1970   Pneumonia    Tick bites    took 5 rounds of Doxycycline this summer   Past Surgical History:  Procedure Laterality Date   COLONOSCOPY     ESOPHAGEAL DILATION  1996   ESOPHAGEAL DILATION  2003   FLEXIBLE SIGMOIDOSCOPY     HEMICOLECTOMY  08/2005   Right   KNEE SURGERY Right    TONSILLECTOMY     Patient Active Problem List   Diagnosis Date Noted   Sensorineural hearing loss (SNHL), bilateral 11/11/2022   Geographic tongue 07/11/2022   Neck sprain 02/11/2022   Gait instability 10/09/2021   Exposure to Agent Orange 04/04/2021   Seborrheic dermatitis, unspecified 04/04/2021   Presbycusis of both ears 05/09/2020   Chronic mycotic otitis externa 09/29/2019   Chronic left shoulder pain 08/13/2019   CKD stage 3 due to type 2 diabetes mellitus (HCC) 01/27/2019   Erectile dysfunction due to arterial insufficiency 04/15/2018   Thrombocytopenia (HCC) 03/18/2015   Atopic dermatitis 01/23/2015   Thoracic disc herniation 06/20/2014   Left varicocele 06/20/2014    Abdominal aortic atherosclerosis (HCC) 06/20/2014   Ventral hernia without obstruction or gangrene 02/04/2014   History of colonic polyps 02/04/2014   Hyperlipidemia associated with type 2 diabetes mellitus (HCC) 12/30/2012   Hypertension associated with diabetes (HCC) 12/30/2012   Benign prostatic hyperplasia 12/30/2012   Type 2 diabetes mellitus with diabetic neuropathy, with long-term current use of insulin (HCC) 09/28/2012   RBBB 04/11/2010   Cardiovascular function study, abnormal 04/11/2010   Gastroesophageal reflux disease without esophagitis 09/14/2007   Nephrolithiasis 09/14/2007   REFERRING PROVIDER: Sonny Masters, FNP   REFERRING DIAG: Unstable gait   THERAPY DIAG:  Other abnormalities of gait and mobility  Muscle weakness (generalized)  Rationale for Evaluation and Treatment: Rehabilitation  ONSET DATE: chronic  SUBJECTIVE:   SUBJECTIVE STATEMENT: Patient reports minimal soreness today from performing HEP.  PERTINENT HISTORY: Hypertension, diabetes, diabetic neuropathy, chronic kidney disease, hearing loss, and arthritis PAIN:  Are you having pain? Yes: NPRS scale: 3/10 Pain location: right leg Pain description: sore  PRECAUTIONS: Fall  RED FLAGS: None   WEIGHT BEARING RESTRICTIONS: No  FALLS:  Has patient fallen in last 6 months? Yes. Number of falls 1  LIVING ENVIRONMENT: Lives with: lives with their spouse Lives in: House/apartment Stairs: Yes: Internal: 12 steps;  on right going up and External: 2 steps; can reach both Has following equipment at home: None  OCCUPATION: retired  PLOF: Independent  PATIENT GOALS: improved stability, safety, and ease with transfers  NEXT MD VISIT: 07/22/22  OBJECTIVE:  Note: Objective measures were completed at Evaluation unless otherwise noted.  COGNITION: Overall cognitive status: Within functional limits for tasks assessed     SENSATION: Light touch: WFL Patient reports intermittent numbness and  tingling in both feet, but none reported currently.   EDEMA:  No edema observed  LOWER EXTREMITY ROM:  LOWER EXTREMITY MMT:  MMT Right eval Left eval  Hip flexion 3/5; sore 3+/5  Hip extension    Hip abduction    Hip adduction    Hip internal rotation    Hip external rotation    Knee flexion 4/5 4+/5  Knee extension 3+/5 4+/5  Ankle dorsiflexion 4/5 4/5  Ankle plantarflexion    Ankle inversion    Ankle eversion     (Blank rows = not tested)  FUNCTIONAL TESTS:  5 times sit to stand: 29.89 seconds without UE support Timed up and go (TUG): 16.95 seconds Sit to stand transfers: multiple attempts required Stand to sit transfers: Uncontrolled descent  GAIT: Assistive device utilized: None Level of assistance: Complete Independence Comments: Intermittent shuffling pattern   TODAY'S TREATMENT:                                                                                                                              DATE:                                     05/15/23 EXERCISE LOG  Exercise Repetitions and Resistance Comments  Nustep  L4 x 15 minutes   Rockerboard 3 mins   Marching on foam  3 minutes  BUE support   Side Stepping 3 minutes  Intermittent UE and minA for safety   Standing toe raise  25 reps  BUE support   Standing hip ABD  25 reps each    Seated hip ADD isometric  3 minutes w/ 5 second hold   LAQ 3# x 3.5 minutes  Alternating LE  Seated marching  3# 20 reps bil Limited by right thigh pain  Sit to stand  10 reps  With UE support   Blank cell = exercise not performed today   PATIENT EDUCATION:  Education details: HEP, benefits of exercise, and expectation for soreness Person educated: Patient and Spouse Education method: Explanation Education comprehension: verbalized understanding  HOME EXERCISE PROGRAM: [id number]  ASSESSMENT:  CLINICAL IMPRESSION: Pt arrives for today's treatment session reporting minimal BLE soreness due to performing HEP.  Pt  able to tolerate increased time and/or reps with all previously performed exercises.  Pt introduced to rockerboard and side-stepping on [employer] balance beam today.   Pt challenged by STS exercises at end of  treatment session due to fatigue.  Pt reported minimal RLE soreness at completion of today's treatment session.   OBJECTIVE IMPAIRMENTS: Abnormal gait, decreased balance, decreased mobility, difficulty walking, and decreased strength.   ACTIVITY LIMITATIONS: stairs and locomotion level  PARTICIPATION LIMITATIONS: meal prep, cleaning, laundry, shopping, and community activity  PERSONAL FACTORS: Age, Time since onset of injury/illness/exacerbation, and 3+ comorbidities: Hypertension, diabetes, diabetic neuropathy, chronic kidney disease, hearing loss, and arthritis  are also affecting patient's functional outcome.   REHAB POTENTIAL: Good  CLINICAL DECISION MAKING: Evolving/moderate complexity  EVALUATION COMPLEXITY: Moderate   GOALS: Goals reviewed with patient? Yes  LONG TERM GOALS: Target date: 06/04/23  Patient will be independent with his HEP. Baseline:  Goal status: INITIAL  2.  Patient will be able to recall the benefits of performing his HEP and exercising regularly. Baseline:  Goal status: INITIAL  3.  Patient will improve his timed up and go time to 12 seconds or less for reduced fall risk. Baseline:  Goal status: INITIAL  4.  Patient will improve his 5 times sit to stand time to 15 seconds or less for improved lower extremity power. Baseline:  Goal status: INITIAL  PLAN:  PT FREQUENCY: 2x/week  PT DURATION: 4 weeks  PLANNED INTERVENTIONS: 97164- PT Re-evaluation, 97110-Therapeutic exercises, 97530- Therapeutic activity, 97112- Neuromuscular re-education, 97535- Self Care, 57846- Manual therapy, [phone]- Gait training, Patient/Family education, Balance training, and Stair training  PLAN FOR NEXT SESSION: NuStep, gait training, lower extremity strengthening, balance  interventions   Newman Pies, PTA 05/15/2023, 11:16 AM

## 2023-05-20 ENCOUNTER — Ambulatory Visit: Payer: Medicare PPO | Admitting: *Deleted

## 2023-05-20 DIAGNOSIS — R2689 Other abnormalities of gait and mobility: Secondary | ICD-10-CM | POA: Diagnosis not present

## 2023-05-20 DIAGNOSIS — M6281 Muscle weakness (generalized): Secondary | ICD-10-CM

## 2023-05-20 NOTE — Therapy (Signed)
OUTPATIENT PHYSICAL THERAPY LOWER EXTREMITY TREATMENT   Patient Name: Joe [name] MRN: [id number] DOB:05/25/1941, 82 y.o., male Today's Date: 05/20/2023  END OF SESSION:  PT End of Session - 05/20/23 1119     Visit Number 4    Number of Visits 8    Date for PT Re-Evaluation 07/04/23    PT Start Time 1100    PT Stop Time 1148    PT Time Calculation (min) 48 min              Past Medical History:  Diagnosis Date   Adenomatous colon polyp    Arthritis    Cataract    Chest pain at rest 05/26/2016   Diabetes mellitus without complication (HCC)    Dyslipidemia    ESOPHAGITIS, REFLUX 07/09/2004   Qualifier: Diagnosis of  By: Vear Clock CMA Duncan Dull), Stephanie     Hiatal hernia    HOH (hard of hearing)    has bilateral Hearing aids   Hypertension    Irritable bowel syndrome    Kidney stone 1970   Pneumonia    Tick bites    took 5 rounds of Doxycycline this summer   Past Surgical History:  Procedure Laterality Date   COLONOSCOPY     ESOPHAGEAL DILATION  1996   ESOPHAGEAL DILATION  2003   FLEXIBLE SIGMOIDOSCOPY     HEMICOLECTOMY  08/2005   Right   KNEE SURGERY Right    TONSILLECTOMY     Patient Active Problem List   Diagnosis Date Noted   Sensorineural hearing loss (SNHL), bilateral 11/11/2022   Geographic tongue 07/11/2022   Neck sprain 02/11/2022   Gait instability 10/09/2021   Exposure to Agent Orange 04/04/2021   Seborrheic dermatitis, unspecified 04/04/2021   Presbycusis of both ears 05/09/2020   Chronic mycotic otitis externa 09/29/2019   Chronic left shoulder pain 08/13/2019   CKD stage 3 due to type 2 diabetes mellitus (HCC) 01/27/2019   Erectile dysfunction due to arterial insufficiency 04/15/2018   Thrombocytopenia (HCC) 03/18/2015   Atopic dermatitis 01/23/2015   Thoracic disc herniation 06/20/2014   Left varicocele 06/20/2014   Abdominal aortic atherosclerosis (HCC) 06/20/2014   Ventral hernia without obstruction or gangrene 02/04/2014    History of colonic polyps 02/04/2014   Hyperlipidemia associated with type 2 diabetes mellitus (HCC) 12/30/2012   Hypertension associated with diabetes (HCC) 12/30/2012   Benign prostatic hyperplasia 12/30/2012   Type 2 diabetes mellitus with diabetic neuropathy, with long-term current use of insulin (HCC) 09/28/2012   RBBB 04/11/2010   Cardiovascular function study, abnormal 04/11/2010   Gastroesophageal reflux disease without esophagitis 09/14/2007   Nephrolithiasis 09/14/2007   REFERRING PROVIDER: Sonny Masters, FNP   REFERRING DIAG: Unstable gait   THERAPY DIAG:  Other abnormalities of gait and mobility  Muscle weakness (generalized)  Rationale for Evaluation and Treatment: Rehabilitation  ONSET DATE: chronic  SUBJECTIVE:   SUBJECTIVE STATEMENT: Patient reports doing okay today.  PERTINENT HISTORY: Hypertension, diabetes, diabetic neuropathy, chronic kidney disease, hearing loss, and arthritis PAIN:  Are you having pain? Yes: NPRS scale: 3/10 Pain location: right leg Pain description: sore  PRECAUTIONS: Fall  RED FLAGS: None   WEIGHT BEARING RESTRICTIONS: No  FALLS:  Has patient fallen in last 6 months? Yes. Number of falls 1  LIVING ENVIRONMENT: Lives with: lives with their spouse Lives in: House/apartment Stairs: Yes: Internal: 12 steps; on right going up and External: 2 steps; can reach both Has following equipment at home: None  OCCUPATION: retired  PLOF:  Independent  PATIENT GOALS: improved stability, safety, and ease with transfers  NEXT MD VISIT: 07/22/22  OBJECTIVE:  Note: Objective measures were completed at Evaluation unless otherwise noted.  COGNITION: Overall cognitive status: Within functional limits for tasks assessed     SENSATION: Light touch: WFL Patient reports intermittent numbness and tingling in both feet, but none reported currently.   EDEMA:  No edema observed  LOWER EXTREMITY ROM:  LOWER EXTREMITY MMT:  MMT  Right eval Left eval  Hip flexion 3/5; sore 3+/5  Hip extension    Hip abduction    Hip adduction    Hip internal rotation    Hip external rotation    Knee flexion 4/5 4+/5  Knee extension 3+/5 4+/5  Ankle dorsiflexion 4/5 4/5  Ankle plantarflexion    Ankle inversion    Ankle eversion     (Blank rows = not tested)  FUNCTIONAL TESTS:  5 times sit to stand: 29.89 seconds without UE support Timed up and go (TUG): 16.95 seconds Sit to stand transfers: multiple attempts required Stand to sit transfers: Uncontrolled descent  GAIT: Assistive device utilized: None Level of assistance: Complete Independence Comments: Intermittent shuffling pattern   TODAY'S TREATMENT:                                                                                                                              DATE:                                     05/20/23 EXERCISE LOG  Exercise Repetitions and Resistance Comments  Nustep  L1   x 15 minutes   Rockerboard 5  mins   Marching on foam   BUE support   Side Stepping  Intermittent UE and minA for safety   Standing toe raise   BUE support   Standing hip ABD     Tandem stance X 3 each side   8 in toe tap  3x10 each side   Seated hip ADD isometric  3 minutes w/ 5 second hold   LAQ 3#  3x10 Bil. Alternating LE  Seated marching  3#   3x 10 Bil Limited by right thigh pain   4-5/10  Sit to stand  10 reps  no UE assist With UE support   Blank cell = exercise not performed today   PATIENT EDUCATION:  Education details: HEP, benefits of exercise, and expectation for soreness Person educated: Patient and Spouse Education method: Explanation Education comprehension: verbalized understanding  HOME EXERCISE PROGRAM: [id number]  ASSESSMENT:  CLINICAL IMPRESSION: Pt arrives for today's treatment session reporting feeling fairly well today. Rx focused on LE strengthenoing as well as balance act.'s.  Pt able to tolerate increased time and/or reps with all  previously performed exercises.Improved sit to stand today without UE assist.      OBJECTIVE IMPAIRMENTS: Abnormal gait, decreased balance,  decreased mobility, difficulty walking, and decreased strength.   ACTIVITY LIMITATIONS: stairs and locomotion level  PARTICIPATION LIMITATIONS: meal prep, cleaning, laundry, shopping, and community activity  PERSONAL FACTORS: Age, Time since onset of injury/illness/exacerbation, and 3+ comorbidities: Hypertension, diabetes, diabetic neuropathy, chronic kidney disease, hearing loss, and arthritis  are also affecting patient's functional outcome.   REHAB POTENTIAL: Good  CLINICAL DECISION MAKING: Evolving/moderate complexity  EVALUATION COMPLEXITY: Moderate   GOALS: Goals reviewed with patient? Yes  LONG TERM GOALS: Target date: 06/04/23  Patient will be independent with his HEP. Baseline:  Goal status: INITIAL  2.  Patient will be able to recall the benefits of performing his HEP and exercising regularly. Baseline:  Goal status: INITIAL  3.  Patient will improve his timed up and go time to 12 seconds or less for reduced fall risk. Baseline:  Goal status: INITIAL  4.  Patient will improve his 5 times sit to stand time to 15 seconds or less for improved lower extremity power. Baseline:  Goal status: INITIAL  PLAN:  PT FREQUENCY: 2x/week  PT DURATION: 4 weeks  PLANNED INTERVENTIONS: 97164- PT Re-evaluation, 97110-Therapeutic exercises, 97530- Therapeutic activity, 97112- Neuromuscular re-education, 97535- Self Care, 16109- Manual therapy, [phone]- Gait training, Patient/Family education, Balance training, and Stair training  PLAN FOR NEXT SESSION: NuStep, gait training, lower extremity strengthening, balance interventions   Odie Rauen,CHRIS, PTA 05/20/2023, 1:35 PM

## 2023-05-23 ENCOUNTER — Ambulatory Visit: Payer: Medicare PPO

## 2023-05-23 DIAGNOSIS — R2689 Other abnormalities of gait and mobility: Secondary | ICD-10-CM | POA: Diagnosis not present

## 2023-05-23 DIAGNOSIS — M6281 Muscle weakness (generalized): Secondary | ICD-10-CM

## 2023-05-23 NOTE — Therapy (Signed)
OUTPATIENT PHYSICAL THERAPY LOWER EXTREMITY TREATMENT   Patient Name: Joe [name] MRN: [id number] DOB:[birth year], 82 y.o., male Today's Date: 05/23/2023  END OF SESSION:  PT End of Session - 05/23/23 1019     Visit Number 5    Number of Visits 8    Date for PT Re-Evaluation 07/04/23    PT Start Time 1015    PT Stop Time 1100    PT Time Calculation (min) 45 min              Past Medical History:  Diagnosis Date   Adenomatous colon polyp    Arthritis    Cataract    Chest pain at rest 05/26/2016   Diabetes mellitus without complication (HCC)    Dyslipidemia    ESOPHAGITIS, REFLUX 07/09/2004   Qualifier: Diagnosis of  By: Vear Clock CMA Duncan Dull), Stephanie     Hiatal hernia    HOH (hard of hearing)    has bilateral Hearing aids   Hypertension    Irritable bowel syndrome    Kidney stone 1970   Pneumonia    Tick bites    took 5 rounds of Doxycycline this summer   Past Surgical History:  Procedure Laterality Date   COLONOSCOPY     ESOPHAGEAL DILATION  1996   ESOPHAGEAL DILATION  2003   FLEXIBLE SIGMOIDOSCOPY     HEMICOLECTOMY  08/2005   Right   KNEE SURGERY Right    TONSILLECTOMY     Patient Active Problem List   Diagnosis Date Noted   Sensorineural hearing loss (SNHL), bilateral 11/11/2022   Geographic tongue 07/11/2022   Neck sprain 02/11/2022   Gait instability 10/09/2021   Exposure to Agent Orange 04/04/2021   Seborrheic dermatitis, unspecified 04/04/2021   Presbycusis of both ears 05/09/2020   Chronic mycotic otitis externa 09/29/2019   Chronic left shoulder pain 08/13/2019   CKD stage 3 due to type 2 diabetes mellitus (HCC) 01/27/2019   Erectile dysfunction due to arterial insufficiency 04/15/2018   Thrombocytopenia (HCC) 03/18/2015   Atopic dermatitis 01/23/2015   Thoracic disc herniation 06/20/2014   Left varicocele 06/20/2014   Abdominal aortic atherosclerosis (HCC) 06/20/2014   Ventral hernia without obstruction or gangrene 02/04/2014    History of colonic polyps 02/04/2014   Hyperlipidemia associated with type 2 diabetes mellitus (HCC) 12/30/2012   Hypertension associated with diabetes (HCC) 12/30/2012   Benign prostatic hyperplasia 12/30/2012   Type 2 diabetes mellitus with diabetic neuropathy, with long-term current use of insulin (HCC) 09/28/2012   RBBB 04/11/2010   Cardiovascular function study, abnormal 04/11/2010   Gastroesophageal reflux disease without esophagitis 09/14/2007   Nephrolithiasis 09/14/2007   REFERRING PROVIDER: Sonny Masters, FNP   REFERRING DIAG: Unstable gait   THERAPY DIAG:  Other abnormalities of gait and mobility  Muscle weakness (generalized)  Rationale for Evaluation and Treatment: Rehabilitation  ONSET DATE: chronic  SUBJECTIVE:   SUBJECTIVE STATEMENT: Pt reports 3/10 right leg pain with activity.   PERTINENT HISTORY: Hypertension, diabetes, diabetic neuropathy, chronic kidney disease, hearing loss, and arthritis PAIN:  Are you having pain? Yes: NPRS scale: 3/10 Pain location: right leg Pain description: sore  PRECAUTIONS: Fall  RED FLAGS: None   WEIGHT BEARING RESTRICTIONS: No  FALLS:  Has patient fallen in last 6 months? Yes. Number of falls 1  LIVING ENVIRONMENT: Lives with: lives with their spouse Lives in: House/apartment Stairs: Yes: Internal: 12 steps; on right going up and External: 2 steps; can reach both Has following equipment at home: None  OCCUPATION: retired  PLOF: Independent  PATIENT GOALS: improved stability, safety, and ease with transfers  NEXT MD VISIT: 07/22/22  OBJECTIVE:  Note: Objective measures were completed at Evaluation unless otherwise noted.  COGNITION: Overall cognitive status: Within functional limits for tasks assessed     SENSATION: Light touch: WFL Patient reports intermittent numbness and tingling in both feet, but none reported currently.   EDEMA:  No edema observed  LOWER EXTREMITY ROM:  LOWER EXTREMITY  MMT:  MMT Right eval Left eval  Hip flexion 3/5; sore 3+/5  Hip extension    Hip abduction    Hip adduction    Hip internal rotation    Hip external rotation    Knee flexion 4/5 4+/5  Knee extension 3+/5 4+/5  Ankle dorsiflexion 4/5 4/5  Ankle plantarflexion    Ankle inversion    Ankle eversion     (Blank rows = not tested)  FUNCTIONAL TESTS:  5 times sit to stand: 29.89 seconds without UE support Timed up and go (TUG): 16.95 seconds Sit to stand transfers: multiple attempts required Stand to sit transfers: Uncontrolled descent  GAIT: Assistive device utilized: None Level of assistance: Complete Independence Comments: Intermittent shuffling pattern   TODAY'S TREATMENT:                                                                                                                              DATE:                                    05/23/23 EXERCISE LOG  Exercise Repetitions and Resistance Comments  Nustep  L3 x 15 minutes   Rockerboard 5  mins   Marching on foam   BUE support   Side Stepping Airex x 3.5 mins Intermittent UE and minA for safety   Tandem Gait Airex x 3.5 mins BUE support   Standing hip ABD     Tandem stance    8 in toe tap    Seated hip ABD isometric     LAQ 4# x 25 reps bil Alternating LE  Seated marching  4# x 25 reps bil Limited by right thigh pain   4-5/10  Sit to stand  10 reps  no UE assist With UE support   Blank cell = exercise not performed today   PATIENT EDUCATION:  Education details: HEP, benefits of exercise, and expectation for soreness Person educated: Patient and Spouse Education method: Explanation Education comprehension: verbalized understanding  HOME EXERCISE PROGRAM: [id number]  ASSESSMENT:  CLINICAL IMPRESSION: Pt arrives for today's treatment session reporting 3/10 right LE pain with movement.  Pt able to tolerate increased time with standing balance exercises today with intermittent single UE support.  Pt able to  tolerate increased resistance with all seated exercises today.  Pt reported minimal increase in pain at completion of today's treatment session.  OBJECTIVE IMPAIRMENTS: Abnormal  gait, decreased balance, decreased mobility, difficulty walking, and decreased strength.   ACTIVITY LIMITATIONS: stairs and locomotion level  PARTICIPATION LIMITATIONS: meal prep, cleaning, laundry, shopping, and community activity  PERSONAL FACTORS: Age, Time since onset of injury/illness/exacerbation, and 3+ comorbidities: Hypertension, diabetes, diabetic neuropathy, chronic kidney disease, hearing loss, and arthritis  are also affecting patient's functional outcome.   REHAB POTENTIAL: Good  CLINICAL DECISION MAKING: Evolving/moderate complexity  EVALUATION COMPLEXITY: Moderate   GOALS: Goals reviewed with patient? Yes  LONG TERM GOALS: Target date: 06/04/23  Patient will be independent with his HEP. Baseline:  Goal status: INITIAL  2.  Patient will be able to recall the benefits of performing his HEP and exercising regularly. Baseline:  Goal status: INITIAL  3.  Patient will improve his timed up and go time to 12 seconds or less for reduced fall risk. Baseline:  Goal status: INITIAL  4.  Patient will improve his 5 times sit to stand time to 15 seconds or less for improved lower extremity power. Baseline:  Goal status: INITIAL  PLAN:  PT FREQUENCY: 2x/week  PT DURATION: 4 weeks  PLANNED INTERVENTIONS: 97164- PT Re-evaluation, 97110-Therapeutic exercises, 97530- Therapeutic activity, 97112- Neuromuscular re-education, 97535- Self Care, 66440- Manual therapy, [phone]- Gait training, Patient/Family education, Balance training, and Stair training  PLAN FOR NEXT SESSION: NuStep, gait training, lower extremity strengthening, balance interventions  Newman Pies, PTA 05/23/2023, 11:54 AM

## 2023-05-27 ENCOUNTER — Ambulatory Visit: Payer: Medicare PPO

## 2023-05-27 DIAGNOSIS — R2689 Other abnormalities of gait and mobility: Secondary | ICD-10-CM

## 2023-05-27 DIAGNOSIS — M6281 Muscle weakness (generalized): Secondary | ICD-10-CM

## 2023-05-27 NOTE — Therapy (Signed)
OUTPATIENT PHYSICAL THERAPY LOWER EXTREMITY TREATMENT   Patient Name: Joe [name] MRN: [id number] DOB:08-Jul-1940, 82 y.o., male Today's Date: 05/27/2023  END OF SESSION:  PT End of Session - 05/27/23 1030     Visit Number 6    Number of Visits 8    Date for PT Re-Evaluation 07/04/23    PT Start Time 1022    PT Stop Time 1102    PT Time Calculation (min) 40 min    Activity Tolerance Patient tolerated treatment well    Behavior During Therapy [facility] for tasks assessed/performed               Past Medical History:  Diagnosis Date   Adenomatous colon polyp    Arthritis    Cataract    Chest pain at rest 05/26/2016   Diabetes mellitus without complication (HCC)    Dyslipidemia    ESOPHAGITIS, REFLUX 07/09/2004   Qualifier: Diagnosis of  By: Vear Clock CMA (AAMA), Stephanie     Hiatal hernia    HOH (hard of hearing)    has bilateral Hearing aids   Hypertension    Irritable bowel syndrome    Kidney stone 1970   Pneumonia    Tick bites    took 5 rounds of Doxycycline this summer   Past Surgical History:  Procedure Laterality Date   COLONOSCOPY     ESOPHAGEAL DILATION  1996   ESOPHAGEAL DILATION  2003   FLEXIBLE SIGMOIDOSCOPY     HEMICOLECTOMY  08/2005   Right   KNEE SURGERY Right    TONSILLECTOMY     Patient Active Problem List   Diagnosis Date Noted   Sensorineural hearing loss (SNHL), bilateral 11/11/2022   Geographic tongue 07/11/2022   Neck sprain 02/11/2022   Gait instability 10/09/2021   Exposure to Agent Orange 04/04/2021   Seborrheic dermatitis, unspecified 04/04/2021   Presbycusis of both ears 05/09/2020   Chronic mycotic otitis externa 09/29/2019   Chronic left shoulder pain 08/13/2019   CKD stage 3 due to type 2 diabetes mellitus (HCC) 01/27/2019   Erectile dysfunction due to arterial insufficiency 04/15/2018   Thrombocytopenia (HCC) 03/18/2015   Atopic dermatitis 01/23/2015   Thoracic disc herniation 06/20/2014   Left varicocele 06/20/2014    Abdominal aortic atherosclerosis (HCC) 06/20/2014   Ventral hernia without obstruction or gangrene 02/04/2014   History of colonic polyps 02/04/2014   Hyperlipidemia associated with type 2 diabetes mellitus (HCC) 12/30/2012   Hypertension associated with diabetes (HCC) 12/30/2012   Benign prostatic hyperplasia 12/30/2012   Type 2 diabetes mellitus with diabetic neuropathy, with long-term current use of insulin (HCC) 09/28/2012   RBBB 04/11/2010   Cardiovascular function study, abnormal 04/11/2010   Gastroesophageal reflux disease without esophagitis 09/14/2007   Nephrolithiasis 09/14/2007   REFERRING PROVIDER: Sonny Masters, FNP   REFERRING DIAG: Unstable gait   THERAPY DIAG:  Other abnormalities of gait and mobility  Muscle weakness (generalized)  Rationale for Evaluation and Treatment: Rehabilitation  ONSET DATE: chronic  SUBJECTIVE:   SUBJECTIVE STATEMENT: Patient reports that he feels good today.   PERTINENT HISTORY: Hypertension, diabetes, diabetic neuropathy, chronic kidney disease, hearing loss, and arthritis PAIN:  Are you having pain? Yes: NPRS scale: 0/10 Pain location: right leg Pain description: sore  PRECAUTIONS: Fall  RED FLAGS: None   WEIGHT BEARING RESTRICTIONS: No  FALLS:  Has patient fallen in last 6 months? Yes. Number of falls 1  LIVING ENVIRONMENT: Lives with: lives with their spouse Lives in: House/apartment Stairs: Yes: Internal: 12  steps; on right going up and External: 2 steps; can reach both Has following equipment at home: None  OCCUPATION: retired  PLOF: Independent  PATIENT GOALS: improved stability, safety, and ease with transfers  NEXT MD VISIT: 07/22/22  OBJECTIVE:  Note: Objective measures were completed at Evaluation unless otherwise noted.  COGNITION: Overall cognitive status: Within functional limits for tasks assessed     SENSATION: Light touch: WFL Patient reports intermittent numbness and tingling in both  feet, but none reported currently.   EDEMA:  No edema observed  LOWER EXTREMITY ROM:  LOWER EXTREMITY MMT:  MMT Right eval Left eval  Hip flexion 3/5; sore 3+/5  Hip extension    Hip abduction    Hip adduction    Hip internal rotation    Hip external rotation    Knee flexion 4/5 4+/5  Knee extension 3+/5 4+/5  Ankle dorsiflexion 4/5 4/5  Ankle plantarflexion    Ankle inversion    Ankle eversion     (Blank rows = not tested)  FUNCTIONAL TESTS:  5 times sit to stand: 29.89 seconds without UE support Timed up and go (TUG): 16.95 seconds Sit to stand transfers: multiple attempts required Stand to sit transfers: Uncontrolled descent  GAIT: Assistive device utilized: None Level of assistance: Complete Independence Comments: Intermittent shuffling pattern   TODAY'S TREATMENT:                                                                                                                              DATE:                                     05/27/23 EXERCISE LOG  Exercise Repetitions and Resistance Comments  Nustep  L5 x 15 minutes   Rocker board  5 minutes  Intermittent UE support  Marching on foam  3 minutes  Intermittent UE support  Static stance on foam  2 minutes  Eyes closed  Standing cone taps  3 minutes Intermittent UE support   Cybex leg press 1 plate x 3.5 minutes @ seat 6    Blank cell = exercise not performed today                                    05/23/23 EXERCISE LOG  Exercise Repetitions and Resistance Comments  Nustep  L3 x 15 minutes   Rockerboard 5  mins   Marching on foam   BUE support   Side Stepping Airex x 3.5 mins Intermittent UE and minA for safety   Tandem Gait Airex x 3.5 mins BUE support   Standing hip ABD     Tandem stance    8 in toe tap    Seated hip ABD isometric     LAQ 4# x 25 reps bil Alternating LE  Seated marching  4# x 25 reps bil Limited by right thigh pain   4-5/10  Sit to stand  10 reps  no UE assist With UE support    Blank cell = exercise not performed today   PATIENT EDUCATION:  Education details: HEP, benefits of exercise, and expectation for soreness Person educated: Patient and Spouse Education method: Explanation Education comprehension: verbalized understanding  HOME EXERCISE PROGRAM: [phone]  ASSESSMENT:  CLINICAL IMPRESSION: Patient was introduced to new interventions for improved lower extremity stability and strength needed for functional activities such as transfers. He required minimal cueing with the leg press for improved eccentric control to facilitate increased muscular demand. He required close supervision with today's standing balance interventions for safety to reduce his reliance on the parallel bars. He reported feeling alright upon the conclusion of treatment. He continues to require skilled physical therapy to address his remaining impairments to maximize his safety and functional mobility.   OBJECTIVE IMPAIRMENTS: Abnormal gait, decreased balance, decreased mobility, difficulty walking, and decreased strength.   ACTIVITY LIMITATIONS: stairs and locomotion level  PARTICIPATION LIMITATIONS: meal prep, cleaning, laundry, shopping, and community activity  PERSONAL FACTORS: Age, Time since onset of injury/illness/exacerbation, and 3+ comorbidities: Hypertension, diabetes, diabetic neuropathy, chronic kidney disease, hearing loss, and arthritis  are also affecting patient's functional outcome.   REHAB POTENTIAL: Good  CLINICAL DECISION MAKING: Evolving/moderate complexity  EVALUATION COMPLEXITY: Moderate   GOALS: Goals reviewed with patient? Yes  LONG TERM GOALS: Target date: 06/04/23  Patient will be independent with his HEP. Baseline:  Goal status: INITIAL  2.  Patient will be able to recall the benefits of performing his HEP and exercising regularly. Baseline:  Goal status: INITIAL  3.  Patient will improve his timed up and go time to 12 seconds or less for  reduced fall risk. Baseline:  Goal status: INITIAL  4.  Patient will improve his 5 times sit to stand time to 15 seconds or less for improved lower extremity power. Baseline:  Goal status: INITIAL  PLAN:  PT FREQUENCY: 2x/week  PT DURATION: 4 weeks  PLANNED INTERVENTIONS: 97164- PT Re-evaluation, 97110-Therapeutic exercises, 97530- Therapeutic activity, 97112- Neuromuscular re-education, 97535- Self Care, 29528- Manual therapy, [phone]- Gait training, Patient/Family education, Balance training, and Stair training  PLAN FOR NEXT SESSION: NuStep, gait training, lower extremity strengthening, balance interventions  [place]anville Lewis, PT 05/27/2023, 12:18 PM

## 2023-05-30 ENCOUNTER — Ambulatory Visit: Payer: Medicare PPO

## 2023-05-30 DIAGNOSIS — R2689 Other abnormalities of gait and mobility: Secondary | ICD-10-CM

## 2023-05-30 DIAGNOSIS — M6281 Muscle weakness (generalized): Secondary | ICD-10-CM

## 2023-05-30 NOTE — Therapy (Signed)
OUTPATIENT PHYSICAL THERAPY LOWER EXTREMITY TREATMENT   Patient Name: Joe [name] MRN: [id number] DOB:[birth year], 82 y.o., male Today's Date: 05/30/2023  END OF SESSION:  PT End of Session - 05/30/23 1022     Visit Number 7    Number of Visits 8    Date for PT Re-Evaluation 07/04/23    PT Start Time 1021    PT Stop Time 1102    PT Time Calculation (min) 41 min    Activity Tolerance Patient tolerated treatment well    Behavior During Therapy [facility] for tasks assessed/performed               Past Medical History:  Diagnosis Date   Adenomatous colon polyp    Arthritis    Cataract    Chest pain at rest 05/26/2016   Diabetes mellitus without complication (HCC)    Dyslipidemia    ESOPHAGITIS, REFLUX 07/09/2004   Qualifier: Diagnosis of  By: Vear Clock CMA (AAMA), Stephanie     Hiatal hernia    HOH (hard of hearing)    has bilateral Hearing aids   Hypertension    Irritable bowel syndrome    Kidney stone 1970   Pneumonia    Tick bites    took 5 rounds of Doxycycline this summer   Past Surgical History:  Procedure Laterality Date   COLONOSCOPY     ESOPHAGEAL DILATION  1996   ESOPHAGEAL DILATION  2003   FLEXIBLE SIGMOIDOSCOPY     HEMICOLECTOMY  08/2005   Right   KNEE SURGERY Right    TONSILLECTOMY     Patient Active Problem List   Diagnosis Date Noted   Sensorineural hearing loss (SNHL), bilateral 11/11/2022   Geographic tongue 07/11/2022   Neck sprain 02/11/2022   Gait instability 10/09/2021   Exposure to Agent Orange 04/04/2021   Seborrheic dermatitis, unspecified 04/04/2021   Presbycusis of both ears 05/09/2020   Chronic mycotic otitis externa 09/29/2019   Chronic left shoulder pain 08/13/2019   CKD stage 3 due to type 2 diabetes mellitus (HCC) 01/27/2019   Erectile dysfunction due to arterial insufficiency 04/15/2018   Thrombocytopenia (HCC) 03/18/2015   Atopic dermatitis 01/23/2015   Thoracic disc herniation 06/20/2014   Left varicocele 06/20/2014    Abdominal aortic atherosclerosis (HCC) 06/20/2014   Ventral hernia without obstruction or gangrene 02/04/2014   History of colonic polyps 02/04/2014   Hyperlipidemia associated with type 2 diabetes mellitus (HCC) 12/30/2012   Hypertension associated with diabetes (HCC) 12/30/2012   Benign prostatic hyperplasia 12/30/2012   Type 2 diabetes mellitus with diabetic neuropathy, with long-term current use of insulin (HCC) 09/28/2012   RBBB 04/11/2010   Cardiovascular function study, abnormal 04/11/2010   Gastroesophageal reflux disease without esophagitis 09/14/2007   Nephrolithiasis 09/14/2007   REFERRING PROVIDER: Sonny Masters, FNP   REFERRING DIAG: Unstable gait   THERAPY DIAG:  Other abnormalities of gait and mobility  Muscle weakness (generalized)  Rationale for Evaluation and Treatment: Rehabilitation  ONSET DATE: chronic  SUBJECTIVE:   SUBJECTIVE STATEMENT: Patient reports that he feels good today.   PERTINENT HISTORY: Hypertension, diabetes, diabetic neuropathy, chronic kidney disease, hearing loss, and arthritis PAIN:  Are you having pain? Yes: NPRS scale: 0/10 Pain location: right leg Pain description: sore  PRECAUTIONS: Fall  RED FLAGS: None   WEIGHT BEARING RESTRICTIONS: No  FALLS:  Has patient fallen in last 6 months? Yes. Number of falls 1  LIVING ENVIRONMENT: Lives with: lives with their spouse Lives in: House/apartment Stairs: Yes: Internal: 12  steps; on right going up and External: 2 steps; can reach both Has following equipment at home: None  OCCUPATION: retired  PLOF: Independent  PATIENT GOALS: improved stability, safety, and ease with transfers  NEXT MD VISIT: 07/22/22  OBJECTIVE:  Note: Objective measures were completed at Evaluation unless otherwise noted.  COGNITION: Overall cognitive status: Within functional limits for tasks assessed     SENSATION: Light touch: WFL Patient reports intermittent numbness and tingling in both  feet, but none reported currently.   EDEMA:  No edema observed  LOWER EXTREMITY ROM:  LOWER EXTREMITY MMT:  MMT Right eval Left eval  Hip flexion 3/5; sore 3+/5  Hip extension    Hip abduction    Hip adduction    Hip internal rotation    Hip external rotation    Knee flexion 4/5 4+/5  Knee extension 3+/5 4+/5  Ankle dorsiflexion 4/5 4/5  Ankle plantarflexion    Ankle inversion    Ankle eversion     (Blank rows = not tested)  FUNCTIONAL TESTS:  5 times sit to stand: 29.89 seconds without UE support Timed up and go (TUG): 16.95 seconds Sit to stand transfers: multiple attempts required Stand to sit transfers: Uncontrolled descent  GAIT: Assistive device utilized: None Level of assistance: Complete Independence Comments: Intermittent shuffling pattern   TODAY'S TREATMENT:                                                                                                                              DATE:                                     05/30/23 EXERCISE LOG  Exercise Repetitions and Resistance Comments  Nustep  L5 x 20 minutes   Rocker board  5 minutes  Intermittent UE support  Tandem Gait 4 mins   Marching on foam  3.5 minutes  Intermittent UE support  Static stance on foam   Eyes closed  Standing cone taps   Intermittent UE support   Cybex leg press 1 plate x 4 minutes @ seat 6    Blank cell = exercise not performed today                                    05/23/23 EXERCISE LOG  Exercise Repetitions and Resistance Comments  Nustep  L3 x 15 minutes   Rockerboard 5  mins   Marching on foam   BUE support   Side Stepping Airex x 3.5 mins Intermittent UE and minA for safety   Tandem Gait Airex x 3.5 mins BUE support   Standing hip ABD     Tandem stance    8 in toe tap    Seated hip ABD isometric     LAQ 4# x 25 reps  bil Alternating LE  Seated marching  4# x 25 reps bil Limited by right thigh pain   4-5/10  Sit to stand  10 reps  no UE assist With UE  support   Blank cell = exercise not performed today   PATIENT EDUCATION:  Education details: HEP, benefits of exercise, and expectation for soreness Person educated: Patient and Spouse Education method: Explanation Education comprehension: verbalized understanding  HOME EXERCISE PROGRAM: [id number]  ASSESSMENT:  CLINICAL IMPRESSION: Pt arrives for today's treatment session denying any pain.  Pt able to tolerate increased time on Nustep today for endurance and activity tolerance without issue or complaint.  Pt also able to tolerate increased time on the leg press today without issue.  Pt expressed interest in attempting other cybex machinery at next session.  Pt denied any pain at completion of today's treatment session.   OBJECTIVE IMPAIRMENTS: Abnormal gait, decreased balance, decreased mobility, difficulty walking, and decreased strength.   ACTIVITY LIMITATIONS: stairs and locomotion level  PARTICIPATION LIMITATIONS: meal prep, cleaning, laundry, shopping, and community activity  PERSONAL FACTORS: Age, Time since onset of injury/illness/exacerbation, and 3+ comorbidities: Hypertension, diabetes, diabetic neuropathy, chronic kidney disease, hearing loss, and arthritis  are also affecting patient's functional outcome.   REHAB POTENTIAL: Good  CLINICAL DECISION MAKING: Evolving/moderate complexity  EVALUATION COMPLEXITY: Moderate   GOALS: Goals reviewed with patient? Yes  LONG TERM GOALS: Target date: 06/04/23  Patient will be independent with his HEP. Baseline:  Goal status: IN PROGRESS  2.  Patient will be able to recall the benefits of performing his HEP and exercising regularly. Baseline:  Goal status: IN PROGRESS  3.  Patient will improve his timed up and go time to 12 seconds or less for reduced fall risk. Baseline:  Goal status: IN PROGRESS  4.  Patient will improve his 5 times sit to stand time to 15 seconds or less for improved lower extremity power. Baseline:   Goal status: IN PROGRESS  PLAN:  PT FREQUENCY: 2x/week  PT DURATION: 4 weeks  PLANNED INTERVENTIONS: 97164- PT Re-evaluation, 97110-Therapeutic exercises, 97530- Therapeutic activity, 97112- Neuromuscular re-education, 97535- Self Care, [id number]- Manual therapy, [phone]- Gait training, Patient/Family education, Balance training, and Stair training  PLAN FOR NEXT SESSION: NuStep, gait training, lower extremity strengthening, balance interventions  Newman Pies, PTA 05/30/2023, 12:24 PM

## 2023-06-03 ENCOUNTER — Ambulatory Visit: Payer: Medicare PPO

## 2023-06-03 DIAGNOSIS — R2689 Other abnormalities of gait and mobility: Secondary | ICD-10-CM | POA: Diagnosis not present

## 2023-06-03 DIAGNOSIS — M6281 Muscle weakness (generalized): Secondary | ICD-10-CM

## 2023-06-03 NOTE — Therapy (Signed)
 OUTPATIENT PHYSICAL THERAPY LOWER EXTREMITY TREATMENT   Patient Name: Joe [name] MRN: [id number] DOB:1940-12-26, 82 y.o., male Today's Date: 06/03/2023  END OF SESSION:  PT End of Session - 06/03/23 1523     Visit Number 8    Number of Visits 8    Date for PT Re-Evaluation 07/04/23    PT Start Time 1518    PT Stop Time 1600    PT Time Calculation (min) 42 min    Activity Tolerance Patient tolerated treatment well    Behavior During Therapy [facility] for tasks assessed/performed                Past Medical History:  Diagnosis Date   Adenomatous colon polyp    Arthritis    Cataract    Chest pain at rest 05/26/2016   Diabetes mellitus without complication (HCC)    Dyslipidemia    ESOPHAGITIS, REFLUX 07/09/2004   Qualifier: Diagnosis of  By: [place] CMA (AAMA), Stephanie     Hiatal hernia    HOH (hard of hearing)    has bilateral Hearing aids   Hypertension    Irritable bowel syndrome    Kidney stone 1970   Pneumonia    Tick bites    took 5 rounds of Doxycycline  this summer   Past Surgical History:  Procedure Laterality Date   COLONOSCOPY     ESOPHAGEAL DILATION  1996   ESOPHAGEAL DILATION  2003   FLEXIBLE SIGMOIDOSCOPY     HEMICOLECTOMY  08/2005   Right   KNEE SURGERY Right    TONSILLECTOMY     Patient Active Problem List   Diagnosis Date Noted   Sensorineural hearing loss (SNHL), bilateral 11/11/2022   Geographic tongue 07/11/2022   Neck sprain 02/11/2022   Gait instability 10/09/2021   Exposure to Agent Orange 04/04/2021   Seborrheic dermatitis, unspecified 04/04/2021   Presbycusis of both ears 05/09/2020   Chronic mycotic otitis externa 09/29/2019   Chronic left shoulder pain 08/13/2019   CKD stage 3 due to type 2 diabetes mellitus (HCC) 01/27/2019   Erectile dysfunction due to arterial insufficiency 04/15/2018   Thrombocytopenia (HCC) 03/18/2015   Atopic dermatitis 01/23/2015   Thoracic disc herniation 06/20/2014   Left varicocele 06/20/2014    Abdominal aortic atherosclerosis (HCC) 06/20/2014   Ventral hernia without obstruction or gangrene 02/04/2014   History of colonic polyps 02/04/2014   Hyperlipidemia associated with type 2 diabetes mellitus (HCC) 12/30/2012   Hypertension associated with diabetes (HCC) 12/30/2012   Benign prostatic hyperplasia 12/30/2012   Type 2 diabetes mellitus with diabetic neuropathy, with long-term current use of insulin  (HCC) 09/28/2012   RBBB 04/11/2010   Cardiovascular function study, abnormal 04/11/2010   Gastroesophageal reflux disease without esophagitis 09/14/2007   Nephrolithiasis 09/14/2007   REFERRING PROVIDER: Severa Rock HERO, FNP   REFERRING DIAG: Unstable gait   THERAPY DIAG:  Other abnormalities of gait and mobility  Muscle weakness (generalized)  Rationale for Evaluation and Treatment: Rehabilitation  ONSET DATE: chronic  SUBJECTIVE:   SUBJECTIVE STATEMENT: Patient reports that he feels like he needs some new leg exercises as he is having trouble lifting his legs.   PERTINENT HISTORY: Hypertension, diabetes, diabetic neuropathy, chronic kidney disease, hearing loss, and arthritis PAIN:  Are you having pain? Yes: NPRS scale: 0/10 Pain location: right leg Pain description: sore  PRECAUTIONS: Fall  RED FLAGS: None   WEIGHT BEARING RESTRICTIONS: No  FALLS:  Has patient fallen in last 6 months? Yes. Number of falls 1  LIVING  ENVIRONMENT: Lives with: lives with their spouse Lives in: House/apartment Stairs: Yes: Internal: 12 steps; on right going up and External: 2 steps; can reach both Has following equipment at home: None  OCCUPATION: retired  PLOF: Independent  PATIENT GOALS: improved stability, safety, and ease with transfers  NEXT MD VISIT: 07/22/22  OBJECTIVE:  Note: Objective measures were completed at Evaluation unless otherwise noted.  COGNITION: Overall cognitive status: Within functional limits for tasks assessed     SENSATION: Light  touch: WFL Patient reports intermittent numbness and tingling in both feet, but none reported currently.   EDEMA:  No edema observed  LOWER EXTREMITY ROM:  LOWER EXTREMITY MMT:  MMT Right eval Left eval  Hip flexion 3/5; sore 3+/5  Hip extension    Hip abduction    Hip adduction    Hip internal rotation    Hip external rotation    Knee flexion 4/5 4+/5  Knee extension 3+/5 4+/5  Ankle dorsiflexion 4/5 4/5  Ankle plantarflexion    Ankle inversion    Ankle eversion     (Blank rows = not tested)  FUNCTIONAL TESTS:  5 times sit to stand: 29.89 seconds without UE support Timed up and go (TUG): 16.95 seconds Sit to stand transfers: multiple attempts required Stand to sit transfers: Uncontrolled descent  GAIT: Assistive device utilized: None Level of assistance: Complete Independence Comments: Intermittent shuffling pattern   TODAY'S TREATMENT:                                                                                                                              DATE:                                     06/03/23 EXERCISE LOG  Exercise Repetitions and Resistance Comments  Nustep  L4-5 x 20 minutes   LAQ 20 reps each    AA hip flexion (seated) 15 reps  RLE only   Seated hip ADD isometric  2 minutes w/ 5 second hold   Seated clams  Red t-band x 20 reps    Blank cell = exercise not performed today                                    05/30/23 EXERCISE LOG  Exercise Repetitions and Resistance Comments  Nustep  L5 x 20 minutes   Rocker board  5 minutes  Intermittent UE support  Tandem Gait 4 mins   Marching on foam  3.5 minutes  Intermittent UE support  Static stance on foam   Eyes closed  Standing cone taps   Intermittent UE support   Cybex leg press 1 plate x 4 minutes @ seat 6    Blank cell = exercise not performed today  05/23/23 EXERCISE LOG  Exercise Repetitions and Resistance Comments  Nustep  L3 x 15 minutes    Rockerboard 5  mins   Marching on foam   BUE support   Side Stepping Airex x 3.5 mins Intermittent UE and minA for safety   Tandem Gait Airex x 3.5 mins BUE support   Standing hip ABD     Tandem stance    8 in toe tap    Seated hip ABD isometric     LAQ 4# x 25 reps bil Alternating LE  Seated marching  4# x 25 reps bil Limited by right thigh pain   4-5/10  Sit to stand  10 reps  no UE assist With UE support   Blank cell = exercise not performed today   PATIENT EDUCATION:  Education details: HEP, benefits of exercise, and expectation for soreness Person educated: Patient and Spouse Education method: Explanation Education comprehension: verbalized understanding  HOME EXERCISE PROGRAM: [phone]  ASSESSMENT:  CLINICAL IMPRESSION: Patient has made minimal progress with skilled physical therapy as evidenced by his subjective reports, objective measures, and progress toward his goals. He was unable to demonstrate a significant improvement in his five time sit to stand time or his timed up and go times. His HEP was reviewed and updated, but he declined a handout of these exercise. He reported feeling comfortable being discharged at this time.   OBJECTIVE IMPAIRMENTS: Abnormal gait, decreased balance, decreased mobility, difficulty walking, and decreased strength.   ACTIVITY LIMITATIONS: stairs and locomotion level  PARTICIPATION LIMITATIONS: meal prep, cleaning, laundry, shopping, and community activity  PERSONAL FACTORS: Age, Time since onset of injury/illness/exacerbation, and 3+ comorbidities: Hypertension, diabetes, diabetic neuropathy, chronic kidney disease, hearing loss, and arthritis  are also affecting patient's functional outcome.   REHAB POTENTIAL: Good  CLINICAL DECISION MAKING: Evolving/moderate complexity  EVALUATION COMPLEXITY: Moderate   GOALS: Goals reviewed with patient? Yes  LONG TERM GOALS: Target date: 06/04/23  Patient will be independent with his  HEP. Baseline: Patient reports that he is doing his HEP every 2 days Goal status: PARTIALLY MET  2.  Patient will be able to recall the benefits of performing his HEP and exercising regularly. Baseline:  Goal status: MET  3.  Patient will improve his timed up and go time to 12 seconds or less for reduced fall risk. Baseline: 23.68 seconds  Goal status: NOT MET   4.  Patient will improve his 5 times sit to stand time to 15 seconds or less for improved lower extremity power. Baseline: 29.65 seconds with UE support from arm rests Goal status: NOT MET  PLAN:  PT FREQUENCY: 2x/week  PT DURATION: 4 weeks  PLANNED INTERVENTIONS: 97164- PT Re-evaluation, 97110-Therapeutic exercises, 97530- Therapeutic activity, 97112- Neuromuscular re-education, 97535- Self Care, [id number]- Manual therapy, [phone]- Gait training, Patient/Family education, Balance training, and Stair training  PLAN FOR NEXT SESSION: NuStep, gait training, lower extremity strengthening, balance interventions  Lacinda JAYSON Fass, PT 06/03/2023, 4:24 PM

## 2023-06-09 ENCOUNTER — Other Ambulatory Visit: Payer: Self-pay | Admitting: Family Medicine

## 2023-06-09 DIAGNOSIS — E1140 Type 2 diabetes mellitus with diabetic neuropathy, unspecified: Secondary | ICD-10-CM

## 2023-06-10 DIAGNOSIS — H903 Sensorineural hearing loss, bilateral: Secondary | ICD-10-CM | POA: Diagnosis not present

## 2023-06-16 ENCOUNTER — Other Ambulatory Visit: Payer: Self-pay | Admitting: Family Medicine

## 2023-06-16 DIAGNOSIS — E1140 Type 2 diabetes mellitus with diabetic neuropathy, unspecified: Secondary | ICD-10-CM

## 2023-06-18 ENCOUNTER — Ambulatory Visit: Payer: Medicare PPO | Admitting: Family Medicine

## 2023-06-18 ENCOUNTER — Encounter: Payer: Self-pay | Admitting: Family Medicine

## 2023-06-18 ENCOUNTER — Ambulatory Visit (INDEPENDENT_AMBULATORY_CARE_PROVIDER_SITE_OTHER): Payer: Medicare PPO

## 2023-06-18 VITALS — BP 151/67 | HR 72 | Temp 96.9°F | Ht 69.0 in | Wt 173.4 lb

## 2023-06-18 DIAGNOSIS — G8929 Other chronic pain: Secondary | ICD-10-CM

## 2023-06-18 DIAGNOSIS — M25511 Pain in right shoulder: Secondary | ICD-10-CM | POA: Diagnosis not present

## 2023-06-18 DIAGNOSIS — M79601 Pain in right arm: Secondary | ICD-10-CM

## 2023-06-18 DIAGNOSIS — M19011 Primary osteoarthritis, right shoulder: Secondary | ICD-10-CM | POA: Diagnosis not present

## 2023-06-18 DIAGNOSIS — M70811 Other soft tissue disorders related to use, overuse and pressure, right shoulder: Secondary | ICD-10-CM | POA: Diagnosis not present

## 2023-06-18 MED ORDER — PREDNISONE 20 MG PO TABS: 40.0000 mg | ORAL_TABLET | Freq: Every day | ORAL | 0 refills | Status: AC

## 2023-06-18 NOTE — Progress Notes (Signed)
 Subjective:  [name], male    DOB: 27-Dec-1940, 83 y.o.   MRN: [id number]  Patient Care Team: Galvin Jules, FNP as PCP - General (Family Medicine) Homero Luster, MD (Urology) Kenney Peacemaker, MD (Gastroenterology) Eilleen Grates, MD as Consulting Physician (Cardiology) Delilah Fend, [facility] (Pharmacist) Hyland Mailman, MD as Referring Physician (Optometry) Delilah Fend, [facility] as Pharmacist (Family Medicine)   Chief Complaint:  Arm Pain (Right arm pain after fall x 5 weeks ago )   HPI: Joe [name] is a 83 y.o. male presenting on 06/18/2023 for Arm Pain (Right arm pain after fall x 5 weeks ago )   Discussed the use of AI scribe software for clinical note transcription with the patient, who gave verbal consent to proceed.  History of Present Illness   The patient, with a history of gait instability, presented with a complaint of upper arm pain following a fall a few weeks ago. The fall occurred outside in the dark when the patient misjudged a step over an eight-inch wall, resulting in a knee injury and a laceration on the arm. The patient believes they may have extended their arm during the fall, which could have led to the current discomfort.  The pain is described as muscular, located from the elbow upwards, and is exacerbated by lifting objects or moving the arm. The patient has been attending physical therapy for gait issues but has not mentioned the arm pain during these sessions. They have not been taking any specific measures to alleviate the arm pain as they are unsure of what would help.  In addition to the arm pain, the patient has been experiencing difficulty raising their arm for a period longer than the five weeks since the fall. This limitation in movement has been a source of frustration for the patient. They have been using an under-desk elliptical machine for exercise, which they have been managing well.  The patient is open to attending physical  therapy specifically for the arm pain and is willing to undertake exercises for the suspected impingement syndrome.          Relevant past medical, surgical, family, and social history reviewed and updated as indicated.  Allergies and medications reviewed and updated. Data reviewed: Chart in Epic.   Past Medical History:  Diagnosis Date   Adenomatous colon polyp    Arthritis    Cataract    Chest pain at rest 05/26/2016   Diabetes mellitus without complication (HCC)    Dyslipidemia    ESOPHAGITIS, REFLUX 07/09/2004   Qualifier: Diagnosis of  By: Valda Garnet CMA Nonie Beady), Stephanie     Hiatal hernia    HOH (hard of hearing)    has bilateral Hearing aids   Hypertension    Irritable bowel syndrome    Kidney stone 1970   Pneumonia    Tick bites    took 5 rounds of Doxycycline  this summer    Past Surgical History:  Procedure Laterality Date   COLONOSCOPY     ESOPHAGEAL DILATION  1996   ESOPHAGEAL DILATION  2003   FLEXIBLE SIGMOIDOSCOPY     HEMICOLECTOMY  08/2005   Right   KNEE SURGERY Right    TONSILLECTOMY      Social History   Socioeconomic History   Marital status: Married    Spouse name: Sallyanne Creamer   Number of children: 1   Years of education: 16   Highest education level: Bachelor's degree (e.g., BA,  AB, BS)  Occupational History   Occupation: Retired Runner, broadcasting/film/video  Tobacco Use   Smoking status: Former    Current packs/day: 0.00    Average packs/day: 2.0 packs/day for 30.2 years (60.4 ttl pk-yrs)    Types: Cigarettes    Start date: 06/03/1954    Quit date: 08/24/1984    Years since quitting: 38.8   Smokeless tobacco: Never   Tobacco comments:    Has not used to tobacco products for the past 20 years  Vaping Use   Vaping status: Never Used  Substance and Sexual Activity   Alcohol use: Yes    Alcohol/week: 7.0 standard drinks of alcohol    Types: 7 Standard drinks or equivalent per week    Comment: [place]bon at night when he sits on the deck   Drug use: No   Sexual  activity: Yes    Partners: Female  Other Topics Concern   Not on file  Social History Narrative   Lives in [place] with wife, both retired teachers   1 son   3 caffeinated beverages daily   1 alcoholic beverage daily   Former smoker no current tobacco no drug use   Social Drivers of Corporate investment banker Strain: Low Risk  (04/04/2021)   Overall Financial Resource Strain (CARDIA)    Difficulty of Paying Living Expenses: Not hard at all  Food Insecurity: Low Risk  (11/11/2022)   Received from [facility]   Hunger Vital Sign    Worried About Running Out of Food in the Last Year: Never true    Ran Out of Food in the Last Year: Never true  Transportation Needs: Not on file (11/11/2022)  Physical Activity: Insufficiently Active (04/04/2021)   Exercise Vital Sign    Days of Exercise per Week: 3 days    Minutes of Exercise per Session: 20 min  Stress: No Stress Concern Present (04/04/2021)   [employer] of Occupational Health - Occupational Stress Questionnaire    Feeling of Stress : Not at all  Social Connections: Moderately Isolated (04/04/2021)   Social Connection and Isolation Panel [NHANES]    Frequency of Communication with Friends and Family: More than three times a week    Frequency of Social Gatherings with Friends and Family: Once a week    Attends Religious Services: Never    Database administrator or Organizations: No    Attends Banker Meetings: Never    Marital Status: Married  Catering manager Violence: Not At Risk (04/04/2021)   Humiliation, Afraid, Rape, and Kick questionnaire    Fear of Current or Ex-Partner: No    Emotionally Abused: No    Physically Abused: No    Sexually Abused: No    Outpatient Encounter Medications as of 06/18/2023  Medication Sig   Accu-Chek Softclix Lancets lancets CHECK BLOOD SUGAR 4 TIMES A DAY OR AS DIRECTED   aspirin  81 MG tablet Take 81 mg by mouth daily.   Blood Glucose Monitoring Suppl w/Device KIT Test BS  BID and PRN dx E11.9. Give test strips and lancets to match machine given   cholecalciferol (VITAMIN D ) 1000 UNITS tablet Take 2,000 Units by mouth daily.   Cinnamon 500 MG TABS Take 1,000 mg by mouth in the morning and at bedtime.   Continuous Glucose Sensor (FREESTYLE LIBRE 2 SENSOR) MISC USE AS DIRECTED UP TO 6 TIMES DAILY, CHANGE EVERY 14 DAYS   doxylamine, Sleep, (UNISOM) 25 MG tablet Take 25 mg by mouth at bedtime as  needed for sleep.    esomeprazole  (NEXIUM ) 40 MG capsule 1 capsule daily   glucose blood (ACCU-CHEK GUIDE) test strip Check blood sugar 4 times daily Dx E 11.40   insulin  aspart (NOVOLOG  FLEXPEN) 100 UNIT/ML FlexPen Inject 10-15 units in the morning before breakfast and before dinner as directed   Insulin  Pen Needle (PEN NEEDLES) 32G X 5 MM MISC 1 Device by Does not apply route daily.   JANUVIA  100 MG tablet TAKE ONE TABLET ONCE DAILY   JARDIANCE  25 MG TABS tablet TAKE ONE TABLET EVERY MORNING   MELATONIN PO Take by mouth.   metFORMIN  (GLUCOPHAGE -XR) 500 MG 24 hr tablet TAKE TWO TABLETS TWICE DAILY WITH MEAL(S)   Multiple Vitamin (MULTI VITAMIN) TABS 1 tablet Orally Once a day for 30 day(s)   olmesartan  (BENICAR ) 20 MG tablet TAKE ONE TABLET DAILY   predniSONE  (DELTASONE ) 20 MG tablet Take 2 tablets (40 mg total) by mouth daily with breakfast for 5 days.   Probiotic Product (PROBIOTIC PO) Take 1 tablet by mouth daily.   simvastatin  (ZOCOR ) 40 MG tablet TAKE ONE TABLET DAILY EVERY EVENING   TRESIBA  FLEXTOUCH 100 UNIT/ML FlexTouch Pen Inject 6-20 Units into the skin daily at 10 pm.   vitamin B-12 (CYANOCOBALAMIN ) 100 MCG tablet Take 100 mcg by mouth daily.   No facility-administered encounter medications on file as of 06/18/2023.    Allergies  Allergen Reactions   Ace Inhibitors Cough   Trazodone Other (See Comments)   Trazamine [Trazodone & Diet Manage Prod] Other (See Comments)    nightmares    Pertinent ROS per HPI, otherwise unremarkable      Objective:  BP  (!) 151/67   Pulse 72   Temp (!) 96.9 F (36.1 C)   Ht 5\' 9"  (1.753 m)   Wt 173 lb 6.4 oz (78.7 kg)   SpO2 96%   BMI 25.61 kg/m    Wt Readings from Last 3 Encounters:  06/18/23 173 lb 6.4 oz (78.7 kg)  04/30/23 171 lb 8 oz (77.8 kg)  01/14/23 170 lb 8 oz (77.3 kg)    Physical Exam Vitals and nursing note reviewed.  Constitutional:      Appearance: Normal appearance. He is normal weight.  HENT:     Head: Normocephalic and atraumatic.     Right Ear: Decreased hearing noted.     Left Ear: Decreased hearing noted.     Ears:     Comments: Bilateral hearing aids    Nose: Nose normal.     Mouth/Throat:     Mouth: Mucous membranes are moist.     Pharynx: Oropharynx is clear.  Eyes:     Conjunctiva/sclera: Conjunctivae normal.     Pupils: Pupils are equal, round, and reactive to light.  Cardiovascular:     Rate and Rhythm: Normal rate and regular rhythm.     Pulses: Normal pulses.     Heart sounds: Normal heart sounds.  Pulmonary:     Effort: Pulmonary effort is normal.     Breath sounds: Normal breath sounds.  Musculoskeletal:     Right shoulder: Tenderness present. No swelling, deformity, effusion, laceration, bony tenderness or crepitus. Decreased range of motion. Normal strength. Normal pulse.     Right upper arm: Normal.     Cervical back: Normal range of motion and neck supple. No rigidity or tenderness.     Comments: RUE: negative empty can, positive Hawkins and Neer signs.   Skin:    General: Skin is warm and  dry.     Capillary Refill: Capillary refill takes less than 2 seconds.  Neurological:     General: No focal deficit present.     Mental Status: He is alert and oriented to person, place, and time.  Psychiatric:        Mood and Affect: Mood normal.        Behavior: Behavior normal.        Thought Content: Thought content normal.        Judgment: Judgment normal.      Results for orders placed or performed in visit on 04/30/23  Bayer DCA Hb A1c Waived    Collection Time: 04/30/23 10:43 AM  Result Value Ref Range   HB A1C (BAYER DCA - WAIVED) 8.3 (H) 4.8 - 5.6 %     X-Ray: right shoulder: Degenerative changes. No acute findings. Preliminary x-ray reading by Kattie Parrot, FNP-C, [facility].   Pertinent labs & imaging results that were available during my care of the patient were reviewed by me and considered in my medical decision making.  Assessment & Plan:  [place] was seen today for arm pain.  Diagnoses and all orders for this visit:  Right arm pain -     DG Shoulder Right -     predniSONE  (DELTASONE ) 20 MG tablet; Take 2 tablets (40 mg total) by mouth daily with breakfast for 5 days. -     Ambulatory referral to Physical Therapy  Chronic right shoulder pain -     DG Shoulder Right -     predniSONE  (DELTASONE ) 20 MG tablet; Take 2 tablets (40 mg total) by mouth daily with breakfast for 5 days. -     Ambulatory referral to Physical Therapy     Assessment and Plan    Shoulder Pain - Suspected Impingement Syndrome Reports shoulder pain from the elbow up, primarily when lifting or moving the arm. Pain ongoing for over five weeks following a fall. Suspected impingement syndrome due to mechanism of injury and symptoms. Able to move the arm, indicating it is not frozen, but pain suggests possible rotator cuff involvement. Patient prefers non-surgical options due to age and concerns about surgery. - Order shoulder x-ray - Prescribe prednisone  burst due to kidney function - Refer to physical therapy for specific rotator cuff exercises - Recommend anti-inflammatory medications and exercises  General Health Maintenance Ongoing therapy for gait issues and using an under-desk elliptical. Enjoys physical therapy and willing to continue. - Continue physical therapy for gait and arm issues - Encourage use of under-desk elliptical for exercise  Follow-up - Schedule follow-up appointment in February.          Continue all other maintenance  medications.  Follow up plan: Return if symptoms worsen or fail to improve.   Continue healthy lifestyle choices, including diet (rich in fruits, vegetables, and lean proteins, and low in salt and simple carbohydrates) and exercise (at least 30 minutes of moderate physical activity daily).   The above assessment and management plan was discussed with the patient. The patient verbalized understanding of and has agreed to the management plan. Patient is aware to call the clinic if they develop any new symptoms or if symptoms persist or worsen. Patient is aware when to return to the clinic for a follow-up visit. Patient educated on when it is appropriate to go to the emergency department.   Kattie Parrot, FNP-C [facility] [phone]

## 2023-06-25 ENCOUNTER — Other Ambulatory Visit: Payer: Self-pay

## 2023-06-25 ENCOUNTER — Encounter: Payer: Self-pay | Admitting: Physical Therapy

## 2023-06-25 ENCOUNTER — Ambulatory Visit: Payer: Medicare PPO | Attending: Family Medicine | Admitting: Physical Therapy

## 2023-06-25 DIAGNOSIS — G8929 Other chronic pain: Secondary | ICD-10-CM | POA: Insufficient documentation

## 2023-06-25 DIAGNOSIS — M79601 Pain in right arm: Secondary | ICD-10-CM | POA: Diagnosis not present

## 2023-06-25 DIAGNOSIS — M25512 Pain in left shoulder: Secondary | ICD-10-CM | POA: Insufficient documentation

## 2023-06-25 DIAGNOSIS — M25511 Pain in right shoulder: Secondary | ICD-10-CM | POA: Insufficient documentation

## 2023-06-25 DIAGNOSIS — M6281 Muscle weakness (generalized): Secondary | ICD-10-CM | POA: Diagnosis not present

## 2023-06-25 DIAGNOSIS — M25612 Stiffness of left shoulder, not elsewhere classified: Secondary | ICD-10-CM | POA: Diagnosis not present

## 2023-06-25 NOTE — Therapy (Signed)
OUTPATIENT PHYSICAL THERAPY SHOULDER EVALUATION   Patient Name: Joe [name] MRN: [id number] DOB:03-Oct-1940, 83 y.o., male Today's Date: 06/25/2023  END OF SESSION:  PT End of Session - 06/25/23 1131     Visit Number 1    Number of Visits 12    Date for PT Re-Evaluation 08/06/23    PT Start Time 1011    PT Stop Time 1103    PT Time Calculation (min) 52 min             Past Medical History:  Diagnosis Date   Adenomatous colon polyp    Arthritis    Cataract    Chest pain at rest 05/26/2016   Diabetes mellitus without complication (HCC)    Dyslipidemia    ESOPHAGITIS, REFLUX 07/09/2004   Qualifier: Diagnosis of  By: Vear Clock CMA Duncan Dull), Stephanie     Hiatal hernia    HOH (hard of hearing)    has bilateral Hearing aids   Hypertension    Irritable bowel syndrome    Kidney stone 1970   Pneumonia    Tick bites    took 5 rounds of Doxycycline this summer   Past Surgical History:  Procedure Laterality Date   COLONOSCOPY     ESOPHAGEAL DILATION  1996   ESOPHAGEAL DILATION  2003   FLEXIBLE SIGMOIDOSCOPY     HEMICOLECTOMY  08/2005   Right   KNEE SURGERY Right    TONSILLECTOMY     Patient Active Problem List   Diagnosis Date Noted   Sensorineural hearing loss (SNHL), bilateral 11/11/2022   Geographic tongue 07/11/2022   Neck sprain 02/11/2022   Gait instability 10/09/2021   Exposure to Agent Orange 04/04/2021   Seborrheic dermatitis, unspecified 04/04/2021   Presbycusis of both ears 05/09/2020   Chronic mycotic otitis externa 09/29/2019   Chronic left shoulder pain 08/13/2019   CKD stage 3 due to type 2 diabetes mellitus (HCC) 01/27/2019   Erectile dysfunction due to arterial insufficiency 04/15/2018   Thrombocytopenia (HCC) 03/18/2015   Atopic dermatitis 01/23/2015   Thoracic disc herniation 06/20/2014   Left varicocele 06/20/2014   Abdominal aortic atherosclerosis (HCC) 06/20/2014   Ventral hernia without obstruction or gangrene 02/04/2014   History of  colonic polyps 02/04/2014   Hyperlipidemia associated with type 2 diabetes mellitus (HCC) 12/30/2012   Hypertension associated with diabetes (HCC) 12/30/2012   Benign prostatic hyperplasia 12/30/2012   Type 2 diabetes mellitus with diabetic neuropathy, with long-term current use of insulin (HCC) 09/28/2012   RBBB 04/11/2010   Cardiovascular function study, abnormal 04/11/2010   Gastroesophageal reflux disease without esophagitis 09/14/2007   Nephrolithiasis 09/14/2007   REFERRING PROVIDER: Gilford Silvius  REFERRING DIAG: Chronic right shoulder pain.  THERAPY DIAG:  Acute pain of left shoulder - Plan: PT plan of care cert/re-cert  Stiffness of left shoulder, not elsewhere classified - Plan: PT plan of care cert/re-cert  Muscle weakness (generalized) - Plan: PT plan of care cert/re-cert  Rationale for Evaluation and Treatment: Rehabilitation  ONSET DATE: ~6 weeks.  SUBJECTIVE:  SUBJECTIVE STATEMENT: The patient presents to the clinic with c/o right shoulder pain that has been ongoing for about 6 weeks due to a fall in which he was outside after dark without a flashlight and tripped as he was trying to get over an eight inch step.  He finished a regimen of Prednisone which was helpful.  His resting pain-level is a 4 /10 and much higher with movement of his right shoulder out to the side and behind back.    PERTINENT HISTORY: See above.  PAIN:  Are you having pain? Yes: NPRS scale: 4/10. Pain location: Right shoulder. Pain description: Sore, sharp. Aggravating factors: Movement of right shoulder. Relieving factors: Rest.    PRECAUTIONS: Fall.  Please supervise gait.   WEIGHT BEARING RESTRICTIONS: No  FALLS:  Has patient fallen in last 6 months? Yes. Number of falls    LIVING ENVIRONMENT: Lives  with: lives with their spouse Lives in: House/apartment  PLOF: Independent  PATIENT GOALS:Use right shoulder without pain.  NEXT MD VISIT:   OBJECTIVE:  PATIENT SURVEYS:  FOTO 56.  POSTURE: Forward head and rounded shoulder.  UPPER EXTREMITY ROM:   Active right shoulder flexion to 120 degrees and passive to 130 degrees.  Active left shoulder flexion to 145 degrees.  Full active right shoulder ER.  Behind back limited to right SIJ region.    UPPER EXTREMITY MMT:  Right shoulder IR/ER tested with right elbow by side is 4 to 4+/5. Abduction 3- to 3/5 limited likely in part due to pain.  SHOULDER SPECIAL TESTS: Positive right shoulder Impingement testing. (-) Drop Arm test. Normal UE DTR's.  PALPATION:  No specific areas of right shoulder tenderness though he experiences pain into his right biceps region.                                                                                                                                TREATMENT DATE: 06/25/23:   HMP and IFC at 80-150 Hz on 40% scan x 20 minutes.  Normal modality response following removal of modality.    PATIENT EDUCATION: Education details:  Person educated:  International aid/development worker:  Education comprehension:   HOME EXERCISE PROGRAM:   ASSESSMENT:  CLINICAL IMPRESSION: The patient presents to OPPT with c/o right shoulder due to a fall about 6 weeks ago.  He has a loss of motion and pain increase with certain.  He demonstrates positive right shoulder Impingement testing.  He has weakness into abduction though this could be due at least in part due to pain.  His FOTO limitation score is 56.  Patient will benefit from skilled physical therapy intervention to address pain and deficits.  OBJECTIVE IMPAIRMENTS: decreased activity tolerance, decreased ROM, decreased strength, and pain.   ACTIVITY LIMITATIONS: carrying, lifting, and reach over head  PARTICIPATION LIMITATIONS: cleaning  REHAB POTENTIAL:  Good  CLINICAL DECISION MAKING: Stable/uncomplicated  EVALUATION COMPLEXITY: Low   GOALS:   LONG TERM GOALS:  Target date: 08/07/23.  Ind with a HEP.  Goal status: INITIAL  2.  Active right shoulder flexion to 135-140 degrees.  Goal status: INITIAL  3.  Increase right shoulder strength to a solid 4+ to 5/5 to increase stability for performance of functional activities.  Goal status: INITIAL  4.  Perform ADL's with right shoulder pain not > 3/10.  Goal status: INITIAL  PLAN:  PT FREQUENCY: 2x/week  PT DURATION: 6 weeks  PLANNED INTERVENTIONS: 97110-Therapeutic exercises, 97530- Therapeutic activity, 97112- Neuromuscular re-education, 97535- Self Care, 56387- Manual therapy, 97014- Electrical stimulation (unattended), 97016- Vasopneumatic device, 97035- Ultrasound, Patient/Family education, Dry Needling, Cryotherapy, and Moist heat  PLAN FOR NEXT SESSION: Modalities and STW/M, UE Ranger, wall ladder, pulleys (no pain increase), RW4, PRE's.     Pami Wool, [place], PT 06/25/2023, 12:16 PM

## 2023-06-30 DIAGNOSIS — Z794 Long term (current) use of insulin: Secondary | ICD-10-CM | POA: Diagnosis not present

## 2023-06-30 DIAGNOSIS — H6123 Impacted cerumen, bilateral: Secondary | ICD-10-CM | POA: Diagnosis not present

## 2023-06-30 DIAGNOSIS — Z8669 Personal history of other diseases of the nervous system and sense organs: Secondary | ICD-10-CM | POA: Diagnosis not present

## 2023-07-01 ENCOUNTER — Ambulatory Visit: Payer: Medicare PPO

## 2023-07-01 DIAGNOSIS — G8929 Other chronic pain: Secondary | ICD-10-CM | POA: Diagnosis not present

## 2023-07-01 DIAGNOSIS — M25512 Pain in left shoulder: Secondary | ICD-10-CM | POA: Diagnosis not present

## 2023-07-01 DIAGNOSIS — M79601 Pain in right arm: Secondary | ICD-10-CM | POA: Diagnosis not present

## 2023-07-01 DIAGNOSIS — M6281 Muscle weakness (generalized): Secondary | ICD-10-CM | POA: Diagnosis not present

## 2023-07-01 DIAGNOSIS — M25612 Stiffness of left shoulder, not elsewhere classified: Secondary | ICD-10-CM | POA: Diagnosis not present

## 2023-07-01 DIAGNOSIS — M25511 Pain in right shoulder: Secondary | ICD-10-CM | POA: Diagnosis not present

## 2023-07-01 NOTE — Therapy (Signed)
OUTPATIENT PHYSICAL THERAPY SHOULDER TREATMENT   Patient Name: Joe [name] MRN: [id number] DOB:18-Dec-1940, 83 y.o., male Today's Date: 07/01/2023  END OF SESSION:  PT End of Session - 07/01/23 1020     Visit Number 2    Number of Visits 12    Date for PT Re-Evaluation 08/06/23    PT Start Time 1015    PT Stop Time 1102    PT Time Calculation (min) 47 min             Past Medical History:  Diagnosis Date   Adenomatous colon polyp    Arthritis    Cataract    Chest pain at rest 05/26/2016   Diabetes mellitus without complication (HCC)    Dyslipidemia    ESOPHAGITIS, REFLUX 07/09/2004   Qualifier: Diagnosis of  By: Vear Clock CMA Duncan Dull), Stephanie     Hiatal hernia    HOH (hard of hearing)    has bilateral Hearing aids   Hypertension    Irritable bowel syndrome    Kidney stone 1970   Pneumonia    Tick bites    took 5 rounds of Doxycycline this summer   Past Surgical History:  Procedure Laterality Date   COLONOSCOPY     ESOPHAGEAL DILATION  1996   ESOPHAGEAL DILATION  2003   FLEXIBLE SIGMOIDOSCOPY     HEMICOLECTOMY  08/2005   Right   KNEE SURGERY Right    TONSILLECTOMY     Patient Active Problem List   Diagnosis Date Noted   Sensorineural hearing loss (SNHL), bilateral 11/11/2022   Geographic tongue 07/11/2022   Neck sprain 02/11/2022   Gait instability 10/09/2021   Exposure to Agent Orange 04/04/2021   Seborrheic dermatitis, unspecified 04/04/2021   Presbycusis of both ears 05/09/2020   Chronic mycotic otitis externa 09/29/2019   Chronic left shoulder pain 08/13/2019   CKD stage 3 due to type 2 diabetes mellitus (HCC) 01/27/2019   Erectile dysfunction due to arterial insufficiency 04/15/2018   Thrombocytopenia (HCC) 03/18/2015   Atopic dermatitis 01/23/2015   Thoracic disc herniation 06/20/2014   Left varicocele 06/20/2014   Abdominal aortic atherosclerosis (HCC) 06/20/2014   Ventral hernia without obstruction or gangrene 02/04/2014   History of  colonic polyps 02/04/2014   Hyperlipidemia associated with type 2 diabetes mellitus (HCC) 12/30/2012   Hypertension associated with diabetes (HCC) 12/30/2012   Benign prostatic hyperplasia 12/30/2012   Type 2 diabetes mellitus with diabetic neuropathy, with long-term current use of insulin (HCC) 09/28/2012   RBBB 04/11/2010   Cardiovascular function study, abnormal 04/11/2010   Gastroesophageal reflux disease without esophagitis 09/14/2007   Nephrolithiasis 09/14/2007   REFERRING PROVIDER: Gilford Silvius  REFERRING DIAG: Chronic right shoulder pain.  THERAPY DIAG:  Acute pain of left shoulder  Stiffness of left shoulder, not elsewhere classified  Rationale for Evaluation and Treatment: Rehabilitation  ONSET DATE: ~6 weeks.  SUBJECTIVE:  SUBJECTIVE STATEMENT: Pt reports 6-7/10 right shoulder pain today.    PERTINENT HISTORY: See above.  PAIN:  Are you having pain? Yes: NPRS scale: 6-7/10. Pain location: Right shoulder. Pain description: Sore, sharp. Aggravating factors: Movement of right shoulder. Relieving factors: Rest.    PRECAUTIONS: Fall.  Please supervise gait.   WEIGHT BEARING RESTRICTIONS: No  FALLS:  Has patient fallen in last 6 months? Yes. Number of falls    LIVING ENVIRONMENT: Lives with: lives with their spouse Lives in: House/apartment  PLOF: Independent  PATIENT GOALS:Use right shoulder without pain.  NEXT MD VISIT:   OBJECTIVE:  PATIENT SURVEYS:  FOTO 56.  POSTURE: Forward head and rounded shoulder.  UPPER EXTREMITY ROM:   Active right shoulder flexion to 120 degrees and passive to 130 degrees.  Active left shoulder flexion to 145 degrees.  Full active right shoulder ER.  Behind back limited to right SIJ region.    UPPER EXTREMITY MMT:  Right shoulder IR/ER  tested with right elbow by side is 4 to 4+/5. Abduction 3- to 3/5 limited likely in part due to pain.  SHOULDER SPECIAL TESTS: Positive right shoulder Impingement testing. (-) Drop Arm test. Normal UE DTR's.  PALPATION:  No specific areas of right shoulder tenderness though he experiences pain into his right biceps region.                                                                                                                                TREATMENT DATE:                 07/01/23                     EXERCISE LOG  Exercise Repetitions and Resistance Comments  Pulleys 4 mins   Ranger Flex/ext; CW and CCW circles x 2 mins   Wall Ladder 5 reps (max# 25)   Rows Yellow x 20 reps bil   Extension Yellow x 20 reps bil   Horizontal Abduction Yellow x 20 reps bil    Blank cell = exercise not performed today   Manual Therapy Soft Tissue Mobilization: right shoulder, STW/M to right deltoid and bicep to decrease pain and tone     06/25/23:   HMP and IFC at 80-150 Hz on 40% scan x 20 minutes.  Normal modality response following removal of modality.    PATIENT EDUCATION: Education details:  Person educated:  International aid/development worker:  Education comprehension:   HOME EXERCISE PROGRAM:   ASSESSMENT:  CLINICAL IMPRESSION: Pt arrives for today's treatment session reporting 6-7/10 right shoulder pain.  Pt introduced PROM/AAROM exercises today to increase ROM and function.  Pt requiring min cues for proper technique and to avoid painful ROM.  Pt able to reach max number of 25 today on wall ladder.  Pt requiring min cues for proper technique with all t-band exercises.  STW/M performed to right deltoid and bicep to decrease  pain and tone.  Pt educated on use of personal TENS unit with emphasis on electrode placement and contraindications.  Pt left with personal TENS unit running.  Pt reported decreased right shoulder pain at completion of today's treatment session.  OBJECTIVE IMPAIRMENTS:  decreased activity tolerance, decreased ROM, decreased strength, and pain.   ACTIVITY LIMITATIONS: carrying, lifting, and reach over head  PARTICIPATION LIMITATIONS: cleaning  REHAB POTENTIAL: Good  CLINICAL DECISION MAKING: Stable/uncomplicated  EVALUATION COMPLEXITY: Low   GOALS:   LONG TERM GOALS: Target date: 08/07/23.  Ind with a HEP.  Goal status: INITIAL  2.  Active right shoulder flexion to 135-140 degrees.  Goal status: INITIAL  3.  Increase right shoulder strength to a solid 4+ to 5/5 to increase stability for performance of functional activities.  Goal status: INITIAL  4.  Perform ADL's with right shoulder pain not > 3/10.  Goal status: INITIAL  PLAN:  PT FREQUENCY: 2x/week  PT DURATION: 6 weeks  PLANNED INTERVENTIONS: 97110-Therapeutic exercises, 97530- Therapeutic activity, 97112- Neuromuscular re-education, 97535- Self Care, 16109- Manual therapy, 97014- Electrical stimulation (unattended), 97016- Vasopneumatic device, 97035- Ultrasound, Patient/Family education, Dry Needling, Cryotherapy, and Moist heat  PLAN FOR NEXT SESSION: Modalities and STW/M, UE Ranger, wall ladder, pulleys (no pain increase), RW4, PRE's.     Newman Pies, PTA 07/01/2023, 11:09 AM

## 2023-07-03 ENCOUNTER — Ambulatory Visit: Payer: Medicare PPO

## 2023-07-03 DIAGNOSIS — M6281 Muscle weakness (generalized): Secondary | ICD-10-CM

## 2023-07-03 DIAGNOSIS — M25512 Pain in left shoulder: Secondary | ICD-10-CM

## 2023-07-03 DIAGNOSIS — M25612 Stiffness of left shoulder, not elsewhere classified: Secondary | ICD-10-CM | POA: Diagnosis not present

## 2023-07-03 DIAGNOSIS — M25511 Pain in right shoulder: Secondary | ICD-10-CM | POA: Diagnosis not present

## 2023-07-03 DIAGNOSIS — G8929 Other chronic pain: Secondary | ICD-10-CM | POA: Diagnosis not present

## 2023-07-03 DIAGNOSIS — M79601 Pain in right arm: Secondary | ICD-10-CM | POA: Diagnosis not present

## 2023-07-03 NOTE — Therapy (Signed)
OUTPATIENT PHYSICAL THERAPY SHOULDER TREATMENT   Patient Name: Joe [name] MRN: [id number] DOB:[birth year], 83 y.o., male Today's Date: 07/03/2023  END OF SESSION:  PT End of Session - 07/03/23 1018     Visit Number 3    Number of Visits 12    Date for PT Re-Evaluation 08/06/23    PT Start Time 1015    PT Stop Time 1055    PT Time Calculation (min) 40 min    Activity Tolerance Patient tolerated treatment well    Behavior During Therapy [facility] for tasks assessed/performed              Past Medical History:  Diagnosis Date   Adenomatous colon polyp    Arthritis    Cataract    Chest pain at rest 05/26/2016   Diabetes mellitus without complication (HCC)    Dyslipidemia    ESOPHAGITIS, REFLUX 07/09/2004   Qualifier: Diagnosis of  By: Vear Clock CMA (AAMA), Stephanie     Hiatal hernia    HOH (hard of hearing)    has bilateral Hearing aids   Hypertension    Irritable bowel syndrome    Kidney stone 1970   Pneumonia    Tick bites    took 5 rounds of Doxycycline this summer   Past Surgical History:  Procedure Laterality Date   COLONOSCOPY     ESOPHAGEAL DILATION  1996   ESOPHAGEAL DILATION  2003   FLEXIBLE SIGMOIDOSCOPY     HEMICOLECTOMY  08/2005   Right   KNEE SURGERY Right    TONSILLECTOMY     Patient Active Problem List   Diagnosis Date Noted   Sensorineural hearing loss (SNHL), bilateral 11/11/2022   Geographic tongue 07/11/2022   Neck sprain 02/11/2022   Gait instability 10/09/2021   Exposure to Agent Orange 04/04/2021   Seborrheic dermatitis, unspecified 04/04/2021   Presbycusis of both ears 05/09/2020   Chronic mycotic otitis externa 09/29/2019   Chronic left shoulder pain 08/13/2019   CKD stage 3 due to type 2 diabetes mellitus (HCC) 01/27/2019   Erectile dysfunction due to arterial insufficiency 04/15/2018   Thrombocytopenia (HCC) 03/18/2015   Atopic dermatitis 01/23/2015   Thoracic disc herniation 06/20/2014   Left varicocele 06/20/2014   Abdominal  aortic atherosclerosis (HCC) 06/20/2014   Ventral hernia without obstruction or gangrene 02/04/2014   History of colonic polyps 02/04/2014   Hyperlipidemia associated with type 2 diabetes mellitus (HCC) 12/30/2012   Hypertension associated with diabetes (HCC) 12/30/2012   Benign prostatic hyperplasia 12/30/2012   Type 2 diabetes mellitus with diabetic neuropathy, with long-term current use of insulin (HCC) 09/28/2012   RBBB 04/11/2010   Cardiovascular function study, abnormal 04/11/2010   Gastroesophageal reflux disease without esophagitis 09/14/2007   Nephrolithiasis 09/14/2007   REFERRING PROVIDER: Gilford Silvius  REFERRING DIAG: Chronic right shoulder pain.  THERAPY DIAG:  Acute pain of left shoulder  Stiffness of left shoulder, not elsewhere classified  Muscle weakness (generalized)  Rationale for Evaluation and Treatment: Rehabilitation  ONSET DATE: ~6 weeks.  SUBJECTIVE:  SUBJECTIVE STATEMENT: Patient reports that his shoulder still hurts when he tries to raise it. However, it is not the worst pain ever, but it is "pretty bad."   PERTINENT HISTORY: See above.  PAIN:  Are you having pain? Yes: NPRS scale: "bad" Pain location: Right shoulder. Pain description: Sore, sharp. Aggravating factors: Movement of right shoulder. Relieving factors: Rest.    PRECAUTIONS: Fall.  Please supervise gait.   WEIGHT BEARING RESTRICTIONS: No  FALLS:  Has patient fallen in last 6 months? Yes. Number of falls    LIVING ENVIRONMENT: Lives with: lives with their spouse Lives in: House/apartment  PLOF: Independent  PATIENT GOALS:Use right shoulder without pain.  NEXT MD VISIT:   OBJECTIVE:  PATIENT SURVEYS:  FOTO 56.  POSTURE: Forward head and rounded shoulder.  UPPER EXTREMITY ROM:   Active  right shoulder flexion to 120 degrees and passive to 130 degrees.  Active left shoulder flexion to 145 degrees.  Full active right shoulder ER.  Behind back limited to right SIJ region.    UPPER EXTREMITY MMT:  Right shoulder IR/ER tested with right elbow by side is 4 to 4+/5. Abduction 3- to 3/5 limited likely in part due to pain.  SHOULDER SPECIAL TESTS: Positive right shoulder Impingement testing. (-) Drop Arm test. Normal UE DTR's.  PALPATION:  No specific areas of right shoulder tenderness though he experiences pain into his right biceps region.                                                                                                                                TREATMENT DATE:                                    07/03/23 EXERCISE LOG  Exercise Repetitions and Resistance Comments  Pulleys 5 minutes Flexion   R shoulder isometric flexion  2 minutes w/ 5 second hold  Added to HEP   Wall ladder  2 minutes Max #24  R shoulder isometric  3 minutes w/ 5 second hold Added to HEP  Ball roll out  2 minutes  For shoulder flexion; added to HEP (table slide)  Isometric ball press 2 minutes w/ 5 second hold  Added to HEP (seated)   Bicep curl  5# x 15 reps     Blank cell = exercise not performed today                07/01/23                     EXERCISE LOG  Exercise Repetitions and Resistance Comments  Pulleys 4 mins   Ranger Flex/ext; CW and CCW circles x 2 mins   Wall Ladder 5 reps (max# 25)   Rows Yellow x 20 reps bil   Extension Yellow x 20 reps bil   Horizontal Abduction Yellow x  20 reps bil    Blank cell = exercise not performed today   Manual Therapy Soft Tissue Mobilization: right shoulder, STW/M to right deltoid and bicep to decrease pain and tone     06/25/23:   HMP and IFC at 80-150 Hz on 40% scan x 20 minutes.  Normal modality response following removal of modality.    PATIENT EDUCATION: Education details: HEP (patient declined a handout) Person educated:  patient and wife  Education method: Verbal Education comprehension: patient reported understanding   HOME EXERCISE PROGRAM:   ASSESSMENT:  CLINICAL IMPRESSION: Patient was introduced to multiple new interventions for improved right shoulder mobility and rotator cuff strengthening with moderate difficulty. He required minimal cueing with today's isometric interventions for proper exercise performance to facilitate rotator cuff engagement. He experienced a mild increase in his familiar right shoulder symptoms with today's interventions with right shoulder mobility. However, this did not limit his ability to complete any of today's interventions. He reported that his shoulder felt about the same upon the conclusion of treatment. He continues to require skilled physical therapy to address his remaining impairments to maximize his functional mobility.    OBJECTIVE IMPAIRMENTS: decreased activity tolerance, decreased ROM, decreased strength, and pain.   ACTIVITY LIMITATIONS: carrying, lifting, and reach over head  PARTICIPATION LIMITATIONS: cleaning  REHAB POTENTIAL: Good  CLINICAL DECISION MAKING: Stable/uncomplicated  EVALUATION COMPLEXITY: Low   GOALS:   LONG TERM GOALS: Target date: 08/07/23.  Ind with a HEP.  Goal status: INITIAL  2.  Active right shoulder flexion to 135-140 degrees.  Goal status: INITIAL  3.  Increase right shoulder strength to a solid 4+ to 5/5 to increase stability for performance of functional activities.  Goal status: INITIAL  4.  Perform ADL's with right shoulder pain not > 3/10.  Goal status: INITIAL  PLAN:  PT FREQUENCY: 2x/week  PT DURATION: 6 weeks  PLANNED INTERVENTIONS: 97110-Therapeutic exercises, 97530- Therapeutic activity, 97112- Neuromuscular re-education, 97535- Self Care, 16109- Manual therapy, 97014- Electrical stimulation (unattended), 97016- Vasopneumatic device, 97035- Ultrasound, Patient/Family education, Dry Needling, Cryotherapy,  and Moist heat  PLAN FOR NEXT SESSION: Modalities and STW/M, UE Ranger, wall ladder, pulleys (no pain increase), RW4, PRE's.     Granville Lewis, PT 07/03/2023, 12:31 PM

## 2023-07-09 ENCOUNTER — Ambulatory Visit: Payer: Medicare PPO | Attending: Family Medicine

## 2023-07-09 DIAGNOSIS — M25511 Pain in right shoulder: Secondary | ICD-10-CM | POA: Diagnosis not present

## 2023-07-09 DIAGNOSIS — M25612 Stiffness of left shoulder, not elsewhere classified: Secondary | ICD-10-CM

## 2023-07-09 DIAGNOSIS — M25611 Stiffness of right shoulder, not elsewhere classified: Secondary | ICD-10-CM | POA: Insufficient documentation

## 2023-07-09 DIAGNOSIS — M6281 Muscle weakness (generalized): Secondary | ICD-10-CM

## 2023-07-09 DIAGNOSIS — M25512 Pain in left shoulder: Secondary | ICD-10-CM

## 2023-07-09 NOTE — Therapy (Signed)
 OUTPATIENT PHYSICAL THERAPY SHOULDER TREATMENT   Patient Name: Joe [name] MRN: [id number] DOB:1941/05/22, 83 y.o., male Today's Date: 07/09/2023  END OF SESSION:  PT End of Session - 07/09/23 1523     Visit Number 4    Number of Visits 12    Date for PT Re-Evaluation 08/06/23    PT Start Time 1513    PT Stop Time 1602    PT Time Calculation (min) 49 min    Activity Tolerance Patient tolerated treatment well    Behavior During Therapy [facility] for tasks assessed/performed               Past Medical History:  Diagnosis Date   Adenomatous colon polyp    Arthritis    Cataract    Chest pain at rest 05/26/2016   Diabetes mellitus without complication (HCC)    Dyslipidemia    ESOPHAGITIS, REFLUX 07/09/2004   Qualifier: Diagnosis of  By: Orlando CMA LEODIS), Stephanie     Hiatal hernia    HOH (hard of hearing)    has bilateral Hearing aids   Hypertension    Irritable bowel syndrome    Kidney stone 1970   Pneumonia    Tick bites    took 5 rounds of Doxycycline  this summer   Past Surgical History:  Procedure Laterality Date   COLONOSCOPY     ESOPHAGEAL DILATION  1996   ESOPHAGEAL DILATION  2003   FLEXIBLE SIGMOIDOSCOPY     HEMICOLECTOMY  08/2005   Right   KNEE SURGERY Right    TONSILLECTOMY     Patient Active Problem List   Diagnosis Date Noted   Sensorineural hearing loss (SNHL), bilateral 11/11/2022   Geographic tongue 07/11/2022   Neck sprain 02/11/2022   Gait instability 10/09/2021   Exposure to Agent Orange 04/04/2021   Seborrheic dermatitis, unspecified 04/04/2021   Presbycusis of both ears 05/09/2020   Chronic mycotic otitis externa 09/29/2019   Chronic left shoulder pain 08/13/2019   CKD stage 3 due to type 2 diabetes mellitus (HCC) 01/27/2019   Erectile dysfunction due to arterial insufficiency 04/15/2018   Thrombocytopenia (HCC) 03/18/2015   Atopic dermatitis 01/23/2015   Thoracic disc herniation 06/20/2014   Left varicocele 06/20/2014    Abdominal aortic atherosclerosis (HCC) 06/20/2014   Ventral hernia without obstruction or gangrene 02/04/2014   History of colonic polyps 02/04/2014   Hyperlipidemia associated with type 2 diabetes mellitus (HCC) 12/30/2012   Hypertension associated with diabetes (HCC) 12/30/2012   Benign prostatic hyperplasia 12/30/2012   Type 2 diabetes mellitus with diabetic neuropathy, with long-term current use of insulin  (HCC) 09/28/2012   RBBB 04/11/2010   Cardiovascular function study, abnormal 04/11/2010   Gastroesophageal reflux disease without esophagitis 09/14/2007   Nephrolithiasis 09/14/2007   REFERRING PROVIDER: Rock Bruns  REFERRING DIAG: Chronic right shoulder pain.  THERAPY DIAG:  Acute pain of left shoulder  Stiffness of left shoulder, not elsewhere classified  Muscle weakness (generalized)  Rationale for Evaluation and Treatment: Rehabilitation  ONSET DATE: ~6 weeks.  SUBJECTIVE:  SUBJECTIVE STATEMENT: Patient reports that his shoulder is really sore today from using his TENS unit last night.   PERTINENT HISTORY: See above.  PAIN:  Are you having pain? Yes: NPRS scale: bad Pain location: Right shoulder. Pain description: Sore, sharp. Aggravating factors: Movement of right shoulder. Relieving factors: Rest.    PRECAUTIONS: Fall.  Please supervise gait.   WEIGHT BEARING RESTRICTIONS: No  FALLS:  Has patient fallen in last 6 months? Yes. Number of falls    LIVING ENVIRONMENT: Lives with: lives with their spouse Lives in: House/apartment  PLOF: Independent  PATIENT GOALS:Use right shoulder without pain.  NEXT MD VISIT:   OBJECTIVE:  PATIENT SURVEYS:  FOTO 56.  POSTURE: Forward head and rounded shoulder.  UPPER EXTREMITY ROM:   Active right shoulder flexion to 120  degrees and passive to 130 degrees.  Active left shoulder flexion to 145 degrees.  Full active right shoulder ER.  Behind back limited to right SIJ region.    UPPER EXTREMITY MMT:  Right shoulder IR/ER tested with right elbow by side is 4 to 4+/5. Abduction 3- to 3/5 limited likely in part due to pain.  SHOULDER SPECIAL TESTS: Positive right shoulder Impingement testing. (-) Drop Arm test. Normal UE DTR's.  PALPATION:  No specific areas of right shoulder tenderness though he experiences pain into his right biceps region.                                                                                                                                TREATMENT DATE:                                    07/09/23 EXERCISE LOG  Exercise Repetitions and Resistance Comments  Pulleys 6 minutes Flexion  Cane bicep curl  2# x 20 reps    Seated cane flexion 2# x 15 reps    Bilateral shoulder ER  Yellow t-band x 18 reps    UE ranger (seated) 5 minutes  Circles  Isometric ball squeeze 10 reps w/ 5 second hold  For IR   Blank cell = exercise not performed today  Manual Therapy Soft Tissue Mobilization: right biceps, for reduced pain and tone                                   07/03/23 EXERCISE LOG  Exercise Repetitions and Resistance Comments  Pulleys 5 minutes Flexion   R shoulder isometric flexion  2 minutes w/ 5 second hold  Added to HEP   Wall ladder  2 minutes Max #24  R shoulder isometric  3 minutes w/ 5 second hold Added to HEP  Ball roll out  2 minutes  For shoulder flexion; added to HEP (table slide)  Isometric ball press 2 minutes w/ 5 second hold  Added to HEP (seated)   Bicep curl  5# x 15 reps     Blank cell = exercise not performed today                07/01/23                     EXERCISE LOG  Exercise Repetitions and Resistance Comments  Pulleys 4 mins   Ranger Flex/ext; CW and CCW circles x 2 mins   Wall Ladder 5 reps (max# 25)   Rows Yellow x 20 reps bil   Extension Yellow x  20 reps bil   Horizontal Abduction Yellow x 20 reps bil    Blank cell = exercise not performed today   Manual Therapy Soft Tissue Mobilization: right shoulder, STW/M to right deltoid and bicep to decrease pain and tone    PATIENT EDUCATION: Education details: HEP and activity modification Person educated: patient and wife  Education method: Verbal Education comprehension: patient reported understanding   HOME EXERCISE PROGRAM:   ASSESSMENT:  CLINICAL IMPRESSION: Today's treatment focused on familiar interventions for improved right shoulder mobility and strength with moderate difficulty. He required minimal cueing with seated cane flexion for proper arc of motion to facilitate rotator cuff engagement without aggravating his familiar symptoms. He and his wife were educated on activity and home exercise program modifications to promote increased use of his right upper extremity without aggravating his familiar symptoms. They reported understanding. Manual therapy focused on soft tissue mobilization to his right biceps for reduced pain and tone with good results. He reported that his shoulder felt good upon the conclusion of treatment. He continues to require skilled physical therapy to address his remaining impairments to maximize his functional mobility.   OBJECTIVE IMPAIRMENTS: decreased activity tolerance, decreased ROM, decreased strength, and pain.   ACTIVITY LIMITATIONS: carrying, lifting, and reach over head  PARTICIPATION LIMITATIONS: cleaning  REHAB POTENTIAL: Good  CLINICAL DECISION MAKING: Stable/uncomplicated  EVALUATION COMPLEXITY: Low   GOALS:   LONG TERM GOALS: Target date: 08/07/23.  Ind with a HEP.  Goal status: INITIAL  2.  Active right shoulder flexion to 135-140 degrees.  Goal status: INITIAL  3.  Increase right shoulder strength to a solid 4+ to 5/5 to increase stability for performance of functional activities.  Goal status: INITIAL  4.  Perform ADL's  with right shoulder pain not > 3/10.  Goal status: INITIAL  PLAN:  PT FREQUENCY: 2x/week  PT DURATION: 6 weeks  PLANNED INTERVENTIONS: 97110-Therapeutic exercises, 97530- Therapeutic activity, 97112- Neuromuscular re-education, 97535- Self Care, [id number]- Manual therapy, 97014- Electrical stimulation (unattended), 97016- Vasopneumatic device, 97035- Ultrasound, Patient/Family education, Dry Needling, Cryotherapy, and Moist heat  PLAN FOR NEXT SESSION: Modalities and STW/M, UE Ranger, wall ladder, pulleys (no pain increase), RW4, PRE's.     Lacinda JAYSON Fass, PT 07/09/2023, 4:28 PM

## 2023-07-10 DIAGNOSIS — B351 Tinea unguium: Secondary | ICD-10-CM | POA: Diagnosis not present

## 2023-07-10 DIAGNOSIS — L84 Corns and callosities: Secondary | ICD-10-CM | POA: Diagnosis not present

## 2023-07-10 DIAGNOSIS — E1142 Type 2 diabetes mellitus with diabetic polyneuropathy: Secondary | ICD-10-CM | POA: Diagnosis not present

## 2023-07-10 DIAGNOSIS — M79676 Pain in unspecified toe(s): Secondary | ICD-10-CM | POA: Diagnosis not present

## 2023-07-11 ENCOUNTER — Ambulatory Visit: Payer: Medicare PPO

## 2023-07-11 DIAGNOSIS — M25611 Stiffness of right shoulder, not elsewhere classified: Secondary | ICD-10-CM | POA: Diagnosis not present

## 2023-07-11 DIAGNOSIS — M6281 Muscle weakness (generalized): Secondary | ICD-10-CM | POA: Diagnosis not present

## 2023-07-11 DIAGNOSIS — M25511 Pain in right shoulder: Secondary | ICD-10-CM | POA: Diagnosis not present

## 2023-07-11 DIAGNOSIS — M25512 Pain in left shoulder: Secondary | ICD-10-CM | POA: Diagnosis not present

## 2023-07-11 DIAGNOSIS — M25612 Stiffness of left shoulder, not elsewhere classified: Secondary | ICD-10-CM | POA: Diagnosis not present

## 2023-07-11 NOTE — Therapy (Signed)
 OUTPATIENT PHYSICAL THERAPY SHOULDER TREATMENT   Patient Name: Joe [name] MRN: [id number] DOB:[birth year], 83 y.o., male Today's Date: 07/11/2023  END OF SESSION:  PT End of Session - 07/11/23 1025     Visit Number 5    Number of Visits 12    Date for PT Re-Evaluation 08/06/23    PT Start Time 1020    PT Stop Time 1103    PT Time Calculation (min) 43 min    Activity Tolerance Patient tolerated treatment well    Behavior During Therapy [facility] for tasks assessed/performed               Past Medical History:  Diagnosis Date   Adenomatous colon polyp    Arthritis    Cataract    Chest pain at rest 05/26/2016   Diabetes mellitus without complication (HCC)    Dyslipidemia    ESOPHAGITIS, REFLUX 07/09/2004   Qualifier: Diagnosis of  By: Orlando CMA (AAMA), Stephanie     Hiatal hernia    HOH (hard of hearing)    has bilateral Hearing aids   Hypertension    Irritable bowel syndrome    Kidney stone 1970   Pneumonia    Tick bites    took 5 rounds of Doxycycline  this summer   Past Surgical History:  Procedure Laterality Date   COLONOSCOPY     ESOPHAGEAL DILATION  1996   ESOPHAGEAL DILATION  2003   FLEXIBLE SIGMOIDOSCOPY     HEMICOLECTOMY  08/2005   Right   KNEE SURGERY Right    TONSILLECTOMY     Patient Active Problem List   Diagnosis Date Noted   Sensorineural hearing loss (SNHL), bilateral 11/11/2022   Geographic tongue 07/11/2022   Neck sprain 02/11/2022   Gait instability 10/09/2021   Exposure to Agent Orange 04/04/2021   Seborrheic dermatitis, unspecified 04/04/2021   Presbycusis of both ears 05/09/2020   Chronic mycotic otitis externa 09/29/2019   Chronic left shoulder pain 08/13/2019   CKD stage 3 due to type 2 diabetes mellitus (HCC) 01/27/2019   Erectile dysfunction due to arterial insufficiency 04/15/2018   Thrombocytopenia (HCC) 03/18/2015   Atopic dermatitis 01/23/2015   Thoracic disc herniation 06/20/2014   Left varicocele 06/20/2014    Abdominal aortic atherosclerosis (HCC) 06/20/2014   Ventral hernia without obstruction or gangrene 02/04/2014   History of colonic polyps 02/04/2014   Hyperlipidemia associated with type 2 diabetes mellitus (HCC) 12/30/2012   Hypertension associated with diabetes (HCC) 12/30/2012   Benign prostatic hyperplasia 12/30/2012   Type 2 diabetes mellitus with diabetic neuropathy, with long-term current use of insulin  (HCC) 09/28/2012   RBBB 04/11/2010   Cardiovascular function study, abnormal 04/11/2010   Gastroesophageal reflux disease without esophagitis 09/14/2007   Nephrolithiasis 09/14/2007   REFERRING PROVIDER: Rock Bruns  REFERRING DIAG: Chronic right shoulder pain.  THERAPY DIAG:  Acute pain of left shoulder  Stiffness of left shoulder, not elsewhere classified  Rationale for Evaluation and Treatment: Rehabilitation  ONSET DATE: ~6 weeks.  SUBJECTIVE:  SUBJECTIVE STATEMENT: Pt reports 4/10 right shoulder pain.   PERTINENT HISTORY: See above.  PAIN:  Are you having pain? Yes: NPRS scale: 4/10 Pain location: Right shoulder. Pain description: Sore, sharp. Aggravating factors: Movement of right shoulder. Relieving factors: Rest.    PRECAUTIONS: Fall.  Please supervise gait.   WEIGHT BEARING RESTRICTIONS: No  FALLS:  Has patient fallen in last 6 months? Yes. Number of falls    LIVING ENVIRONMENT: Lives with: lives with their spouse Lives in: House/apartment  PLOF: Independent  PATIENT GOALS:Use right shoulder without pain.  NEXT MD VISIT:   OBJECTIVE:  PATIENT SURVEYS:  FOTO 56.  POSTURE: Forward head and rounded shoulder.  UPPER EXTREMITY ROM:   Active right shoulder flexion to 120 degrees and passive to 130 degrees.  Active left shoulder flexion to 145 degrees.  Full active  right shoulder ER.  Behind back limited to right SIJ region.    UPPER EXTREMITY MMT:  Right shoulder IR/ER tested with right elbow by side is 4 to 4+/5. Abduction 3- to 3/5 limited likely in part due to pain.  SHOULDER SPECIAL TESTS: Positive right shoulder Impingement testing. (-) Drop Arm test. Normal UE DTR's.  PALPATION:  No specific areas of right shoulder tenderness though he experiences pain into his right biceps region.                                                                                                                                TREATMENT DATE:                                    07/11/23 EXERCISE LOG  Exercise Repetitions and Resistance Comments  UBE 6 mins (3 mins forward/backward) 120 RPM   Pulleys 6 minutes Flexion  Cane chest press 2# x 20 reps   Cane bicep curl  2# x 25 reps    Seated cane flexion 2# x 25 reps    Bilateral shoulder ER  Yellow t-band x 20 reps    UE ranger (seated) 6 minutes  Circles  Isometric ball squeeze 10 reps w/ 5 second hold  For IR   Blank cell = exercise not performed today  Manual Therapy Soft Tissue Mobilization: right biceps, for reduced pain and tone                                   07/03/23 EXERCISE LOG  Exercise Repetitions and Resistance Comments  Pulleys 5 minutes Flexion   R shoulder isometric flexion  2 minutes w/ 5 second hold  Added to HEP   Wall ladder  2 minutes Max #24  R shoulder isometric  3 minutes w/ 5 second hold Added to HEP  Ball roll out  2 minutes  For shoulder flexion; added to HEP (table slide)  Isometric ball press 2 minutes w/ 5 second hold  Added to HEP (seated)   Bicep curl  5# x 15 reps     Blank cell = exercise not performed today                07/01/23                     EXERCISE LOG  Exercise Repetitions and Resistance Comments  Pulleys 4 mins   Ranger Flex/ext; CW and CCW circles x 2 mins   Wall Ladder 5 reps (max# 25)   Rows Yellow x 20 reps bil   Extension Yellow x 20 reps bil    Horizontal Abduction Yellow x 20 reps bil    Blank cell = exercise not performed today   Manual Therapy Soft Tissue Mobilization: right shoulder, STW/M to right deltoid and bicep to decrease pain and tone    PATIENT EDUCATION: Education details: HEP and activity modification Person educated: patient and wife  Education method: Verbal Education comprehension: patient reported understanding   HOME EXERCISE PROGRAM:   ASSESSMENT:  CLINICAL IMPRESSION: Pt arrives for today's treatment session reporting 4/10 right shoulder pain.  Pt introduced to UBE for warm up today without discomfort or pain.  Pt enjoys UBE at beginning of treatment.  Pt able to tolerate increased reps with previously performed exercises today.  Pt introduced to seated chest press today with min cues for proper technique.  STW/M performed to right bicep and bicep tendon to decrease pain and tone.  Pt reported decreased pain at completion of today's treatment session.   OBJECTIVE IMPAIRMENTS: decreased activity tolerance, decreased ROM, decreased strength, and pain.   ACTIVITY LIMITATIONS: carrying, lifting, and reach over head  PARTICIPATION LIMITATIONS: cleaning  REHAB POTENTIAL: Good  CLINICAL DECISION MAKING: Stable/uncomplicated  EVALUATION COMPLEXITY: Low   GOALS:   LONG TERM GOALS: Target date: 08/07/23.  Ind with a HEP.  Goal status: INITIAL  2.  Active right shoulder flexion to 135-140 degrees.  Goal status: INITIAL  3.  Increase right shoulder strength to a solid 4+ to 5/5 to increase stability for performance of functional activities.  Goal status: INITIAL  4.  Perform ADL's with right shoulder pain not > 3/10.  Goal status: INITIAL  PLAN:  PT FREQUENCY: 2x/week  PT DURATION: 6 weeks  PLANNED INTERVENTIONS: 97110-Therapeutic exercises, 97530- Therapeutic activity, 97112- Neuromuscular re-education, 97535- Self Care, [id number]- Manual therapy, 97014- Electrical stimulation (unattended),  97016- Vasopneumatic device, 97035- Ultrasound, Patient/Family education, Dry Needling, Cryotherapy, and Moist heat  PLAN FOR NEXT SESSION: Modalities and STW/M, UE Ranger, wall ladder, pulleys (no pain increase), RW4, PRE's.     Delon DELENA Gosling, PTA 07/11/2023, 11:39 AM

## 2023-07-14 ENCOUNTER — Ambulatory Visit: Payer: Medicare PPO | Admitting: *Deleted

## 2023-07-14 ENCOUNTER — Encounter: Payer: Self-pay | Admitting: *Deleted

## 2023-07-14 DIAGNOSIS — M6281 Muscle weakness (generalized): Secondary | ICD-10-CM | POA: Diagnosis not present

## 2023-07-14 DIAGNOSIS — M25511 Pain in right shoulder: Secondary | ICD-10-CM | POA: Diagnosis not present

## 2023-07-14 DIAGNOSIS — M25611 Stiffness of right shoulder, not elsewhere classified: Secondary | ICD-10-CM

## 2023-07-14 DIAGNOSIS — M25612 Stiffness of left shoulder, not elsewhere classified: Secondary | ICD-10-CM | POA: Diagnosis not present

## 2023-07-14 DIAGNOSIS — M25512 Pain in left shoulder: Secondary | ICD-10-CM | POA: Diagnosis not present

## 2023-07-14 NOTE — Therapy (Signed)
 OUTPATIENT PHYSICAL THERAPY SHOULDER TREATMENT   Patient Name: Joe [name] MRN: [id number] DOB:1941-02-04, 83 y.o., male Today's Date: 07/14/2023  END OF SESSION:  PT End of Session - 07/14/23 1430     Visit Number 6    Number of Visits 12    Date for PT Re-Evaluation 08/06/23    PT Start Time 1431    PT Stop Time 1520    PT Time Calculation (min) 49 min               Past Medical History:  Diagnosis Date   Adenomatous colon polyp    Arthritis    Cataract    Chest pain at rest 05/26/2016   Diabetes mellitus without complication (HCC)    Dyslipidemia    ESOPHAGITIS, REFLUX 07/09/2004   Qualifier: Diagnosis of  By: Valda Garnet CMA Nonie Beady), Stephanie     Hiatal hernia    HOH (hard of hearing)    has bilateral Hearing aids   Hypertension    Irritable bowel syndrome    Kidney stone 1970   Pneumonia    Tick bites    took 5 rounds of Doxycycline  this summer   Past Surgical History:  Procedure Laterality Date   COLONOSCOPY     ESOPHAGEAL DILATION  1996   ESOPHAGEAL DILATION  2003   FLEXIBLE SIGMOIDOSCOPY     HEMICOLECTOMY  08/2005   Right   KNEE SURGERY Right    TONSILLECTOMY     Patient Active Problem List   Diagnosis Date Noted   Sensorineural hearing loss (SNHL), bilateral 11/11/2022   Geographic tongue 07/11/2022   Neck sprain 02/11/2022   Gait instability 10/09/2021   Exposure to Agent Orange 04/04/2021   Seborrheic dermatitis, unspecified 04/04/2021   Presbycusis of both ears 05/09/2020   Chronic mycotic otitis externa 09/29/2019   Chronic left shoulder pain 08/13/2019   CKD stage 3 due to type 2 diabetes mellitus (HCC) 01/27/2019   Erectile dysfunction due to arterial insufficiency 04/15/2018   Thrombocytopenia (HCC) 03/18/2015   Atopic dermatitis 01/23/2015   Thoracic disc herniation 06/20/2014   Left varicocele 06/20/2014   Abdominal aortic atherosclerosis (HCC) 06/20/2014   Ventral hernia without obstruction or gangrene 02/04/2014   History of  colonic polyps 02/04/2014   Hyperlipidemia associated with type 2 diabetes mellitus (HCC) 12/30/2012   Hypertension associated with diabetes (HCC) 12/30/2012   Benign prostatic hyperplasia 12/30/2012   Type 2 diabetes mellitus with diabetic neuropathy, with long-term current use of insulin  (HCC) 09/28/2012   RBBB 04/11/2010   Cardiovascular function study, abnormal 04/11/2010   Gastroesophageal reflux disease without esophagitis 09/14/2007   Nephrolithiasis 09/14/2007   REFERRING PROVIDER: Evalyn Hillier  REFERRING DIAG: Chronic right shoulder pain.  THERAPY DIAG:  Acute pain of right shoulder  Stiffness of right shoulder, not elsewhere classified  Rationale for Evaluation and Treatment: Rehabilitation  ONSET DATE: ~6 weeks.  SUBJECTIVE:  SUBJECTIVE STATEMENT: Pt reports 4-5/10 right shoulder pain. Doing about the same  PERTINENT HISTORY: See above.  PAIN:  Are you having pain? Yes: NPRS scale: 4/10 Pain location: Right shoulder. Pain description: Sore, sharp. Aggravating factors: Movement of right shoulder. Relieving factors: Rest.    PRECAUTIONS: Fall.  Please supervise gait.   WEIGHT BEARING RESTRICTIONS: No  FALLS:  Has patient fallen in last 6 months? Yes. Number of falls    LIVING ENVIRONMENT: Lives with: lives with their spouse Lives in: House/apartment  PLOF: Independent  PATIENT GOALS:Use right shoulder without pain.  NEXT MD VISIT:   OBJECTIVE:  PATIENT SURVEYS:  FOTO 56.  POSTURE: Forward head and rounded shoulder.  UPPER EXTREMITY ROM:   Active right shoulder flexion to 120 degrees and passive to 130 degrees.  Active left shoulder flexion to 145 degrees.  Full active right shoulder ER.  Behind back limited to right SIJ region.    UPPER EXTREMITY MMT:  Right  shoulder IR/ER tested with right elbow by side is 4 to 4+/5. Abduction 3- to 3/5 limited likely in part due to pain.  SHOULDER SPECIAL TESTS: Positive right shoulder Impingement testing. (-) Drop Arm test. Normal UE DTR's.  PALPATION:  No specific areas of right shoulder tenderness though he experiences pain into his right biceps region.                                                                                                                                TREATMENT DATE:                                    07/14/23 EXERCISE LOG   RT shldr  Exercise Repetitions and Resistance Comments  UBE 6 mins (3 mins forward/backward) 120 RPM   Pulleys 6 minutes Flexion  Rows/IR/ER Yellow 2x10 each slow   Cane chest press    Cane bicep curl     Seated cane flexion    Bilateral shoulder ER     UE ranger (seated)  Circles  Isometric ball squeeze  For IR   Blank cell = exercise not performed today  Manual Therapy Soft Tissue Mobilization: posterior cuff  and deltoid, for reduced pain and tone IFC x 15 mins 80-150hz  100% scan to RT shldr                                   07/03/23 EXERCISE LOG  Exercise Repetitions and Resistance Comments  Pulleys 5 minutes Flexion   R shoulder isometric flexion  2 minutes w/ 5 second hold  Added to HEP   Wall ladder  2 minutes Max #24  R shoulder isometric  3 minutes w/ 5 second hold Added to HEP  Ball roll out  2 minutes  For shoulder flexion; added to HEP (  table slide)  Isometric ball press 2 minutes w/ 5 second hold  Added to HEP (seated)   Bicep curl  5# x 15 reps     Blank cell = exercise not performed today                07/01/23                     EXERCISE LOG  Exercise Repetitions and Resistance Comments  Pulleys 4 mins   Ranger Flex/ext; CW and CCW circles x 2 mins   Wall Ladder 5 reps (max# 25)   Rows Yellow x 20 reps bil   Extension Yellow x 20 reps bil   Horizontal Abduction Yellow x 20 reps bil    Blank cell = exercise not performed  today   Manual Therapy Soft Tissue Mobilization: right shoulder, STW/M to right deltoid and bicep to decrease pain and tone    PATIENT EDUCATION: Education details: HEP and activity modification Person educated: patient and wife  Education method: Verbal Education comprehension: patient reported understanding   HOME EXERCISE PROGRAM:   ASSESSMENT:  CLINICAL IMPRESSION: Pt arrives for today's treatment session reporting 4-5/10 right shoulder pain. Rx focused on therex, STW, and IFC to RT shldr. Pt reports decreased pain end of session.   OBJECTIVE IMPAIRMENTS: decreased activity tolerance, decreased ROM, decreased strength, and pain.   ACTIVITY LIMITATIONS: carrying, lifting, and reach over head  PARTICIPATION LIMITATIONS: cleaning  REHAB POTENTIAL: Good  CLINICAL DECISION MAKING: Stable/uncomplicated  EVALUATION COMPLEXITY: Low   GOALS:   LONG TERM GOALS: Target date: 08/07/23.  Ind with a HEP.  Goal status: Partially met  2.  Active right shoulder flexion to 135-140 degrees.  Goal status: On going  3.  Increase right shoulder strength to a solid 4+ to 5/5 to increase stability for performance of functional activities.  Goal status: On going  4.  Perform ADL's with right shoulder pain not > 3/10.  Goal status: On going  PLAN:  PT FREQUENCY: 2x/week  PT DURATION: 6 weeks  PLANNED INTERVENTIONS: 97110-Therapeutic exercises, 97530- Therapeutic activity, 97112- Neuromuscular re-education, 97535- Self Care, 11914- Manual therapy, 97014- Electrical stimulation (unattended), 97016- Vasopneumatic device, 97035- Ultrasound, Patient/Family education, Dry Needling, Cryotherapy, and Moist heat  PLAN FOR NEXT SESSION: Modalities and STW/M, UE Ranger, wall ladder, pulleys (no pain increase), RW4, PRE's.     Sherrine Salberg,CHRIS, PTA 07/14/2023, 3:33 PM

## 2023-07-17 ENCOUNTER — Ambulatory Visit: Payer: Medicare PPO | Admitting: Family Medicine

## 2023-07-18 ENCOUNTER — Ambulatory Visit: Payer: Medicare PPO | Admitting: Physical Therapy

## 2023-07-18 DIAGNOSIS — M25511 Pain in right shoulder: Secondary | ICD-10-CM

## 2023-07-18 DIAGNOSIS — M25611 Stiffness of right shoulder, not elsewhere classified: Secondary | ICD-10-CM | POA: Diagnosis not present

## 2023-07-18 DIAGNOSIS — M25512 Pain in left shoulder: Secondary | ICD-10-CM | POA: Diagnosis not present

## 2023-07-18 DIAGNOSIS — M6281 Muscle weakness (generalized): Secondary | ICD-10-CM | POA: Diagnosis not present

## 2023-07-18 DIAGNOSIS — M25612 Stiffness of left shoulder, not elsewhere classified: Secondary | ICD-10-CM | POA: Diagnosis not present

## 2023-07-18 NOTE — Therapy (Signed)
OUTPATIENT PHYSICAL THERAPY SHOULDER TREATMENT   Patient Name: Joe [name] MRN: [id number] DOB:1941/01/10, 83 y.o., male Today's Date: 07/18/2023  END OF SESSION:  PT End of Session - 07/18/23 1246     Visit Number 7    Number of Visits 12    Date for PT Re-Evaluation 08/06/23    PT Start Time 1108    PT Stop Time 1159    PT Time Calculation (min) 51 min    Activity Tolerance Patient tolerated treatment well    Behavior During Therapy [facility] for tasks assessed/performed               Past Medical History:  Diagnosis Date   Adenomatous colon polyp    Arthritis    Cataract    Chest pain at rest 05/26/2016   Diabetes mellitus without complication (HCC)    Dyslipidemia    ESOPHAGITIS, REFLUX 07/09/2004   Qualifier: Diagnosis of  By: Vear Clock CMA Duncan Dull), Stephanie     Hiatal hernia    HOH (hard of hearing)    has bilateral Hearing aids   Hypertension    Irritable bowel syndrome    Kidney stone 1970   Pneumonia    Tick bites    took 5 rounds of Doxycycline this summer   Past Surgical History:  Procedure Laterality Date   COLONOSCOPY     ESOPHAGEAL DILATION  1996   ESOPHAGEAL DILATION  2003   FLEXIBLE SIGMOIDOSCOPY     HEMICOLECTOMY  08/2005   Right   KNEE SURGERY Right    TONSILLECTOMY     Patient Active Problem List   Diagnosis Date Noted   Sensorineural hearing loss (SNHL), bilateral 11/11/2022   Geographic tongue 07/11/2022   Neck sprain 02/11/2022   Gait instability 10/09/2021   Exposure to Agent Orange 04/04/2021   Seborrheic dermatitis, unspecified 04/04/2021   Presbycusis of both ears 05/09/2020   Chronic mycotic otitis externa 09/29/2019   Chronic left shoulder pain 08/13/2019   CKD stage 3 due to type 2 diabetes mellitus (HCC) 01/27/2019   Erectile dysfunction due to arterial insufficiency 04/15/2018   Thrombocytopenia (HCC) 03/18/2015   Atopic dermatitis 01/23/2015   Thoracic disc herniation 06/20/2014   Left varicocele 06/20/2014    Abdominal aortic atherosclerosis (HCC) 06/20/2014   Ventral hernia without obstruction or gangrene 02/04/2014   History of colonic polyps 02/04/2014   Hyperlipidemia associated with type 2 diabetes mellitus (HCC) 12/30/2012   Hypertension associated with diabetes (HCC) 12/30/2012   Benign prostatic hyperplasia 12/30/2012   Type 2 diabetes mellitus with diabetic neuropathy, with long-term current use of insulin (HCC) 09/28/2012   RBBB 04/11/2010   Cardiovascular function study, abnormal 04/11/2010   Gastroesophageal reflux disease without esophagitis 09/14/2007   Nephrolithiasis 09/14/2007   REFERRING PROVIDER: Gilford Silvius  REFERRING DIAG: Chronic right shoulder pain.  THERAPY DIAG:  Acute pain of right shoulder  Stiffness of right shoulder, not elsewhere classified  Rationale for Evaluation and Treatment: Rehabilitation  ONSET DATE: ~6 weeks.  SUBJECTIVE:  SUBJECTIVE STATEMENT: Pt reports 4-5/10 right shoulder pain. Doing about the same  PERTINENT HISTORY: See above.  PAIN:  Are you having pain? Yes: NPRS scale: 4/10 Pain location: Right shoulder. Pain description: Sore, sharp. Aggravating factors: Movement of right shoulder. Relieving factors: Rest.    PRECAUTIONS: Fall.  Please supervise gait.   WEIGHT BEARING RESTRICTIONS: No  FALLS:  Has patient fallen in last 6 months? Yes. Number of falls    LIVING ENVIRONMENT: Lives with: lives with their spouse Lives in: House/apartment  PLOF: Independent  PATIENT GOALS:Use right shoulder without pain.  NEXT MD VISIT:   OBJECTIVE:  PATIENT SURVEYS:  FOTO 56.  POSTURE: Forward head and rounded shoulder.  UPPER EXTREMITY ROM:   Active right shoulder flexion to 120 degrees and passive to 130 degrees.  Active left shoulder flexion to  145 degrees.  Full active right shoulder ER.  Behind back limited to right SIJ region.    UPPER EXTREMITY MMT:  Right shoulder IR/ER tested with right elbow by side is 4 to 4+/5. Abduction 3- to 3/5 limited likely in part due to pain.  SHOULDER SPECIAL TESTS: Positive right shoulder Impingement testing. (-) Drop Arm test. Normal UE DTR's.  PALPATION:  No specific areas of right shoulder tenderness though he experiences pain into his right biceps region.                                                                                                                                TREATMENT DATE:   07/18/23:  Combo e'stim/US at 1.50 W/CM2 x 12 minutes over patient's right bicipital groove f/b STW/M x 12 minutes including over right UT f/b HMP and IFC at 80-150 Hz on 40% scan x 20 minutes.   Normal modality response following removal of modality.                                    07/14/23 EXERCISE LOG   RT shldr  Exercise Repetitions and Resistance Comments  UBE 6 mins (3 mins forward/backward) 120 RPM   Pulleys 6 minutes Flexion  Rows/IR/ER Yellow 2x10 each slow   Cane chest press    Cane bicep curl     Seated cane flexion    Bilateral shoulder ER     UE ranger (seated)  Circles  Isometric ball squeeze  For IR   Blank cell = exercise not performed today  Manual Therapy Soft Tissue Mobilization: posterior cuff  and deltoid, for reduced pain and tone IFC x 15 mins 80-150hz  100% scan to RT shldr                                   07/03/23 EXERCISE LOG  Exercise Repetitions and Resistance Comments  Pulleys 5 minutes Flexion   R shoulder isometric flexion  2 minutes w/ 5 second hold  Added to HEP   Wall ladder  2 minutes Max #24  R shoulder isometric  3 minutes w/ 5 second hold Added to HEP  Ball roll out  2 minutes  For shoulder flexion; added to HEP (table slide)  Isometric ball press 2 minutes w/ 5 second hold  Added to HEP (seated)   Bicep curl  5# x 15 reps     Blank cell =  exercise not performed today                07/01/23                     EXERCISE LOG  Exercise Repetitions and Resistance Comments  Pulleys 4 mins   Ranger Flex/ext; CW and CCW circles x 2 mins   Wall Ladder 5 reps (max# 25)   Rows Yellow x 20 reps bil   Extension Yellow x 20 reps bil   Horizontal Abduction Yellow x 20 reps bil    Blank cell = exercise not performed today   Manual Therapy Soft Tissue Mobilization: right shoulder, STW/M to right deltoid and bicep to decrease pain and tone    PATIENT EDUCATION: Education details: HEP and activity modification Person educated: patient and wife  Education method: Verbal Education comprehension: patient reported understanding   HOME EXERCISE PROGRAM:   ASSESSMENT:  CLINICAL IMPRESSION: Patient was very palpably tender over his right bicipital groove and had a trigger point in the region of his UT/Supraspinatus region.  He did well with treatment today tolerating without complaint.    OBJECTIVE IMPAIRMENTS: decreased activity tolerance, decreased ROM, decreased strength, and pain.   ACTIVITY LIMITATIONS: carrying, lifting, and reach over head  PARTICIPATION LIMITATIONS: cleaning  REHAB POTENTIAL: Good  CLINICAL DECISION MAKING: Stable/uncomplicated  EVALUATION COMPLEXITY: Low   GOALS:   LONG TERM GOALS: Target date: 08/07/23.  Ind with a HEP.  Goal status: Partially met  2.  Active right shoulder flexion to 135-140 degrees.  Goal status: On going  3.  Increase right shoulder strength to a solid 4+ to 5/5 to increase stability for performance of functional activities.  Goal status: On going  4.  Perform ADL's with right shoulder pain not > 3/10.  Goal status: On going  PLAN:  PT FREQUENCY: 2x/week  PT DURATION: 6 weeks  PLANNED INTERVENTIONS: 97110-Therapeutic exercises, 97530- Therapeutic activity, 97112- Neuromuscular re-education, 97535- Self Care, 13086- Manual therapy, 97014- Electrical stimulation  (unattended), 97016- Vasopneumatic device, 97035- Ultrasound, Patient/Family education, Dry Needling, Cryotherapy, and Moist heat  PLAN FOR NEXT SESSION: Modalities and STW/M, UE Ranger, wall ladder, pulleys (no pain increase), RW4, PRE's.     Adeleigh Barletta, [place], PT 07/18/2023, 12:47 PM

## 2023-07-22 ENCOUNTER — Ambulatory Visit: Payer: Medicare PPO | Admitting: Physical Therapy

## 2023-07-22 DIAGNOSIS — M25511 Pain in right shoulder: Secondary | ICD-10-CM | POA: Diagnosis not present

## 2023-07-22 DIAGNOSIS — M25512 Pain in left shoulder: Secondary | ICD-10-CM | POA: Diagnosis not present

## 2023-07-22 DIAGNOSIS — M25612 Stiffness of left shoulder, not elsewhere classified: Secondary | ICD-10-CM | POA: Diagnosis not present

## 2023-07-22 DIAGNOSIS — M6281 Muscle weakness (generalized): Secondary | ICD-10-CM | POA: Diagnosis not present

## 2023-07-22 DIAGNOSIS — M25611 Stiffness of right shoulder, not elsewhere classified: Secondary | ICD-10-CM

## 2023-07-22 NOTE — Therapy (Addendum)
 OUTPATIENT PHYSICAL THERAPY SHOULDER TREATMENT   Patient Name: Joe [name] MRN: [id number] DOB:[birth year], 83 y.o., male Today's Date: 07/22/2023  END OF SESSION:  PT End of Session - 07/22/23 1535     Visit Number 8    Number of Visits 12    Date for PT Re-Evaluation 08/06/23    PT Start Time 0225    PT Stop Time 0318    PT Time Calculation (min) 53 min    Activity Tolerance Patient tolerated treatment well    Behavior During Therapy [facility] for tasks assessed/performed               Past Medical History:  Diagnosis Date   Adenomatous colon polyp    Arthritis    Cataract    Chest pain at rest 05/26/2016   Diabetes mellitus without complication (HCC)    Dyslipidemia    ESOPHAGITIS, REFLUX 07/09/2004   Qualifier: Diagnosis of  By: Vear Clock CMA Duncan Dull), Stephanie     Hiatal hernia    HOH (hard of hearing)    has bilateral Hearing aids   Hypertension    Irritable bowel syndrome    Kidney stone 1970   Pneumonia    Tick bites    took 5 rounds of Doxycycline this summer   Past Surgical History:  Procedure Laterality Date   COLONOSCOPY     ESOPHAGEAL DILATION  1996   ESOPHAGEAL DILATION  2003   FLEXIBLE SIGMOIDOSCOPY     HEMICOLECTOMY  08/2005   Right   KNEE SURGERY Right    TONSILLECTOMY     Patient Active Problem List   Diagnosis Date Noted   Sensorineural hearing loss (SNHL), bilateral 11/11/2022   Geographic tongue 07/11/2022   Neck sprain 02/11/2022   Gait instability 10/09/2021   Exposure to Agent Orange 04/04/2021   Seborrheic dermatitis, unspecified 04/04/2021   Presbycusis of both ears 05/09/2020   Chronic mycotic otitis externa 09/29/2019   Chronic left shoulder pain 08/13/2019   CKD stage 3 due to type 2 diabetes mellitus (HCC) 01/27/2019   Erectile dysfunction due to arterial insufficiency 04/15/2018   Thrombocytopenia (HCC) 03/18/2015   Atopic dermatitis 01/23/2015   Thoracic disc herniation 06/20/2014   Left varicocele 06/20/2014    Abdominal aortic atherosclerosis (HCC) 06/20/2014   Ventral hernia without obstruction or gangrene 02/04/2014   History of colonic polyps 02/04/2014   Hyperlipidemia associated with type 2 diabetes mellitus (HCC) 12/30/2012   Hypertension associated with diabetes (HCC) 12/30/2012   Benign prostatic hyperplasia 12/30/2012   Type 2 diabetes mellitus with diabetic neuropathy, with long-term current use of insulin (HCC) 09/28/2012   RBBB 04/11/2010   Cardiovascular function study, abnormal 04/11/2010   Gastroesophageal reflux disease without esophagitis 09/14/2007   Nephrolithiasis 09/14/2007   REFERRING PROVIDER: Gilford Silvius  REFERRING DIAG: Chronic right shoulder pain.  THERAPY DIAG:  Acute pain of right shoulder  Stiffness of right shoulder, not elsewhere classified  Rationale for Evaluation and Treatment: Rehabilitation  ONSET DATE: ~6 weeks.  SUBJECTIVE:  SUBJECTIVE STATEMENT: Last treatment helped some.    PERTINENT HISTORY: See above.  PAIN:  Are you having pain? Yes: NPRS scale: 4/10 Pain location: Right shoulder. Pain description: Sore, sharp. Aggravating factors: Movement of right shoulder. Relieving factors: Rest.    PRECAUTIONS: Fall.  Please supervise gait.   WEIGHT BEARING RESTRICTIONS: No  FALLS:  Has patient fallen in last 6 months? Yes. Number of falls    LIVING ENVIRONMENT: Lives with: lives with their spouse Lives in: House/apartment  PLOF: Independent  PATIENT GOALS:Use right shoulder without pain.  NEXT MD VISIT:   OBJECTIVE:  PATIENT SURVEYS:  FOTO 56.  POSTURE: Forward head and rounded shoulder.  UPPER EXTREMITY ROM:   Active right shoulder flexion to 120 degrees and passive to 130 degrees.  Active left shoulder flexion to 145 degrees.  Full active right  shoulder ER.  Behind back limited to right SIJ region.    UPPER EXTREMITY MMT:  Right shoulder IR/ER tested with right elbow by side is 4 to 4+/5. Abduction 3- to 3/5 limited likely in part due to pain.  SHOULDER SPECIAL TESTS: Positive right shoulder Impingement testing. (-) Drop Arm test. Normal UE DTR's.  PALPATION:  No specific areas of right shoulder tenderness though he experiences pain into his right biceps region.                                                                                            07/22/23:  UBE x 10 minutes f/b Combo e'stim/US at 1.50 W/CM2 x 7 minutes over patient's right bicipital groove f/b STW/M x 10 minutes over biceps and middle deltoid with ischemic release technique utilized f/b HMP and IFC at 80-150 Hz on 40% scan x 20 minutes.  Normal modality response following removal of modality.   07/18/23:  Combo e'stim/US at 1.50 W/CM2 x 12 minutes over patient's right bicipital groove f/b STW/M x 12 minutes including over right UT f/b HMP and IFC at 80-150 Hz on 40% scan x 20 minutes.   Normal modality response following removal of modality.                                    07/14/23 EXERCISE LOG   RT shldr  Exercise Repetitions and Resistance Comments  UBE 6 mins (3 mins forward/backward) 120 RPM   Pulleys 6 minutes Flexion  Rows/IR/ER Yellow 2x10 each slow   Cane chest press    Cane bicep curl     Seated cane flexion    Bilateral shoulder ER     UE ranger (seated)  Circles  Isometric ball squeeze  For IR   Blank cell = exercise not performed today  Manual Therapy Soft Tissue Mobilization: posterior cuff  and deltoid, for reduced pain and tone IFC x 15 mins 80-150hz  100% scan to RT shldr                                   07/03/23 EXERCISE LOG  Exercise Repetitions  and Resistance Comments  Pulleys 5 minutes Flexion   R shoulder isometric flexion  2 minutes w/ 5 second hold  Added to HEP   Wall ladder  2 minutes Max #24  R shoulder isometric   3 minutes w/ 5 second hold Added to HEP  Ball roll out  2 minutes  For shoulder flexion; added to HEP (table slide)  Isometric ball press 2 minutes w/ 5 second hold  Added to HEP (seated)   Bicep curl  5# x 15 reps     Blank cell = exercise not performed today                07/01/23                     EXERCISE LOG  Exercise Repetitions and Resistance Comments  Pulleys 4 mins   Ranger Flex/ext; CW and CCW circles x 2 mins   Wall Ladder 5 reps (max# 25)   Rows Yellow x 20 reps bil   Extension Yellow x 20 reps bil   Horizontal Abduction Yellow x 20 reps bil    Blank cell = exercise not performed today   Manual Therapy Soft Tissue Mobilization: right shoulder, STW/M to right deltoid and bicep to decrease pain and tone    PATIENT EDUCATION: Education details: HEP and activity modification Person educated: patient and wife  Education method: Verbal Education comprehension: patient reported understanding     HOME EXERCISE PROGRAM:   ASSESSMENT:  CLINICAL IMPRESSION: Notable trigger point over right middle deltoid.  Good response to treatment.  Discussed with patient and wife as dry needling as a possible intervention.    OBJECTIVE IMPAIRMENTS: decreased activity tolerance, decreased ROM, decreased strength, and pain.   ACTIVITY LIMITATIONS: carrying, lifting, and reach over head  PARTICIPATION LIMITATIONS: cleaning  REHAB POTENTIAL: Good  CLINICAL DECISION MAKING: Stable/uncomplicated  EVALUATION COMPLEXITY: Low   GOALS:   LONG TERM GOALS: Target date: 08/07/23.  Ind with a HEP.  Goal status: Partially met  2.  Active right shoulder flexion to 135-140 degrees.  Goal status: On going  3.  Increase right shoulder strength to a solid 4+ to 5/5 to increase stability for performance of functional activities.  Goal status: On going  4.  Perform ADL's with right shoulder pain not > 3/10.  Goal status: On going  PLAN:  PT FREQUENCY: 2x/week  PT DURATION: 6  weeks  PLANNED INTERVENTIONS: 97110-Therapeutic exercises, 97530- Therapeutic activity, 97112- Neuromuscular re-education, 97535- Self Care, 82956- Manual therapy, 97014- Electrical stimulation (unattended), 97016- Vasopneumatic device, 97035- Ultrasound, Patient/Family education, Dry Needling, Cryotherapy, and Moist heat  PLAN FOR NEXT SESSION: Modalities and STW/M, UE Ranger, wall ladder, pulleys (no pain increase), RW4, PRE's.     Ruhaan Nordahl, [place], PT 07/22/2023, 3:48 PM

## 2023-07-23 ENCOUNTER — Telehealth: Payer: Medicare PPO | Admitting: Family Medicine

## 2023-07-25 ENCOUNTER — Ambulatory Visit: Payer: Medicare PPO | Admitting: *Deleted

## 2023-07-25 ENCOUNTER — Other Ambulatory Visit: Payer: Self-pay | Admitting: Family Medicine

## 2023-07-25 ENCOUNTER — Encounter: Payer: Self-pay | Admitting: *Deleted

## 2023-07-25 DIAGNOSIS — M25611 Stiffness of right shoulder, not elsewhere classified: Secondary | ICD-10-CM | POA: Diagnosis not present

## 2023-07-25 DIAGNOSIS — M6281 Muscle weakness (generalized): Secondary | ICD-10-CM | POA: Diagnosis not present

## 2023-07-25 DIAGNOSIS — M25612 Stiffness of left shoulder, not elsewhere classified: Secondary | ICD-10-CM | POA: Diagnosis not present

## 2023-07-25 DIAGNOSIS — M25512 Pain in left shoulder: Secondary | ICD-10-CM | POA: Diagnosis not present

## 2023-07-25 DIAGNOSIS — E1140 Type 2 diabetes mellitus with diabetic neuropathy, unspecified: Secondary | ICD-10-CM

## 2023-07-25 DIAGNOSIS — M25511 Pain in right shoulder: Secondary | ICD-10-CM

## 2023-07-25 NOTE — Therapy (Signed)
 OUTPATIENT PHYSICAL THERAPY SHOULDER TREATMENT   Patient Name: Joe [name] MRN: [id number] DOB:[birth year], 83 y.o., male Today's Date: 07/25/2023  END OF SESSION:  PT End of Session - 07/25/23 1149     Visit Number 9    Number of Visits 12    Date for PT Re-Evaluation 08/06/23    PT Start Time 1100    PT Stop Time 1149    PT Time Calculation (min) 49 min                Past Medical History:  Diagnosis Date   Adenomatous colon polyp    Arthritis    Cataract    Chest pain at rest 05/26/2016   Diabetes mellitus without complication (HCC)    Dyslipidemia    ESOPHAGITIS, REFLUX 07/09/2004   Qualifier: Diagnosis of  By: Vear Clock CMA Duncan Dull), Stephanie     Hiatal hernia    HOH (hard of hearing)    has bilateral Hearing aids   Hypertension    Irritable bowel syndrome    Kidney stone 1970   Pneumonia    Tick bites    took 5 rounds of Doxycycline this summer   Past Surgical History:  Procedure Laterality Date   COLONOSCOPY     ESOPHAGEAL DILATION  1996   ESOPHAGEAL DILATION  2003   FLEXIBLE SIGMOIDOSCOPY     HEMICOLECTOMY  08/2005   Right   KNEE SURGERY Right    TONSILLECTOMY     Patient Active Problem List   Diagnosis Date Noted   Sensorineural hearing loss (SNHL), bilateral 11/11/2022   Geographic tongue 07/11/2022   Neck sprain 02/11/2022   Gait instability 10/09/2021   Exposure to Agent Orange 04/04/2021   Seborrheic dermatitis, unspecified 04/04/2021   Presbycusis of both ears 05/09/2020   Chronic mycotic otitis externa 09/29/2019   Chronic left shoulder pain 08/13/2019   CKD stage 3 due to type 2 diabetes mellitus (HCC) 01/27/2019   Erectile dysfunction due to arterial insufficiency 04/15/2018   Thrombocytopenia (HCC) 03/18/2015   Atopic dermatitis 01/23/2015   Thoracic disc herniation 06/20/2014   Left varicocele 06/20/2014   Abdominal aortic atherosclerosis (HCC) 06/20/2014   Ventral hernia without obstruction or gangrene 02/04/2014   History  of colonic polyps 02/04/2014   Hyperlipidemia associated with type 2 diabetes mellitus (HCC) 12/30/2012   Hypertension associated with diabetes (HCC) 12/30/2012   Benign prostatic hyperplasia 12/30/2012   Type 2 diabetes mellitus with diabetic neuropathy, with long-term current use of insulin (HCC) 09/28/2012   RBBB 04/11/2010   Cardiovascular function study, abnormal 04/11/2010   Gastroesophageal reflux disease without esophagitis 09/14/2007   Nephrolithiasis 09/14/2007   REFERRING PROVIDER: Gilford Silvius  REFERRING DIAG: Chronic right shoulder pain.  THERAPY DIAG:  Acute pain of right shoulder  Stiffness of right shoulder, not elsewhere classified  Rationale for Evaluation and Treatment: Rehabilitation  ONSET DATE: ~6 weeks.  SUBJECTIVE:  SUBJECTIVE STATEMENT: Last treatment helped some, but overall not much difference. DC after next visit and f/u with MD. Injection maybe.  PERTINENT HISTORY: See above.  PAIN:  Are you having pain? Yes: NPRS scale: 4/10 Pain location: Right shoulder. Pain description: Sore, sharp. Aggravating factors: Movement of right shoulder. Relieving factors: Rest.    PRECAUTIONS: Fall.  Please supervise gait.   WEIGHT BEARING RESTRICTIONS: No  FALLS:  Has patient fallen in last 6 months? Yes. Number of falls    LIVING ENVIRONMENT: Lives with: lives with their spouse Lives in: House/apartment  PLOF: Independent  PATIENT GOALS:Use right shoulder without pain.  NEXT MD VISIT:   OBJECTIVE:  PATIENT SURVEYS:  FOTO 56.  POSTURE: Forward head and rounded shoulder.  UPPER EXTREMITY ROM:   Active right shoulder flexion to 120 degrees and passive to 130 degrees.  Active left shoulder flexion to 145 degrees.  Full active right shoulder ER.  Behind back limited to  right SIJ region.    UPPER EXTREMITY MMT:  Right shoulder IR/ER tested with right elbow by side is 4 to 4+/5. Abduction 3- to 3/5 limited likely in part due to pain.  SHOULDER SPECIAL TESTS: Positive right shoulder Impingement testing. (-) Drop Arm test. Normal UE DTR's.  PALPATION:  No specific areas of right shoulder tenderness though he experiences pain into his right biceps region.                                                                                            07/25/23:  UBE x 10 minutes Pulleys x 5 mins RW4 with yellow tband x 15 each   STW/M x 10 minutes over biceps and middle deltoid with ischemic release technique utilized   HMP and IFC at 80-150 Hz on 40% scan x 15 minutes.  Normal modality response following removal of modality.   07/18/23:  Combo e'stim/US at 1.50 W/CM2 x 12 minutes over patient's right bicipital groove f/b STW/M x 12 minutes including over right UT f/b HMP and IFC at 80-150 Hz on 40% scan x 20 minutes.   Normal modality response following removal of modality.                                    07/14/23 EXERCISE LOG   RT shldr  Exercise Repetitions and Resistance Comments  UBE 6 mins (3 mins forward/backward) 120 RPM   Pulleys 6 minutes Flexion  Rows/IR/ER Yellow 2x10 each slow   Cane chest press    Cane bicep curl     Seated cane flexion    Bilateral shoulder ER     UE ranger (seated)  Circles  Isometric ball squeeze  For IR   Blank cell = exercise not performed today  Manual Therapy Soft Tissue Mobilization: posterior cuff  and deltoid, for reduced pain and tone IFC x 15 mins 80-150hz  100% scan to RT shldr  07/03/23 EXERCISE LOG  Exercise Repetitions and Resistance Comments  Pulleys 5 minutes Flexion   R shoulder isometric flexion  2 minutes w/ 5 second hold  Added to HEP   Wall ladder  2 minutes Max #24  R shoulder isometric  3 minutes w/ 5 second hold Added to HEP  Ball roll out  2 minutes  For  shoulder flexion; added to HEP (table slide)  Isometric ball press 2 minutes w/ 5 second hold  Added to HEP (seated)   Bicep curl  5# x 15 reps     Blank cell = exercise not performed today                07/01/23                     EXERCISE LOG  Exercise Repetitions and Resistance Comments  Pulleys 4 mins   Ranger Flex/ext; CW and CCW circles x 2 mins   Wall Ladder 5 reps (max# 25)   Rows Yellow x 20 reps bil   Extension Yellow x 20 reps bil   Horizontal Abduction Yellow x 20 reps bil    Blank cell = exercise not performed today   Manual Therapy Soft Tissue Mobilization: right shoulder, STW/M to right deltoid and bicep to decrease pain and tone    PATIENT EDUCATION: Education details: HEP and activity modification Person educated: patient and wife  Education method: Verbal Education comprehension: patient reported understanding     HOME EXERCISE PROGRAM:   ASSESSMENT:  CLINICAL IMPRESSION: Pt arrived today doing about the same with RT shldr pain and reports short term relief with Rx's. He was able to continue with AAROM, AROM as well as light strengthening exs. HMP and IFC end of session for pain management. Good response to treatment.  Decreased pain end of session. DC next visit.   OBJECTIVE IMPAIRMENTS: decreased activity tolerance, decreased ROM, decreased strength, and pain.   ACTIVITY LIMITATIONS: carrying, lifting, and reach over head  PARTICIPATION LIMITATIONS: cleaning  REHAB POTENTIAL: Good  CLINICAL DECISION MAKING: Stable/uncomplicated  EVALUATION COMPLEXITY: Low   GOALS:   LONG TERM GOALS: Target date: 08/07/23.  Ind with a HEP.  Goal status: Partially met  2.  Active right shoulder flexion to 135-140 degrees.  Goal status: NM  3.  Increase right shoulder strength to a solid 4+ to 5/5 to increase stability for performance of functional activities.  Goal status: On going  4.  Perform ADL's with right shoulder pain not > 3/10.  Goal status: On  going  PLAN:  PT FREQUENCY: 2x/week  PT DURATION: 6 weeks  PLANNED INTERVENTIONS: 97110-Therapeutic exercises, 97530- Therapeutic activity, 97112- Neuromuscular re-education, 97535- Self Care, 96295- Manual therapy, 97014- Electrical stimulation (unattended), 97016- Vasopneumatic device, 97035- Ultrasound, Patient/Family education, Dry Needling, Cryotherapy, and Moist heat  PLAN FOR NEXT SESSION: Modalities and STW/M, UE Ranger, wall ladder, pulleys (no pain increase), RW4, PRE's.     Melisse Caetano,CHRIS, PTA 07/25/2023, 12:14 PM

## 2023-07-30 ENCOUNTER — Ambulatory Visit: Payer: Medicare PPO

## 2023-07-30 DIAGNOSIS — M25611 Stiffness of right shoulder, not elsewhere classified: Secondary | ICD-10-CM | POA: Diagnosis not present

## 2023-07-30 DIAGNOSIS — M25511 Pain in right shoulder: Secondary | ICD-10-CM

## 2023-07-30 DIAGNOSIS — M25512 Pain in left shoulder: Secondary | ICD-10-CM | POA: Diagnosis not present

## 2023-07-30 DIAGNOSIS — M6281 Muscle weakness (generalized): Secondary | ICD-10-CM | POA: Diagnosis not present

## 2023-07-30 DIAGNOSIS — M25612 Stiffness of left shoulder, not elsewhere classified: Secondary | ICD-10-CM | POA: Diagnosis not present

## 2023-07-30 NOTE — Therapy (Signed)
 OUTPATIENT PHYSICAL THERAPY SHOULDER TREATMENT   Patient Name: Joe [name] MRN: [id number] DOB:[birth year], 83 y.o., male Today's Date: 07/30/2023  END OF SESSION:  PT End of Session - 07/30/23 1346     Visit Number 10    Number of Visits 12    Date for PT Re-Evaluation 08/06/23    PT Start Time 1345    PT Stop Time 1415    PT Time Calculation (min) 30 min                Past Medical History:  Diagnosis Date   Adenomatous colon polyp    Arthritis    Cataract    Chest pain at rest 05/26/2016   Diabetes mellitus without complication (HCC)    Dyslipidemia    ESOPHAGITIS, REFLUX 07/09/2004   Qualifier: Diagnosis of  By: Vear Clock CMA Duncan Dull), Stephanie     Hiatal hernia    HOH (hard of hearing)    has bilateral Hearing aids   Hypertension    Irritable bowel syndrome    Kidney stone 1970   Pneumonia    Tick bites    took 5 rounds of Doxycycline this summer   Past Surgical History:  Procedure Laterality Date   COLONOSCOPY     ESOPHAGEAL DILATION  1996   ESOPHAGEAL DILATION  2003   FLEXIBLE SIGMOIDOSCOPY     HEMICOLECTOMY  08/2005   Right   KNEE SURGERY Right    TONSILLECTOMY     Patient Active Problem List   Diagnosis Date Noted   Sensorineural hearing loss (SNHL), bilateral 11/11/2022   Geographic tongue 07/11/2022   Neck sprain 02/11/2022   Gait instability 10/09/2021   Exposure to Agent Orange 04/04/2021   Seborrheic dermatitis, unspecified 04/04/2021   Presbycusis of both ears 05/09/2020   Chronic mycotic otitis externa 09/29/2019   Chronic left shoulder pain 08/13/2019   CKD stage 3 due to type 2 diabetes mellitus (HCC) 01/27/2019   Erectile dysfunction due to arterial insufficiency 04/15/2018   Thrombocytopenia (HCC) 03/18/2015   Atopic dermatitis 01/23/2015   Thoracic disc herniation 06/20/2014   Left varicocele 06/20/2014   Abdominal aortic atherosclerosis (HCC) 06/20/2014   Ventral hernia without obstruction or gangrene 02/04/2014   History  of colonic polyps 02/04/2014   Hyperlipidemia associated with type 2 diabetes mellitus (HCC) 12/30/2012   Hypertension associated with diabetes (HCC) 12/30/2012   Benign prostatic hyperplasia 12/30/2012   Type 2 diabetes mellitus with diabetic neuropathy, with long-term current use of insulin (HCC) 09/28/2012   RBBB 04/11/2010   Cardiovascular function study, abnormal 04/11/2010   Gastroesophageal reflux disease without esophagitis 09/14/2007   Nephrolithiasis 09/14/2007   REFERRING PROVIDER: Gilford Silvius  REFERRING DIAG: Chronic right shoulder pain.  THERAPY DIAG:  Acute pain of right shoulder  Stiffness of right shoulder, not elsewhere classified  Rationale for Evaluation and Treatment: Rehabilitation  ONSET DATE: ~6 weeks.  SUBJECTIVE:  SUBJECTIVE STATEMENT: Pt arrives for today's treatment session denying any pain.  Pt ready for discharge today.   PERTINENT HISTORY: See above.  PAIN:  Are you having pain? No  PRECAUTIONS: Fall.  Please supervise gait.   WEIGHT BEARING RESTRICTIONS: No  FALLS:  Has patient fallen in last 6 months? Yes. Number of falls    LIVING ENVIRONMENT: Lives with: lives with their spouse Lives in: House/apartment  PLOF: Independent  PATIENT GOALS:Use right shoulder without pain.  NEXT MD VISIT:   OBJECTIVE:  PATIENT SURVEYS:  FOTO 56.  POSTURE: Forward head and rounded shoulder.  UPPER EXTREMITY ROM:   Active right shoulder flexion to 120 degrees and passive to 130 degrees.  Active left shoulder flexion to 145 degrees.  Full active right shoulder ER.  Behind back limited to right SIJ region.    UPPER EXTREMITY MMT:  Right shoulder IR/ER tested with right elbow by side is 4 to 4+/5. Abduction 3- to 3/5 limited likely in part due to pain.  SHOULDER  SPECIAL TESTS: Positive right shoulder Impingement testing. (-) Drop Arm test. Normal UE DTR's.  PALPATION:  No specific areas of right shoulder tenderness though he experiences pain into his right biceps region.                                                                                         07/30/23 EXERCISE LOG   RT shldr  Exercise Repetitions and Resistance Comments  UBE 10 mins (5 mins forward/backward) 120 RPM   Pulleys 6 minutes Flexion  Rows/IR/ER Yellow x 25 reps    Cane chest press    Cane bicep curl     Seated cane flexion    Bilateral shoulder ER     UE ranger (seated)  Circles  Isometric ball squeeze  For IR   Blank cell = exercise not performed today    07/25/23:  UBE x 10 minutes Pulleys x 5 mins RW4 with yellow tband x 15 each   STW/M x 10 minutes over biceps and middle deltoid with ischemic release technique utilized   HMP and IFC at 80-150 Hz on 40% scan x 15 minutes.  Normal modality response following removal of modality.   07/18/23:  Combo e'stim/US at 1.50 W/CM2 x 12 minutes over patient's right bicipital groove f/b STW/M x 12 minutes including over right UT f/b HMP and IFC at 80-150 Hz on 40% scan x 20 minutes.   Normal modality response following removal of modality.                                                                    PATIENT EDUCATION: Education details: HEP and activity modification Person educated: patient and wife  Education method: Verbal Education comprehension: patient reported understanding     HOME EXERCISE PROGRAM:   ASSESSMENT:  CLINICAL IMPRESSION: Pt arrives for today's treatment session denying any pain.  Pt  reports that he has been raking leaves today and blew leaves for a few hours yesterday with very minimal right shoulder pain (1/10).  Pt able to demonstrate 140 degrees of right shoulder flexion and 4+/5 to 5/5 global right shoulder strength today meeting both his ROM and strength goal.  Pt has met all the  goals set forth by physical therapy at this time.  Pt encouraged to call the facility with any questions or concerns.  Pt denied any pain at completion of today's treatment session.  Pt ready for discharge at this time.    OBJECTIVE IMPAIRMENTS: decreased activity tolerance, decreased ROM, decreased strength, and pain.   ACTIVITY LIMITATIONS: carrying, lifting, and reach over head  PARTICIPATION LIMITATIONS: cleaning  REHAB POTENTIAL: Good  CLINICAL DECISION MAKING: Stable/uncomplicated  EVALUATION COMPLEXITY: Low   GOALS:   LONG TERM GOALS: Target date: 08/07/23.  Ind with a HEP.  Goal status: MET  2.  Active right shoulder flexion to 135-140 degrees. 2/26: 140 degrees  Goal status: MET  3.  Increase right shoulder strength to a solid 4+ to 5/5 to increase stability for performance of functional activities.  Goal status: MET  4.  Perform ADL's with right shoulder pain not > 3/10.  Goal status: MET  PLAN:  PT FREQUENCY: 2x/week  PT DURATION: 6 weeks  PLANNED INTERVENTIONS: 97110-Therapeutic exercises, 97530- Therapeutic activity, 97112- Neuromuscular re-education, 97535- Self Care, 96045- Manual therapy, 97014- Electrical stimulation (unattended), 97016- Vasopneumatic device, 97035- Ultrasound, Patient/Family education, Dry Needling, Cryotherapy, and Moist heat  PLAN FOR NEXT SESSION: Modalities and STW/M, UE Ranger, wall ladder, pulleys (no pain increase), RW4, PRE's.     Newman Pies, PTA 07/30/2023, 2:20 PM   PHYSICAL THERAPY DISCHARGE SUMMARY  Visits from Start of Care: 10.  Current functional level related to goals / functional outcomes: See above.   Remaining deficits: All goals met.   Education / Equipment: HEP.   Patient agrees to discharge. Patient goals were met. Patient is being discharged due to meeting the stated rehab goals.    [place] Applegate MPT

## 2023-08-07 ENCOUNTER — Ambulatory Visit: Payer: Medicare PPO | Admitting: Family Medicine

## 2023-08-07 ENCOUNTER — Encounter: Payer: Self-pay | Admitting: Family Medicine

## 2023-08-07 VITALS — BP 120/55 | HR 58 | Temp 97.2°F | Ht 69.0 in | Wt 175.0 lb

## 2023-08-07 DIAGNOSIS — L2084 Intrinsic (allergic) eczema: Secondary | ICD-10-CM | POA: Diagnosis not present

## 2023-08-07 DIAGNOSIS — I714 Abdominal aortic aneurysm, without rupture, unspecified: Secondary | ICD-10-CM

## 2023-08-07 DIAGNOSIS — Z794 Long term (current) use of insulin: Secondary | ICD-10-CM | POA: Diagnosis not present

## 2023-08-07 DIAGNOSIS — E1122 Type 2 diabetes mellitus with diabetic chronic kidney disease: Secondary | ICD-10-CM

## 2023-08-07 DIAGNOSIS — N183 Chronic kidney disease, stage 3 unspecified: Secondary | ICD-10-CM

## 2023-08-07 DIAGNOSIS — G8929 Other chronic pain: Secondary | ICD-10-CM

## 2023-08-07 DIAGNOSIS — E785 Hyperlipidemia, unspecified: Secondary | ICD-10-CM

## 2023-08-07 DIAGNOSIS — I700 Atherosclerosis of aorta: Secondary | ICD-10-CM | POA: Diagnosis not present

## 2023-08-07 DIAGNOSIS — R7309 Other abnormal glucose: Secondary | ICD-10-CM

## 2023-08-07 DIAGNOSIS — E1159 Type 2 diabetes mellitus with other circulatory complications: Secondary | ICD-10-CM | POA: Diagnosis not present

## 2023-08-07 DIAGNOSIS — K219 Gastro-esophageal reflux disease without esophagitis: Secondary | ICD-10-CM | POA: Diagnosis not present

## 2023-08-07 DIAGNOSIS — E1169 Type 2 diabetes mellitus with other specified complication: Secondary | ICD-10-CM | POA: Diagnosis not present

## 2023-08-07 DIAGNOSIS — M25512 Pain in left shoulder: Secondary | ICD-10-CM | POA: Diagnosis not present

## 2023-08-07 DIAGNOSIS — E1140 Type 2 diabetes mellitus with diabetic neuropathy, unspecified: Secondary | ICD-10-CM | POA: Diagnosis not present

## 2023-08-07 LAB — BAYER DCA HB A1C WAIVED: HB A1C (BAYER DCA - WAIVED): 8 % — ABNORMAL HIGH (ref 4.8–5.6)

## 2023-08-07 MED ORDER — SIMVASTATIN 40 MG PO TABS
ORAL_TABLET | ORAL | 1 refills | Status: DC
Start: 2023-08-07 — End: 2024-02-09

## 2023-08-07 MED ORDER — TRIAMCINOLONE ACETONIDE 0.1 % EX CREA
1.0000 | TOPICAL_CREAM | Freq: Two times a day (BID) | CUTANEOUS | 0 refills | Status: DC
Start: 2023-08-07 — End: 2024-01-02

## 2023-08-07 MED ORDER — ESOMEPRAZOLE MAGNESIUM 40 MG PO CPDR: DELAYED_RELEASE_CAPSULE | ORAL | 1 refills | Status: DC

## 2023-08-07 MED ORDER — VITAMINS A & D EX OINT: TOPICAL_OINTMENT | Freq: Two times a day (BID) | CUTANEOUS | 1 refills | Status: AC | PRN

## 2023-08-07 MED ORDER — METFORMIN HCL ER 500 MG PO TB24: ORAL_TABLET | ORAL | 1 refills | Status: DC

## 2023-08-07 MED ORDER — TRESIBA FLEXTOUCH 100 UNIT/ML ~~LOC~~ SOPN
22.0000 [IU] | PEN_INJECTOR | Freq: Every day | SUBCUTANEOUS | 0 refills | Status: DC
Start: 2023-08-07 — End: 2023-08-18

## 2023-08-07 MED ORDER — PEN NEEDLES 32G X 5 MM MISC: 1.0000 | Freq: Every day | 3 refills | Status: AC

## 2023-08-07 NOTE — Patient Instructions (Addendum)
 Highly Insulin Resistant: < 150: 0 units 150-199: 3 units 200-249: 6 units 250-299: 9 units 300-349: 12 units >/= 350: 15 units  Bedtime: 250-299: 0-2 units 300-349: 2-4 units >/= 350: 4-6 units   Continue to monitor your blood sugars as we discussed and record them. Bring the log to your next appointment.  Take your medications as directed.    Goal Blood glucose:    Fasting (before meals) = 80 to 130   Within 2 hours of eating = less than 180   Understanding your Hemoglobin A1c: 8.0     Diabetes Mellitus and Nutrition    I think that you would greatly benefit from seeing a nutritionist. If this is something you are interested in, please call Dr Gerilyn Pilgrim at [phone] to schedule an appointment.   When you have diabetes (diabetes mellitus), it is very important to have healthy eating habits because your blood sugar (glucose) levels are greatly affected by what you eat and drink. Eating healthy foods in the appropriate amounts, at about the same times every day, can help you: Control your blood glucose. Lower your risk of heart disease. Improve your blood pressure. Reach or maintain a healthy weight.  Every person with diabetes is different, and each person has different needs for a meal plan. Your health care provider may recommend that you work with a diet and nutrition specialist (dietitian) to make a meal plan that is best for you. Your meal plan may vary depending on factors such as: The calories you need. The medicines you take. Your weight. Your blood glucose, blood pressure, and cholesterol levels. Your activity level. Other health conditions you have, such as heart or kidney disease.  How do carbohydrates affect me? Carbohydrates affect your blood glucose level more than any other type of food. Eating carbohydrates naturally increases the amount of glucose in your blood. Carbohydrate counting is a method for keeping track of how many carbohydrates you eat.  Counting carbohydrates is important to keep your blood glucose at a healthy level, especially if you use insulin or take certain oral diabetes medicines. It is important to know how many carbohydrates you can safely have in each meal. This is different for every person. Your dietitian can help you calculate how many carbohydrates you should have at each meal and for snack. Foods that contain carbohydrates include: Bread, cereal, rice, pasta, and crackers. Potatoes and corn. Peas, beans, and lentils. Milk and yogurt. Fruit and juice. Desserts, such as cakes, cookies, ice cream, and candy.  How does alcohol affect me? Alcohol can cause a sudden decrease in blood glucose (hypoglycemia), especially if you use insulin or take certain oral diabetes medicines. Hypoglycemia can be a life-threatening condition. Symptoms of hypoglycemia (sleepiness, dizziness, and confusion) are similar to symptoms of having too much alcohol. If your health care provider says that alcohol is safe for you, follow these guidelines: Limit alcohol intake to no more than 1 drink per day for nonpregnant women and 2 drinks per day for men. One drink equals 12 oz of beer, 5 oz of wine, or 1 oz of hard liquor. Do not drink on an empty stomach. Keep yourself hydrated with water, diet soda, or unsweetened iced tea. Keep in mind that regular soda, juice, and other mixers may contain a lot of sugar and must be counted as carbohydrates.  What are tips for following this plan?  Reading food labels Start by checking the serving size on the label. The amount of calories, carbohydrates, fats,  and other nutrients listed on the label are based on one serving of the food. Many foods contain more than one serving per package. Check the total grams (g) of carbohydrates in one serving. You can calculate the number of servings of carbohydrates in one serving by dividing the total carbohydrates by 15. For example, if a food has 30 g of total  carbohydrates, it would be equal to 2 servings of carbohydrates. Check the number of grams (g) of saturated and trans fats in one serving. Choose foods that have low or no amount of these fats. Check the number of milligrams (mg) of sodium in one serving. Most people should limit total sodium intake to less than 2,300 mg per day. Always check the nutrition information of foods labeled as "low-fat" or "nonfat". These foods may be higher in added sugar or refined carbohydrates and should be avoided. Talk to your dietitian to identify your daily goals for nutrients listed on the label.  Shopping Avoid buying canned, premade, or processed foods. These foods tend to be high in fat, sodium, and added sugar. Shop around the outside edge of the grocery store. This includes fresh fruits and vegetables, bulk grains, fresh meats, and fresh dairy.  Cooking Use low-heat cooking methods, such as baking, instead of high-heat cooking methods like deep frying. Cook using healthy oils, such as olive, canola, or sunflower oil. Avoid cooking with butter, cream, or high-fat meats.  Meal planning Eat meals and snacks regularly, preferably at the same times every day. Avoid going long periods of time without eating. Eat foods high in fiber, such as fresh fruits, vegetables, beans, and whole grains. Talk to your dietitian about how many servings of carbohydrates you can eat at each meal. Eat 4-6 ounces of lean protein each day, such as lean meat, chicken, fish, eggs, or tofu. 1 ounce is equal to 1 ounce of meat, chicken, or fish, 1 egg, or 1/4 cup of tofu. Eat some foods each day that contain healthy fats, such as avocado, nuts, seeds, and fish.  Lifestyle  Check your blood glucose regularly. Exercise at least 30 minutes 5 or more days each week, or as told by your health care provider. Take medicines as told by your health care provider. Do not use any products that contain nicotine or tobacco, such as cigarettes  and e-cigarettes. If you need help quitting, ask your health care provider. Work with a Veterinary surgeon or diabetes educator to identify strategies to manage stress and any emotional and social challenges.  What are some questions to ask my health care provider? Do I need to meet with a diabetes educator? Do I need to meet with a dietitian? What number can I call if I have questions? When are the best times to check my blood glucose?  Where to find more information: American Diabetes Association: diabetes.org/food-and-fitness/food Academy of Nutrition and Dietetics: https://www.vargas.com/ [employer] of Diabetes and Digestive and Kidney Diseases (NIH): FindJewelers.cz  Summary A healthy meal plan will help you control your blood glucose and maintain a healthy lifestyle. Working with a diet and nutrition specialist (dietitian) can help you make a meal plan that is best for you. Keep in mind that carbohydrates and alcohol have immediate effects on your blood glucose levels. It is important to count carbohydrates and to use alcohol carefully. This information is not intended to replace advice given to you by your health care provider. Make sure you discuss any questions you have with your health care provider. Document Released:  02/14/2005 Document Revised: 06/24/2016 Document Reviewed: 06/24/2016 Elsevier Interactive Patient Education  [employer].

## 2023-08-07 NOTE — Progress Notes (Addendum)
 Subjective:  [name], male    DOB: January 07, 1941, 83 y.o.   MRN: [id number]  Patient Care Team: Galvin Jules, FNP as PCP - General (Family Medicine) Homero Luster, MD (Urology) Kenney Peacemaker, MD (Gastroenterology) Eilleen Grates, MD as Consulting Physician (Cardiology) Delilah Fend, [facility] (Pharmacist) Hyland Mailman, MD as Referring Physician (Optometry) Delilah Fend, [facility] as Pharmacist (Family Medicine)   Chief Complaint:  Diabetes (3 month follow up ) and Blood Sugar Problem (Patient states that he has been having high and low BS. )   HPI: Joe [name] is a 83 y.o. male presenting on 08/07/2023 for Diabetes (3 month follow up ) and Blood Sugar Problem (Patient states that he has been having high and low BS. )   Discussed the use of AI scribe software for clinical note transcription with the patient, who gave verbal consent to proceed.  History of Present Illness   Joe [name] is a 83 year old male with diabetes and aortic aneurysm who presents with issues related to dental discomfort and blood sugar management. He is accompanied by a caregiver, who assists with his medical management.  He experiences significant discomfort with his dental plate following recent adjustments. After a tooth extraction, additional teeth were added to the plate, which was then relined, resulting in the plate rubbing and causing pain. He has not attempted any self-adjustments and plans to return to the dentist for further modifications.  His blood sugar levels are highly variable, with episodes of hypoglycemia and hyperglycemia. He notes a recent low of 66 mg/dL and instances where his blood sugar has spiked to the 400s mg/dL. He manages these fluctuations with dietary adjustments, including consuming candy to raise low blood sugar, although he finds it difficult to chew due to dental issues. He is currently taking [place], with doses ranging from 16 to 22 units based on his blood  sugar readings, and uses Aspart for mealtime insulin. His A1c is currently 8%.  He experiences neuropathy and has been advised to use supplements like Nervive or Healthy Feet and Nerves to manage symptoms.  He reports shoulder pain following a fall three months ago, which has improved with therapy but still causes muscle soreness. He uses Biofreeze for relief.  He has a history of an abdominal aortic aneurysm, first noted in 2017 during cardiac scoring. The aneurysm measures 3.5 cm and has not required intervention. He is unaware of any symptoms related to this condition.  He takes several medications, including metformin, Nexium, simvastatin, and olmesartan, which he takes every other day. He also uses topical medications like triamcinolone and betamethasone dipropionate for skin issues.          Relevant past medical, surgical, family, and social history reviewed and updated as indicated.  Allergies and medications reviewed and updated. Data reviewed: Chart in Epic.   Past Medical History:  Diagnosis Date  . Adenomatous colon polyp   . Arthritis   . Cataract   . Chest pain at rest 05/26/2016  . Diabetes mellitus without complication (HCC)   . Dyslipidemia   . ESOPHAGITIS, REFLUX 07/09/2004   Qualifier: Diagnosis of  By: Valda Garnet CMA (AAMA), Trevor Fudge    . Hiatal hernia   . HOH (hard of hearing)    has bilateral Hearing aids  . Hypertension   . Irritable bowel syndrome   . Kidney stone 1970  . Pneumonia   . Tick bites    took 5  rounds of Doxycycline this summer    Past Surgical History:  Procedure Laterality Date  . COLONOSCOPY    . ESOPHAGEAL DILATION  1996  . ESOPHAGEAL DILATION  2003  . FLEXIBLE SIGMOIDOSCOPY    . HEMICOLECTOMY  08/2005   Right  . KNEE SURGERY Right   . TONSILLECTOMY      Social History   Socioeconomic History  . Marital status: Married    Spouse name: Sallyanne Creamer  . Number of children: 1  . Years of education: 86  . Highest education level:  Bachelor's degree (e.g., BA, AB, BS)  Occupational History  . Occupation: Retired Runner, broadcasting/film/video  Tobacco Use  . Smoking status: Former    Current packs/day: 0.00    Average packs/day: 2.0 packs/day for 30.2 years (60.4 ttl pk-yrs)    Types: Cigarettes    Start date: 06/03/1954    Quit date: 08/24/1984    Years since quitting: 39.0  . Smokeless tobacco: Never  . Tobacco comments:    Has not used to tobacco products for the past 20 years  Vaping Use  . Vaping status: Never Used  Substance and Sexual Activity  . Alcohol use: Yes    Alcohol/week: 7.0 standard drinks of alcohol    Types: 7 Standard drinks or equivalent per week    Comment: Bourbon at night when he sits on the deck  . Drug use: No  . Sexual activity: Yes    Partners: Female  Other Topics Concern  . Not on file  Social History Narrative   Lives in [place] with wife, both retired teachers   1 son   3 caffeinated beverages daily   1 alcoholic beverage daily   Former smoker no current tobacco no drug use   Social Drivers of Corporate investment banker Strain: Low Risk  (04/04/2021)   Overall Financial Resource Strain (CARDIA)   . Difficulty of Paying Living Expenses: Not hard at all  Food Insecurity: Low Risk  (06/30/2023)   Received from [facility]   Hunger Vital Sign   . Worried About Programme researcher, broadcasting/film/video in the Last Year: Never true   . Ran Out of Food in the Last Year: Never true  Transportation Needs: No Transportation Needs (06/30/2023)   Received from [facility]   . In the past 12 months, has lack of reliable transportation kept you from medical appointments, meetings, work or from getting things needed for daily living? : No  Physical Activity: Insufficiently Active (04/04/2021)   Exercise Vital Sign   . Days of Exercise per Week: 3 days   . Minutes of Exercise per Session: 20 min  Stress: No Stress Concern Present (04/04/2021)   [employer] of Occupational Health - Occupational  Stress Questionnaire   . Feeling of Stress : Not at all  Social Connections: Moderately Isolated (04/04/2021)   Social Connection and Isolation Panel [NHANES]   . Frequency of Communication with Friends and Family: More than three times a week   . Frequency of Social Gatherings with Friends and Family: Once a week   . Attends Religious Services: Never   . Active Member of Clubs or Organizations: No   . Attends Banker Meetings: Never   . Marital Status: Married  Catering manager Violence: Not At Risk (04/04/2021)   Humiliation, Afraid, Rape, and Kick questionnaire   . Fear of Current or Ex-Partner: No   . Emotionally Abused: No   . Physically Abused: No   .  Sexually Abused: No    Outpatient Encounter Medications as of 08/07/2023  Medication Sig  . Accu-Chek Softclix Lancets lancets CHECK BLOOD SUGAR 4 TIMES A DAY OR AS DIRECTED  . aspirin 81 MG tablet Take 81 mg by mouth daily.  . betamethasone 0.5% cream-vitamin a&d ointment 1:1 mixture Apply topically 2 (two) times daily as needed.  . Blood Glucose Monitoring Suppl w/Device KIT Test BS BID and PRN dx E11.9. Give test strips and lancets to match machine given  . cholecalciferol (VITAMIN D) 1000 UNITS tablet Take 2,000 Units by mouth daily.  . Cinnamon 500 MG TABS Take 1,000 mg by mouth in the morning and at bedtime.  . Continuous Glucose Sensor (FREESTYLE LIBRE 2 SENSOR) MISC USE AS DIRECTED UP  TO  6  TIMES  DAILY, CHANGE EVERY 14 DAYS  . doxylamine, Sleep, (UNISOM) 25 MG tablet Take 25 mg by mouth at bedtime as needed for sleep.   Aaron Aas glucose blood (ACCU-CHEK GUIDE) test strip Check blood sugar 4 times daily Dx E 11.40  . insulin aspart (NOVOLOG FLEXPEN) 100 UNIT/ML FlexPen Inject 10-15 units in the morning before breakfast and before dinner as directed  . MELATONIN PO Take by mouth.  . Multiple Vitamin (MULTI VITAMIN) TABS 1 tablet Orally Once a day for 30 day(s)  . Probiotic Product (PROBIOTIC PO) Take 1 tablet by mouth  daily.  Aaron Aas triamcinolone cream (KENALOG) 0.1 % Apply 1 Application topically 2 (two) times daily.  . vitamin B-12 (CYANOCOBALAMIN) 100 MCG tablet Take 100 mcg by mouth daily. (Patient not taking: Reported on 09/10/2023)  . [DISCONTINUED] esomeprazole (NEXIUM) 40 MG capsule 1 capsule daily  . [DISCONTINUED] Insulin Pen Needle (PEN NEEDLES) 32G X 5 MM MISC 1 Device by Does not apply route daily.  . [DISCONTINUED] JANUVIA 100 MG tablet TAKE ONE TABLET ONCE DAILY  . [DISCONTINUED] JARDIANCE 25 MG TABS tablet TAKE ONE TABLET EVERY MORNING  . [DISCONTINUED] metFORMIN (GLUCOPHAGE-XR) 500 MG 24 hr tablet TAKE TWO TABLETS TWICE DAILY WITH MEAL(S)  . [DISCONTINUED] olmesartan (BENICAR) 20 MG tablet TAKE ONE TABLET DAILY  . [DISCONTINUED] simvastatin (ZOCOR) 40 MG tablet TAKE ONE TABLET DAILY EVERY EVENING  . [DISCONTINUED] TRESIBA FLEXTOUCH 100 UNIT/ML FlexTouch Pen Inject 6-20 Units into the skin daily at 10 pm.  . esomeprazole (NEXIUM) 40 MG capsule 1 capsule daily  . Insulin Pen Needle (PEN NEEDLES) 32G X 5 MM MISC 1 Device by Does not apply route daily.  . metFORMIN (GLUCOPHAGE-XR) 500 MG 24 hr tablet TAKE TWO TABLETS TWICE DAILY WITH MEAL(S)  . simvastatin (ZOCOR) 40 MG tablet TAKE ONE TABLET DAILY EVERY EVENING  . [DISCONTINUED] insulin degludec (TRESIBA FLEXTOUCH) 100 UNIT/ML FlexTouch Pen Inject 22 Units into the skin at bedtime.   No facility-administered encounter medications on file as of 08/07/2023.    Allergies  Allergen Reactions  . Ace Inhibitors Cough  . Trazodone Other (See Comments)  . Trazamine [Trazodone & Diet Manage Prod] Other (See Comments)    nightmares    Pertinent ROS per HPI, otherwise unremarkable      Objective:  BP (!) 120/55   Pulse (!) 58   Temp (!) 97.2 F (36.2 C)   Ht 5\' 9"  (1.753 m)   Wt 175 lb (79.4 kg)   SpO2 99%   BMI 25.84 kg/m    Wt Readings from Last 3 Encounters:  09/10/23 176 lb 9.6 oz (80.1 kg)  08/07/23 175 lb (79.4 kg)  06/18/23 173 lb  6.4 oz (78.7 kg)  Physical Exam Vitals and nursing note reviewed.  Constitutional:      General: He is not in acute distress.    Appearance: Normal appearance. He is not ill-appearing, toxic-appearing or diaphoretic.  HENT:     Head: Normocephalic and atraumatic.     Nose: Nose normal.     Mouth/Throat:     Mouth: Mucous membranes are moist.  Eyes:     Conjunctiva/sclera: Conjunctivae normal.     Pupils: Pupils are equal, round, and reactive to light.  Cardiovascular:     Rate and Rhythm: Normal rate and regular rhythm.     Pulses: Normal pulses.     Heart sounds: Normal heart sounds.  Pulmonary:     Effort: Pulmonary effort is normal.     Breath sounds: Normal breath sounds.  Musculoskeletal:        General: No tenderness. Normal range of motion.     Right shoulder: Normal.     Right upper arm: Normal.     Right elbow: Normal.     Right forearm: Normal.     Cervical back: Normal range of motion and neck supple.  Skin:    General: Skin is warm and dry.     Capillary Refill: Capillary refill takes less than 2 seconds.  Neurological:     General: No focal deficit present.     Mental Status: He is alert and oriented to person, place, and time.  Psychiatric:        Mood and Affect: Mood normal.        Behavior: Behavior normal.        Thought Content: Thought content normal.        Judgment: Judgment normal.    Results for orders placed or performed in visit on 08/07/23  Bayer DCA Hb A1c Waived   Collection Time: 08/07/23  8:43 AM  Result Value Ref Range   HB A1C (BAYER DCA - WAIVED) 8.0 (H) 4.8 - 5.6 %       Pertinent labs & imaging results that were available during my care of the patient were reviewed by me and considered in my medical decision making.  Assessment & Plan:  Arron was seen today for diabetes and blood sugar problem.  Diagnoses and all orders for this visit:  Type 2 diabetes mellitus with diabetic neuropathy, with long-term current use of  insulin (HCC) -     Bayer DCA Hb A1c Waived -     insulin degludec (TRESIBA FLEXTOUCH) 100 UNIT/ML FlexTouch Pen; Inject 22 Units into the skin at bedtime. -     metFORMIN (GLUCOPHAGE-XR) 500 MG 24 hr tablet; TAKE TWO TABLETS TWICE DAILY WITH MEAL(S) -     Insulin Pen Needle (PEN NEEDLES) 32G X 5 MM MISC; 1 Device by Does not apply route daily.  Hypertension associated with diabetes (HCC) -     Bayer DCA Hb A1c Waived  Hyperlipidemia associated with type 2 diabetes mellitus (HCC) -     Bayer DCA Hb A1c Waived -     simvastatin (ZOCOR) 40 MG tablet; TAKE ONE TABLET DAILY EVERY EVENING  Gastroesophageal reflux disease without esophagitis -     esomeprazole (NEXIUM) 40 MG capsule; 1 capsule daily  CKD stage 3 due to type 2 diabetes mellitus (HCC) -     Bayer DCA Hb A1c Waived  Labile blood glucose -     Bayer DCA Hb A1c Waived  Abdominal aortic atherosclerosis (HCC) -     US  AORTA; Future  Chronic left shoulder pain Improving   Intrinsic atopic dermatitis -     triamcinolone cream (KENALOG) 0.1 %; Apply 1 Application topically 2 (two) times daily. -     betamethasone 0.5% cream-vitamin a&d ointment 1:1 mixture; Apply topically 2 (two) times daily as needed.     Assessment and Plan    Denture Discomfort Significant discomfort and pain due to recent adjustments and relining of dentures following a tooth extraction. Dentures are rubbing and causing pain. - Advise return to dentist for adjustment of dentures.  Diabetes Mellitus Type 2 Labile blood glucose levels with episodes of hypoglycemia (as low as 66) and hyperglycemia (up to 400). Current A1c is 8.0, improved from previous readings of 8.9 and 8.3. On a sliding scale for insulin management using Tresiba (16-22 units) and Aspart (Novolog) for mealtime insulin. Discussed avoiding overcorrection of hypoglycemia to prevent hyperglycemia. Provided guidelines for incremental insulin dosing and monitoring. - Provide sliding scale for  mealtime insulin (Aspart). - Continue Tresiba as per sliding scale. - Monitor blood glucose levels and adjust insulin doses accordingly. - Educate on managing hypoglycemia without overcorrecting.  Peripheral Neuropathy Neuropathic pain managed with Nervive. Discussed switching to Healthy Feet and Nerves for potentially better symptom control due to higher GABA content. - Switch to Healthy Feet and Nerves supplement for neuropathic pain management.  Shoulder Pain Persistent shoulder pain and muscle soreness following a fall three months ago, likely tendonitis. Previous physical therapy and ultrasound treatments provided some relief. Discussed use of topical treatments and moist heat for symptom relief. - Recommend topical treatments such as Biofreeze, Icy Hot, or Bengay. - Advise application of moist heat to the affected area.  Abdominal Aortic Aneurysm Known abdominal aortic aneurysm, first noted in 2017 with a size of 3.5 cm. No current symptoms or need for intervention. Discussed importance of regular monitoring. - Order ultrasound to monitor size of aneurysm.  Hypertension Blood pressure well-controlled with current medication regimen, taking medication every other day. - Continue current blood pressure medication regimen.  Medication Management Requires refills for several medications and pen needles. - Refill metformin, Tresiba, Nexium, simvastatin, triamcinolone, and betamethasone dipropionate. - Refill pen needles.  General Health Maintenance Discussion about reducing medication load. Decision to continue current medications due to necessity for managing chronic conditions. Considered tapering off Nexium if not experiencing reflux symptoms, but prefers to continue. - Continue current medication regimen. - Consider tapering off Nexium if not experiencing reflux symptoms, but prefers to continue.  Follow-up - Schedule follow-up appointment for ultrasound of abdominal aortic  aneurysm. - Monitor blood glucose levels and adjust insulin doses as needed. - Follow-up as needed for denture adjustment.        Total time spent with patient today was 40 minutes.    Continue all other maintenance medications.  Follow up plan: Return in about 3 months (around 11/07/2023) for DM.   Continue healthy lifestyle choices, including diet (rich in fruits, vegetables, and lean proteins, and low in salt and simple carbohydrates) and exercise (at least 30 minutes of moderate physical activity daily).  Educational handout given for DM  The above assessment and management plan was discussed with the patient. The patient verbalized understanding of and has agreed to the management plan. Patient is aware to call the clinic if they develop any new symptoms or if symptoms persist or worsen. Patient is aware when to return to the clinic for a follow-up visit. Patient educated on when it is appropriate to go to the emergency department.  Kattie Parrot, FNP-C [facility] [phone]

## 2023-08-09 ENCOUNTER — Other Ambulatory Visit: Payer: Self-pay | Admitting: Family Medicine

## 2023-08-09 DIAGNOSIS — E1140 Type 2 diabetes mellitus with diabetic neuropathy, unspecified: Secondary | ICD-10-CM

## 2023-08-18 ENCOUNTER — Other Ambulatory Visit: Payer: Self-pay

## 2023-08-18 ENCOUNTER — Telehealth: Payer: Self-pay | Admitting: Family Medicine

## 2023-08-18 DIAGNOSIS — E1140 Type 2 diabetes mellitus with diabetic neuropathy, unspecified: Secondary | ICD-10-CM

## 2023-08-18 MED ORDER — TRESIBA FLEXTOUCH 100 UNIT/ML ~~LOC~~ SOPN: 22.0000 [IU] | PEN_INJECTOR | Freq: Every day | SUBCUTANEOUS | 0 refills | Status: DC

## 2023-08-18 NOTE — Telephone Encounter (Signed)
 Wife notified.

## 2023-08-19 ENCOUNTER — Ambulatory Visit (HOSPITAL_COMMUNITY)

## 2023-08-20 ENCOUNTER — Ambulatory Visit (HOSPITAL_COMMUNITY)
Admission: RE | Admit: 2023-08-20 | Discharge: 2023-08-20 | Disposition: A | Discharge status: Home / Self Care | Source: Ambulatory Visit | Attending: Family Medicine | Admitting: Family Medicine

## 2023-08-20 DIAGNOSIS — Z87891 Personal history of nicotine dependence: Secondary | ICD-10-CM | POA: Insufficient documentation

## 2023-08-20 DIAGNOSIS — I700 Atherosclerosis of aorta: Secondary | ICD-10-CM

## 2023-08-20 DIAGNOSIS — E119 Type 2 diabetes mellitus without complications: Secondary | ICD-10-CM | POA: Diagnosis not present

## 2023-08-20 DIAGNOSIS — I77811 Abdominal aortic ectasia: Secondary | ICD-10-CM | POA: Insufficient documentation

## 2023-08-20 DIAGNOSIS — I10 Essential (primary) hypertension: Secondary | ICD-10-CM | POA: Diagnosis not present

## 2023-08-21 ENCOUNTER — Telehealth: Payer: Self-pay

## 2023-08-21 ENCOUNTER — Other Ambulatory Visit (HOSPITAL_COMMUNITY): Payer: Self-pay

## 2023-08-21 ENCOUNTER — Other Ambulatory Visit: Payer: Self-pay

## 2023-08-21 NOTE — Telephone Encounter (Signed)
 Pharmacy Patient Advocate Encounter   Received notification from CoverMyMeds that prior authorization for Tresiba FlexTouch Pen U-100 is required/requested.   Insurance verification completed.   The patient is insured through [place] .   Per test claim: Refill too soon. PA is not needed at this time. Medication was filled 08/07/2023. Next eligible fill date is 10/03/2023.

## 2023-09-10 ENCOUNTER — Ambulatory Visit: Admitting: Family Medicine

## 2023-09-10 ENCOUNTER — Encounter: Payer: Self-pay | Admitting: Family Medicine

## 2023-09-10 VITALS — BP 124/52 | HR 61 | Temp 96.0°F | Ht 69.0 in | Wt 176.6 lb

## 2023-09-10 DIAGNOSIS — E1141 Type 2 diabetes mellitus with diabetic mononeuropathy: Secondary | ICD-10-CM | POA: Diagnosis not present

## 2023-09-10 DIAGNOSIS — M25511 Pain in right shoulder: Secondary | ICD-10-CM | POA: Diagnosis not present

## 2023-09-10 DIAGNOSIS — Z794 Long term (current) use of insulin: Secondary | ICD-10-CM

## 2023-09-10 DIAGNOSIS — M19011 Primary osteoarthritis, right shoulder: Secondary | ICD-10-CM | POA: Diagnosis not present

## 2023-09-10 MED ORDER — METHYLPREDNISOLONE ACETATE 40 MG/ML IJ SUSP
40.0000 mg | Freq: Once | INTRAMUSCULAR | Status: AC
  Administered 2023-09-10: 40 mg via INTRAMUSCULAR

## 2023-09-10 NOTE — Progress Notes (Signed)
 Subjective:  [name], male    DOB: 1940/11/24, 83 y.o.   MRN: [id number]  Patient Care Team: Joe Masters, FNP as PCP - General (Family Medicine) Joe Pippin, MD (Urology) Joe Boop, MD (Gastroenterology) Joe Rotunda, MD as Consulting Physician (Cardiology) Joe [name], [facility] (Pharmacist) Joe Copas, MD as Referring Physician (Optometry) Joe [name], [facility] as Pharmacist (Family Medicine)   Chief Complaint:  Shoulder Pain (Right x 3 months after a fall.  Patient has had PT and states it is no better. )   HPI: Joe [name] is a 83 y.o. male presenting on 09/10/2023 for Shoulder Pain (Right x 3 months after a fall.  Patient has had PT and states it is no better. )   Discussed the use of AI scribe software for clinical note transcription with the patient, who gave verbal consent to proceed.  History of Present Illness   Joe [name] is an 83 year old male with shoulder arthritis who presents with persistent shoulder pain. He is accompanied by his wife, Joe [name].  He has persistent right shoulder pain, which has been ongoing despite previous therapy. The pain is severe and limits the use of his right arm. It worsened after lifting heavy objects, such as cat litter and boxes of cat food, without assistance. An x-ray of the shoulder revealed progression of arthritis since images taken in 2015. He has previously undergone therapy, which provided some improvement but not significant relief.  He experiences neuropathy, particularly in his feet at night, which has been ongoing for several months. Wearing nanosocks at night provides some relief. He has been taking oral B12 supplements, but the exact dosage is unclear.          Relevant past medical, surgical, family, and social history reviewed and updated as indicated.  Allergies and medications reviewed and updated. Data reviewed: Chart in Epic.   Past Medical History:  Diagnosis Date    Adenomatous colon polyp    Arthritis    Cataract    Chest pain at rest 05/26/2016   Diabetes mellitus without complication (HCC)    Dyslipidemia    ESOPHAGITIS, REFLUX 07/09/2004   Qualifier: Diagnosis of  By: Vear Clock CMA Duncan Dull), Stephanie     Hiatal hernia    HOH (hard of hearing)    has bilateral Hearing aids   Hypertension    Irritable bowel syndrome    Kidney stone 1970   Pneumonia    Tick bites    took 5 rounds of Doxycycline this summer    Past Surgical History:  Procedure Laterality Date   COLONOSCOPY     ESOPHAGEAL DILATION  1996   ESOPHAGEAL DILATION  2003   FLEXIBLE SIGMOIDOSCOPY     HEMICOLECTOMY  08/2005   Right   KNEE SURGERY Right    TONSILLECTOMY      Social History   Socioeconomic History   Marital status: Married    Spouse name: Joe [name]   Number of children: 1   Years of education: 16   Highest education level: Bachelor's degree (e.g., BA, AB, BS)  Occupational History   Occupation: Retired Runner, broadcasting/film/video  Tobacco Use   Smoking status: Former    Current packs/day: 0.00    Average packs/day: 2.0 packs/day for 30.2 years (60.4 ttl pk-yrs)    Types: Cigarettes    Start date: 06/03/1954    Quit date: 08/24/1984    Years since quitting: 39.0   Smokeless tobacco:  Never   Tobacco comments:    Has not used to tobacco products for the past 20 years  Vaping Use   Vaping status: Never Used  Substance and Sexual Activity   Alcohol use: Yes    Alcohol/week: 7.0 standard drinks of alcohol    Types: 7 Standard drinks or equivalent per week    Comment: [place]bon at night when he sits on the deck   Drug use: No   Sexual activity: Yes    Partners: Female  Other Topics Concern   Not on file  Social History Narrative   Lives in [place] with wife, both retired teachers   1 son   3 caffeinated beverages daily   1 alcoholic beverage daily   Former smoker no current tobacco no drug use   Social Drivers of Corporate investment banker Strain: Low Risk  (04/04/2021)    Overall Financial Resource Strain (CARDIA)    Difficulty of Paying Living Expenses: Not hard at all  Food Insecurity: Low Risk  (06/30/2023)   Received from [facility]   Hunger Vital Sign    Worried About Running Out of Food in the Last Year: Never true    Ran Out of Food in the Last Year: Never true  Transportation Needs: No Transportation Needs (06/30/2023)   Received from [facility]    In the past 12 months, has lack of reliable transportation kept you from medical appointments, meetings, work or from getting things needed for daily living? : No  Physical Activity: Insufficiently Active (04/04/2021)   Exercise Vital Sign    Days of Exercise per Week: 3 days    Minutes of Exercise per Session: 20 min  Stress: No Stress Concern Present (04/04/2021)   [employer] of Occupational Health - Occupational Stress Questionnaire    Feeling of Stress : Not at all  Social Connections: Moderately Isolated (04/04/2021)   Social Connection and Isolation Panel [NHANES]    Frequency of Communication with Friends and Family: More than three times a week    Frequency of Social Gatherings with Friends and Family: Once a week    Attends Religious Services: Never    Database administrator or Organizations: No    Attends Banker Meetings: Never    Marital Status: Married  Catering manager Violence: Not At Risk (04/04/2021)   Humiliation, Afraid, Rape, and Kick questionnaire    Fear of Current or Ex-Partner: No    Emotionally Abused: No    Physically Abused: No    Sexually Abused: No    Outpatient Encounter Medications as of 09/10/2023  Medication Sig   Accu-Chek Softclix Lancets lancets CHECK BLOOD SUGAR 4 TIMES A DAY OR AS DIRECTED   aspirin 81 MG tablet Take 81 mg by mouth daily.   betamethasone 0.5% cream-vitamin a&d ointment 1:1 mixture Apply topically 2 (two) times daily as needed.   Blood Glucose Monitoring Suppl w/Device KIT Test BS BID and PRN dx E11.9.  Give test strips and lancets to match machine given   cholecalciferol (VITAMIN D) 1000 UNITS tablet Take 2,000 Units by mouth daily.   Cinnamon 500 MG TABS Take 1,000 mg by mouth in the morning and at bedtime.   Continuous Glucose Sensor (FREESTYLE LIBRE 2 SENSOR) MISC USE AS DIRECTED UP  TO  6  TIMES  DAILY, CHANGE EVERY 14 DAYS   doxylamine, Sleep, (UNISOM) 25 MG tablet Take 25 mg by mouth at bedtime as needed for sleep.  esomeprazole (NEXIUM) 40 MG capsule 1 capsule daily   glucose blood (ACCU-CHEK GUIDE) test strip Check blood sugar 4 times daily Dx E 11.40   insulin aspart (NOVOLOG FLEXPEN) 100 UNIT/ML FlexPen Inject 10-15 units in the morning before breakfast and before dinner as directed   insulin degludec (TRESIBA FLEXTOUCH) 100 UNIT/ML FlexTouch Pen Inject 22 Units into the skin at bedtime.   Insulin Pen Needle (PEN NEEDLES) 32G X 5 MM MISC 1 Device by Does not apply route daily.   JANUVIA 100 MG tablet TAKE ONE TABLET ONCE DAILY   JARDIANCE 25 MG TABS tablet TAKE ONE TABLET EVERY MORNING   MELATONIN PO Take by mouth.   metFORMIN (GLUCOPHAGE-XR) 500 MG 24 hr tablet TAKE TWO TABLETS TWICE DAILY WITH MEAL(S)   Multiple Vitamin (MULTI VITAMIN) TABS 1 tablet Orally Once a day for 30 day(s)   olmesartan (BENICAR) 20 MG tablet TAKE ONE TABLET DAILY   Probiotic Product (PROBIOTIC PO) Take 1 tablet by mouth daily.   simvastatin (ZOCOR) 40 MG tablet TAKE ONE TABLET DAILY EVERY EVENING   triamcinolone cream (KENALOG) 0.1 % Apply 1 Application topically 2 (two) times daily.   vitamin B-12 (CYANOCOBALAMIN) 100 MCG tablet Take 100 mcg by mouth daily. (Patient not taking: Reported on 09/10/2023)   No facility-administered encounter medications on file as of 09/10/2023.    Allergies  Allergen Reactions   Ace Inhibitors Cough   Trazodone Other (See Comments)   Trazamine [Trazodone & Diet Manage Prod] Other (See Comments)    nightmares    Pertinent ROS per HPI, otherwise unremarkable       Objective:  BP (!) 124/52   Pulse 61   Temp (!) 96 F (35.6 C)   Ht 5\' 9"  (1.753 m)   Wt 176 lb 9.6 oz (80.1 kg)   SpO2 99%   BMI 26.08 kg/m    Wt Readings from Last 3 Encounters:  09/10/23 176 lb 9.6 oz (80.1 kg)  08/07/23 175 lb (79.4 kg)  06/18/23 173 lb 6.4 oz (78.7 kg)    Physical Exam Vitals and nursing note reviewed.  Constitutional:      General: He is not in acute distress.    Appearance: Normal appearance. He is normal weight. He is not ill-appearing, toxic-appearing or diaphoretic.  HENT:     Head: Normocephalic and atraumatic.     Ears:     Comments: Bilateral hearing aids    Nose: Nose normal.     Mouth/Throat:     Mouth: Mucous membranes are moist.  Eyes:     Conjunctiva/sclera: Conjunctivae normal.     Pupils: Pupils are equal, round, and reactive to light.  Cardiovascular:     Rate and Rhythm: Normal rate and regular rhythm.     Pulses: Normal pulses.     Heart sounds: Normal heart sounds.  Pulmonary:     Effort: Pulmonary effort is normal.     Breath sounds: Normal breath sounds.  Musculoskeletal:     Right shoulder: Tenderness present. No swelling, deformity, effusion, laceration, bony tenderness or crepitus. Decreased range of motion. Decreased strength. Normal pulse.     Right upper arm: Normal.     Cervical back: Normal.     Right lower leg: No edema.     Left lower leg: No edema.  Skin:    General: Skin is warm and dry.     Capillary Refill: Capillary refill takes less than 2 seconds.  Neurological:     General: No focal deficit  present.     Mental Status: He is alert and oriented to person, place, and time.  Psychiatric:        Mood and Affect: Mood normal.        Behavior: Behavior normal.        Thought Content: Thought content normal.        Judgment: Judgment normal.    Joint Injection/Arthrocentesis  Date/Time: 09/10/2023 11:09 AM  Performed by: Joe Masters, FNP Authorized by: Joe Masters, FNP  Indications: joint  swelling, pain and diagnostic evaluation  Body area: shoulder Joint: right shoulder Local anesthesia used: yes  Anesthesia: Local anesthesia used: yes Local Anesthetic: co-phenylcaine spray  Sedation: Patient sedated: no  Preparation: Patient was prepped and draped in the usual sterile fashion. Needle size: 22 G Ultrasound guidance: no Approach: posterior Aspirate amount: 0 mL Methylprednisolone amount: 40 mg Lidocaine 2% amount: 3 mL Patient tolerance: patient tolerated the procedure well with no immediate complications       Results for orders placed or performed in visit on 08/07/23  Bayer DCA Hb A1c Waived   Collection Time: 08/07/23  8:43 AM  Result Value Ref Range   HB A1C (BAYER DCA - WAIVED) 8.0 (H) 4.8 - 5.6 %       Pertinent labs & imaging results that were available during my care of the patient were reviewed by me and considered in my medical decision making.  Assessment & Plan:  Jefferson was seen today for shoulder pain.  Diagnoses and all orders for this visit:  Chronic right shoulder pain -     Joint Injection/Arthrocentesis  Primary osteoarthritis of right shoulder -     Joint Injection/Arthrocentesis  Diabetic mononeuropathy associated with type 2 diabetes mellitus (HCC) Aware to restart Vit B12 supplements. Report worsening symptoms.     Assessment and Plan    Shoulder Osteoarthritis Chronic right shoulder pain due to progressive osteoarthritis, confirmed by imaging since 2015. Physical therapy provided some relief but was insufficient. He prefers joint injections over surgical intervention. Discussed steroid injection with potential side effects including bleeding, bruising, increased pain, and infection. Advised that increased pain may occur for 1-2 days post-injection with expected significant improvement by day 2-3. Emphasized resting the shoulder and accepting assistance with heavy lifting to prevent further injury. - Administer steroid  injection in the right shoulder joint today. - Advise resting the shoulder and accepting assistance with heavy lifting.  Peripheral Neuropathy Chronic neuropathy, particularly affecting feet at night. Symptoms may be related to B12 levels. He uses nanosocks and topical Nervive for symptom relief. Discussed potential benefit of Nervive pills for additional management of neuropathy symptoms. - Continue using nanosocks at night. - Discuss with Raynelle Fanning about obtaining Nervive pills for additional management.  Vitamin B12 Supplementation He has been taking oral B12 supplements daily, resulting in high normal B12 levels. Advised to reduce frequency to prevent excessively high levels. Discussed that B12 levels typically rise above 2000-3000 after an injection, and adjusting oral intake to every other day or 2-3 times a week should maintain adequate levels without excess. - Adjust B12 supplementation to every other day or 2-3 times a week. - Monitor for improvement in neuropathy symptoms with adjusted B12 intake.          Continue all other maintenance medications.  Follow up plan: Return in 2 weeks (on 09/24/2023), or if symptoms worsen or fail to improve, for shoulder pain.   Continue healthy lifestyle choices, including diet (rich in fruits, vegetables,  and lean proteins, and low in salt and simple carbohydrates) and exercise (at least 30 minutes of moderate physical activity daily).  Educational handout given for joint injection   The above assessment and management plan was discussed with the patient. The patient verbalized understanding of and has agreed to the management plan. Patient is aware to call the clinic if they develop any new symptoms or if symptoms persist or worsen. Patient is aware when to return to the clinic for a follow-up visit. Patient educated on when it is appropriate to go to the emergency department.   Kari Baars, FNP-C [facility] [phone]

## 2023-09-10 NOTE — Addendum Note (Signed)
 Addended by: Lorelee Cover C on: 09/10/2023 01:30 PM   Modules accepted: Orders

## 2023-09-11 ENCOUNTER — Ambulatory Visit

## 2023-09-23 DIAGNOSIS — M79674 Pain in right toe(s): Secondary | ICD-10-CM | POA: Diagnosis not present

## 2023-09-23 DIAGNOSIS — L84 Corns and callosities: Secondary | ICD-10-CM | POA: Diagnosis not present

## 2023-09-23 DIAGNOSIS — M79675 Pain in left toe(s): Secondary | ICD-10-CM | POA: Diagnosis not present

## 2023-09-23 DIAGNOSIS — E1142 Type 2 diabetes mellitus with diabetic polyneuropathy: Secondary | ICD-10-CM | POA: Diagnosis not present

## 2023-09-23 DIAGNOSIS — B351 Tinea unguium: Secondary | ICD-10-CM | POA: Diagnosis not present

## 2023-09-25 ENCOUNTER — Ambulatory Visit: Admitting: Pharmacist

## 2023-09-25 DIAGNOSIS — E119 Type 2 diabetes mellitus without complications: Secondary | ICD-10-CM | POA: Diagnosis not present

## 2023-09-25 DIAGNOSIS — Z794 Long term (current) use of insulin: Secondary | ICD-10-CM

## 2023-09-25 NOTE — Progress Notes (Signed)
 09/25/2023 Name: Joe [name] MRN: [id number] DOB: Aug 11, 1940  Chief Complaint  Patient presents with   Diabetes    Joe [name] is a 83 y.o. year old male who was referred for medication management by their primary care provider, Rakes, Georgeann Kindred, FNP. They presented for a face to face visit today.   They were referred to the pharmacist by their PCP for assistance in managing diabetes and complex medication management    Subjective:  Care Team: Primary Care Provider: Galvin Jules, FNP    Medication Access/Adherence  Current Pharmacy:  [name] [place], [place] - [address] [place] [address] Phone: [phone] Fax: [phone]  [name] [address] [place] - [address] [place] [address] Phone: [phone] Fax: [phone]  Patient reports affordability concerns with their medications: No  Patient reports access/transportation concerns to their pharmacy: No  Patient reports adherence concerns with their medications:  No     Diabetes:  Current medications: Tresiba , novolog , jardiance , januvia  Medications tried in the past: fiasp   Current glucose readings: per below Using FSL2/phone app  Date of Download: 09/25/23 % Time CGM is active: 88% Average Glucose: 206 mg/dL Glucose Management Indicator: 82  Glucose Variability: 40.7 (goal <36%) Time in Goal:  - Time in range 70-180: 42% - Time above range: 58% - Time below range: 0% Observed patterns:   Patient reports ONE episode of hypoglycemic (usually does not feel, but BG was in the 50s) s/sx including dizziness, shakiness, sweating. Patient denies hyperglycemic symptoms including polyuria, polydipsia, polyphagia, nocturia, neuropathy, blurred vision.  Current meal patterns:  Reports 3 meals daily Heavier on breakfast and dinner Discussed meal planning options and Plate method for healthy eating Avoid sugary drinks and  desserts Incorporate balanced protein, non starchy veggies, 1 serving of carbohydrate with each meal Increase water intake Increase physical activity as able  Current physical activity: encouraged as able  Current medication access support: humana   Objective:  Lab Results  Component Value Date   HGBA1C 8.0 (H) 08/07/2023    Lab Results  Component Value Date   CREATININE 1.40 (H) 01/14/2023   BUN 25 01/14/2023   NA 136 01/14/2023   K 5.0 01/14/2023   CL 101 01/14/2023   CO2 22 01/14/2023    Lab Results  Component Value Date   CHOL 129 01/14/2023   HDL 51 01/14/2023   LDLCALC 59 01/14/2023   TRIG 104 01/14/2023   CHOLHDL 2.5 01/14/2023    Medications Reviewed Today     Reviewed by Joe [name], Joe [name] (Pharmacist) on 09/25/23 at 1309  Med List Status: <None>   Medication Order Taking? Sig Documenting Provider Last Dose Status Informant  Accu-Chek Softclix Lancets lancets [id number] No CHECK BLOOD SUGAR 4 TIMES A DAY OR AS DIRECTED [provider] Taking Active   aspirin  81 MG tablet [id number] No Take 81 mg by mouth daily. [provider] Taking Active Spouse/Significant Other  betamethasone  0.5% cream-vitamin a&d ointment 1:1 mixture [id number] No Apply topically 2 (two) times daily as needed. Joe Jules, FNP Taking Active   Blood Glucose Monitoring Suppl w/Device KIT [id number] No Test BS BID and PRN dx E11.9. Give test strips and lancets to match machine given Joe Ned, MD Taking Active   cholecalciferol (VITAMIN D ) 1000 UNITS tablet [id number] No Take 2,000 Units by mouth daily. [provider] Taking Active Spouse/Significant Other  Cinnamon 500 MG TABS [id number] No Take 1,000 mg by mouth in the morning and at bedtime. [provider] Taking Active Spouse/Significant Other  Continuous Glucose Sensor (FREESTYLE LIBRE 2 SENSOR) MISC [id number] No USE AS DIRECTED UP  TO  6  TIMES  DAILY, CHANGE EVERY 14 DAYS Rakes, Georgeann Kindred, FNP  Taking Active   doxylamine, Sleep, (UNISOM) 25 MG tablet [id number] No Take 25 mg by mouth at bedtime as needed for sleep.  [provider] Taking Active Spouse/Significant Other  esomeprazole  (NEXIUM ) 40 MG capsule [id number] No 1 capsule daily Rakes, Georgeann Kindred, FNP Taking Active   glucose blood (ACCU-CHEK GUIDE) test strip [id number] No Check blood sugar 4 times daily Dx E 11.40 Rakes, Linda M, FNP Taking Active   insulin  aspart (NOVOLOG  FLEXPEN) 100 UNIT/ML FlexPen [id number] No Inject 10-15 units in the morning before breakfast and before dinner as directed Joe Jules, FNP Taking Active   insulin  degludec (TRESIBA  FLEXTOUCH) 100 UNIT/ML FlexTouch Pen [id number] No Inject 22 Units into the skin at bedtime. Joe Jules, FNP Taking Active   Insulin  Pen Needle (PEN NEEDLES) 32G X 5 MM MISC [id number] No 1 Device by Does not apply route daily. Joe Jules, FNP Taking Active   JANUVIA  100 MG tablet [id number] No TAKE ONE TABLET ONCE DAILY Joe Childes Georgeann Kindred, FNP Taking Active   JARDIANCE  25 MG TABS tablet [id number] No TAKE ONE TABLET EVERY MORNING Rakes, Linda M, FNP Taking Active   MELATONIN PO [id number] No Take by mouth. [provider] Taking Active   metFORMIN  (GLUCOPHAGE -XR) 500 MG 24 hr tablet [id number] No TAKE TWO TABLETS TWICE DAILY WITH MEAL(S) Rakes, Georgeann Kindred, FNP Taking Active   Multiple Vitamin (MULTI VITAMIN) TABS [id number] No 1 tablet Orally Once a day for 30 day(s) [provider] Taking Active   olmesartan  (BENICAR ) 20 MG tablet [id number] No TAKE ONE TABLET DAILY Rakes, Georgeann Kindred, FNP Taking Active   Probiotic Product (PROBIOTIC PO) [id number] No Take 1 tablet by mouth daily. [provider] Taking Active Spouse/Significant Other  simvastatin  (ZOCOR ) 40 MG tablet [id number] No TAKE ONE TABLET DAILY EVERY EVENING Rakes, Georgeann Kindred, FNP Taking Active   triamcinolone  cream (KENALOG ) 0.1 % [id number] No Apply 1 Application topically 2 (two) times daily. Joe Jules, FNP Taking Active   vitamin B-12 (CYANOCOBALAMIN ) 100 MCG tablet [id number] No Take 100 mcg by mouth daily.  Patient not taking: Reported on 09/10/2023   [provider] Not Taking Active               Assessment/Plan:   A/P: Diabetes longstanding currently above goal based on A1c (8.3%). Patient is able to verbalize appropriate hypoglycemia management plan. Medication adherence appears appropriate. Post prandial excursion seen on CGM data. No hypoglycemic events noted.  -Ensure CGM sensor place in the subcutaneous tissue (move to back on arm in fatty/subcutaneous--past sensors where near muscle/front of arm) -Continue basal insulin  Tresiba  20-22 units nightly -Adjusted dose of rapid insulin  Novolog  to 10-15 units PRIOR to breakfast and 10-15 units PRIOR to lunch given breakfast is his largest meal of the day. START Novolog  5-10 units at dinner.  Patient has not been giving meal time/rapid insulin  prior to meals.  This will help aid in preventing post prandial spikes. -high protein snack at bedtime to avoid hypoglycemia -Continued SGLT2-I Jardiance  25 mg daily. Counseled on sick day rules. - DECREASE METFORMIN  1000 mg DAILY -Continued Januvia  100 mg daily. Respectfully declines GLP1 therapy. Watch renal function  with GFR 50 -discussed potential referral to endocrinology for insulin  pump or device to aid in steady blood sugar levels -Patient educated on purpose, proper use, and potential adverse effects of insulin .  -Extensively discussed pathophysiology of diabetes, recommended lifestyle interventions, dietary effects on blood sugar control.  -Counseled on s/sx of and management of hypoglycemia.  -Next A1c anticipated next week   Follow Up Plan: 4 weeks.    Geovanna Simko Dattero Mariacristina Aday, PharmD, BCACP, CPP Clinical Pharmacist, [facility]

## 2023-11-05 ENCOUNTER — Telehealth: Payer: Self-pay | Admitting: Pharmacist

## 2023-11-05 DIAGNOSIS — E1159 Type 2 diabetes mellitus with other circulatory complications: Secondary | ICD-10-CM

## 2023-11-05 MED ORDER — OLMESARTAN MEDOXOMIL 20 MG PO TABS: 20.0000 mg | ORAL_TABLET | Freq: Every day | ORAL | 0 refills | Status: DC

## 2023-11-05 NOTE — Telephone Encounter (Signed)
   This patient is appearing on a report for being at risk of failing the adherence measure for hypertension (ACEi/ARB) medications this calendar year.   Medication: olmesartan  Last fill date: 06/16/23 for 90 day supply  Will collaborate with provider to facilitate refill needs.Called patient to check on refill status and blood pressure. Refills of olmesartan  send to [facility]. Reviewed labs.   Shelaine Frie Dattero Keshana Klemz, PharmD, BCACP, CPP Clinical Pharmacist, [facility]

## 2023-11-11 ENCOUNTER — Ambulatory Visit: Admitting: Family Medicine

## 2023-11-12 DIAGNOSIS — H6123 Impacted cerumen, bilateral: Secondary | ICD-10-CM | POA: Diagnosis not present

## 2023-11-13 ENCOUNTER — Ambulatory Visit: Payer: Self-pay | Admitting: Family Medicine

## 2023-11-13 ENCOUNTER — Encounter: Payer: Self-pay | Admitting: Family Medicine

## 2023-11-13 ENCOUNTER — Ambulatory Visit: Admitting: Family Medicine

## 2023-11-13 VITALS — BP 111/63 | HR 66 | Temp 97.8°F | Ht 69.0 in | Wt 174.4 lb

## 2023-11-13 DIAGNOSIS — E1122 Type 2 diabetes mellitus with diabetic chronic kidney disease: Secondary | ICD-10-CM

## 2023-11-13 DIAGNOSIS — E1159 Type 2 diabetes mellitus with other circulatory complications: Secondary | ICD-10-CM

## 2023-11-13 DIAGNOSIS — G8929 Other chronic pain: Secondary | ICD-10-CM

## 2023-11-13 DIAGNOSIS — N183 Chronic kidney disease, stage 3 unspecified: Secondary | ICD-10-CM

## 2023-11-13 DIAGNOSIS — E1140 Type 2 diabetes mellitus with diabetic neuropathy, unspecified: Secondary | ICD-10-CM

## 2023-11-13 DIAGNOSIS — Z794 Long term (current) use of insulin: Secondary | ICD-10-CM

## 2023-11-13 DIAGNOSIS — E785 Hyperlipidemia, unspecified: Secondary | ICD-10-CM

## 2023-11-13 DIAGNOSIS — M25561 Pain in right knee: Secondary | ICD-10-CM | POA: Diagnosis not present

## 2023-11-13 DIAGNOSIS — I152 Hypertension secondary to endocrine disorders: Secondary | ICD-10-CM

## 2023-11-13 DIAGNOSIS — E1169 Type 2 diabetes mellitus with other specified complication: Secondary | ICD-10-CM | POA: Diagnosis not present

## 2023-11-13 LAB — BAYER DCA HB A1C WAIVED: HB A1C (BAYER DCA - WAIVED): 8.1 % — ABNORMAL HIGH (ref 4.8–5.6)

## 2023-11-13 MED ORDER — FREESTYLE LIBRE 3 PLUS SENSOR MISC: 11 refills | Status: AC

## 2023-11-13 NOTE — Progress Notes (Signed)
 Subjective:  [name], male    DOB: 09-Jan-1941, 83 y.o.   MRN: [id number]  Patient Care Team: Galvin Jules, FNP as PCP - General (Family Medicine) Homero Luster, MD (Urology) Kenney Peacemaker, MD (Gastroenterology) Eilleen Grates, MD as Consulting Physician (Cardiology) Delilah Fend, [facility] (Pharmacist) Hyland Mailman, MD as Referring Physician (Optometry) Delilah Fend, [facility] as Pharmacist (Family Medicine)   Chief Complaint:  Diabetes (3 month follow up ) and Knee Pain (Left x 5 months)   HPI: Joe [name] is a 83 y.o. male presenting on 11/13/2023 for Diabetes (3 month follow up ) and Knee Pain (Left x 5 months)   Joe [name] is an 83 year old male with diabetes who presents with fluctuating blood sugar levels and knee pain.  He experiences fluctuating blood sugar levels, describing them as 'up and down'. His blood sugar tends to be higher in the mornings following high readings the previous night, with specific examples including a reading of 269 in the evening followed by 225 the next morning, and 232 in the evening followed by 140 the next morning. He mentions occasional low blood sugar at night, which he compensates for by eating, sometimes resulting in higher readings the following morning. No significant symptoms are associated with these fluctuations, though he occasionally feels 'a little shaky' with low readings. His current medication regimen includes metformin  500 mg twice daily, Tresiba , and Novolog .  He reports knee pain, which has worsened over the past six months following a fall. The pain is localized to the right knee, and he wears a knee brace which provides some relief. The pain worsened after a recent minor fall three weeks ago. No significant swelling or instability is noted, but there is some discomfort. He is not currently attending therapy for his knee.  He denies any side effects from his medications. He is currently taking azilsartan  for blood pressure management, which also aids in kidney protection. He is on Jardiance  for kidney protection as well.          Relevant past medical, surgical, family, and social history reviewed and updated as indicated.  Allergies and medications reviewed and updated. Data reviewed: Chart in Epic.   Past Medical History:  Diagnosis Date   Adenomatous colon polyp    Arthritis    Cataract    Chest pain at rest 05/26/2016   Diabetes mellitus without complication (HCC)    Dyslipidemia    ESOPHAGITIS, REFLUX 07/09/2004   Qualifier: Diagnosis of  By: Valda Garnet CMA Nonie Beady), Stephanie     Hiatal hernia    HOH (hard of hearing)    has bilateral Hearing aids   Hypertension    Irritable bowel syndrome    Kidney stone 1970   Pneumonia    Tick bites    took 5 rounds of Doxycycline  this summer    Past Surgical History:  Procedure Laterality Date   COLONOSCOPY     ESOPHAGEAL DILATION  1996   ESOPHAGEAL DILATION  2003   FLEXIBLE SIGMOIDOSCOPY     HEMICOLECTOMY  08/2005   Right   KNEE SURGERY Right    TONSILLECTOMY      Social History   Socioeconomic History   Marital status: Married    Spouse name: Sallyanne Creamer   Number of children: 1   Years of education: 16   Highest education level: Bachelor's degree (e.g., BA, AB, BS)  Occupational History   Occupation: Retired Runner, broadcasting/film/video  Tobacco Use   Smoking status: Former    Current packs/day: 0.00    Average packs/day: 2.0 packs/day for 30.2 years (60.4 ttl pk-yrs)    Types: Cigarettes    Start date: 06/03/1954    Quit date: 08/24/1984    Years since quitting: 39.2   Smokeless tobacco: Never   Tobacco comments:    Has not used to tobacco products for the past 20 years  Vaping Use   Vaping status: Never Used  Substance and Sexual Activity   Alcohol use: Yes    Alcohol/week: 7.0 standard drinks of alcohol    Types: 7 Standard drinks or equivalent per week    Comment: Bourbon at night when he sits on the deck   Drug use: No   Sexual  activity: Yes    Partners: Female  Other Topics Concern   Not on file  Social History Narrative   Lives in [place] with wife, both retired teachers   1 son   3 caffeinated beverages daily   1 alcoholic beverage daily   Former smoker no current tobacco no drug use   Social Drivers of Corporate investment banker Strain: Low Risk  (04/04/2021)   Overall Financial Resource Strain (CARDIA)    Difficulty of Paying Living Expenses: Not hard at all  Food Insecurity: Low Risk  (06/30/2023)   Received from [facility]   Hunger Vital Sign    Within the past 12 months, you worried that your food would run out before you got money to buy more: Never true    Within the past 12 months, the food you bought just didn't last and you didn't have money to get more. : Never true  Transportation Needs: No Transportation Needs (06/30/2023)   Received from [facility]    In the past 12 months, has lack of reliable transportation kept you from medical appointments, meetings, work or from getting things needed for daily living? : No  Physical Activity: Insufficiently Active (04/04/2021)   Exercise Vital Sign    Days of Exercise per Week: 3 days    Minutes of Exercise per Session: 20 min  Stress: No Stress Concern Present (04/04/2021)   [employer] of Occupational Health - Occupational Stress Questionnaire    Feeling of Stress : Not at all  Social Connections: Moderately Isolated (04/04/2021)   Social Connection and Isolation Panel    Frequency of Communication with Friends and Family: More than three times a week    Frequency of Social Gatherings with Friends and Family: Once a week    Attends Religious Services: Never    Database administrator or Organizations: No    Attends Banker Meetings: Never    Marital Status: Married  Catering manager Violence: Not At Risk (04/04/2021)   Humiliation, Afraid, Rape, and Kick questionnaire    Fear of Current or Ex-Partner: No     Emotionally Abused: No    Physically Abused: No    Sexually Abused: No    Outpatient Encounter Medications as of 11/13/2023  Medication Sig   Accu-Chek Softclix Lancets lancets CHECK BLOOD SUGAR 4 TIMES A DAY OR AS DIRECTED   aspirin  81 MG tablet Take 81 mg by mouth daily.   betamethasone  0.5% cream-vitamin a&d ointment 1:1 mixture Apply topically 2 (two) times daily as needed.   Blood Glucose Monitoring Suppl w/Device KIT Test BS BID and PRN dx E11.9. Give test strips and lancets to match machine given  cholecalciferol (VITAMIN D ) 1000 UNITS tablet Take 2,000 Units by mouth daily.   Cinnamon 500 MG TABS Take 1,000 mg by mouth in the morning and at bedtime.   Continuous Glucose Sensor (FREESTYLE LIBRE 3 PLUS SENSOR) MISC Change sensor every 15 days.   doxylamine, Sleep, (UNISOM) 25 MG tablet Take 25 mg by mouth at bedtime as needed for sleep.    esomeprazole  (NEXIUM ) 40 MG capsule 1 capsule daily   glucose blood (ACCU-CHEK GUIDE) test strip Check blood sugar 4 times daily Dx E 11.40   insulin  aspart (NOVOLOG  FLEXPEN) 100 UNIT/ML FlexPen Inject 10-15 units in the morning before breakfast and before dinner as directed   insulin  degludec (TRESIBA  FLEXTOUCH) 100 UNIT/ML FlexTouch Pen Inject 22 Units into the skin at bedtime.   Insulin  Pen Needle (PEN NEEDLES) 32G X 5 MM MISC 1 Device by Does not apply route daily.   JANUVIA  100 MG tablet TAKE ONE TABLET ONCE DAILY   JARDIANCE  25 MG TABS tablet TAKE ONE TABLET EVERY MORNING   MELATONIN PO Take by mouth.   metFORMIN  (GLUCOPHAGE -XR) 500 MG 24 hr tablet TAKE TWO TABLETS TWICE DAILY WITH MEAL(S) (Patient taking differently: 500 mg in the morning and at bedtime.)   Multiple Vitamin (MULTI VITAMIN) TABS 1 tablet Orally Once a day for 30 day(s)   olmesartan  (BENICAR ) 20 MG tablet Take 1 tablet (20 mg total) by mouth daily.   Probiotic Product (PROBIOTIC PO) Take 1 tablet by mouth daily.   simvastatin  (ZOCOR ) 40 MG tablet TAKE ONE TABLET DAILY  EVERY EVENING   triamcinolone  cream (KENALOG ) 0.1 % Apply 1 Application topically 2 (two) times daily.   [DISCONTINUED] Continuous Glucose Sensor (FREESTYLE LIBRE 2 SENSOR) MISC USE AS DIRECTED UP  TO  6  TIMES  DAILY, CHANGE EVERY 14 DAYS   vitamin B-12 (CYANOCOBALAMIN ) 100 MCG tablet Take 100 mcg by mouth daily. (Patient not taking: Reported on 09/10/2023)   No facility-administered encounter medications on file as of 11/13/2023.    Allergies  Allergen Reactions   Ace Inhibitors Cough   Trazodone Other (See Comments)   Trazamine [Trazodone & Diet Manage Prod] Other (See Comments)    nightmares    Pertinent ROS per HPI, otherwise unremarkable      Objective:  BP 111/63   Pulse 66   Temp 97.8 F (36.6 C)   Ht 5' 9 (1.753 m)   Wt 174 lb 6.4 oz (79.1 kg)   SpO2 96%   BMI 25.75 kg/m    Wt Readings from Last 3 Encounters:  11/13/23 174 lb 6.4 oz (79.1 kg)  09/10/23 176 lb 9.6 oz (80.1 kg)  08/07/23 175 lb (79.4 kg)    Physical Exam Vitals and nursing note reviewed.  Constitutional:      General: He is not in acute distress.    Appearance: Normal appearance. He is not ill-appearing, toxic-appearing or diaphoretic.  HENT:     Head: Normocephalic and atraumatic.     Ears:     Comments: Bilateral hearing aids    Nose: Nose normal.     Mouth/Throat:     Mouth: Mucous membranes are moist.   Eyes:     Conjunctiva/sclera: Conjunctivae normal.     Pupils: Pupils are equal, round, and reactive to light.    Cardiovascular:     Rate and Rhythm: Normal rate and regular rhythm.     Heart sounds: Normal heart sounds.  Pulmonary:     Effort: Pulmonary effort is normal.  Breath sounds: Normal breath sounds.   Musculoskeletal:     Right upper leg: Normal.     Right knee: No swelling, deformity, effusion, erythema, ecchymosis, lacerations, bony tenderness or crepitus. Normal range of motion. No tenderness. LCL laxity present. No MCL laxity, ACL laxity or PCL laxity. Normal  alignment, normal meniscus and normal patellar mobility. Normal pulse.     Instability Tests: Anterior drawer test negative. Posterior drawer test negative. Anterior Lachman test negative. Medial McMurray test negative.     Right lower leg: Normal.   Neurological:     Mental Status: He is alert.     Results for orders placed or performed in visit on 08/07/23  Bayer DCA Hb A1c Waived   Collection Time: 08/07/23  8:43 AM  Result Value Ref Range   HB A1C (BAYER DCA - WAIVED) 8.0 (H) 4.8 - 5.6 %       Pertinent labs & imaging results that were available during my care of the patient were reviewed by me and considered in my medical decision making.  Assessment & Plan:  Jauan was seen today for diabetes and knee pain.  Diagnoses and all orders for this visit:  Type 2 diabetes mellitus with diabetic neuropathy, with long-term current use of insulin  (HCC) -     Bayer DCA Hb A1c Waived -     Continuous Glucose Sensor (FREESTYLE LIBRE 3 PLUS SENSOR) MISC; Change sensor every 15 days.  Hyperlipidemia associated with type 2 diabetes mellitus (HCC) -     Bayer DCA Hb A1c Waived  Hypertension associated with diabetes (HCC) -     Bayer DCA Hb A1c Waived  CKD stage 3 due to type 2 diabetes mellitus (HCC) -     Bayer DCA Hb A1c Waived  Chronic pain of right knee -     DG Knee 1-2 Views Right      Diabetes Mellitus Type 2 Blood glucose levels are labile, with fluctuations between high and low readings. HbA1c is 8.1%, requiring monitoring to prevent further elevation. Experiences occasional shakiness with low blood sugar levels but no severe symptoms. Current treatment includes metformin , Tresiba , and Novolog . Considering transitioning to an insulin  pump to improve glucose control. The goal is to maintain HbA1c around 7-8% to avoid significant lows, with a preference for slightly higher levels to prevent hypoglycemia. Ninety percent of diabetics in similar conditions are at CKD stage 3A. -  Continue metformin  500 mg extended release twice daily - Continue Tresiba  and Novolog  as prescribed - Consider insulin  pump if approved - Schedule follow-up with endocrinologist for insulin  pump evaluation  Chronic Kidney Disease Stage 3A CKD stage 3A is managed with Jardiance  and olmesartan  to protect kidney function. Kidney function is monitored regularly. - Continue Jardiance  and olmesartan  - Monitor kidney function regularly  Hypertension Blood pressure is well-controlled with current medication regimen. No side effects from medications reported. Olmesartan  also provides renal protection. - Continue current antihypertensive regimen including olmesartan   Right Knee Pain Reports worsening right knee pain following a fall six months ago, with recent exacerbation after another fall three weeks ago. Some laxity in the ligament on the outside of the knee suggests a possible strain or partial tear. Prefers to avoid surgery and has decided to live with the condition unless it worsens significantly. - Order right knee x-ray to assess for further injury - Continue RICE (rest, ice, compression, elevation) therapy - Consider corticosteroid injections if pain worsens or affects mobility  General Health Maintenance Plan to transition to  the [place] 3 system for continuous glucose monitoring, which will provide more convenience and accuracy. - Transition to [place] 3 continuous glucose monitoring system - Educate on new Libre 3 app for glucose monitoring          Continue all other maintenance medications.  Follow up plan: Return in 3 months (on 02/13/2024), or if symptoms worsen or fail to improve, for chronic follow up.   Continue healthy lifestyle choices, including diet (rich in fruits, vegetables, and lean proteins, and low in salt and simple carbohydrates) and exercise (at least 30 minutes of moderate physical activity daily).  Educational handout given for DM  The above assessment and  management plan was discussed with the patient. The patient verbalized understanding of and has agreed to the management plan. Patient is aware to call the clinic if they develop any new symptoms or if symptoms persist or worsen. Patient is aware when to return to the clinic for a follow-up visit. Patient educated on when it is appropriate to go to the emergency department.   Kattie Parrot, FNP-C [facility] [phone]

## 2023-11-13 NOTE — Patient Instructions (Addendum)
 Goal Blood glucose:    Fasting (before meals) = 80 to 130   Within 2 hours of eating = less than 180   Understanding your Hemoglobin A1c: 8.1     Diabetes Mellitus and Nutrition    I think that you would greatly benefit from seeing a nutritionist. If this is something you are interested in, please call Dr Florinda Hush at [phone] to schedule an appointment.   When you have diabetes (diabetes mellitus), it is very important to have healthy eating habits because your blood sugar (glucose) levels are greatly affected by what you eat and drink. Eating healthy foods in the appropriate amounts, at about the same times every day, can help you: Control your blood glucose. Lower your risk of heart disease. Improve your blood pressure. Reach or maintain a healthy weight.  Every person with diabetes is different, and each person has different needs for a meal plan. Your health care provider may recommend that you work with a diet and nutrition specialist (dietitian) to make a meal plan that is best for you. Your meal plan may vary depending on factors such as: The calories you need. The medicines you take. Your weight. Your blood glucose, blood pressure, and cholesterol levels. Your activity level. Other health conditions you have, such as heart or kidney disease.  How do carbohydrates affect me? Carbohydrates affect your blood glucose level more than any other type of food. Eating carbohydrates naturally increases the amount of glucose in your blood. Carbohydrate counting is a method for keeping track of how many carbohydrates you eat. Counting carbohydrates is important to keep your blood glucose at a healthy level, especially if you use insulin  or take certain oral diabetes medicines. It is important to know how many carbohydrates you can safely have in each meal. This is different for every person. Your dietitian can help you calculate how many carbohydrates you should have at each meal and  for snack. Foods that contain carbohydrates include: Bread, cereal, rice, pasta, and crackers. Potatoes and corn. Peas, beans, and lentils. Milk and yogurt. Fruit and juice. Desserts, such as cakes, cookies, ice cream, and candy.  How does alcohol affect me? Alcohol can cause a sudden decrease in blood glucose (hypoglycemia), especially if you use insulin  or take certain oral diabetes medicines. Hypoglycemia can be a life-threatening condition. Symptoms of hypoglycemia (sleepiness, dizziness, and confusion) are similar to symptoms of having too much alcohol. If your health care provider says that alcohol is safe for you, follow these guidelines: Limit alcohol intake to no more than 1 drink per day for nonpregnant women and 2 drinks per day for men. One drink equals 12 oz of beer, 5 oz of wine, or 1 oz of hard liquor. Do not drink on an empty stomach. Keep yourself hydrated with water, diet soda, or unsweetened iced tea. Keep in mind that regular soda, juice, and other mixers may contain a lot of sugar and must be counted as carbohydrates.  What are tips for following this plan?  Reading food labels Start by checking the serving size on the label. The amount of calories, carbohydrates, fats, and other nutrients listed on the label are based on one serving of the food. Many foods contain more than one serving per package. Check the total grams (g) of carbohydrates in one serving. You can calculate the number of servings of carbohydrates in one serving by dividing the total carbohydrates by 15. For example, if a food has 30 g of total carbohydrates, it  would be equal to 2 servings of carbohydrates. Check the number of grams (g) of saturated and trans fats in one serving. Choose foods that have low or no amount of these fats. Check the number of milligrams (mg) of sodium in one serving. Most people should limit total sodium intake to less than 2,300 mg per day. Always check the nutrition  information of foods labeled as low-fat or nonfat. These foods may be higher in added sugar or refined carbohydrates and should be avoided. Talk to your dietitian to identify your daily goals for nutrients listed on the label.  Shopping Avoid buying canned, premade, or processed foods. These foods tend to be high in fat, sodium, and added sugar. Shop around the outside edge of the grocery store. This includes fresh fruits and vegetables, bulk grains, fresh meats, and fresh dairy.  Cooking Use low-heat cooking methods, such as baking, instead of high-heat cooking methods like deep frying. Cook using healthy oils, such as olive, canola, or sunflower oil. Avoid cooking with butter, cream, or high-fat meats.  Meal planning Eat meals and snacks regularly, preferably at the same times every day. Avoid going long periods of time without eating. Eat foods high in fiber, such as fresh fruits, vegetables, beans, and whole grains. Talk to your dietitian about how many servings of carbohydrates you can eat at each meal. Eat 4-6 ounces of lean protein each day, such as lean meat, chicken, fish, eggs, or tofu. 1 ounce is equal to 1 ounce of meat, chicken, or fish, 1 egg, or 1/4 cup of tofu. Eat some foods each day that contain healthy fats, such as avocado, nuts, seeds, and fish.  Lifestyle  Check your blood glucose regularly. Exercise at least 30 minutes 5 or more days each week, or as told by your health care provider. Take medicines as told by your health care provider. Do not use any products that contain nicotine or tobacco, such as cigarettes and e-cigarettes. If you need help quitting, ask your health care provider. Work with a Veterinary surgeon or diabetes educator to identify strategies to manage stress and any emotional and social challenges.  What are some questions to ask my health care provider? Do I need to meet with a diabetes educator? Do I need to meet with a dietitian? What number can I  call if I have questions? When are the best times to check my blood glucose?  Where to find more information: American Diabetes Association: diabetes.org/food-and-fitness/food Academy of Nutrition and Dietetics: https://www.vargas.com/ [employer] of Diabetes and Digestive and Kidney Diseases (NIH): FindJewelers.cz  Summary A healthy meal plan will help you control your blood glucose and maintain a healthy lifestyle. Working with a diet and nutrition specialist (dietitian) can help you make a meal plan that is best for you. Keep in mind that carbohydrates and alcohol have immediate effects on your blood glucose levels. It is important to count carbohydrates and to use alcohol carefully. This information is not intended to replace advice given to you by your health care provider. Make sure you discuss any questions you have with your health care provider. Document Released: 02/14/2005 Document Revised: 06/24/2016 Document Reviewed: 06/24/2016 Elsevier Interactive Patient Education  [employer].

## 2023-11-17 ENCOUNTER — Ambulatory Visit (INDEPENDENT_AMBULATORY_CARE_PROVIDER_SITE_OTHER)

## 2023-11-17 DIAGNOSIS — M25561 Pain in right knee: Secondary | ICD-10-CM

## 2023-11-17 DIAGNOSIS — M25562 Pain in left knee: Secondary | ICD-10-CM | POA: Diagnosis not present

## 2023-11-17 DIAGNOSIS — G8929 Other chronic pain: Secondary | ICD-10-CM

## 2023-11-17 DIAGNOSIS — M1711 Unilateral primary osteoarthritis, right knee: Secondary | ICD-10-CM | POA: Diagnosis not present

## 2023-11-26 ENCOUNTER — Ambulatory Visit

## 2023-11-27 ENCOUNTER — Ambulatory Visit (INDEPENDENT_AMBULATORY_CARE_PROVIDER_SITE_OTHER): Admitting: Pharmacist

## 2023-11-27 DIAGNOSIS — Z794 Long term (current) use of insulin: Secondary | ICD-10-CM | POA: Diagnosis not present

## 2023-11-27 DIAGNOSIS — Z7984 Long term (current) use of oral hypoglycemic drugs: Secondary | ICD-10-CM | POA: Diagnosis not present

## 2023-11-27 DIAGNOSIS — E119 Type 2 diabetes mellitus without complications: Secondary | ICD-10-CM | POA: Diagnosis not present

## 2023-11-27 NOTE — Progress Notes (Signed)
 11/27/2023 Name: Joe [name] MRN: [id number] DOB: 05/21/1941  Chief Complaint  Patient presents with   Diabetes    Joe [name] is a 83 y.o. year old male who was referred for medication management by their primary care provider, [name], Rock HERO, FNP. They presented for a face to face visit today.   They were referred to the pharmacist by their PCP for assistance in managing diabetes    Subjective:  Care Team: Primary Care Provider: Severa Rock HERO, FNP  Medication Access/Adherence  Current Pharmacy:  [facility] [place], [place] - [address] [place] [address] Phone: [phone] Fax: [phone]  [facility] [address], [place] - [address] [place] [address] Phone: [phone] Fax: [phone]   Patient reports affordability concerns with their medications: No  Patient reports access/transportation concerns to their pharmacy: No  Patient reports adherence concerns with their medications:  No     Diabetes:  Current medications: Tresiba , novolog , jardiance , januvia , metformin  Medications tried in the past: fiasp  Not interested in GLP1 due to: patient does not want to lose anymore weight  Using FLS2--attempted to switch to FSL3 plus but app phone issues  Date of Download: 11/27/23     Patient reports ONE hypoglycemic s/sx including dizziness, shakiness, sweating. Patient denies hyperglycemic symptoms including polyuria, polydipsia, polyphagia, nocturia, neuropathy, blurred vision.  Current meal patterns:  3 meals a day Discussed meal planning options and Plate method for healthy eating Avoid sugary drinks and desserts Incorporate balanced protein, non starchy veggies, 1 serving of carbohydrate with each meal Increase water intake Increase physical activity as able Does enjoy snacks/small sweets at times  Current physical activity: encouraged as able  Current medication access  support: medicare/n/a   Objective:  Lab Results  Component Value Date   HGBA1C 8.1 (H) 11/13/2023    Lab Results  Component Value Date   CREATININE 1.40 (H) 01/14/2023   BUN 25 01/14/2023   NA 136 01/14/2023   K 5.0 01/14/2023   CL 101 01/14/2023   CO2 22 01/14/2023    Lab Results  Component Value Date   CHOL 129 01/14/2023   HDL 51 01/14/2023   LDLCALC 59 01/14/2023   TRIG 104 01/14/2023   CHOLHDL 2.5 01/14/2023    Medications Reviewed Today     Reviewed by Billee Mliss BIRCH, [facility] (Pharmacist) on 12/16/23 at 1338  Med List Status: <None>   Medication Order Taking? Sig Documenting Provider Last Dose Status Informant  Accu-Chek Softclix Lancets lancets [id number]  CHECK BLOOD SUGAR 4 TIMES A DAY OR AS DIRECTED [provider]  Active   aspirin  81 MG tablet [id number]  Take 81 mg by mouth daily. [provider]  Active Spouse/Significant Other  betamethasone  0.5% cream-vitamin a&d ointment 1:1 mixture [id number]  Apply topically 2 (two) times daily as needed. Joe Rock HERO, FNP  Active   Blood Glucose Monitoring Suppl w/Device KIT [id number]  Test BS BID and PRN dx E11.9. Give test strips and lancets to match machine given Joe Nancyann ORN, MD  Active   cholecalciferol (VITAMIN D ) 1000 UNITS tablet [id number]  Take 2,000 Units by mouth daily. [provider]  Active Spouse/Significant Other  Cinnamon 500 MG TABS [id number]  Take 1,000 mg by mouth in the morning and at bedtime. [provider]  Active Spouse/Significant Other  Continuous Glucose Sensor (FREESTYLE LIBRE 3 PLUS SENSOR) MISC [id number]  Change sensor  every 15 days. Joe Rock HERO, FNP  Active   diclofenac  Sodium (VOLTAREN ) 1 % GEL [id number]  Apply 2 g topically in the morning, at noon, and at bedtime. Joe Rock HERO, FNP  Active   doxylamine, Sleep, (UNISOM) 25 MG tablet [id number]  Take 25 mg by mouth at bedtime as needed for sleep.  [provider]  Active Spouse/Significant Other   esomeprazole  (NEXIUM ) 40 MG capsule [id number]  1 capsule daily [name], Rock HERO, FNP  Active   glucose blood (ACCU-CHEK GUIDE) test strip [id number]  Check blood sugar 4 times daily Dx E 11.40 [name], Joe M, FNP  Active   insulin  aspart (NOVOLOG  FLEXPEN) 100 UNIT/ML FlexPen [id number]  Inject 10-15 units in the morning before breakfast and before dinner as directed Joe Rock HERO, FNP  Active   insulin  degludec (TRESIBA  FLEXTOUCH) 100 UNIT/ML FlexTouch Pen [id number]  Inject 22 Units into the skin at bedtime. Joe Rock HERO, FNP  Active   Insulin  Pen Needle (PEN NEEDLES) 32G X 5 MM MISC [id number]  1 Device by Does not apply route daily. Joe Rock HERO, FNP  Active   JANUVIA  100 MG tablet [id number]  Take 1 tablet by mouth once daily Joe Rock HERO, FNP  Active   JARDIANCE  25 MG TABS tablet [id number]  TAKE 1 TABLET BY MOUTH IN THE MORNING Joe Rock HERO, FNP  Active   MELATONIN PO [id number]  Take by mouth. [provider]  Active   metFORMIN  (GLUCOPHAGE -XR) 500 MG 24 hr tablet [id number]  TAKE TWO TABLETS TWICE DAILY WITH MEAL(S)  Patient taking differently: 500 mg in the morning and at bedtime.   Joe Rock HERO, FNP  Active   Multiple Vitamin (MULTI VITAMIN) TABS [id number]  1 tablet Orally Once a day for 30 day(s) [provider]  Active   olmesartan  (BENICAR ) 20 MG tablet [id number]  Take 1 tablet (20 mg total) by mouth daily. Joe Rock HERO, FNP  Active   Probiotic Product (PROBIOTIC PO) [id number]  Take 1 tablet by mouth daily. [provider]  Active Spouse/Significant Other  simvastatin  (ZOCOR ) 40 MG tablet [id number]  TAKE ONE TABLET DAILY EVERY EVENING [name], Rock HERO, FNP  Active   triamcinolone  cream (KENALOG ) 0.1 % [id number]  Apply 1 Application topically 2 (two) times daily. Joe Rock HERO, FNP  Active   vitamin B-12 (CYANOCOBALAMIN ) 100 MCG tablet [id number]  Take 100 mcg by mouth daily. [provider]  Active               Assessment/Plan:    Diabetes: Diabetes longstanding currently above goal based on A1c (8.3%-->8.1%). Patient is able to verbalize appropriate hypoglycemia management plan. Medication adherence appears appropriate. Post prandial excursion seen on CGM data. No hypoglycemic events noted.  -Ensure CGM sensor place in the subcutaneous tissue (move to back on arm in fatty/subcutaneous--past sensors where near muscle/front of arm) -Continue basal insulin  Tresiba  20-22 units nightly -Adjusted dose of rapid insulin  Novolog  to 10-15 units PRIOR to breakfast and 10-15 units PRIOR to lunch given breakfast is his largest meal of the day. START Novolog  5-10 units at dinner.  Patient has not been giving meal time/rapid insulin  prior to meals.  This will help aid in preventing post prandial spikes. -high protein snack at bedtime to avoid hypoglycemia -Continued SGLT2-I Jardiance  25 mg daily. Counseled on sick day rules. - Continue METFORMIN  -Continued Januvia  100 mg daily. Respectfully declines GLP1 therapy. Watch renal function with GFR 50 -discussed potential referral to endocrinology for  insulin  pump or device to aid in steady blood sugar levels -Patient educated on purpose, proper use, and potential adverse effects of insulin .  -Extensively discussed pathophysiology of diabetes, recommended lifestyle interventions, dietary effects on blood sugar control.  -Counseled on s/sx of and management of hypoglycemia.  -Next A1c anticipated 02/10/24     Follow Up Plan: 2 months w/ pharmD  Mliss Tarry Griffin, PharmD, BCACP, CPP Clinical Pharmacist, [facility]

## 2023-12-06 ENCOUNTER — Other Ambulatory Visit: Payer: Self-pay | Admitting: Family Medicine

## 2023-12-06 DIAGNOSIS — Z794 Long term (current) use of insulin: Secondary | ICD-10-CM

## 2023-12-09 DIAGNOSIS — L84 Corns and callosities: Secondary | ICD-10-CM | POA: Diagnosis not present

## 2023-12-09 DIAGNOSIS — E1142 Type 2 diabetes mellitus with diabetic polyneuropathy: Secondary | ICD-10-CM | POA: Diagnosis not present

## 2023-12-09 DIAGNOSIS — M79674 Pain in right toe(s): Secondary | ICD-10-CM | POA: Diagnosis not present

## 2023-12-09 DIAGNOSIS — M79675 Pain in left toe(s): Secondary | ICD-10-CM | POA: Diagnosis not present

## 2023-12-09 DIAGNOSIS — B351 Tinea unguium: Secondary | ICD-10-CM | POA: Diagnosis not present

## 2023-12-12 ENCOUNTER — Encounter: Payer: Self-pay | Admitting: Family Medicine

## 2023-12-12 ENCOUNTER — Ambulatory Visit: Admitting: Family Medicine

## 2023-12-12 VITALS — BP 109/54 | HR 59 | Temp 97.3°F | Ht 69.0 in | Wt 173.6 lb

## 2023-12-12 DIAGNOSIS — D485 Neoplasm of uncertain behavior of skin: Secondary | ICD-10-CM | POA: Diagnosis not present

## 2023-12-12 DIAGNOSIS — G8929 Other chronic pain: Secondary | ICD-10-CM | POA: Diagnosis not present

## 2023-12-12 DIAGNOSIS — L989 Disorder of the skin and subcutaneous tissue, unspecified: Secondary | ICD-10-CM

## 2023-12-12 DIAGNOSIS — M545 Low back pain, unspecified: Secondary | ICD-10-CM | POA: Diagnosis not present

## 2023-12-12 DIAGNOSIS — M25561 Pain in right knee: Secondary | ICD-10-CM

## 2023-12-12 MED ORDER — DICLOFENAC SODIUM 1 % EX GEL
2.0000 g | Freq: Three times a day (TID) | CUTANEOUS | 0 refills | Status: AC
Start: 2023-12-12 — End: ?

## 2023-12-12 MED ORDER — METHYLPREDNISOLONE ACETATE 40 MG/ML IJ SUSP
40.0000 mg | Freq: Once | INTRAMUSCULAR | Status: AC
  Administered 2023-12-12: 40 mg via INTRA_ARTICULAR

## 2023-12-12 NOTE — Progress Notes (Signed)
 Subjective:  [name], male    DOB: [birth year], 83 y.o.   MRN: [id number]  Patient Care Team: Severa Rock HERO, FNP as PCP - General (Family Medicine) Watt Rush, MD (Urology) Avram Lupita BRAVO, MD (Gastroenterology) Lavona Agent, MD as Consulting Physician (Cardiology) Billee Mliss BIRCH, [facility] (Pharmacist) Ladora Ross Lacy Phebe, MD as Referring Physician (Optometry) Billee Mliss BIRCH, [facility] as Pharmacist (Family Medicine)   Chief Complaint:  Leg Swelling (Right ankle and leg swelling on and off for about 1 month. Elevation helps )   HPI: Joe [name] is a 83 y.o. male presenting on 12/12/2023 for Leg Swelling (Right ankle and leg swelling on and off for about 1 month. Elevation helps )   The patient, with diabetes and a history of varicose veins, presents with right ankle swelling and knee pain.  He has been experiencing swelling in his right ankle, which improves with elevation. He notes asymmetry in his legs, with the right ankle appearing slightly bowed out. He suspects that the use of a knee brace may contribute to the ankle swelling. Additionally, he uses nanosocks, which cause slight swelling above the sock line.  He continues to experience knee pain despite using a knee brace. The pain persists, affecting his daily activities.  He has a history of a pinched nerve in his back, diagnosed many years ago. The pain has worsened with age, affecting his ability to perform daily activities. He finds relief with heat application and is cautious with anti-inflammatory medications due to diabetes.  He has several skin concerns, including a lesion on his right shin and multiple patches on his legs. The lesion on his leg has been present for two months and appeared suddenly. He also has a history of varicose veins and venous stasis dermatitis.          Relevant past medical, surgical, family, and social history reviewed and updated as indicated.  Allergies and medications  reviewed and updated. Data reviewed: Chart in Epic.   Past Medical History:  Diagnosis Date   Adenomatous colon polyp    Arthritis    Cataract    Chest pain at rest 05/26/2016   Diabetes mellitus without complication (HCC)    Dyslipidemia    ESOPHAGITIS, REFLUX 07/09/2004   Qualifier: Diagnosis of  By: Orlando CMA LEODIS), Stephanie     Hiatal hernia    HOH (hard of hearing)    has bilateral Hearing aids   Hypertension    Irritable bowel syndrome    Kidney stone 1970   Pneumonia    Tick bites    took 5 rounds of Doxycycline  this summer    Past Surgical History:  Procedure Laterality Date   COLONOSCOPY     ESOPHAGEAL DILATION  1996   ESOPHAGEAL DILATION  2003   FLEXIBLE SIGMOIDOSCOPY     HEMICOLECTOMY  08/2005   Right   KNEE SURGERY Right    TONSILLECTOMY      Social History   Socioeconomic History   Marital status: Married    Spouse name: Joe [name]   Number of children: 1   Years of education: 16   Highest education level: Bachelor's degree (e.g., BA, AB, BS)  Occupational History   Occupation: Retired Runner, broadcasting/film/video  Tobacco Use   Smoking status: Former    Current packs/day: 0.00    Average packs/day: 2.0 packs/day for 30.2 years (60.4 ttl pk-yrs)    Types: Cigarettes    Start date: 06/03/1954  Quit date: 08/24/1984    Years since quitting: 39.3   Smokeless tobacco: Never   Tobacco comments:    Has not used to tobacco products for the past 20 years  Vaping Use   Vaping status: Never Used  Substance and Sexual Activity   Alcohol use: Yes    Alcohol/week: 7.0 standard drinks of alcohol    Types: 7 Standard drinks or equivalent per week    Comment: [place] at night when he sits on the deck   Drug use: No   Sexual activity: Yes    Partners: Female  Other Topics Concern   Not on file  Social History Narrative   Lives in [place] with wife, both retired teachers   1 son   3 caffeinated beverages daily   1 alcoholic beverage daily   Former smoker no current  tobacco no drug use   Social Drivers of Corporate investment banker Strain: Low Risk  (04/04/2021)   Overall Financial Resource Strain (CARDIA)    Difficulty of Paying Living Expenses: Not hard at all  Food Insecurity: Low Risk  (06/30/2023)   Received from [facility]   Hunger Vital Sign    Within the past 12 months, you worried that your food would run out before you got money to buy more: Never true    Within the past 12 months, the food you bought just didn't last and you didn't have money to get more. : Never true  Transportation Needs: No Transportation Needs (06/30/2023)   Received from [facility]    In the past 12 months, has lack of reliable transportation kept you from medical appointments, meetings, work or from getting things needed for daily living? : No  Physical Activity: Insufficiently Active (04/04/2021)   Exercise Vital Sign    Days of Exercise per Week: 3 days    Minutes of Exercise per Session: 20 min  Stress: No Stress Concern Present (04/04/2021)   [employer] of Occupational Health - Occupational Stress Questionnaire    Feeling of Stress : Not at all  Social Connections: Moderately Isolated (04/04/2021)   Social Connection and Isolation Panel    Frequency of Communication with Friends and Family: More than three times a week    Frequency of Social Gatherings with Friends and Family: Once a week    Attends Religious Services: Never    Database administrator or Organizations: No    Attends Banker Meetings: Never    Marital Status: Married  Catering manager Violence: Not At Risk (04/04/2021)   Humiliation, Afraid, Rape, and Kick questionnaire    Fear of Current or Ex-Partner: No    Emotionally Abused: No    Physically Abused: No    Sexually Abused: No    Outpatient Encounter Medications as of 12/12/2023  Medication Sig   Accu-Chek Softclix Lancets lancets CHECK BLOOD SUGAR 4 TIMES A DAY OR AS DIRECTED   aspirin  81 MG  tablet Take 81 mg by mouth daily.   betamethasone  0.5% cream-vitamin a&d ointment 1:1 mixture Apply topically 2 (two) times daily as needed.   Blood Glucose Monitoring Suppl w/Device KIT Test BS BID and PRN dx E11.9. Give test strips and lancets to match machine given   cholecalciferol (VITAMIN D ) 1000 UNITS tablet Take 2,000 Units by mouth daily.   Cinnamon 500 MG TABS Take 1,000 mg by mouth in the morning and at bedtime.   Continuous Glucose Sensor (FREESTYLE LIBRE 3 PLUS SENSOR) MISC  Change sensor every 15 days.   diclofenac  Sodium (VOLTAREN ) 1 % GEL Apply 2 g topically in the morning, at noon, and at bedtime.   doxylamine, Sleep, (UNISOM) 25 MG tablet Take 25 mg by mouth at bedtime as needed for sleep.    esomeprazole  (NEXIUM ) 40 MG capsule 1 capsule daily   glucose blood (ACCU-CHEK GUIDE) test strip Check blood sugar 4 times daily Dx E 11.40   insulin  aspart (NOVOLOG  FLEXPEN) 100 UNIT/ML FlexPen Inject 10-15 units in the morning before breakfast and before dinner as directed   insulin  degludec (TRESIBA  FLEXTOUCH) 100 UNIT/ML FlexTouch Pen Inject 22 Units into the skin at bedtime.   Insulin  Pen Needle (PEN NEEDLES) 32G X 5 MM MISC 1 Device by Does not apply route daily.   JANUVIA  100 MG tablet Take 1 tablet by mouth once daily   JARDIANCE  25 MG TABS tablet TAKE 1 TABLET BY MOUTH IN THE MORNING   MELATONIN PO Take by mouth.   metFORMIN  (GLUCOPHAGE -XR) 500 MG 24 hr tablet TAKE TWO TABLETS TWICE DAILY WITH MEAL(S) (Patient taking differently: 500 mg in the morning and at bedtime.)   Multiple Vitamin (MULTI VITAMIN) TABS 1 tablet Orally Once a day for 30 day(s)   olmesartan  (BENICAR ) 20 MG tablet Take 1 tablet (20 mg total) by mouth daily.   Probiotic Product (PROBIOTIC PO) Take 1 tablet by mouth daily.   simvastatin  (ZOCOR ) 40 MG tablet TAKE ONE TABLET DAILY EVERY EVENING   triamcinolone  cream (KENALOG ) 0.1 % Apply 1 Application topically 2 (two) times daily.   vitamin B-12 (CYANOCOBALAMIN )  100 MCG tablet Take 100 mcg by mouth daily.   [EXPIRED] methylPREDNISolone  acetate (DEPO-MEDROL ) injection 40 mg    No facility-administered encounter medications on file as of 12/12/2023.    Allergies  Allergen Reactions   Ace Inhibitors Cough   Trazodone Other (See Comments)   Trazamine [Trazodone & Diet Manage Prod] Other (See Comments)    nightmares    Pertinent ROS per HPI, otherwise unremarkable      Objective:  BP (!) 109/54   Pulse (!) 59   Temp (!) 97.3 F (36.3 C)   Ht 5' 9 (1.753 m)   Wt 173 lb 9.6 oz (78.7 kg)   SpO2 99%   BMI 25.64 kg/m    Wt Readings from Last 3 Encounters:  12/12/23 173 lb 9.6 oz (78.7 kg)  11/13/23 174 lb 6.4 oz (79.1 kg)  09/10/23 176 lb 9.6 oz (80.1 kg)    Physical Exam Vitals and nursing note reviewed.  Constitutional:      General: He is not in acute distress.    Appearance: Normal appearance. He is not ill-appearing, toxic-appearing or diaphoretic.  HENT:     Head: Normocephalic and atraumatic.     Ears:     Comments: Bilateral hearing aids    Nose: Nose normal.     Mouth/Throat:     Mouth: Mucous membranes are moist.  Eyes:     Conjunctiva/sclera: Conjunctivae normal.     Pupils: Pupils are equal, round, and reactive to light.  Cardiovascular:     Rate and Rhythm: Normal rate and regular rhythm.     Pulses: Normal pulses.     Heart sounds: Normal heart sounds.  Pulmonary:     Effort: Pulmonary effort is normal.     Breath sounds: Normal breath sounds.  Musculoskeletal:     Right upper leg: Normal.     Right knee: Swelling present. No deformity, effusion, erythema, ecchymosis,  lacerations, bony tenderness or crepitus. Decreased range of motion. Tenderness present. Normal alignment, normal meniscus and normal patellar mobility.     Right lower leg: Normal. No edema.     Left lower leg: No edema.  Skin:    General: Skin is warm and dry.     Capillary Refill: Capillary refill takes less than 2 seconds.     Findings:  Lesion present.      Neurological:     General: No focal deficit present.     Mental Status: He is alert and oriented to person, place, and time.  Psychiatric:        Mood and Affect: Mood normal.        Behavior: Behavior normal.        Thought Content: Thought content normal.        Judgment: Judgment normal.    SKIN: Venous stasis dermatitis on legs, varicose veins, and a suspicious lesion on the right shin, possible squamous cell carcinoma.      Skin excision  Date/Time: 12/12/2023 12:43 PM  Performed by: Severa Rock HERO, FNP Authorized by: Severa Rock HERO, FNP   Number of Lesions: 1 Lesion 1:    Body area: lower extremity   Lower extremity location: R lower leg   Initial size (mm): 4   Malignancy: malignancy unknown     Destruction method: cryotherapy     Destruction method comment: three freeze thaw cycles Joint Injection/Arthrocentesis  Date/Time: 12/12/2023 12:44 PM  Performed by: Severa Rock HERO, FNP Authorized by: Severa Rock HERO, FNP  Indications: joint swelling and pain  Body area: knee Joint: right knee Local anesthesia used: yes  Anesthesia: Local anesthesia used: yes Local Anesthetic: co-phenylcaine spray  Sedation: Patient sedated: no  Preparation: Patient was prepped and draped in the usual sterile fashion. Needle size: 22 G Ultrasound guidance: no Approach: anterior Aspirate amount: 0 mL Methylprednisolone  amount: 60 mg Lidocaine 2% amount: 3.5 mL Patient tolerance: patient tolerated the procedure well with no immediate complications      Results for orders placed or performed in visit on 11/13/23  Bayer DCA Hb A1c Waived   Collection Time: 11/13/23 11:56 AM  Result Value Ref Range   HB A1C (BAYER DCA - WAIVED) 8.1 (H) 4.8 - 5.6 %       Pertinent labs & imaging results that were available during my care of the patient were reviewed by me and considered in my medical decision making.  Assessment & Plan:  Jamonta was seen today for leg  swelling.  Diagnoses and all orders for this visit:  Chronic pain of right knee -     diclofenac  Sodium (VOLTAREN ) 1 % GEL; Apply 2 g topically in the morning, at noon, and at bedtime. -     methylPREDNISolone  acetate (DEPO-MEDROL ) injection 40 mg -     Joint Injection/Arthrocentesis  Chronic bilateral low back pain without sciatica -     diclofenac  Sodium (VOLTAREN ) 1 % GEL; Apply 2 g topically in the morning, at noon, and at bedtime.  Skin lesion -     Ambulatory referral to Dermatology -     Skin excision        Osteoarthritis of the knee Chronic knee pain managed with a knee brace. Topical Voltaren  gel discussed as an alternative to oral anti-inflammatories due to diabetes. Numbing spray used to minimize injection discomfort. - Administer knee injection for pain relief - Prescribe topical Voltaren  gel for knee pain management - Provide knee brace for  osteoarthritis  Right ankle swelling Intermittent swelling of the right ankle, improving with elevation. Possibly associated with knee brace use causing localized compression and subsequent swelling above the brace. Compression socks extending above the knee may help reduce swelling. - Recommend compression socks that extend above the knee to reduce swelling  Pinched nerve in the lower back Chronic lower back pain attributed to a pinched nerve, worsening with age. Heat therapy and stretching recommended to relieve nerve compression. Caution advised with back braces due to potential respiratory complications such as pneumonia if not fitted properly. Topical Voltaren  gel is safe for use in diabetics and can be applied to the back. - Recommend heat therapy and stretching exercises for lower back pain - Prescribe topical Voltaren  gel for back pain management  Venous stasis dermatitis Chronic venous stasis dermatitis associated with varicose veins, leading to swelling and skin changes. Requires regular moisturizing to improve skin  integrity. Moisturizers such as CeraVe or Cetaphil are recommended. - Recommend regular application of moisturizers such as CeraVe or Cetaphil  Skin Lesion  Suspicious skin lesion on the right shin, likely squamous cell carcinoma. Requires dermatological evaluation for potential Mohs procedure. Sun exposure identified as a risk factor. Squamous cell carcinoma is not melanoma and is typically treatable without significant risk of spreading. - Freeze the lesion as initial treatment - Refer to dermatology for further evaluation and management          Continue all other maintenance medications.  Follow up plan: Return if symptoms worsen or fail to improve.   Continue healthy lifestyle choices, including diet (rich in fruits, vegetables, and lean proteins, and low in salt and simple carbohydrates) and exercise (at least 30 minutes of moderate physical activity daily).  Educational handout given for joint injection, cryotherapy aftercare   The above assessment and management plan was discussed with the patient. The patient verbalized understanding of and has agreed to the management plan. Patient is aware to call the clinic if they develop any new symptoms or if symptoms persist or worsen. Patient is aware when to return to the clinic for a follow-up visit. Patient educated on when it is appropriate to go to the emergency department.   Rosaline Bruns, FNP-C [facility] [phone]

## 2024-01-01 ENCOUNTER — Other Ambulatory Visit: Payer: Self-pay | Admitting: Family Medicine

## 2024-01-01 DIAGNOSIS — L2084 Intrinsic (allergic) eczema: Secondary | ICD-10-CM

## 2024-01-12 DIAGNOSIS — X32XXXA Exposure to sunlight, initial encounter: Secondary | ICD-10-CM | POA: Diagnosis not present

## 2024-01-12 DIAGNOSIS — L308 Other specified dermatitis: Secondary | ICD-10-CM | POA: Diagnosis not present

## 2024-01-12 DIAGNOSIS — I872 Venous insufficiency (chronic) (peripheral): Secondary | ICD-10-CM | POA: Diagnosis not present

## 2024-01-12 DIAGNOSIS — L570 Actinic keratosis: Secondary | ICD-10-CM | POA: Diagnosis not present

## 2024-01-16 ENCOUNTER — Ambulatory Visit: Admitting: Family Medicine

## 2024-01-16 ENCOUNTER — Encounter: Payer: Self-pay | Admitting: Family Medicine

## 2024-01-16 VITALS — BP 136/65 | HR 66 | Temp 97.6°F | Ht 69.0 in | Wt 171.0 lb

## 2024-01-16 DIAGNOSIS — E1140 Type 2 diabetes mellitus with diabetic neuropathy, unspecified: Secondary | ICD-10-CM | POA: Diagnosis not present

## 2024-01-16 DIAGNOSIS — Z794 Long term (current) use of insulin: Secondary | ICD-10-CM

## 2024-01-16 DIAGNOSIS — R234 Changes in skin texture: Secondary | ICD-10-CM | POA: Diagnosis not present

## 2024-01-16 MED ORDER — AMMONIUM LACTATE 12 % EX CREA: 1.0000 | TOPICAL_CREAM | CUTANEOUS | 0 refills | Status: AC | PRN

## 2024-01-16 MED ORDER — NOVOLOG FLEXPEN 100 UNIT/ML ~~LOC~~ SOPN: PEN_INJECTOR | SUBCUTANEOUS | 11 refills | Status: DC

## 2024-01-16 MED ORDER — TRESIBA FLEXTOUCH 100 UNIT/ML ~~LOC~~ SOPN: 22.0000 [IU] | PEN_INJECTOR | Freq: Every day | SUBCUTANEOUS | 0 refills | Status: DC

## 2024-01-16 NOTE — Progress Notes (Signed)
 Subjective:  [name], male    DOB: 1941-01-21, 83 y.o.   MRN: [id number]  Patient Care Team: Severa Rock HERO, FNP as PCP - General (Family Medicine) Watt Rush, MD (Urology) Avram Lupita BRAVO, MD (Gastroenterology) Lavona Agent, MD as Consulting Physician (Cardiology) Billee Mliss BIRCH, [facility] (Pharmacist) Ladora Ross Lacy Phebe, MD as Referring Physician (Optometry) Billee Mliss BIRCH, [facility] as Pharmacist (Family Medicine)   Chief Complaint:  Foot Pain (Bilateral foot pain due to cracking skin on the bottom of feet. )   HPI: Joe [name] is a 83 y.o. male presenting on 01/16/2024 for Foot Pain (Bilateral foot pain due to cracking skin on the bottom of feet. )  Joe [name] is an 83 year old male who presents with dry, cracked skin on his feet.  He has dry, cracked skin on his feet, characterized by calluses and fissures, particularly on the heel and toe. The condition is sore and affects his ability to walk. He notes that the skin is painful and dry, but not infected.  He has been using clobetasol, a topical steroid, which he started yesterday to help heal the affected areas. Previously, he managed itching with Sarna lotion. He has not been using a previously recommended foot treatment consistently.        Relevant past medical, surgical, family, and social history reviewed and updated as indicated.  Allergies and medications reviewed and updated. Data reviewed: Chart in Epic.   Past Medical History:  Diagnosis Date   Adenomatous colon polyp    Arthritis    Cataract    Chest pain at rest 05/26/2016   Diabetes mellitus without complication (HCC)    Dyslipidemia    ESOPHAGITIS, REFLUX 07/09/2004   Qualifier: Diagnosis of  By: Orlando CMA LEODIS), Stephanie     Hiatal hernia    HOH (hard of hearing)    has bilateral Hearing aids   Hypertension    Irritable bowel syndrome    Kidney stone 1970   Pneumonia    Tick bites    took 5 rounds of Doxycycline  this summer     Past Surgical History:  Procedure Laterality Date   COLONOSCOPY     ESOPHAGEAL DILATION  1996   ESOPHAGEAL DILATION  2003   FLEXIBLE SIGMOIDOSCOPY     HEMICOLECTOMY  08/2005   Right   KNEE SURGERY Right    TONSILLECTOMY      Social History   Socioeconomic History   Marital status: Married    Spouse name: Shona   Number of children: 1   Years of education: 16   Highest education level: Bachelor's degree (e.g., BA, AB, BS)  Occupational History   Occupation: Retired Runner, broadcasting/film/video  Tobacco Use   Smoking status: Former    Current packs/day: 0.00    Average packs/day: 2.0 packs/day for 30.2 years (60.4 ttl pk-yrs)    Types: Cigarettes    Start date: 06/03/1954    Quit date: 08/24/1984    Years since quitting: 39.4   Smokeless tobacco: Never   Tobacco comments:    Has not used to tobacco products for the past 20 years  Vaping Use   Vaping status: Never Used  Substance and Sexual Activity   Alcohol use: Yes    Alcohol/week: 7.0 standard drinks of alcohol    Types: 7 Standard drinks or equivalent per week    Comment: Bourbon at night when he sits on the deck   Drug use: No  Sexual activity: Yes    Partners: Female  Other Topics Concern   Not on file  Social History Narrative   Lives in [place] with wife, both retired teachers   1 son   3 caffeinated beverages daily   1 alcoholic beverage daily   Former smoker no current tobacco no drug use   Social Drivers of Corporate investment banker Strain: Low Risk  (04/04/2021)   Overall Financial Resource Strain (CARDIA)    Difficulty of Paying Living Expenses: Not hard at all  Food Insecurity: Low Risk  (06/30/2023)   Received from [facility]   Hunger Vital Sign    Within the past 12 months, you worried that your food would run out before you got money to buy more: Never true    Within the past 12 months, the food you bought just didn't last and you didn't have money to get more. : Never true  Transportation Needs: No  Transportation Needs (06/30/2023)   Received from [facility]    In the past 12 months, has lack of reliable transportation kept you from medical appointments, meetings, work or from getting things needed for daily living? : No  Physical Activity: Insufficiently Active (04/04/2021)   Exercise Vital Sign    Days of Exercise per Week: 3 days    Minutes of Exercise per Session: 20 min  Stress: No Stress Concern Present (04/04/2021)   [employer] of Occupational Health - Occupational Stress Questionnaire    Feeling of Stress : Not at all  Social Connections: Moderately Isolated (04/04/2021)   Social Connection and Isolation Panel    Frequency of Communication with Friends and Family: More than three times a week    Frequency of Social Gatherings with Friends and Family: Once a week    Attends Religious Services: Never    Database administrator or Organizations: No    Attends Banker Meetings: Never    Marital Status: Married  Catering manager Violence: Not At Risk (04/04/2021)   Humiliation, Afraid, Rape, and Kick questionnaire    Fear of Current or Ex-Partner: No    Emotionally Abused: No    Physically Abused: No    Sexually Abused: No    Outpatient Encounter Medications as of 01/16/2024  Medication Sig   Accu-Chek Softclix Lancets lancets CHECK BLOOD SUGAR 4 TIMES A DAY OR AS DIRECTED   ammonium lactate (AMLACTIN) 12 % cream Apply 1 Application topically as needed for dry skin.   aspirin 81 MG tablet Take 81 mg by mouth daily.   betamethasone 0.5% cream-vitamin a&d ointment 1:1 mixture Apply topically 2 (two) times daily as needed.   Blood Glucose Monitoring Suppl w/Device KIT Test BS BID and PRN dx E11.9. Give test strips and lancets to match machine given   cholecalciferol (VITAMIN D) 1000 UNITS tablet Take 2,000 Units by mouth daily.   Cinnamon 500 MG TABS Take 1,000 mg by mouth in the morning and at bedtime.   Continuous Glucose Sensor (FREESTYLE  LIBRE 3 PLUS SENSOR) MISC Change sensor every 15 days.   diclofenac Sodium (VOLTAREN) 1 % GEL Apply 2 g topically in the morning, at noon, and at bedtime.   doxylamine, Sleep, (UNISOM) 25 MG tablet Take 25 mg by mouth at bedtime as needed for sleep.    esomeprazole (NEXIUM) 40 MG capsule 1 capsule daily   glucose blood (ACCU-CHEK GUIDE) test strip Check blood sugar 4 times daily Dx E 11.40   Insulin  Pen Needle (PEN NEEDLES) 32G X 5 MM MISC 1 Device by Does not apply route daily.   JANUVIA 100 MG tablet Take 1 tablet by mouth once daily   JARDIANCE 25 MG TABS tablet TAKE 1 TABLET BY MOUTH IN THE MORNING   MELATONIN PO Take by mouth.   metFORMIN (GLUCOPHAGE-XR) 500 MG 24 hr tablet TAKE TWO TABLETS TWICE DAILY WITH MEAL(S) (Patient taking differently: 500 mg in the morning and at bedtime.)   Multiple Vitamin (MULTI VITAMIN) TABS 1 tablet Orally Once a day for 30 day(s)   olmesartan (BENICAR) 20 MG tablet Take 1 tablet (20 mg total) by mouth daily.   Probiotic Product (PROBIOTIC PO) Take 1 tablet by mouth daily.   simvastatin (ZOCOR) 40 MG tablet TAKE ONE TABLET DAILY EVERY EVENING   triamcinolone cream (KENALOG) 0.1 % APPLY TO THE AFFECTED AREA(S) TWICE DAILY   vitamin B-12 (CYANOCOBALAMIN) 100 MCG tablet Take 100 mcg by mouth daily.   [DISCONTINUED] insulin aspart (NOVOLOG FLEXPEN) 100 UNIT/ML FlexPen Inject 10-15 units in the morning before breakfast and before dinner as directed   [DISCONTINUED] insulin degludec (TRESIBA FLEXTOUCH) 100 UNIT/ML FlexTouch Pen Inject 22 Units into the skin at bedtime.   insulin aspart (NOVOLOG FLEXPEN) 100 UNIT/ML FlexPen Inject 10-15 units in the morning before breakfast and before dinner as directed   insulin degludec (TRESIBA FLEXTOUCH) 100 UNIT/ML FlexTouch Pen Inject 22 Units into the skin at bedtime.   No facility-administered encounter medications on file as of 01/16/2024.    Allergies  Allergen Reactions   Ace Inhibitors Cough   Trazodone Other (See  Comments)   Trazamine [Trazodone & Diet Manage Prod] Other (See Comments)    nightmares    Pertinent ROS per HPI, otherwise unremarkable      Objective:  BP 136/65   Pulse 66   Temp 97.6 F (36.4 C)   Ht 5' 9 (1.753 m)   Wt 171 lb (77.6 kg)   SpO2 98%   BMI 25.25 kg/m    Wt Readings from Last 3 Encounters:  01/16/24 171 lb (77.6 kg)  12/12/23 173 lb 9.6 oz (78.7 kg)  11/13/23 174 lb 6.4 oz (79.1 kg)    Physical Exam Vitals and nursing note reviewed.  Constitutional:      General: He is not in acute distress.    Appearance: Normal appearance. He is normal weight. He is not ill-appearing, toxic-appearing or diaphoretic.  HENT:     Head: Normocephalic and atraumatic.     Mouth/Throat:     Mouth: Mucous membranes are moist.  Eyes:     Pupils: Pupils are equal, round, and reactive to light.  Cardiovascular:     Rate and Rhythm: Normal rate and regular rhythm.     Heart sounds: Normal heart sounds.  Pulmonary:     Effort: Pulmonary effort is normal.     Breath sounds: Normal breath sounds.  Feet:     Right foot:     Skin integrity: Callus, dry skin and fissure present.     Left foot:     Skin integrity: Callus, dry skin and fissure present.  Skin:    General: Skin is warm and dry.     Capillary Refill: Capillary refill takes less than 2 seconds.  Neurological:     General: No focal deficit present.     Mental Status: He is alert and oriented to person, place, and time.  Psychiatric:        Mood and Affect: Mood normal.  Behavior: Behavior normal.        Thought Content: Thought content normal.        Judgment: Judgment normal.      Results for orders placed or performed in visit on 11/13/23  Bayer DCA Hb A1c Waived   Collection Time: 11/13/23 11:56 AM  Result Value Ref Range   HB A1C (BAYER DCA - WAIVED) 8.1 (H) 4.8 - 5.6 %       Pertinent labs & imaging results that were available during my care of the patient were reviewed by me and considered  in my medical decision making.  Assessment & Plan:  Jaeger was seen today for foot pain.  Diagnoses and all orders for this visit:  Fissure in skin of both feet -     ammonium lactate (AMLACTIN) 12 % cream; Apply 1 Application topically as needed for dry skin.  Type 2 diabetes mellitus with diabetic neuropathy, with long-term current use of insulin (HCC) -     insulin aspart (NOVOLOG FLEXPEN) 100 UNIT/ML FlexPen; Inject 10-15 units in the morning before breakfast and before dinner as directed -     insulin degludec (TRESIBA FLEXTOUCH) 100 UNIT/ML FlexTouch Pen; Inject 22 Units into the skin at bedtime.     Fissures, calluses, and xerosis of both feet with pruritus Chronic dry skin with fissures and calluses on both feet, causing discomfort and difficulty walking. No signs of infection. Recent use of clobetasol for pruritus, which may contribute to skin irritation. - Prescribe Amlactin to be applied twice daily to affected areas to soften and heal dry skin. - Advise purchase of CeraVe Healing Ointment over-the-counter and apply at least twice daily, up to three times if needed, as a protective barrier. - Instruct to wear socks and shoes consistently to protect feet. - Recommend warm Epsom salt soaks a couple of times a day to soften skin. - Continue clobetasol once or twice daily if pruritus persists, otherwise use CeraVe Healing Ointment.       Continue all other maintenance medications.  Follow up plan: Return if symptoms worsen or fail to improve.   Continue healthy lifestyle choices, including diet (rich in fruits, vegetables, and lean proteins, and low in salt and simple carbohydrates) and exercise (at least 30 minutes of moderate physical activity daily).   The above assessment and management plan was discussed with the patient. The patient verbalized understanding of and has agreed to the management plan. Patient is aware to call the clinic if they develop any new symptoms or  if symptoms persist or worsen. Patient is aware when to return to the clinic for a follow-up visit. Patient educated on when it is appropriate to go to the emergency department.   Rosaline Bruns, FNP-C [facility] [phone]

## 2024-02-07 ENCOUNTER — Other Ambulatory Visit: Payer: Self-pay | Admitting: Family Medicine

## 2024-02-07 DIAGNOSIS — E1169 Type 2 diabetes mellitus with other specified complication: Secondary | ICD-10-CM

## 2024-02-07 DIAGNOSIS — K219 Gastro-esophageal reflux disease without esophagitis: Secondary | ICD-10-CM

## 2024-02-10 ENCOUNTER — Ambulatory Visit: Admitting: Family Medicine

## 2024-02-10 ENCOUNTER — Ambulatory Visit: Payer: Self-pay | Admitting: Family Medicine

## 2024-02-10 ENCOUNTER — Encounter: Payer: Self-pay | Admitting: Family Medicine

## 2024-02-10 VITALS — BP 137/64 | HR 64 | Temp 97.2°F | Ht 69.0 in | Wt 170.6 lb

## 2024-02-10 DIAGNOSIS — R234 Changes in skin texture: Secondary | ICD-10-CM | POA: Diagnosis not present

## 2024-02-10 DIAGNOSIS — L84 Corns and callosities: Secondary | ICD-10-CM

## 2024-02-10 DIAGNOSIS — E1159 Type 2 diabetes mellitus with other circulatory complications: Secondary | ICD-10-CM | POA: Diagnosis not present

## 2024-02-10 DIAGNOSIS — E1122 Type 2 diabetes mellitus with diabetic chronic kidney disease: Secondary | ICD-10-CM | POA: Diagnosis not present

## 2024-02-10 DIAGNOSIS — R413 Other amnesia: Secondary | ICD-10-CM | POA: Diagnosis not present

## 2024-02-10 DIAGNOSIS — E785 Hyperlipidemia, unspecified: Secondary | ICD-10-CM

## 2024-02-10 DIAGNOSIS — E1140 Type 2 diabetes mellitus with diabetic neuropathy, unspecified: Secondary | ICD-10-CM | POA: Diagnosis not present

## 2024-02-10 DIAGNOSIS — E1169 Type 2 diabetes mellitus with other specified complication: Secondary | ICD-10-CM | POA: Diagnosis not present

## 2024-02-10 DIAGNOSIS — I872 Venous insufficiency (chronic) (peripheral): Secondary | ICD-10-CM | POA: Insufficient documentation

## 2024-02-10 DIAGNOSIS — Z794 Long term (current) use of insulin: Secondary | ICD-10-CM

## 2024-02-10 DIAGNOSIS — N183 Chronic kidney disease, stage 3 unspecified: Secondary | ICD-10-CM

## 2024-02-10 DIAGNOSIS — L299 Pruritus, unspecified: Secondary | ICD-10-CM

## 2024-02-10 DIAGNOSIS — I152 Hypertension secondary to endocrine disorders: Secondary | ICD-10-CM

## 2024-02-10 LAB — BAYER DCA HB A1C WAIVED: HB A1C (BAYER DCA - WAIVED): 8.2 % — ABNORMAL HIGH (ref 4.8–5.6)

## 2024-02-10 MED ORDER — OLMESARTAN MEDOXOMIL 20 MG PO TABS: 20.0000 mg | ORAL_TABLET | Freq: Every day | ORAL | 1 refills | Status: AC

## 2024-02-10 MED ORDER — HYDROXYZINE HCL 10 MG PO TABS: 10.0000 mg | ORAL_TABLET | Freq: Three times a day (TID) | ORAL | 0 refills | Status: AC | PRN

## 2024-02-10 MED ORDER — SITAGLIPTIN PHOSPHATE 100 MG PO TABS: 100.0000 mg | ORAL_TABLET | Freq: Every day | ORAL | 1 refills | Status: DC

## 2024-02-10 MED ORDER — METFORMIN HCL ER 500 MG PO TB24: ORAL_TABLET | ORAL | 1 refills | Status: AC

## 2024-02-10 NOTE — Patient Instructions (Signed)

## 2024-02-10 NOTE — Progress Notes (Signed)
 Subjective:  [name], male    DOB: Sep 27, 1940, 83 y.o.   MRN: [id number]  Patient Care Team: Severa Rock HERO, FNP as PCP - General (Family Medicine) Watt Rush, MD (Urology) Avram Lupita BRAVO, MD (Gastroenterology) Lavona Agent, MD as Consulting Physician (Cardiology) Billee Mliss BIRCH, [facility] (Pharmacist) Ladora Ross Lacy Phebe, MD as Referring Physician (Optometry) Billee Mliss BIRCH, [facility] as Pharmacist (Family Medicine)   Chief Complaint:  Diabetes (3 month follow up )   HPI: Joe [name] is a 83 y.o. male presenting on 02/10/2024 for Diabetes (3 month follow up )   Discussed the use of AI scribe software for clinical note transcription with the patient, who gave verbal consent to proceed.  History of Present Illness   Joe [name] is an 83 year old male who presents with dry, itchy skin and foot calluses.  He has been experiencing dry, itchy skin and foot calluses. He uses Eucerin and Sarna lotions for dryness and itching, and has tried India lotion, which he finds effective for softening his skin. He had a lesion removed by dermatology on August 11. He does not regularly elevate his legs despite venous stasis changes.  He has a history of varicose veins since college. He uses Amlactin on the bottom of his feet to help with calluses but not on the top. Three weeks ago, he had two water blisters on his foot that cracked open, causing significant pain and difficulty walking.  He experiences generalized itching, which disrupts his sleep. He uses Benadryl at night and Claritin in the morning for allergy relief. Eucerin provides relief for the itching, which can occur at any time of the day.  He mentions fluctuating blood sugars but denies increased hunger, thirst, or urination. No abnormal bleeding or bruising, and his blood pressure has been stable. He denies any changes in vision.  He reports memory issues, particularly with short-term memory, and difficulty hearing. He  uses hearing aids, which amplify sounds significantly. He has not seen an ear doctor recently.          Relevant past medical, surgical, family, and social history reviewed and updated as indicated.  Allergies and medications reviewed and updated. Data reviewed: Chart in Epic.   Past Medical History:  Diagnosis Date   Adenomatous colon polyp    Arthritis    Cataract    Chest pain at rest 05/26/2016   Diabetes mellitus without complication (HCC)    Dyslipidemia    ESOPHAGITIS, REFLUX 07/09/2004   Qualifier: Diagnosis of  By: Orlando CMA LEODIS), Stephanie     Hiatal hernia    HOH (hard of hearing)    has bilateral Hearing aids   Hypertension    Irritable bowel syndrome    Kidney stone 1970   Pneumonia    Tick bites    took 5 rounds of Doxycycline  this summer    Past Surgical History:  Procedure Laterality Date   COLONOSCOPY     ESOPHAGEAL DILATION  1996   ESOPHAGEAL DILATION  2003   FLEXIBLE SIGMOIDOSCOPY     HEMICOLECTOMY  08/2005   Right   KNEE SURGERY Right    TONSILLECTOMY      Social History   Socioeconomic History   Marital status: Married    Spouse name: Shona   Number of children: 1   Years of education: 16   Highest education level: Bachelor's degree (e.g., BA, AB, BS)  Occupational History   Occupation: Retired Runner, broadcasting/film/video  Tobacco Use   Smoking status: Former    Current packs/day: 0.00    Average packs/day: 2.0 packs/day for 30.2 years (60.4 ttl pk-yrs)    Types: Cigarettes    Start date: 06/03/1954    Quit date: 08/24/1984    Years since quitting: 39.4   Smokeless tobacco: Never   Tobacco comments:    Has not used to tobacco products for the past 20 years  Vaping Use   Vaping status: Never Used  Substance and Sexual Activity   Alcohol use: Yes    Alcohol/week: 7.0 standard drinks of alcohol    Types: 7 Standard drinks or equivalent per week    Comment: Bourbon at night when he sits on the deck   Drug use: No   Sexual activity: Yes     Partners: Female  Other Topics Concern   Not on file  Social History Narrative   Lives in [place] with wife, both retired teachers   1 son   3 caffeinated beverages daily   1 alcoholic beverage daily   Former smoker no current tobacco no drug use   Social Drivers of Corporate investment banker Strain: Low Risk  (04/04/2021)   Overall Financial Resource Strain (CARDIA)    Difficulty of Paying Living Expenses: Not hard at all  Food Insecurity: Low Risk  (06/30/2023)   Received from [facility]   Hunger Vital Sign    Within the past 12 months, you worried that your food would run out before you got money to buy more: Never true    Within the past 12 months, the food you bought just didn't last and you didn't have money to get more. : Never true  Transportation Needs: No Transportation Needs (06/30/2023)   Received from [facility]    In the past 12 months, has lack of reliable transportation kept you from medical appointments, meetings, work or from getting things needed for daily living? : No  Physical Activity: Insufficiently Active (04/04/2021)   Exercise Vital Sign    Days of Exercise per Week: 3 days    Minutes of Exercise per Session: 20 min  Stress: No Stress Concern Present (04/04/2021)   [employer] of Occupational Health - Occupational Stress Questionnaire    Feeling of Stress : Not at all  Social Connections: Moderately Isolated (04/04/2021)   Social Connection and Isolation Panel    Frequency of Communication with Friends and Family: More than three times a week    Frequency of Social Gatherings with Friends and Family: Once a week    Attends Religious Services: Never    Database administrator or Organizations: No    Attends Banker Meetings: Never    Marital Status: Married  Catering manager Violence: Not At Risk (04/04/2021)   Humiliation, Afraid, Rape, and Kick questionnaire    Fear of Current or Ex-Partner: No    Emotionally  Abused: No    Physically Abused: No    Sexually Abused: No    Outpatient Encounter Medications as of 02/10/2024  Medication Sig   Accu-Chek Softclix Lancets lancets CHECK BLOOD SUGAR 4 TIMES A DAY OR AS DIRECTED   ammonium lactate  (AMLACTIN) 12 % cream Apply 1 Application topically as needed for dry skin.   aspirin  81 MG tablet Take 81 mg by mouth daily.   betamethasone  0.5% cream-vitamin a&d ointment 1:1 mixture Apply topically 2 (two) times daily as needed.   Blood Glucose Monitoring Suppl w/Device KIT Test  BS BID and PRN dx E11.9. Give test strips and lancets to match machine given   cholecalciferol (VITAMIN D ) 1000 UNITS tablet Take 2,000 Units by mouth daily.   Cinnamon 500 MG TABS Take 1,000 mg by mouth in the morning and at bedtime.   Continuous Glucose Sensor (FREESTYLE LIBRE 3 PLUS SENSOR) MISC Change sensor every 15 days.   diclofenac  Sodium (VOLTAREN ) 1 % GEL Apply 2 g topically in the morning, at noon, and at bedtime.   doxylamine, Sleep, (UNISOM) 25 MG tablet Take 25 mg by mouth at bedtime as needed for sleep.    esomeprazole  (NEXIUM ) 40 MG capsule TAKE ONE CAPSULE DAILY   glucose blood (ACCU-CHEK GUIDE) test strip Check blood sugar 4 times daily Dx E 11.40   hydrOXYzine  (ATARAX ) 10 MG tablet Take 1 tablet (10 mg total) by mouth 3 (three) times daily as needed for itching.   insulin  aspart (NOVOLOG  FLEXPEN) 100 UNIT/ML FlexPen Inject 10-15 units in the morning before breakfast and before dinner as directed   insulin  degludec (TRESIBA  FLEXTOUCH) 100 UNIT/ML FlexTouch Pen Inject 22 Units into the skin at bedtime.   Insulin  Pen Needle (PEN NEEDLES) 32G X 5 MM MISC 1 Device by Does not apply route daily.   JARDIANCE  25 MG TABS tablet TAKE 1 TABLET BY MOUTH IN THE MORNING   MELATONIN PO Take by mouth.   Multiple Vitamin (MULTI VITAMIN) TABS 1 tablet Orally Once a day for 30 day(s)   Probiotic Product (PROBIOTIC PO) Take 1 tablet by mouth daily.   simvastatin  (ZOCOR ) 40 MG tablet TAKE  ONE TABLET DAILY EVERY EVENING   triamcinolone  cream (KENALOG ) 0.1 % APPLY TO THE AFFECTED AREA(S) TWICE DAILY   vitamin B-12 (CYANOCOBALAMIN ) 100 MCG tablet Take 100 mcg by mouth daily.   metFORMIN  (GLUCOPHAGE -XR) 500 MG 24 hr tablet TAKE TWO TABLETS TWICE DAILY WITH MEAL(S)   olmesartan  (BENICAR ) 20 MG tablet Take 1 tablet (20 mg total) by mouth daily.   sitaGLIPtin  (JANUVIA ) 100 MG tablet Take 1 tablet (100 mg total) by mouth daily.   [DISCONTINUED] JANUVIA  100 MG tablet Take 1 tablet by mouth once daily   [DISCONTINUED] metFORMIN  (GLUCOPHAGE -XR) 500 MG 24 hr tablet TAKE TWO TABLETS TWICE DAILY WITH MEAL(S) (Patient taking differently: 500 mg in the morning and at bedtime.)   [DISCONTINUED] olmesartan  (BENICAR ) 20 MG tablet Take 1 tablet (20 mg total) by mouth daily.   No facility-administered encounter medications on file as of 02/10/2024.    Allergies  Allergen Reactions   Ace Inhibitors Cough   Trazodone Other (See Comments)   Trazamine [Trazodone & Diet Manage Prod] Other (See Comments)    nightmares    Pertinent ROS per HPI, otherwise unremarkable      Objective:  BP 137/64   Pulse 64   Temp (!) 97.2 F (36.2 C)   Ht 5' 9 (1.753 m)   Wt 170 lb 9.6 oz (77.4 kg)   SpO2 97%   BMI 25.19 kg/m    Wt Readings from Last 3 Encounters:  02/10/24 170 lb 9.6 oz (77.4 kg)  01/16/24 171 lb (77.6 kg)  12/12/23 173 lb 9.6 oz (78.7 kg)    Physical Exam Vitals and nursing note reviewed.  Constitutional:      General: He is not in acute distress.    Appearance: Normal appearance. He is not ill-appearing, toxic-appearing or diaphoretic.  HENT:     Head: Normocephalic and atraumatic.     Ears:     Comments: Bilateral  hearing aids    Nose: Nose normal.     Mouth/Throat:     Mouth: Mucous membranes are moist.  Eyes:     Conjunctiva/sclera: Conjunctivae normal.     Pupils: Pupils are equal, round, and reactive to light.  Cardiovascular:     Rate and Rhythm: Normal rate and  regular rhythm.     Heart sounds: Normal heart sounds. No murmur heard.    No friction rub. No gallop.     Comments: VV to BLE with venous stasis dermatitis  Pulmonary:     Effort: Pulmonary effort is normal.     Breath sounds: Normal breath sounds.  Musculoskeletal:     Cervical back: Neck supple.  Feet:     Right foot:     Skin integrity: Callus, dry skin and fissure present.     Left foot:     Skin integrity: Callus, dry skin and fissure present.  Skin:    General: Skin is warm and dry.     Capillary Refill: Capillary refill takes less than 2 seconds.  Neurological:     General: No focal deficit present.     Mental Status: He is alert and oriented to person, place, and time.  Psychiatric:        Mood and Affect: Mood normal.        Behavior: Behavior normal.        Thought Content: Thought content normal.        Judgment: Judgment normal.        Results for orders placed or performed in visit on 11/13/23  Bayer DCA Hb A1c Waived   Collection Time: 11/13/23 11:56 AM  Result Value Ref Range   HB A1C (BAYER DCA - WAIVED) 8.1 (H) 4.8 - 5.6 %       Pertinent labs & imaging results that were available during my care of the patient were reviewed by me and considered in my medical decision making.  Assessment & Plan:  Cadon was seen today for diabetes.  Diagnoses and all orders for this visit:  Type 2 diabetes mellitus with diabetic neuropathy, with long-term current use of insulin  (HCC) -     Bayer DCA Hb A1c Waived -     metFORMIN  (GLUCOPHAGE -XR) 500 MG 24 hr tablet; TAKE TWO TABLETS TWICE DAILY WITH MEAL(S) -     sitaGLIPtin  (JANUVIA ) 100 MG tablet; Take 1 tablet (100 mg total) by mouth daily. -     Vitamin B12 -     Microalbumin / creatinine urine ratio -     BMP8+EGFR  Hypertension associated with diabetes (HCC) -     olmesartan  (BENICAR ) 20 MG tablet; Take 1 tablet (20 mg total) by mouth daily. -     Microalbumin / creatinine urine ratio -      BMP8+EGFR  Hyperlipidemia associated with type 2 diabetes mellitus (HCC) -     BMP8+EGFR  CKD stage 3 due to type 2 diabetes mellitus (HCC) -     BMP8+EGFR  Callus of foot Fissure in skin of both feet Has appointment with podiatry this Thursday  Venous stasis dermatitis of both lower extremities -     BMP8+EGFR  Pruritus -     BMP8+EGFR -     hydrOXYzine  (ATARAX ) 10 MG tablet; Take 1 tablet (10 mg total) by mouth 3 (three) times daily as needed for itching.  Short-term memory loss -     Vitamin B12 -     BMP8+EGFR  Type 2 diabetes mellitus with chronic kidney disease, neuropathy, and circulatory complications Blood sugars are fluctuating with a recent A1c of 8.2. Blood pressure is well-controlled. No increased hunger, thirst, urination, or vision changes reported. - Check urine protein to monitor kidney function - Ensure regular annual eye exams  Venous stasis with varicose veins of lower extremities Chronic venous stasis changes with varicose veins in the lower extremities. Dermatology intervention on August 11 for lesion removal. Risk of open wounds if not managed properly. - Elevate legs twice a day for one hour - Use compression socks that cover the calf - Apply moisturizing lotions such as Eucerin, CeraVe, or Cetaphil - Add triamcinolone  to moisturizers twice a day - Follow up with Doctor Roddie on Thursday for further evaluation and management  Chronic dry skin and pruritus Chronic dry skin with pruritus, exacerbated by dry air and insufficient moisturizing. Itching affects sleep. - Apply Eucerin or similar moisturizer regularly - Use triamcinolone  as needed for itching - Prescribe hydroxyzine  10 mg for nighttime itching, cautioning about potential sedation - Avoid pedicures to prevent complications  Hearing difficulties and memory issues Reports trouble hearing and short-term memory issues.        I spent 40 minutes dedicated to the care of this patient on  the date of this encounter to include pre-visit review of records including diagnostic studies and labs, face-to-face time with the patient discussing above conditions, post visit ordering of testing, clinical documentation in the electronic health record, making appropriate referrals as documented, and communicating necessary information to the patient's healthcare team.    Continue all other maintenance medications.  Follow up plan: Return if symptoms worsen or fail to improve.   Continue healthy lifestyle choices, including diet (rich in fruits, vegetables, and lean proteins, and low in salt and simple carbohydrates) and exercise (at least 30 minutes of moderate physical activity daily).  Educational handout given for DM  The above assessment and management plan was discussed with the patient. The patient verbalized understanding of and has agreed to the management plan. Patient is aware to call the clinic if they develop any new symptoms or if symptoms persist or worsen. Patient is aware when to return to the clinic for a follow-up visit. Patient educated on when it is appropriate to go to the emergency department.   Rosaline Bruns, FNP-C [facility] [phone]

## 2024-02-11 LAB — VITAMIN B12: Vitamin B-12: 742 pg/mL (ref 232–1245)

## 2024-02-12 DIAGNOSIS — M79671 Pain in right foot: Secondary | ICD-10-CM | POA: Diagnosis not present

## 2024-02-12 DIAGNOSIS — M79672 Pain in left foot: Secondary | ICD-10-CM | POA: Diagnosis not present

## 2024-02-12 DIAGNOSIS — L603 Nail dystrophy: Secondary | ICD-10-CM | POA: Diagnosis not present

## 2024-02-12 LAB — BMP8+EGFR
BUN/Creatinine Ratio: 12 (ref 10–24)
BUN: 15 mg/dL (ref 8–27)
CO2: 20 mmol/L (ref 20–29)
Calcium: 8.7 mg/dL (ref 8.6–10.2)
Chloride: 98 mmol/L (ref 96–106)
Creatinine, Ser: 1.25 mg/dL (ref 0.76–1.27)
Glucose: 296 mg/dL — AB (ref 70–99)
Potassium: 5 mmol/L (ref 3.5–5.2)
Sodium: 133 mmol/L — AB (ref 134–144)
eGFR: 57 mL/min/1.73 — AB (ref >59)

## 2024-02-12 LAB — SPECIMEN STATUS REPORT

## 2024-02-24 ENCOUNTER — Other Ambulatory Visit: Payer: Self-pay

## 2024-02-24 NOTE — Progress Notes (Signed)
 Pharmacy Quality Measure Review  This patient is appearing on a report for being at risk of failing the adherence measure for hypertension (ACEi/ARB) medications this calendar year.   Medication: olmesartan  20 mg daily Last fill date: 02/10/2024 for 90 day supply  Spoke with patients wife and DPR, Shona. She is responsible for his medications and refills. She states that the long gap in refills is due to him only taking olmesartan  three times per week due to hypotension. Additionally, she states that he is only taking 1 tablet of metformin  twice daily due to hypoglycemia. Will update the medication list to reflect current use.   Woodie Jock, PharmD PGY1 Pharmacy Resident  02/24/2024

## 2024-03-07 ENCOUNTER — Other Ambulatory Visit: Payer: Self-pay | Admitting: Family Medicine

## 2024-03-07 DIAGNOSIS — E1140 Type 2 diabetes mellitus with diabetic neuropathy, unspecified: Secondary | ICD-10-CM

## 2024-03-11 ENCOUNTER — Ambulatory Visit: Admitting: *Deleted

## 2024-03-11 DIAGNOSIS — M79674 Pain in right toe(s): Secondary | ICD-10-CM | POA: Diagnosis not present

## 2024-03-11 DIAGNOSIS — B351 Tinea unguium: Secondary | ICD-10-CM | POA: Diagnosis not present

## 2024-03-11 DIAGNOSIS — E1142 Type 2 diabetes mellitus with diabetic polyneuropathy: Secondary | ICD-10-CM | POA: Diagnosis not present

## 2024-03-11 DIAGNOSIS — L84 Corns and callosities: Secondary | ICD-10-CM | POA: Diagnosis not present

## 2024-03-11 DIAGNOSIS — M79675 Pain in left toe(s): Secondary | ICD-10-CM | POA: Diagnosis not present

## 2024-03-11 NOTE — Progress Notes (Signed)
 Pt came in today about his elevated BS. He ate a Pecan roll this morning for breakfast and forgot to take his insulin . He did not take his Novolog  until 1230. Check his BS w/ his CGM about 1 pm & it was 471, about 2 pm it had come down to 390, then at 214 at the office his CGM said 348. Pt feels fine and is having no symptoms. PCP gave him 12 u of Novolog  while he was here in the office and they will go eat lunch now. If his BS after 1 1/2 hrs is below 250 he is ok, no more insulin  until his normal bedtime dose of Tresiba , if his BS is still above 250 he is to give another 8 u of Novolog  and still give his normal bedtime dose of Tresiba . His wife and him verbalize understanding.

## 2024-03-18 ENCOUNTER — Ambulatory Visit

## 2024-03-18 ENCOUNTER — Other Ambulatory Visit: Payer: Self-pay | Admitting: Family Medicine

## 2024-03-18 DIAGNOSIS — E1140 Type 2 diabetes mellitus with diabetic neuropathy, unspecified: Secondary | ICD-10-CM

## 2024-03-18 NOTE — Addendum Note (Signed)
 Addended by: Raman Featherston D on: 03/18/2024 04:43 PM   Modules accepted: Orders

## 2024-03-23 ENCOUNTER — Other Ambulatory Visit: Payer: Self-pay | Admitting: Family Medicine

## 2024-03-23 DIAGNOSIS — Z794 Long term (current) use of insulin: Secondary | ICD-10-CM

## 2024-03-26 ENCOUNTER — Other Ambulatory Visit (HOSPITAL_COMMUNITY): Payer: Self-pay

## 2024-03-26 ENCOUNTER — Telehealth: Payer: Self-pay | Admitting: Family Medicine

## 2024-03-26 ENCOUNTER — Telehealth: Payer: Self-pay | Admitting: Pharmacy Technician

## 2024-03-26 NOTE — Telephone Encounter (Signed)
 PA request has been Received. New Encounter has been or will be created for follow up. For additional info see Pharmacy Prior Auth telephone encounter from 03/26/2024.

## 2024-03-26 NOTE — Telephone Encounter (Signed)
 Pharmacy Patient Advocate Encounter   Received notification from Pt Calls Messages that prior authorization for Insulin  Degludec Flextouch 100 unit/ml pen (Tresiba ) is required/requested.   Insurance verification completed.   The patient is insured through [employer].   Per test claim:   LANTUS or LANTUS SOLOSTAR,TOUJEO MAX U-300 SOLOSTAR is preferred by the insurance.  If suggested medication is appropriate, Please send in a new RX and discontinue this one. If not, please advise as to why it's not appropriate so that we may request a Prior Authorization. Please note, some preferred medications may still require a PA.  If the suggested medications have not been trialed and there are no contraindications to their use, the PA will not be submitted, as it will not be approved.

## 2024-03-26 NOTE — Telephone Encounter (Signed)
 Wife came in and said Pharmacy can't refill his tresiba  until he gets pa

## 2024-03-29 ENCOUNTER — Encounter: Payer: Self-pay | Admitting: Pharmacist

## 2024-03-30 ENCOUNTER — Telehealth: Payer: Self-pay | Admitting: Pharmacist

## 2024-03-30 DIAGNOSIS — E119 Type 2 diabetes mellitus without complications: Secondary | ICD-10-CM

## 2024-03-30 DIAGNOSIS — E1140 Type 2 diabetes mellitus with diabetic neuropathy, unspecified: Secondary | ICD-10-CM

## 2024-03-30 MED ORDER — TRESIBA FLEXTOUCH 100 UNIT/ML ~~LOC~~ SOPN: 22.0000 [IU] | PEN_INJECTOR | Freq: Every day | SUBCUTANEOUS | 5 refills | Status: AC

## 2024-03-30 NOTE — Telephone Encounter (Signed)
 Patient's insurance prefers brand name Tresiba  New brand Tresiba  RX called in to [facility] pharmacy with DAW1 Patient stable on Tresiba  22 units at bedtime   Mliss Tarry Griffin, PharmD, BCACP, CPP Clinical Pharmacist, [facility]

## 2024-03-31 ENCOUNTER — Other Ambulatory Visit (HOSPITAL_COMMUNITY): Payer: Self-pay

## 2024-03-31 NOTE — Telephone Encounter (Signed)
 Brand name Tresiba  covered. New prescription sent to patient's pharmacy for Brand name.

## 2024-04-05 DIAGNOSIS — L858 Other specified epidermal thickening: Secondary | ICD-10-CM | POA: Diagnosis not present

## 2024-04-05 DIAGNOSIS — L400 Psoriasis vulgaris: Secondary | ICD-10-CM | POA: Diagnosis not present

## 2024-04-05 DIAGNOSIS — X32XXXD Exposure to sunlight, subsequent encounter: Secondary | ICD-10-CM | POA: Diagnosis not present

## 2024-04-05 DIAGNOSIS — L570 Actinic keratosis: Secondary | ICD-10-CM | POA: Diagnosis not present

## 2024-04-07 ENCOUNTER — Ambulatory Visit (INDEPENDENT_AMBULATORY_CARE_PROVIDER_SITE_OTHER): Payer: Self-pay

## 2024-04-07 DIAGNOSIS — Z23 Encounter for immunization: Secondary | ICD-10-CM | POA: Diagnosis not present

## 2024-05-04 ENCOUNTER — Other Ambulatory Visit: Payer: Self-pay | Admitting: Family Medicine

## 2024-05-04 DIAGNOSIS — E1140 Type 2 diabetes mellitus with diabetic neuropathy, unspecified: Secondary | ICD-10-CM

## 2024-05-11 ENCOUNTER — Other Ambulatory Visit: Payer: Self-pay | Admitting: Family Medicine

## 2024-05-11 DIAGNOSIS — E1169 Type 2 diabetes mellitus with other specified complication: Secondary | ICD-10-CM

## 2024-05-11 DIAGNOSIS — K219 Gastro-esophageal reflux disease without esophagitis: Secondary | ICD-10-CM

## 2024-05-13 ENCOUNTER — Ambulatory Visit: Payer: Self-pay | Admitting: Family Medicine

## 2024-05-13 ENCOUNTER — Encounter: Payer: Self-pay | Admitting: Family Medicine

## 2024-05-13 VITALS — BP 117/62 | HR 63 | Temp 96.9°F | Ht 69.0 in | Wt 169.8 lb

## 2024-05-13 DIAGNOSIS — G8929 Other chronic pain: Secondary | ICD-10-CM

## 2024-05-13 DIAGNOSIS — I152 Hypertension secondary to endocrine disorders: Secondary | ICD-10-CM

## 2024-05-13 DIAGNOSIS — E1169 Type 2 diabetes mellitus with other specified complication: Secondary | ICD-10-CM | POA: Diagnosis not present

## 2024-05-13 DIAGNOSIS — M25511 Pain in right shoulder: Secondary | ICD-10-CM | POA: Diagnosis not present

## 2024-05-13 DIAGNOSIS — Z794 Long term (current) use of insulin: Secondary | ICD-10-CM | POA: Diagnosis not present

## 2024-05-13 DIAGNOSIS — E1140 Type 2 diabetes mellitus with diabetic neuropathy, unspecified: Secondary | ICD-10-CM

## 2024-05-13 DIAGNOSIS — N183 Chronic kidney disease, stage 3 unspecified: Secondary | ICD-10-CM | POA: Diagnosis not present

## 2024-05-13 DIAGNOSIS — E1122 Type 2 diabetes mellitus with diabetic chronic kidney disease: Secondary | ICD-10-CM

## 2024-05-13 DIAGNOSIS — E785 Hyperlipidemia, unspecified: Secondary | ICD-10-CM | POA: Diagnosis not present

## 2024-05-13 DIAGNOSIS — E1159 Type 2 diabetes mellitus with other circulatory complications: Secondary | ICD-10-CM | POA: Diagnosis not present

## 2024-05-13 LAB — BASIC METABOLIC PANEL WITH GFR
BUN/Creatinine Ratio: 10 (ref 10–24)
BUN: 14 mg/dL (ref 8–27)
CO2: 21 mmol/L (ref 20–29)
Calcium: 8.8 mg/dL (ref 8.6–10.2)
Chloride: 100 mmol/L (ref 96–106)
Creatinine, Ser: 1.41 mg/dL — ABNORMAL HIGH (ref 0.76–1.27)
Glucose: 296 mg/dL — ABNORMAL HIGH (ref 70–99)
Potassium: 4.8 mmol/L (ref 3.5–5.2)
Sodium: 136 mmol/L (ref 134–144)
eGFR: 50 mL/min/1.73 — ABNORMAL LOW (ref >59)

## 2024-05-13 LAB — BAYER DCA HB A1C WAIVED: HB A1C (BAYER DCA - WAIVED): 7.7 % — ABNORMAL HIGH (ref 4.8–5.6)

## 2024-05-13 MED ADMIN — Methylprednisolone Acetate Inj Susp 80 MG/ML: 40 mg | INTRAMUSCULAR | NDC 70121157401

## 2024-05-13 NOTE — Patient Instructions (Signed)

## 2024-05-13 NOTE — Progress Notes (Signed)
 Subjective:  [name], male    DOB: Jun 03, 1941, 83 y.o.   MRN: [id number]  Patient Care Team: Severa Rock HERO, FNP as PCP - General (Family Medicine) Watt Rush, MD (Urology) Avram Lupita BRAVO, MD (Gastroenterology) Lynwood Schilling, MD as Consulting Physician (Cardiology) Billee Mliss BIRCH, RPH-CPP (Pharmacist) Ladora Ross Lacy Phebe, MD as Referring Physician (Optometry) Billee Mliss BIRCH, RPH-CPP as Pharmacist (Family Medicine)   Chief Complaint:  Medical Management of Chronic Issues and Shoulder Pain (Right shoulder pain that has been ongoing)   HPI: Joe [name] is a 83 y.o. male presenting on 05/13/2024 for Medical Management of Chronic Issues and Shoulder Pain (Right shoulder pain that has been ongoing)   Discussed the use of AI scribe software for clinical note transcription with the patient, who gave verbal consent to proceed.  History of Present Illness   Joe [name] is an 83 year old male with diabetes who presents for management of skin issues and diabetes control. He is accompanied by a caregiver.  He is experiencing skin issues, particularly on his feet, and has been told he has carotiderma. He has been using clobetasol once daily, although it was prescribed for twice daily use. He has also been using a barrier repair cream, specifically Equate eczema cream, to help with skin moisture and protection.  He has diabetes with recent blood sugar levels fluctuating significantly. His A1c is currently 7.7, an improvement from previous levels. He experiences periods of stable blood sugar followed by unexplained spikes to 350-400 mg/dL. He uses a [employer] for monitoring but has noted discrepancies between the sensor readings and finger stick tests, leading to a replacement of the sensor. His current diabetes medications include metformin , Januvia , Jardiance , and insulin .  He reports no issues with his current medications, including Benicar  for blood pressure,  Nexium  for heartburn, and Zocor  for cholesterol. He occasionally experiences confusion but denies significant memory issues. No side effects such as cough, chest pain, or leg swelling from his medications.  He has undergone cataract surgery and reports improved vision, now able to watch television without glasses. He experiences shoulder pain, for which he has received injections in the past, with the last one being in July.  He uses support stockings for leg swelling but finds them difficult to put on. He finds it challenging to incorporate elevating his feet above heart level twice daily for an hour into his routine. No itching or unusual urine odor, and no significant vision changes, muscle aches, or abnormal bleeding.          Relevant past medical, surgical, family, and social history reviewed and updated as indicated.  Allergies and medications reviewed and updated. Data reviewed: Chart in Epic.   Past Medical History:  Diagnosis Date   Adenomatous colon polyp    Arthritis    Cataract    Chest pain at rest 05/26/2016   Diabetes mellitus without complication (HCC)    Dyslipidemia    ESOPHAGITIS, REFLUX 07/09/2004   Qualifier: Diagnosis of  By: Orlando CMA LEODIS), Stephanie     Hiatal hernia    HOH (hard of hearing)    has bilateral Hearing aids   Hypertension    Irritable bowel syndrome    Kidney stone 1970   Pneumonia    Tick bites    took 5 rounds of Doxycycline  this summer    Past Surgical History:  Procedure Laterality Date   COLONOSCOPY     ESOPHAGEAL DILATION  1996   ESOPHAGEAL DILATION  2003   FLEXIBLE SIGMOIDOSCOPY     HEMICOLECTOMY  08/2005   Right   KNEE SURGERY Right    TONSILLECTOMY      Social History   Socioeconomic History   Marital status: Married    Spouse name: Shona   Number of children: 1   Years of education: 16   Highest education level: Bachelor's degree (e.g., BA, AB, BS)  Occupational History   Occupation: Retired Runner, Broadcasting/film/video  Tobacco  Use   Smoking status: Former    Current packs/day: 0.00    Average packs/day: 2.0 packs/day for 30.2 years (60.4 ttl pk-yrs)    Types: Cigarettes    Start date: 06/03/1954    Quit date: 08/24/1984    Years since quitting: 39.7   Smokeless tobacco: Never   Tobacco comments:    Has not used to tobacco products for the past 20 years  Vaping Use   Vaping status: Never Used  Substance and Sexual Activity   Alcohol use: Yes    Alcohol/week: 7.0 standard drinks of alcohol    Types: 7 Standard drinks or equivalent per week    Comment: Bourbon at night when he sits on the deck   Drug use: No   Sexual activity: Yes    Partners: Female  Other Topics Concern   Not on file  Social History Narrative   Lives in [place] with wife, both retired teachers   1 son   3 caffeinated beverages daily   1 alcoholic beverage daily   Former smoker no current tobacco no drug use   Social Drivers of Health   Tobacco Use: Medium Risk (05/13/2024)   Patient History    Smoking Tobacco Use: Former    Smokeless Tobacco Use: Never    Passive Exposure: Not on Actuary Strain: Not on file  Food Insecurity: Low Risk (06/30/2023)   Received from [facility]   Epic    Within the past 12 months, you worried that your food would run out before you got money to buy more: Never true    Within the past 12 months, the food you bought just didn't last and you didn't have money to get more. : Never true  Transportation Needs: No Transportation Needs (06/30/2023)   Received from [facility]    In the past 12 months, has lack of reliable transportation kept you from medical appointments, meetings, work or from getting things needed for daily living? : No  Physical Activity: Not on file  Stress: Not on file  Social Connections: Not on file  Intimate Partner Violence: Not on file  Depression (PHQ2-9): Low Risk (05/13/2024)   Depression (PHQ2-9)    PHQ-2 Score: 0  Alcohol Screen:  Not on file  Housing: Low Risk (06/30/2023)   Received from [facility]   Epic    What is your living situation today?: I have a steady place to live    Think about the place you live. Do you have problems with any of the following? Choose all that apply:: None/None on this list  Utilities: Low Risk (06/30/2023)   Received from [facility]   Utilities    In the past 12 months has the electric, gas, oil, or water company threatened to shut off services in your home? : No  Health Literacy: Not on file    Outpatient Encounter Medications as of 05/13/2024  Medication Sig   Accu-Chek Softclix Lancets lancets CHECK  BLOOD SUGAR 4 TIMES A DAY OR AS DIRECTED   ammonium lactate  (AMLACTIN) 12 % cream Apply 1 Application topically as needed for dry skin.   aspirin  81 MG tablet Take 81 mg by mouth daily.   betamethasone  0.5% cream-vitamin a&d ointment 1:1 mixture Apply topically 2 (two) times daily as needed.   Blood Glucose Monitoring Suppl w/Device KIT Test BS BID and PRN dx E11.9. Give test strips and lancets to match machine given   cholecalciferol (VITAMIN D ) 1000 UNITS tablet Take 2,000 Units by mouth daily.   Cinnamon 500 MG TABS Take 1,000 mg by mouth in the morning and at bedtime.   Continuous Glucose Sensor (FREESTYLE LIBRE 3 PLUS SENSOR) MISC Change sensor every 15 days.   diclofenac  Sodium (VOLTAREN ) 1 % GEL Apply 2 g topically in the morning, at noon, and at bedtime.   doxylamine, Sleep, (UNISOM) 25 MG tablet Take 25 mg by mouth at bedtime as needed for sleep.    esomeprazole  (NEXIUM ) 40 MG capsule TAKE ONE CAPSULE DAILY   glucose blood (ACCU-CHEK GUIDE) test strip Check blood sugar 4 times daily Dx E 11.40   hydrOXYzine  (ATARAX ) 10 MG tablet Take 1 tablet (10 mg total) by mouth 3 (three) times daily as needed for itching.   insulin  aspart (NOVOLOG  FLEXPEN) 100 UNIT/ML FlexPen Inject 10-15 units in the morning before breakfast and before dinner as directed   Insulin  Pen Needle (PEN  NEEDLES) 32G X 5 MM MISC 1 Device by Does not apply route daily.   JANUVIA  100 MG tablet Take 1 tablet by mouth once daily   JARDIANCE  25 MG TABS tablet TAKE 1 TABLET BY MOUTH IN THE MORNING   MELATONIN PO Take by mouth.   metFORMIN  (GLUCOPHAGE -XR) 500 MG 24 hr tablet TAKE TWO TABLETS TWICE DAILY WITH MEAL(S) (Patient taking differently: Take 1 tablet by mouth 2 (two) times daily. TAKE TWO TABLETS TWICE DAILY WITH MEAL(S))   Multiple Vitamin (MULTI VITAMIN) TABS 1 tablet Orally Once a day for 30 day(s)   olmesartan  (BENICAR ) 20 MG tablet Take 1 tablet (20 mg total) by mouth daily. (Patient taking differently: Take 20 mg by mouth every Monday, Wednesday, and Friday at 6 PM.)   Probiotic Product (PROBIOTIC PO) Take 1 tablet by mouth daily.   simvastatin  (ZOCOR ) 40 MG tablet TAKE ONE TABLET DAILY EVERY EVENING   TRESIBA  FLEXTOUCH 100 UNIT/ML FlexTouch Pen Inject 22 Units into the skin at bedtime. Fill brand name only per insurance   vitamin B-12 (CYANOCOBALAMIN ) 100 MCG tablet Take 100 mcg by mouth daily.   [DISCONTINUED] triamcinolone  cream (KENALOG ) 0.1 % APPLY TO THE AFFECTED AREA(S) TWICE DAILY   [EXPIRED] methylPREDNISolone  acetate (DEPO-MEDROL ) injection 80 mg    No facility-administered encounter medications on file as of 05/13/2024.    Allergies[1]  Pertinent ROS per HPI, otherwise unremarkable      Objective:  BP 117/62   Pulse 63   Temp (!) 96.9 F (36.1 C)   Ht 5' 9 (1.753 m)   Wt 169 lb 12.8 oz (77 kg)   SpO2 100%   BMI 25.08 kg/m    Wt Readings from Last 3 Encounters:  05/13/24 169 lb 12.8 oz (77 kg)  02/10/24 170 lb 9.6 oz (77.4 kg)  01/16/24 171 lb (77.6 kg)    Physical Exam Vitals and nursing note reviewed.  Constitutional:      General: He is not in acute distress.    Appearance: Normal appearance. He is normal weight. He is not  ill-appearing, toxic-appearing or diaphoretic.  HENT:     Head: Normocephalic and atraumatic.     Ears:     Comments: Bilateral  hearing aids    Nose: Nose normal.     Mouth/Throat:     Mouth: Mucous membranes are moist.  Eyes:     Conjunctiva/sclera: Conjunctivae normal.     Pupils: Pupils are equal, round, and reactive to light.  Cardiovascular:     Rate and Rhythm: Normal rate and regular rhythm.     Heart sounds: Normal heart sounds.     Comments: VV to bilateral lower extremities  Pulmonary:     Effort: Pulmonary effort is normal.     Breath sounds: Normal breath sounds.  Abdominal:     General: Bowel sounds are normal.     Palpations: Abdomen is soft.  Musculoskeletal:     Right shoulder: Tenderness present. Decreased range of motion.     Right upper arm: Normal.     Cervical back: Normal and neck supple.     Right lower leg: No edema.     Left lower leg: No edema.  Skin:    General: Skin is warm and dry.     Capillary Refill: Capillary refill takes less than 2 seconds.     Findings: Rash (bilateral lower legs) present.  Neurological:     General: No focal deficit present.     Mental Status: He is alert and oriented to person, place, and time.  Psychiatric:        Mood and Affect: Mood normal.        Behavior: Behavior normal.        Thought Content: Thought content normal.        Judgment: Judgment normal.    Joint Injection/Arthrocentesis  Date/Time: 05/13/2024 1:32 PM  Performed by: Severa Rock HERO, FNP Authorized by: Severa Rock HERO, FNP  Indications: joint swelling and pain  Body area: shoulder Joint: right shoulder Local anesthesia used: yes  Anesthesia: Local anesthesia used: yes Local Anesthetic: co-phenylcaine spray  Sedation: Patient sedated: no  Preparation: Patient was prepped and draped in the usual sterile fashion. Needle size: 22 G Ultrasound guidance: no Approach: posterior Aspirate amount: 0 mL Methylprednisolone  amount: 40 mg Lidocaine 2% amount: 3.5 mL Patient tolerance: patient tolerated the procedure well with no immediate complications       Results  for orders placed or performed in visit on 02/10/24  Bayer DCA Hb A1c Waived   Collection Time: 02/10/24 10:07 AM  Result Value Ref Range   HB A1C (BAYER DCA - WAIVED) 8.2 (H) 4.8 - 5.6 %  Vitamin B12   Collection Time: 02/10/24 10:09 AM  Result Value Ref Range   Vitamin B-12 742 232 - 1,245 pg/mL  BMP8+EGFR   Collection Time: 02/10/24 10:09 AM  Result Value Ref Range   Glucose 296 (H) 70 - 99 mg/dL   BUN 15 8 - 27 mg/dL   Creatinine, Ser 8.74 0.76 - 1.27 mg/dL   eGFR 57 (L) >40 fO/fpw/8.26   BUN/Creatinine Ratio 12 10 - 24   Sodium 133 (L) 134 - 144 mmol/L   Potassium 5.0 3.5 - 5.2 mmol/L   Chloride 98 96 - 106 mmol/L   CO2 20 20 - 29 mmol/L   Calcium 8.7 8.6 - 10.2 mg/dL  Specimen status report   Collection Time: 02/10/24 10:09 AM  Result Value Ref Range   specimen status report Comment        Pertinent labs &  imaging results that were available during my care of the patient were reviewed by me and considered in my medical decision making.  Assessment & Plan:  Joe [name] was seen today for medical management of chronic issues and shoulder pain.  Diagnoses and all orders for this visit:  Type 2 diabetes mellitus with diabetic neuropathy, with long-term current use of insulin  (HCC) -     Bayer DCA Hb A1c Waived -     Basic metabolic panel with GFR -     Microalbumin / creatinine urine ratio  Hyperlipidemia associated with type 2 diabetes mellitus (HCC) -     Bayer DCA Hb A1c Waived -     Basic metabolic panel with GFR  Hypertension associated with diabetes (HCC) -     Bayer DCA Hb A1c Waived -     Basic metabolic panel with GFR -     Microalbumin / creatinine urine ratio  CKD stage 3 due to type 2 diabetes mellitus (HCC) -     Bayer DCA Hb A1c Waived -     Basic metabolic panel with GFR -     Microalbumin / creatinine urine ratio  Chronic right shoulder pain -     methylPREDNISolone  acetate (DEPO-MEDROL ) injection 80 mg -     Joint Injection/Arthrocentesis      Assessment and Plan    Chronic right shoulder pain Managed with periodic corticosteroid injections. Last injection was in July, and it is now over three months since the last injection. He prefers injections over surgery due to the invasiveness and recovery challenges associated with shoulder surgery. - Administered corticosteroid injection to the right shoulder  Type 2 diabetes mellitus complicated by diabetic neuropathy, chronic kidney disease, hypertension, and hyperlipidemia Type 2 diabetes mellitus with recent A1c of 7.7, improved from previous levels above 8. Blood sugars fluctuate significantly, with episodes of hyperglycemia reaching 350-400 mg/dL. Freestyle Libre sensor issues with inaccurate readings, leading to incorrect interventions. Current medications include metformin , Januvia , Jardiance , and insulin . No issues reported with current medications. Blood pressure is well-controlled with Benicar . No significant side effects from Zocor . Vision changes noted post-cataract surgery, with improved vision without glasses.  - Continue metformin , Januvia , Jardiance , and insulin  - Verify blood sugar readings with finger stick if symptomatic or if sensor readings are significantly off - Continue Benicar  for blood pressure management - Continue Zocor  for hyperlipidemia - Monitor for signs of genital infections due to Jardiance  - Ordered urine test to check for proteinuria - Ensure regular diabetic eye exams  Dermatitis of the feet Dermatitis of the feet, previously treated with clobetasol. Current regimen includes clobetasol once daily, but recommended to increase to twice daily. Barrier repair cream (Equate eczema) recommended for use twice daily, especially during winter months. Feet elevated above heart twice daily for an hour to reduce swelling. Sports compression socks recommended for support. - Increase clobetasol application to twice daily - Use Equate eczema cream twice daily for  barrier repair - Elevate feet above heart twice daily for an hour - Use sports compression socks for support          Continue all other maintenance medications.  Follow up plan: Return in about 3 months (around 08/11/2024), or if symptoms worsen or fail to improve, for DM, HTN.   Continue healthy lifestyle choices, including diet (rich in fruits, vegetables, and lean proteins, and low in salt and simple carbohydrates) and exercise (at least 30 minutes of moderate physical activity daily).  Educational handout given for DM  The above assessment and management plan was discussed with the patient. The patient verbalized understanding of and has agreed to the management plan. Patient is aware to call the clinic if they develop any new symptoms or if symptoms persist or worsen. Patient is aware when to return to the clinic for a follow-up visit. Patient educated on when it is appropriate to go to the emergency department.   Rosaline Bruns, FNP-C [facility] [phone]     [1]  Allergies Allergen Reactions   Ace Inhibitors Cough   Trazodone Other (See Comments)   Trazamine [Trazodone & Diet Manage Prod] Other (See Comments)    nightmares

## 2024-05-19 NOTE — Progress Notes (Signed)
 Pharmacy Quality Measure Review  This patient is appearing on a report for being at risk of failing the adherence measure for hypertension (ACEi/ARB) medications this calendar year.   Medication: olmesartan  20 mg Last fill date: 02/10/24 for 90 day supply  Contacted pharmacy to facilitate refills.  Pharmacy was able to refill on 05/19/24 for 90 day supply.  Jenkins Graces, PharmD PGY1 Pharmacy Resident

## 2024-06-01 ENCOUNTER — Telehealth: Payer: Self-pay | Admitting: Pharmacist

## 2024-06-01 NOTE — Telephone Encounter (Signed)
" ° °  This patient is appearing on a report for being at risk of failing the adherence measure for hypertension (ACEi/ARB) medications this calendar year.   Medication: olmesartan  MWF Alternative dosing schedule Last fill date: 02/10/24 for 90 day supply  Will fail measure. Will discuss with PCP to alter RX 10mg  daily    Mliss Tarry Griffin, PharmD, BCACP, CPP Clinical Pharmacist, [facility]   "

## 2024-06-29 ENCOUNTER — Other Ambulatory Visit: Payer: Self-pay | Admitting: Family Medicine

## 2024-06-29 DIAGNOSIS — E1140 Type 2 diabetes mellitus with diabetic neuropathy, unspecified: Secondary | ICD-10-CM

## 2024-07-06 ENCOUNTER — Telehealth: Payer: Self-pay | Admitting: Pharmacy Technician

## 2024-07-06 ENCOUNTER — Other Ambulatory Visit (HOSPITAL_COMMUNITY): Payer: Self-pay

## 2024-07-06 NOTE — Telephone Encounter (Signed)
 Pharmacy Patient Advocate Encounter   Received notification from [facility] KEY that prior authorization for FreeStyle Libre 3 Plus Sensor is required/requested.   Insurance verification completed.   The patient is insured through [employer].   Per test claim: PA required; PA submitted to above mentioned insurance via Latent Key/confirmation #/EOC [id number] Status is pending

## 2024-07-07 ENCOUNTER — Other Ambulatory Visit (HOSPITAL_COMMUNITY): Payer: Self-pay

## 2024-07-07 NOTE — Telephone Encounter (Signed)
 Pharmacy Patient Advocate Encounter  Received notification from [employer] that Prior Authorization for FreeStyle Libre 3 Plus Sensor has been APPROVED from 06/03/2024 to 06/02/2025.   PA #/Case ID/Reference #: [id number]

## 2024-08-11 ENCOUNTER — Ambulatory Visit: Payer: Self-pay | Admitting: Family Medicine
